# Patient Record
Sex: Female | Born: 1940
Health system: Southern US, Community
[De-identification: ages and names within clinical notes are randomized; demographics above are authoritative.]

## PROBLEM LIST (undated history)

## (undated) DIAGNOSIS — C4492 Squamous cell carcinoma of skin, unspecified: Secondary | ICD-10-CM

## (undated) DIAGNOSIS — K589 Irritable bowel syndrome without diarrhea: Secondary | ICD-10-CM

## (undated) DIAGNOSIS — N814 Uterovaginal prolapse, unspecified: Secondary | ICD-10-CM

## (undated) DIAGNOSIS — G629 Polyneuropathy, unspecified: Secondary | ICD-10-CM

## (undated) DIAGNOSIS — I4891 Unspecified atrial fibrillation: Secondary | ICD-10-CM

## (undated) DIAGNOSIS — T7840XA Allergy, unspecified, initial encounter: Secondary | ICD-10-CM

## (undated) DIAGNOSIS — H269 Unspecified cataract: Secondary | ICD-10-CM

## (undated) DIAGNOSIS — M1990 Unspecified osteoarthritis, unspecified site: Secondary | ICD-10-CM

## (undated) DIAGNOSIS — D229 Melanocytic nevi, unspecified: Secondary | ICD-10-CM

## (undated) DIAGNOSIS — E785 Hyperlipidemia, unspecified: Secondary | ICD-10-CM

## (undated) DIAGNOSIS — IMO0001 Reserved for inherently not codable concepts without codable children: Secondary | ICD-10-CM

## (undated) DIAGNOSIS — R896 Abnormal cytological findings in specimens from other organs, systems and tissues: Secondary | ICD-10-CM

## (undated) DIAGNOSIS — N289 Disorder of kidney and ureter, unspecified: Secondary | ICD-10-CM

## (undated) DIAGNOSIS — C801 Malignant (primary) neoplasm, unspecified: Secondary | ICD-10-CM

## (undated) DIAGNOSIS — K219 Gastro-esophageal reflux disease without esophagitis: Secondary | ICD-10-CM

## (undated) DIAGNOSIS — K449 Diaphragmatic hernia without obstruction or gangrene: Secondary | ICD-10-CM

## (undated) DIAGNOSIS — K5790 Diverticulosis of intestine, part unspecified, without perforation or abscess without bleeding: Secondary | ICD-10-CM

## (undated) DIAGNOSIS — I10 Essential (primary) hypertension: Secondary | ICD-10-CM

## (undated) DIAGNOSIS — K52832 Lymphocytic colitis: Secondary | ICD-10-CM

## (undated) HISTORY — DX: Essential (primary) hypertension: I10

## (undated) HISTORY — DX: Uterovaginal prolapse, unspecified: N81.4

## (undated) HISTORY — DX: Allergy, unspecified, initial encounter: T78.40XA

## (undated) HISTORY — PX: BREAST BIOPSY: SHX20

## (undated) HISTORY — DX: Malignant (primary) neoplasm, unspecified: C80.1

## (undated) HISTORY — DX: Hyperlipidemia, unspecified: E78.5

## (undated) HISTORY — DX: Reserved for inherently not codable concepts without codable children: IMO0001

## (undated) HISTORY — DX: Unspecified cataract: H26.9

## (undated) HISTORY — DX: Unspecified osteoarthritis, unspecified site: M19.90

## (undated) HISTORY — DX: Lymphocytic colitis: K52.832

## (undated) HISTORY — DX: Unspecified atrial fibrillation: I48.91

## (undated) HISTORY — DX: Disorder of kidney and ureter, unspecified: N28.9

## (undated) HISTORY — DX: Gastro-esophageal reflux disease without esophagitis: K21.9

## (undated) HISTORY — DX: Diverticulosis of intestine, part unspecified, without perforation or abscess without bleeding: K57.90

## (undated) HISTORY — DX: Diaphragmatic hernia without obstruction or gangrene: K44.9

## (undated) HISTORY — DX: Polyneuropathy, unspecified: G62.9

## (undated) HISTORY — DX: Irritable bowel syndrome, unspecified: K58.9

## (undated) HISTORY — DX: Abnormal cytological findings in specimens from other organs, systems and tissues: R89.6

## (undated) HISTORY — PX: CATARACT EXTRACTION W/ INTRAOCULAR LENS IMPLANT: SHX1309

---
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
## 1998-08-23 ENCOUNTER — Other Ambulatory Visit: Admission: RE | Admit: 1998-08-23 | Discharge: 1998-08-23 | Payer: Self-pay | Admitting: Gynecology

## 2000-05-30 ENCOUNTER — Encounter: Payer: Self-pay | Admitting: Gynecology

## 2000-05-30 ENCOUNTER — Encounter: Admission: RE | Admit: 2000-05-30 | Discharge: 2000-05-30 | Payer: Self-pay | Admitting: Gynecology

## 2002-02-10 ENCOUNTER — Encounter: Payer: Self-pay | Admitting: Gynecology

## 2002-02-10 ENCOUNTER — Encounter: Admission: RE | Admit: 2002-02-10 | Discharge: 2002-02-10 | Payer: Self-pay | Admitting: Gynecology

## 2002-02-24 DIAGNOSIS — C4492 Squamous cell carcinoma of skin, unspecified: Secondary | ICD-10-CM

## 2002-02-24 HISTORY — DX: Squamous cell carcinoma of skin, unspecified: C44.92

## 2002-03-17 ENCOUNTER — Other Ambulatory Visit: Admission: RE | Admit: 2002-03-17 | Discharge: 2002-03-17 | Payer: Self-pay | Admitting: Gynecology

## 2002-08-12 ENCOUNTER — Encounter: Payer: Self-pay | Admitting: Gynecology

## 2002-08-12 ENCOUNTER — Encounter: Admission: RE | Admit: 2002-08-12 | Discharge: 2002-08-12 | Payer: Self-pay | Admitting: Gynecology

## 2003-04-01 ENCOUNTER — Other Ambulatory Visit: Admission: RE | Admit: 2003-04-01 | Discharge: 2003-04-01 | Payer: Self-pay | Admitting: Gynecology

## 2003-04-01 ENCOUNTER — Encounter: Payer: Self-pay | Admitting: Gynecology

## 2003-04-01 ENCOUNTER — Encounter: Admission: RE | Admit: 2003-04-01 | Discharge: 2003-04-01 | Payer: Self-pay | Admitting: Gynecology

## 2003-12-03 ENCOUNTER — Ambulatory Visit (HOSPITAL_COMMUNITY): Admission: RE | Admit: 2003-12-03 | Discharge: 2003-12-03 | Payer: Self-pay | Admitting: Family Medicine

## 2004-04-07 ENCOUNTER — Other Ambulatory Visit: Admission: RE | Admit: 2004-04-07 | Discharge: 2004-04-07 | Payer: Self-pay | Admitting: Gynecology

## 2004-04-07 ENCOUNTER — Encounter: Admission: RE | Admit: 2004-04-07 | Discharge: 2004-04-07 | Payer: Self-pay | Admitting: Gynecology

## 2004-05-09 ENCOUNTER — Encounter: Admission: RE | Admit: 2004-05-09 | Discharge: 2004-05-09 | Payer: Self-pay

## 2004-07-20 ENCOUNTER — Other Ambulatory Visit: Admission: RE | Admit: 2004-07-20 | Discharge: 2004-07-20 | Payer: Self-pay | Admitting: Gynecology

## 2005-03-23 ENCOUNTER — Other Ambulatory Visit: Admission: RE | Admit: 2005-03-23 | Discharge: 2005-03-23 | Payer: Self-pay | Admitting: Gynecology

## 2005-05-24 ENCOUNTER — Ambulatory Visit (HOSPITAL_COMMUNITY): Admission: RE | Admit: 2005-05-24 | Discharge: 2005-05-24 | Payer: Self-pay | Admitting: Gynecology

## 2005-08-28 DIAGNOSIS — K52832 Lymphocytic colitis: Secondary | ICD-10-CM

## 2005-08-28 HISTORY — DX: Lymphocytic colitis: K52.832

## 2005-09-20 ENCOUNTER — Other Ambulatory Visit: Admission: RE | Admit: 2005-09-20 | Discharge: 2005-09-20 | Payer: Self-pay | Admitting: Gynecology

## 2005-12-08 ENCOUNTER — Ambulatory Visit: Payer: Self-pay | Admitting: Gastroenterology

## 2005-12-29 ENCOUNTER — Ambulatory Visit: Payer: Self-pay | Admitting: Gastroenterology

## 2006-01-01 ENCOUNTER — Ambulatory Visit: Payer: Self-pay | Admitting: Gastroenterology

## 2006-02-07 ENCOUNTER — Ambulatory Visit: Payer: Self-pay | Admitting: Gastroenterology

## 2006-06-26 ENCOUNTER — Other Ambulatory Visit: Admission: RE | Admit: 2006-06-26 | Discharge: 2006-06-26 | Payer: Self-pay | Admitting: Gynecology

## 2006-06-26 ENCOUNTER — Ambulatory Visit (HOSPITAL_COMMUNITY): Admission: RE | Admit: 2006-06-26 | Discharge: 2006-06-26 | Payer: Self-pay | Admitting: Gynecology

## 2006-12-03 ENCOUNTER — Other Ambulatory Visit: Admission: RE | Admit: 2006-12-03 | Discharge: 2006-12-03 | Payer: Self-pay | Admitting: Gynecology

## 2007-05-02 DIAGNOSIS — D229 Melanocytic nevi, unspecified: Secondary | ICD-10-CM

## 2007-05-02 HISTORY — DX: Melanocytic nevi, unspecified: D22.9

## 2007-08-14 ENCOUNTER — Ambulatory Visit (HOSPITAL_COMMUNITY): Admission: RE | Admit: 2007-08-14 | Discharge: 2007-08-14 | Payer: Self-pay | Admitting: Gynecology

## 2008-12-28 ENCOUNTER — Telehealth: Payer: Self-pay | Admitting: Gastroenterology

## 2009-01-06 ENCOUNTER — Ambulatory Visit (HOSPITAL_COMMUNITY): Admission: RE | Admit: 2009-01-06 | Discharge: 2009-01-06 | Payer: Self-pay | Admitting: Gynecology

## 2010-02-21 ENCOUNTER — Ambulatory Visit (HOSPITAL_COMMUNITY): Admission: RE | Admit: 2010-02-21 | Discharge: 2010-02-21 | Payer: Self-pay | Admitting: Family Medicine

## 2010-09-29 NOTE — Progress Notes (Signed)
 Summary: REcall Colon  Phone Note From Other Clinic   Call For:   Caller: [facility] [phone] Call For: DR Quantasia Stegner Summary of Call: When is Recall Colon due? Chart requested from central files. Initial call taken by: Dyane Lerner [facility],  Dec 28, 2008 9:10 AM  Follow-up for Phone Call        left message for frances to call back  Follow-up by: Leisha Kowalk CMA,  Dec 28, 2008 11:54 AM  Additional Follow-up for Phone Call Additional follow up Details #1::        Dr. Jakie reviewed the chart and would like the patient to have an office visit to decide if she needs colon Additional Follow-up by: Chick Cardinal CMA,  Dec 28, 2008 1:25 PM    Additional Follow-up for Phone Call Additional follow up Details #2::    advised Vana that I will call the patient and make an appt. I called the patient and asked for her to make an appt, and she said she would call back to make one.  Follow-up by: Leisha Kowalk CMA,  Dec 28, 2008 3:59 PM

## 2011-04-21 ENCOUNTER — Other Ambulatory Visit: Payer: Self-pay | Admitting: Gynecology

## 2011-05-29 ENCOUNTER — Encounter: Payer: Self-pay | Admitting: Gastroenterology

## 2011-06-27 ENCOUNTER — Encounter: Payer: Self-pay | Admitting: Gastroenterology

## 2011-06-27 ENCOUNTER — Ambulatory Visit (INDEPENDENT_AMBULATORY_CARE_PROVIDER_SITE_OTHER): Payer: MEDICARE | Admitting: Gastroenterology

## 2011-06-27 DIAGNOSIS — K5900 Constipation, unspecified: Secondary | ICD-10-CM

## 2011-06-27 DIAGNOSIS — R1310 Dysphagia, unspecified: Secondary | ICD-10-CM

## 2011-06-27 NOTE — Progress Notes (Signed)
History of Present Illness:  This is a 70 year old Caucasian female who has chronic gas and bloating and progressive constipation without melena or hematochezia. She complains of increasing acid reflux with intermittent solid food dysphagia. Last endoscopy and colonoscopy 2007. Actually that time she had refractory diarrhea and a diagnosis of lymphocytic colitis. She denies hepatobiliary or systemic complaints. She takes Zantac 150 mg a day for gas and bloating. For constipation she uses stool softeners, Metamucil, and when necessary milk of magnesia. He has vague abdominal discomfort associated with gas and bloating. I have reviewed this patient's present history, medical and surgical past history, allergies and medications.     ROS: The remainder of the 10 point ROS is negative     Physical Exam: General well developed well nourished patient in no acute distress, appearing their stated age Eyes PERRLA, no icterus, fundoscopic exam per opthamologist Skin no lesions noted Neck supple, no adenopathy, no thyroid enlargement, no tenderness Chest clear to percussion and auscultation Heart no significant murmurs, gallops or rubs noted Abdomen no hepatosplenomegaly masses or tenderness, BS normal.  Extremities no acute joint lesions, edema, phlebitis or evidence of cellulitis. Neurologic patient oriented x 3, cranial nerves intact, no focal neurologic deficits noted. Psychological mental status normal and normal affect.  Assessment and plan: Constipation predominant IBS, and chronic GERD with probable peptic stricture the esophagus. She is due for followup colonoscopy exam which has been scheduled at her convenience. I reviewed her constipation regime with this patient and encourage liberal by mouth fluids. Because of her dysphagia we will proceed with endoscopy and possible dilation. I have changed her to Nexium 40 mg a day. I have no labs on record to review. Encounter Diagnoses  Name Primary?    . Constipation   . Dysphagia

## 2011-06-27 NOTE — Patient Instructions (Addendum)
Your Colonoscopy/Endoscopy is scheduled on 07/10/2011 at 11am We are giving you Nexium samples today in place of Zantac  Colonoscopy A colonoscopy is an exam to evaluate your entire colon. In this exam, your colon is cleansed. A long fiberoptic tube is inserted through your rectum and into your colon. The fiberoptic scope (endoscope) is a long bundle of enclosed and very flexible fibers. These fibers transmit light to the area examined and send images from that area to your caregiver. Discomfort is usually minimal. You may be given a drug to help you sleep (sedative) during or prior to the procedure. This exam helps to detect lumps (tumors), polyps, inflammation, and areas of bleeding. Your caregiver may also take a small piece of tissue (biopsy) that will be examined under a microscope. LET YOUR CAREGIVER KNOW ABOUT:   Allergies to food or medicine.   Medicines taken, including vitamins, herbs, eyedrops, over-the-counter medicines, and creams.   Use of steroids (by mouth or creams).   Previous problems with anesthetics or numbing medicines.   History of bleeding problems or blood clots.   Previous surgery.   Other health problems, including diabetes and kidney problems.   Possibility of pregnancy, if this applies.  BEFORE THE PROCEDURE   A clear liquid diet may be required for 2 days before the exam.   Ask your caregiver about changing or stopping your regular medications.   Liquid injections (enemas) or laxatives may be required.   A large amount of electrolyte solution may be given to you to drink over a short period of time. This solution is used to clean out your colon.   You should be present 60 minutes prior to your procedure or as directed by your caregiver.  AFTER THE PROCEDURE   If you received a sedative or pain relieving medication, you will need to arrange for someone to drive you home.   Occasionally, there is a little blood passed with the first bowel movement. Do  not be concerned.  FINDING OUT THE RESULTS OF YOUR TEST Not all test results are available during your visit. If your test results are not back during the visit, make an appointment with your caregiver to find out the results. Do not assume everything is normal if you have not heard from your caregiver or the medical facility. It is important for you to follow up on all of your test results. HOME CARE INSTRUCTIONS   It is not unusual to pass moderate amounts of gas and experience mild abdominal cramping following the procedure. This is due to air being used to inflate your colon during the exam. Walking or a warm pack on your belly (abdomen) may help.   You may resume all normal meals and activities after sedatives and medicines have worn off.   Only take over-the-counter or prescription medicines for pain, discomfort, or fever as directed by your caregiver. Do not use aspirin or blood thinners if a biopsy was taken. Consult your caregiver for medicine usage if biopsies were taken.  SEEK IMMEDIATE MEDICAL CARE IF:   You have a fever.   You pass large blood clots or fill a toilet with blood following the procedure. This may also occur 10 to 14 days following the procedure. This is more likely if a biopsy was taken.   You develop abdominal pain that keeps getting worse and cannot be relieved with medicine.  Document Released: 08/11/2000 Document Revised: 04/26/2011 Document Reviewed: 03/26/2008 [facility] Patient Information 2012 [place], [place].

## 2011-07-03 ENCOUNTER — Other Ambulatory Visit: Payer: Self-pay | Admitting: Gastroenterology

## 2011-07-03 MED ORDER — PEG-KCL-NACL-NASULF-NA ASC-C 100 G PO SOLR: 1.0000 | Freq: Once | ORAL | Status: DC

## 2011-07-03 NOTE — Telephone Encounter (Signed)
Pt reports no one called in her prep. She reports she has all the instructions; sent script to [facility] in [place].

## 2011-07-10 ENCOUNTER — Ambulatory Visit (AMBULATORY_SURGERY_CENTER): Payer: Medicare Other | Admitting: Gastroenterology

## 2011-07-10 ENCOUNTER — Encounter: Payer: Self-pay | Admitting: Gastroenterology

## 2011-07-10 DIAGNOSIS — K5730 Diverticulosis of large intestine without perforation or abscess without bleeding: Secondary | ICD-10-CM | POA: Insufficient documentation

## 2011-07-10 DIAGNOSIS — Z1211 Encounter for screening for malignant neoplasm of colon: Secondary | ICD-10-CM | POA: Insufficient documentation

## 2011-07-10 DIAGNOSIS — K219 Gastro-esophageal reflux disease without esophagitis: Secondary | ICD-10-CM | POA: Insufficient documentation

## 2011-07-10 DIAGNOSIS — K222 Esophageal obstruction: Secondary | ICD-10-CM

## 2011-07-10 DIAGNOSIS — K5900 Constipation, unspecified: Secondary | ICD-10-CM | POA: Insufficient documentation

## 2011-07-10 DIAGNOSIS — R1310 Dysphagia, unspecified: Secondary | ICD-10-CM | POA: Insufficient documentation

## 2011-07-10 MED ORDER — SODIUM CHLORIDE 0.9 % IV SOLN: 500.0000 mL | INTRAVENOUS | Status: DC

## 2011-07-10 NOTE — Patient Instructions (Signed)
Please follow discharge instructions given today. See handouts. Try to follow high fiber diet with liberal fluid diet. Should use a fiber supplement as directed (metamucil or benefiber). Resume current medications. Call us with any questions or concerns. We will call you tomorrow to check on you.

## 2011-07-11 ENCOUNTER — Telehealth: Payer: Self-pay | Admitting: *Deleted

## 2011-07-11 NOTE — Telephone Encounter (Signed)

## 2012-01-02 ENCOUNTER — Other Ambulatory Visit (HOSPITAL_COMMUNITY): Payer: Self-pay | Admitting: Gynecology

## 2012-01-02 DIAGNOSIS — Z1231 Encounter for screening mammogram for malignant neoplasm of breast: Secondary | ICD-10-CM

## 2012-04-03 ENCOUNTER — Other Ambulatory Visit (HOSPITAL_COMMUNITY): Payer: Self-pay | Admitting: Gynecology

## 2012-04-03 DIAGNOSIS — Z1231 Encounter for screening mammogram for malignant neoplasm of breast: Secondary | ICD-10-CM

## 2012-04-23 ENCOUNTER — Other Ambulatory Visit: Payer: Self-pay | Admitting: Gynecology

## 2012-04-23 ENCOUNTER — Ambulatory Visit (HOSPITAL_COMMUNITY)
Admission: RE | Admit: 2012-04-23 | Discharge: 2012-04-23 | Disposition: A | Discharge status: Home / Self Care | Payer: Medicare Other | Source: Ambulatory Visit | Attending: Gynecology | Admitting: Gynecology

## 2012-04-23 DIAGNOSIS — Z1231 Encounter for screening mammogram for malignant neoplasm of breast: Secondary | ICD-10-CM | POA: Insufficient documentation

## 2012-10-28 ENCOUNTER — Telehealth: Payer: Self-pay | Admitting: Gastroenterology

## 2012-10-28 NOTE — Telephone Encounter (Signed)
Pt reports diarrhea x 3 weeks, she kept thinking it would go away, but it hasn't. Normally, she has to take something to have a BM. She states she had the same problem in 2007 and she states she took Entocort.  Last ECL 07/10/11 showing GERD and COLON moderate diverticulosis and melanosis. Pt denies being on an AB, reports no blood in her stool or fever, but she does report cramping and urgency. A week ago, Sunday, she states the watery stool was "black looking". Pt given an appt with Mike Gip, PA for 10/30/12 d/t the weather forecast per pt.

## 2012-10-30 ENCOUNTER — Encounter: Payer: Self-pay | Admitting: Physician Assistant

## 2012-10-30 ENCOUNTER — Other Ambulatory Visit: Payer: Medicare Other

## 2012-10-30 ENCOUNTER — Ambulatory Visit (INDEPENDENT_AMBULATORY_CARE_PROVIDER_SITE_OTHER): Payer: Medicare Other | Admitting: Physician Assistant

## 2012-10-30 VITALS — BP 100/64 | HR 88 | Ht 65.0 in | Wt 156.4 lb

## 2012-10-30 DIAGNOSIS — K625 Hemorrhage of anus and rectum: Secondary | ICD-10-CM

## 2012-10-30 DIAGNOSIS — R197 Diarrhea, unspecified: Secondary | ICD-10-CM

## 2012-10-30 DIAGNOSIS — K52832 Lymphocytic colitis: Secondary | ICD-10-CM

## 2012-10-30 DIAGNOSIS — K52839 Microscopic colitis, unspecified: Secondary | ICD-10-CM | POA: Insufficient documentation

## 2012-10-30 DIAGNOSIS — K5289 Other specified noninfective gastroenteritis and colitis: Secondary | ICD-10-CM

## 2012-10-30 DIAGNOSIS — E785 Hyperlipidemia, unspecified: Secondary | ICD-10-CM | POA: Insufficient documentation

## 2012-10-30 MED ORDER — BUDESONIDE 3 MG PO CP24: ORAL_CAPSULE | ORAL | Status: DC

## 2012-10-30 MED ORDER — PREDNISONE 20 MG PO TABS: ORAL_TABLET | ORAL | Status: DC

## 2012-10-30 NOTE — Progress Notes (Signed)
Subjective:    Patient ID: Kimberly Suarez, female    DOB: August 06, 1941, 72 y.o.   MRN: [id number]  HPI Kimberly Suarez is a very nice 72 year old white female known to Dr. Jarold Motto who has been followed for GERD, constipation predominant IBS and diverticulosis. She also has history of lymphocytic colitis which was documented and treated in 2007 . She responded well to Entocort at that time She comes to the office now complaining of onset of diarrhea about 3 weeks ago. She says her symptoms have not been progressive but are persistent. She's having one to 2  Watery, malodorous, greenish stools per day. She has had urgency and mild cramping just before bowel movements. Her appetite has been fair but she has lost about 8-10 pounds. She has had some vague nausea but no vomiting and no fevers. Her energy level has definitely been decreased. She's not been not any new medications, supplements or  vitamins and has not had any antibiotics in many many months. No other family members have been ill. Last colonoscopy was done in November of 2012 and showed moderate diverticulosis, melanosis coli, no polyps.    Review of Systems  Constitutional: Positive for appetite change, fatigue and unexpected weight change.  HENT: Negative.   Eyes: Negative.   Respiratory: Negative.   Cardiovascular: Negative.   Gastrointestinal: Positive for abdominal pain and diarrhea.  Endocrine: Negative.   Genitourinary: Negative.   Musculoskeletal: Negative.   Skin: Negative.   Allergic/Immunologic: Negative.   Neurological: Negative.   Hematological: Negative.   Psychiatric/Behavioral: Negative.    Outpatient Prescriptions Prior to Visit  Medication Sig Dispense Refill  . aspirin 81 MG tablet Take 81 mg by mouth daily.        Marland Kitchen atorvastatin (LIPITOR) 40 MG tablet Take 40 mg by mouth daily.        . Calcium Citrate (CITRACAL PO) Take 1 tablet by mouth daily.        . Cholecalciferol (VITAMIN D3) 1000 UNITS CAPS Take 1 capsule by  mouth daily.        Tery Sanfilippo Calcium (STOOL SOFTENER PO) Take by mouth as needed.        . enalapril (VASOTEC) 10 MG tablet Take 10 mg by mouth 2 (two) times daily.        Marland Kitchen esomeprazole (NEXIUM) 40 MG capsule Take 40 mg by mouth daily before breakfast.        . Glucosamine-Chondroitin (GLUCOSAMINE CHONDR COMPLEX PO) Take 1 tablet by mouth daily.        . hydrochlorothiazide (HYDRODIURIL) 25 MG tablet Take 25 mg by mouth daily.        . Omega-3 Fatty Acids (FISH OIL) 1000 MG CAPS Take 1 capsule by mouth daily.        . ranitidine (ZANTAC) 150 MG capsule Take 150 mg by mouth as needed.         No facility-administered medications prior to visit.   No Known Allergies Patient Active Problem List  Diagnosis  . Unspecified constipation  . Dysphagia, unspecified  . Diverticulosis of colon (without mention of hemorrhage)  . Special screening for malignant neoplasms, colon  . GERD with stricture  . Lymphocytic colitis  . Other and unspecified hyperlipidemia   History  Substance Use Topics  . Smoking status: Never Smoker   . Smokeless tobacco: Never Used  . Alcohol Use: No   family history includes Diabetes in her brother and Heart disease in her mother and unspecified family member.  There  is no history of Colon cancer, and Kidney disease, and Liver disease, .     Objective:   Physical Exam  well-developed older white female in no acute distress, pleasant blood pressure 100/64 pulse 88 height 5 foot 5 weight 156. HEENT; nontraumatic normocephalic EOMI PERRLA sclera anicteric,Neck; Supple no JVD, Cardiovascular, regular rate and rhythm with S1-S2 no murmur or gallop, Pulmonary; clear bilaterally, Abdomen; soft mildly tender bilaterally in the lower quadrants is no guarding or rebound no palpable mass or hepatosplenomegaly bowel sounds are active, Rectal; exam not done, Extremities; no clubbing, cyanosis, or edema skin warm and dry, Psych; mood and affect normal and appropriate.         Assessment & Plan:  #61  72 year old female with history of constipation predominant IBS and remote history of lymphocytic colitis diagnosed in 2007. Patient now presents with 3 week history of diarrhea and mild lower Donald cramp as well as associated weight loss and fatigue. Will rule out infectious colitis versus exacerbation of lymphocytic colitis. #2 GERD #3 diverticulosis  Plan; check stool for C. difficile by PCR, culture and O&P Start Prednisone 20 mg daily qam x 2 weeks then decrease to 10 mg daily- attempted to start Entocort but would cost pt 350.00 per month..... Offered an anti-spasmodic but she declines at this time Plan office followup with Dr. Jarold Motto or myself in 2 weeks

## 2012-10-30 NOTE — Patient Instructions (Addendum)
Please go to the basement level to the lab for stool studies. We will call you with the results. Take Prednisone 20 mg, Take 1 tab every morning for 14 days. Then take 1/2 tab daily until your visit with Dr. Jarold Motto.  We sent the prescription to [facility].   See Dr. Jarold Motto on 11-19-2012 at 1:45 PM.

## 2012-10-31 LAB — CLOSTRIDIUM DIFFICILE BY PCR: Toxigenic C. Difficile by PCR: NOT DETECTED

## 2012-10-31 LAB — OVA AND PARASITE SCREEN

## 2012-11-03 LAB — STOOL CULTURE

## 2012-11-14 ENCOUNTER — Telehealth: Payer: Self-pay | Admitting: Gastroenterology

## 2012-11-14 NOTE — Telephone Encounter (Signed)
Spoke with Mike Gip, PA because pt states she lives over an hour away and can't afford to come in if she doesn't have to. Amy instructed pt to continue with 10 mg Prednisone for a total of 2 weeks/14 days and then decrease to 5 mg for 14 days and then stop. If her s&s return at any time, she is to call in and she will have to be seen. lmom for pt to call back.

## 2012-11-14 NOTE — Telephone Encounter (Signed)
Pt states she felt better after the 3rd day of Prednisone. Does she have to come in?

## 2012-11-15 MED ORDER — PREDNISONE 10 MG PO TABS: ORAL_TABLET | ORAL | Status: DC

## 2012-11-15 NOTE — Telephone Encounter (Signed)
Informed pt of tapering dose, will order more prednisone, and she is to call if any s&s reoccur. Cancelled appt; pt stated understanding.

## 2012-11-18 ENCOUNTER — Other Ambulatory Visit: Payer: Self-pay | Admitting: *Deleted

## 2012-11-18 NOTE — Progress Notes (Signed)
Please ask pt to stay on 20 mg x 2 weeks total, then decrease to 15 mg daily x one week then 10 mg daily x one week then may stop if no sxs-if her sxs start coming back she needs to call and come back in to discuss a maintanence regimen   ----- Message -----   From: Linna Hoff, RN   Sent: 11/14/2012 10:56 AM   To: Sammuel Cooper, PA-C      Amy, you saw this lady on 10/30/12 and put her prednisone and wrote to f/u with Jarold Motto. Pt states she was better after 3 days and doesn't want to f/u, but I told her she would have to taper off the prednisone. Will you tell me how or should I make her come in? Thanks.

## 2012-11-19 ENCOUNTER — Ambulatory Visit: Payer: Medicare Other | Admitting: Gastroenterology

## 2012-12-02 ENCOUNTER — Other Ambulatory Visit (INDEPENDENT_AMBULATORY_CARE_PROVIDER_SITE_OTHER): Payer: Medicare Other

## 2012-12-02 DIAGNOSIS — R5383 Other fatigue: Secondary | ICD-10-CM

## 2012-12-02 DIAGNOSIS — R5381 Other malaise: Secondary | ICD-10-CM

## 2012-12-02 DIAGNOSIS — E785 Hyperlipidemia, unspecified: Secondary | ICD-10-CM

## 2012-12-02 DIAGNOSIS — I10 Essential (primary) hypertension: Secondary | ICD-10-CM

## 2012-12-02 DIAGNOSIS — R7989 Other specified abnormal findings of blood chemistry: Secondary | ICD-10-CM

## 2012-12-02 DIAGNOSIS — E559 Vitamin D deficiency, unspecified: Secondary | ICD-10-CM

## 2012-12-02 LAB — POCT CBC
Granulocyte percent: 51.3 % (ref 37–80)
HCT, POC: 39.6 % (ref 37.7–47.9)
Hemoglobin: 13.3 g/dL (ref 12.2–16.2)
Lymph, poc: 2.9 (ref 0.6–3.4)
MCH, POC: 30.3 pg (ref 27–31.2)
MCHC: 33.6 g/dL (ref 31.8–35.4)
MCV: 90.3 fL (ref 80–97)
MPV: 7.3 fL (ref 0–99.8)
POC Granulocyte: 3.6 (ref 2–6.9)
POC LYMPH PERCENT: 41 % (ref 10–50)
Platelet Count, POC: 177 10*3/uL (ref 142–424)
RBC: 4.4 M/uL (ref 4.04–5.48)
RDW, POC: 13.9 %
WBC: 7 10*3/uL (ref 4.6–10.2)

## 2012-12-02 LAB — HEPATIC FUNCTION PANEL
ALT: 21 U/L (ref 0–35)
AST: 28 U/L (ref 0–37)
Albumin: 3.8 g/dL (ref 3.5–5.2)
Alkaline Phosphatase: 31 U/L — ABNORMAL LOW (ref 39–117)
Bilirubin, Direct: 0.1 mg/dL (ref 0.0–0.3)
Indirect Bilirubin: 0.3 mg/dL (ref 0.0–0.9)
Total Bilirubin: 0.4 mg/dL (ref 0.3–1.2)
Total Protein: 6.2 g/dL (ref 6.0–8.3)

## 2012-12-02 LAB — BASIC METABOLIC PANEL WITH GFR
BUN: 19 mg/dL (ref 6–23)
CO2: 32 meq/L (ref 19–32)
Calcium: 9.2 mg/dL (ref 8.4–10.5)
Chloride: 101 meq/L (ref 96–112)
Creat: 1.28 mg/dL — ABNORMAL HIGH (ref 0.50–1.10)
Glucose, Bld: 99 mg/dL (ref 70–99)
Potassium: 3.7 meq/L (ref 3.5–5.3)
Sodium: 139 meq/L (ref 135–145)

## 2012-12-03 LAB — NMR LIPOPROFILE WITH LIPIDS
Cholesterol, Total: 138 mg/dL
HDL Particle Number: 33.3 umol/L
HDL Size: 9 nm — ABNORMAL LOW
HDL-C: 50 mg/dL
LDL (calc): 68 mg/dL
LDL Particle Number: 868 nmol/L
LDL Size: 20.7 nm
LP-IR Score: 47 — ABNORMAL HIGH
Large HDL-P: 6.8 umol/L
Large VLDL-P: 2.3 nmol/L
Small LDL Particle Number: 328 nmol/L
Triglycerides: 102 mg/dL
VLDL Size: 49.2 nm — ABNORMAL HIGH

## 2012-12-03 LAB — VITAMIN D 25 HYDROXY (VIT D DEFICIENCY, FRACTURES): Vit D, 25-Hydroxy: 51 ng/mL (ref 30–89)

## 2012-12-04 NOTE — Progress Notes (Signed)
Patients came in for labs only

## 2012-12-09 ENCOUNTER — Ambulatory Visit: Payer: Self-pay | Admitting: Family Medicine

## 2013-01-01 ENCOUNTER — Encounter: Payer: Self-pay | Admitting: Family Medicine

## 2013-01-01 ENCOUNTER — Ambulatory Visit (INDEPENDENT_AMBULATORY_CARE_PROVIDER_SITE_OTHER): Payer: Medicare Other | Admitting: Family Medicine

## 2013-01-01 VITALS — BP 100/61 | HR 67 | Temp 97.2°F | Ht 63.5 in | Wt 156.8 lb

## 2013-01-01 DIAGNOSIS — K52832 Lymphocytic colitis: Secondary | ICD-10-CM

## 2013-01-01 DIAGNOSIS — E785 Hyperlipidemia, unspecified: Secondary | ICD-10-CM

## 2013-01-01 DIAGNOSIS — K5289 Other specified noninfective gastroenteritis and colitis: Secondary | ICD-10-CM

## 2013-01-01 DIAGNOSIS — K5900 Constipation, unspecified: Secondary | ICD-10-CM

## 2013-01-01 NOTE — Progress Notes (Signed)
  Subjective:    Patient ID: Kimberly Suarez, female    DOB: 07-Oct-1940, 72 y.o.   MRN: [id number]  HPI This patient presents for recheck of multiple medical problems. No one accompanies the patient today.  Patient Active Problem List   Diagnosis Date Noted  . Lymphocytic colitis 10/30/2012  . Other and unspecified hyperlipidemia 10/30/2012  . Unspecified constipation 07/10/2011  . Dysphagia, unspecified 07/10/2011  . Diverticulosis of colon (without mention of hemorrhage) 07/10/2011  . Special screening for malignant neoplasms, colon 07/10/2011  . GERD with stricture 07/10/2011    In addition, See Ros  The allergies, current medications, past medical history, surgical history, family and social history are reviewed.  Immunizations reviewed.  Health maintenance reviewed.  The following items are outstanding: None.      Review of Systems  Constitutional: Negative.   HENT: Positive for postnasal drip.   Eyes: Positive for itching (due to dryness).  Respiratory: Negative.   Cardiovascular: Negative.   Gastrointestinal: Positive for abdominal distention (bloating at night).  Genitourinary: Negative.   Musculoskeletal: Positive for back pain (LBP occasional) and arthralgias (knees).  Allergic/Immunologic: Positive for environmental allergies (seasonal).  Psychiatric/Behavioral: Positive for sleep disturbance (2-3 x week).   Recent labs were reviewed with patient.    Objective:   Physical Exam BP 100/61  Pulse 67  Temp(Src) 97.2 F (36.2 C) (Oral)  Ht 5' 3.5" (1.613 m)  Wt 156 lb 12.8 oz (71.124 kg)  BMI 27.34 kg/m2  The patient appeared well nourished and normally developed, alert and oriented to time and place. Speech, behavior and judgement appear normal. Vital signs as documented.  Head exam is unremarkable. No scleral icterus or pallor noted. Nasal congestion bilaterally left greater than right. Mouth normal. Throat normal.  Neck is without jugular venous  distension, thyromegally, or carotid bruits. Carotid upstrokes are brisk bilaterally. No cervical adenopathy. Lungs are clear anteriorly and posteriorly to auscultation. Normal respiratory effort. Cardiac exam reveals regular rate and rhythm at 60 per minute .First and second heart sounds normal.  No murmurs, rubs or gallops.  Abdominal exam reveals normal bowl sounds, no masses, no organomegaly and no aortic enlargement. No inguinal adenopathy. Slight epigastric tenderness. Extremities are nonedematous and both femoral and pedal pulses are normal. Skin without pallor or jaundice.  Warm and dry, without rash. Neurologic exam reveals normal deep tendon reflexes and normal sensation.          Assessment & Plan:  hyperlipidemia  Lymphocytic colitis  Unspecified constipation  Patient Instructions  Try Nasacort AQ over-the-counter 1-2 sprays each nostril one time daily Continue current meds as directed Always drink plenty of fluids Protect airways and possible from environmental allergen Dr. Scheryl Darter is the gynecologist that comes and sees patient's next door, his number is [phone]

## 2013-01-01 NOTE — Patient Instructions (Addendum)
Try Nasacort AQ over-the-counter 1-2 sprays each nostril one time daily Continue current meds as directed Always drink plenty of fluids Protect airways and possible from environmental allergen Dr. Scheryl Darter is the gynecologist that comes and sees patient's next door, his number is [phone]

## 2013-06-12 ENCOUNTER — Ambulatory Visit: Payer: Medicare Other | Admitting: Gynecology

## 2013-06-18 ENCOUNTER — Ambulatory Visit: Payer: Medicare Other | Admitting: Gynecology

## 2013-06-26 ENCOUNTER — Encounter: Payer: Self-pay | Admitting: Family Medicine

## 2013-06-26 ENCOUNTER — Ambulatory Visit (INDEPENDENT_AMBULATORY_CARE_PROVIDER_SITE_OTHER): Payer: Medicare Other

## 2013-06-26 ENCOUNTER — Encounter (INDEPENDENT_AMBULATORY_CARE_PROVIDER_SITE_OTHER): Payer: Self-pay

## 2013-06-26 ENCOUNTER — Ambulatory Visit (INDEPENDENT_AMBULATORY_CARE_PROVIDER_SITE_OTHER): Payer: Medicare Other | Admitting: Family Medicine

## 2013-06-26 VITALS — BP 114/67 | HR 69 | Temp 97.9°F | Ht 63.5 in | Wt 156.0 lb

## 2013-06-26 DIAGNOSIS — K219 Gastro-esophageal reflux disease without esophagitis: Secondary | ICD-10-CM

## 2013-06-26 DIAGNOSIS — Z780 Asymptomatic menopausal state: Secondary | ICD-10-CM

## 2013-06-26 DIAGNOSIS — E785 Hyperlipidemia, unspecified: Secondary | ICD-10-CM

## 2013-06-26 DIAGNOSIS — I10 Essential (primary) hypertension: Secondary | ICD-10-CM | POA: Insufficient documentation

## 2013-06-26 MED ORDER — HYDROCHLOROTHIAZIDE 25 MG PO TABS: 25.0000 mg | ORAL_TABLET | Freq: Every day | ORAL | Status: DC

## 2013-06-26 MED ORDER — ENALAPRIL MALEATE 10 MG PO TABS: 10.0000 mg | ORAL_TABLET | Freq: Two times a day (BID) | ORAL | Status: DC

## 2013-06-26 NOTE — Progress Notes (Signed)
Subjective:    Patient ID: Kimberly Suarez, female    DOB: March 14, 1941, 72 y.o.   MRN: [id number]  HPI Pt here for follow up and management of chronic medical problems. Patient complains today of burning in her feet especially at night time. She also complains of some cramping in the right leg. She is also having diarrhea and has seen the gastroenterologist about this in the past and she is currently taking a course of prednisone. The diagnosis for her diarrhea was lymphocytic colitis. She is also behind on getting her mammogram and pelvic exam. She will get a chest x-ray today and she gets her FOBT done with her GYN exam.     Patient Active Problem List   Diagnosis Date Noted  . Lymphocytic colitis 10/30/2012  . Other and unspecified hyperlipidemia 10/30/2012  . Unspecified constipation 07/10/2011  . Dysphagia, unspecified 07/10/2011  . Diverticulosis of colon (without mention of hemorrhage) 07/10/2011  . Special screening for malignant neoplasms, colon 07/10/2011  . GERD with stricture 07/10/2011   Outpatient Encounter Prescriptions as of 06/26/2013  Medication Sig Dispense Refill  . aspirin 81 MG tablet Take 81 mg by mouth daily.        Marland Kitchen atorvastatin (LIPITOR) 40 MG tablet Take 40 mg by mouth daily.        . Calcium Citrate (CITRACAL PO) Take 1 tablet by mouth daily.        . Cholecalciferol (VITAMIN D3) 1000 UNITS CAPS Take 1 capsule by mouth daily.        Tery Sanfilippo Calcium (STOOL SOFTENER PO) Take by mouth as needed.        . enalapril (VASOTEC) 10 MG tablet Take 10 mg by mouth 2 (two) times daily.        Marland Kitchen esomeprazole (NEXIUM) 40 MG capsule Take 40 mg by mouth daily before breakfast.        . Glucosamine-Chondroitin (GLUCOSAMINE CHONDR COMPLEX PO) Take 1 tablet by mouth 2 (two) times daily.       . hydrochlorothiazide (HYDRODIURIL) 25 MG tablet Take 25 mg by mouth daily.        . Omega-3 Fatty Acids (FISH OIL) 1000 MG CAPS Take 1 capsule by mouth 2 (two) times daily.       .  ranitidine (ZANTAC) 150 MG capsule Take 150 mg by mouth as needed.         No facility-administered encounter medications on file as of 06/26/2013.    Review of Systems  Constitutional: Negative.   HENT: Negative.   Eyes: Negative.   Respiratory: Negative.   Cardiovascular: Negative.   Gastrointestinal: Positive for diarrhea.  Endocrine: Negative.   Genitourinary: Negative.   Musculoskeletal: Negative.   Skin: Negative.   Allergic/Immunologic: Negative.   Neurological: Negative.   Hematological: Negative.   Psychiatric/Behavioral: Negative.        Objective:   Physical Exam  Nursing note and vitals reviewed. Constitutional: She is oriented to person, place, and time. She appears well-developed and well-nourished.  HENT:  Head: Normocephalic and atraumatic.  Right Ear: External ear normal.  Left Ear: External ear normal.  Nose: Nose normal.  Mouth/Throat: Oropharynx is clear and moist.  There is definitely some nasal congestion bilaterally  Eyes: Conjunctivae and EOM are normal. Pupils are equal, round, and reactive to light. Right eye exhibits no discharge. Left eye exhibits no discharge. No scleral icterus.  Neck: Normal range of motion. Neck supple. No JVD present. No thyromegaly present.  No bruits in the  neck  Cardiovascular: Normal rate, regular rhythm, normal heart sounds and intact distal pulses.  Exam reveals no gallop and no friction rub.   No murmur heard. At 84 per minute  Pulmonary/Chest: Effort normal and breath sounds normal. She has no wheezes. She has no rales. She exhibits no tenderness.  Abdominal: Soft. Bowel sounds are normal. She exhibits no mass. There is no tenderness. There is no rebound and no guarding.  Musculoskeletal: Normal range of motion. She exhibits no edema and no tenderness.  Leg raising is good bilaterally  Lymphadenopathy:    She has no cervical adenopathy.  Neurological: She is alert and oriented to person, place, and time. She has  normal reflexes. She displays normal reflexes. No cranial nerve deficit.  Skin: Skin is warm and dry. No rash noted.  Psychiatric: She has a normal mood and affect. Her behavior is normal. Judgment and thought content normal.   BP 114/67  Pulse 69  Temp(Src) 97.9 F (36.6 C) (Oral)  Ht 5' 3.5" (1.613 m)  Wt 156 lb (70.761 kg)  BMI 27.2 kg/m2        Assessment & Plan:   1. HTN (hypertension)   2. GERD with stricture   3. Other and unspecified hyperlipidemia   4. Postmenopausal    Orders Placed This Encounter  Procedures  . DG Bone Density    Wants asap, last was in 8/14    Standing Status: Future     Number of Occurrences:      Standing Expiration Date: 08/26/2014    Order Specific Question:  Reason for Exam (SYMPTOM  OR DIAGNOSIS REQUIRED)    Answer:  postmenapausal    Order Specific Question:  Preferred imaging location?    Answer:  Internal  . DG Chest 2 View    Standing Status: Future     Number of Occurrences:      Standing Expiration Date: 08/26/2014    Order Specific Question:  Reason for Exam (SYMPTOM  OR DIAGNOSIS REQUIRED)    Answer:  htn    Order Specific Question:  Preferred imaging location?    Answer:  Internal  . Hepatic function panel    Standing Status: Future     Number of Occurrences:      Standing Expiration Date: 06/26/2014  . BMP8+EGFR    Standing Status: Future     Number of Occurrences:      Standing Expiration Date: 06/26/2014  . Vit D  25 hydroxy (rtn osteoporosis monitoring)    Standing Status: Future     Number of Occurrences:      Standing Expiration Date: 06/26/2014  . NMR, lipoprofile    Standing Status: Future     Number of Occurrences:      Standing Expiration Date: 06/26/2014  . POCT CBC    Standing Status: Future     Number of Occurrences:      Standing Expiration Date: 07/27/2013   Meds ordered this encounter  Medications  . hydrochlorothiazide (HYDRODIURIL) 25 MG tablet    Sig: Take 1 tablet (25 mg total) by mouth  daily.    Dispense:  90 tablet    Refill:  1  . enalapril (VASOTEC) 10 MG tablet    Sig: Take 1 tablet (10 mg total) by mouth 2 (two) times daily.    Dispense:  180 tablet    Refill:  1   Patient Instructions  Continue to take medications as directed Do not forget to get your Pap smear and your  mammogram Return to clinic for your lab work Return to clinic for your flu shot and Prevnar shot You will return to clinic also for a DEXA scan. Always be careful and do not put yourself at risk for falling Drink plenty of fluid We will call you with the results of the chest x-ray and lab work once these results are available.    Nyra Capes MD

## 2013-06-26 NOTE — Patient Instructions (Signed)
Continue to take medications as directed Do not forget to get your Pap smear and your mammogram Return to clinic for your lab work Return to clinic for your flu shot and Prevnar shot You will return to clinic also for a DEXA scan. Always be careful and do not put yourself at risk for falling Drink plenty of fluid We will call you with the results of the chest x-ray and lab work once these results are available.

## 2013-06-27 ENCOUNTER — Other Ambulatory Visit: Payer: Self-pay | Admitting: Family Medicine

## 2013-06-27 DIAGNOSIS — Z1231 Encounter for screening mammogram for malignant neoplasm of breast: Secondary | ICD-10-CM

## 2013-06-30 ENCOUNTER — Ambulatory Visit: Payer: Medicare Other | Admitting: Gynecology

## 2013-07-02 ENCOUNTER — Ambulatory Visit (INDEPENDENT_AMBULATORY_CARE_PROVIDER_SITE_OTHER): Payer: Medicare Other | Admitting: Pharmacist

## 2013-07-02 ENCOUNTER — Ambulatory Visit (INDEPENDENT_AMBULATORY_CARE_PROVIDER_SITE_OTHER): Payer: Medicare Other

## 2013-07-02 ENCOUNTER — Encounter: Payer: Self-pay | Admitting: *Deleted

## 2013-07-02 ENCOUNTER — Encounter: Payer: Self-pay | Admitting: Pharmacist

## 2013-07-02 VITALS — Ht 64.0 in | Wt 148.0 lb

## 2013-07-02 DIAGNOSIS — K219 Gastro-esophageal reflux disease without esophagitis: Secondary | ICD-10-CM

## 2013-07-02 DIAGNOSIS — E785 Hyperlipidemia, unspecified: Secondary | ICD-10-CM

## 2013-07-02 DIAGNOSIS — Z23 Encounter for immunization: Secondary | ICD-10-CM

## 2013-07-02 DIAGNOSIS — Z13820 Encounter for screening for osteoporosis: Secondary | ICD-10-CM

## 2013-07-02 DIAGNOSIS — Z780 Asymptomatic menopausal state: Secondary | ICD-10-CM

## 2013-07-02 LAB — POCT CBC
Granulocyte percent: 72 % (ref 37–80)
HCT, POC: 44.1 % (ref 37.7–47.9)
Hemoglobin: 14.9 g/dL (ref 12.2–16.2)
Lymph, poc: 2.3 (ref 0.6–3.4)
MCH, POC: 30.6 pg (ref 27–31.2)
MCHC: 33.8 g/dL (ref 31.8–35.4)
MCV: 90.6 fL (ref 80–97)
MPV: 7.5 fL (ref 0–99.8)
POC Granulocyte: 6.5 (ref 2–6.9)
POC LYMPH PERCENT: 26.1 % (ref 10–50)
Platelet Count, POC: 222 10*3/uL (ref 142–424)
RBC: 4.9 M/uL (ref 4.04–5.48)
RDW, POC: 13.6 %
WBC: 9 10*3/uL (ref 4.6–10.2)

## 2013-07-02 NOTE — Patient Instructions (Signed)

## 2013-07-02 NOTE — Progress Notes (Signed)
Patient ID: Kimberly Suarez, female   DOB: 21-Feb-1941, 72 y.o.   MRN: [id number]   Osteoporosis Clinic Current Height: Height: 5\' 4"  (162.6 cm)      Max Lifetime Height:  5\' 4"  Current Weight: Weight: 148 lb (67.132 kg)       Ethnicity:Caucasian  BP:       HR:         HPI: Does pt already have a diagnosis of:  Osteopenia?  No Osteoporosis?  No  Back Pain?  No       Kyphosis?  No Prior fracture?  No Med(s) for Osteoporosis/Osteopenia:  none Med(s) previously tried for Osteoporosis/Osteopenia:  none                                                             PMH: Age at menopause:  72 yo Hysterectomy?  No Oophorectomy?  No HRT? Yes - Former.  Type/duration: 2 years Steroid Use?  No Thyroid med?  No History of cancer?  No History of digestive disorders (ie Crohn's)?  Yes - GERD Current or previous eating disorders?  No Last Vitamin D Result:  51 (12/02/2012) Last SCr Result:  1.28 (12/02/2012)   FH/SH: Family history of osteoporosis?  No Parent with history of hip fracture?  No Family history of breast cancer?  No Exercise?  Yes   Smoking?  No Alcohol?  No    Calcium Assessment Calcium Intake  # of servings/day  Calcium mg  Milk (8 oz) 1  x  300  = 300mg   Yogurt (4 oz) 1 x  200 = 200mg   Cheese (1 oz) 0 x  200 = 0  Other Calcium sources   250mg   Ca supplement 0 = 0   Estimated calcium intake per day 750mg     DEXA Results Date of Test T-Score for AP Spine L1-L4 T-Score for Total Left Hip T-Score for Total Right Hip  07/02/2013 1.0 0.8 1.0  04/12/2011 1.0 0.8 0.8  01/27/2009 0.7 0.7 0.8  06/11/2006 -0.1 0.7 0.5   Assessment: Normal BMD  Recommendations: 1.  Discussed results of DEXA and fracture risk 2.  continue calcium 1200mg  daily through supplementation or diet.  3.  continue weight bearing exercise - 30 minutes at least 4 days  per week.   4.  Counseled and educated about fall risk and prevention. 5.  Patient received influenza and pneumococcal vaccine  today. Recheck DEXA:  2 years  Time spent counseling patient:  10 minutes  Henrene Pastor, PharmD, CPP

## 2013-07-04 LAB — VITAMIN D 25 HYDROXY (VIT D DEFICIENCY, FRACTURES): Vit D, 25-Hydroxy: 50.8 ng/mL (ref 30.0–100.0)

## 2013-07-04 LAB — BMP8+EGFR
BUN/Creatinine Ratio: 15 (ref 11–26)
BUN: 23 mg/dL (ref 8–27)
CO2: 29 mmol/L (ref 18–29)
Calcium: 11.5 mg/dL — ABNORMAL HIGH (ref 8.6–10.2)
Chloride: 95 mmol/L — ABNORMAL LOW (ref 97–108)
Creatinine, Ser: 1.53 mg/dL — ABNORMAL HIGH (ref 0.57–1.00)
GFR calc Af Amer: 39 mL/min/{1.73_m2} — ABNORMAL LOW
GFR calc non Af Amer: 34 mL/min/{1.73_m2} — ABNORMAL LOW
Glucose: 104 mg/dL — ABNORMAL HIGH (ref 65–99)
Potassium: 4.6 mmol/L (ref 3.5–5.2)
Sodium: 142 mmol/L (ref 134–144)

## 2013-07-04 LAB — NMR, LIPOPROFILE
Cholesterol: 176 mg/dL
HDL Cholesterol by NMR: 54 mg/dL
HDL Particle Number: 35.8 umol/L
LDL Particle Number: 1892 nmol/L — ABNORMAL HIGH
LDL Size: 20.5 nm — ABNORMAL LOW
LDLC SERPL CALC-MCNC: 89 mg/dL
LP-IR Score: 47 — ABNORMAL HIGH
Small LDL Particle Number: 1080 nmol/L — ABNORMAL HIGH
Triglycerides by NMR: 167 mg/dL — ABNORMAL HIGH

## 2013-07-04 LAB — SPECIMEN STATUS REPORT

## 2013-07-04 LAB — HEPATIC FUNCTION PANEL
ALT: 21 [IU]/L (ref 0–32)
AST: 24 [IU]/L (ref 0–40)
Albumin: 4.4 g/dL (ref 3.5–4.8)
Alkaline Phosphatase: 49 [IU]/L (ref 39–117)
Bilirubin, Direct: 0.12 mg/dL (ref 0.00–0.40)
Total Bilirubin: 0.5 mg/dL (ref 0.0–1.2)
Total Protein: 7.1 g/dL (ref 6.0–8.5)

## 2013-07-10 ENCOUNTER — Other Ambulatory Visit (INDEPENDENT_AMBULATORY_CARE_PROVIDER_SITE_OTHER): Payer: Medicare Other

## 2013-07-10 DIAGNOSIS — R7989 Other specified abnormal findings of blood chemistry: Secondary | ICD-10-CM

## 2013-07-10 NOTE — Addendum Note (Signed)
Addended by: Prescott Gum on: 07/10/2013 10:25 AM   Modules accepted: Orders

## 2013-07-10 NOTE — Progress Notes (Signed)
Pt came in for labs only 

## 2013-07-11 LAB — BMP8+EGFR
BUN/Creatinine Ratio: 10 — ABNORMAL LOW (ref 11–26)
BUN: 14 mg/dL (ref 8–27)
CO2: 29 mmol/L (ref 18–29)
Calcium: 9.3 mg/dL (ref 8.6–10.2)
Chloride: 96 mmol/L — ABNORMAL LOW (ref 97–108)
Creatinine, Ser: 1.35 mg/dL — ABNORMAL HIGH (ref 0.57–1.00)
GFR calc Af Amer: 45 mL/min/{1.73_m2} — ABNORMAL LOW
GFR calc non Af Amer: 39 mL/min/{1.73_m2} — ABNORMAL LOW
Glucose: 86 mg/dL (ref 65–99)
Potassium: 3.8 mmol/L (ref 3.5–5.2)
Sodium: 141 mmol/L (ref 134–144)

## 2013-07-11 LAB — FECAL OCCULT BLOOD, IMMUNOCHEMICAL: Fecal Occult Bld: NEGATIVE

## 2013-07-15 ENCOUNTER — Encounter: Payer: Self-pay | Admitting: *Deleted

## 2013-07-15 ENCOUNTER — Encounter: Payer: Self-pay | Admitting: Gynecology

## 2013-07-15 ENCOUNTER — Ambulatory Visit (INDEPENDENT_AMBULATORY_CARE_PROVIDER_SITE_OTHER): Payer: Medicare Other | Admitting: Gynecology

## 2013-07-15 VITALS — BP 118/78 | Ht 63.0 in | Wt 159.0 lb

## 2013-07-15 DIAGNOSIS — N816 Rectocele: Secondary | ICD-10-CM

## 2013-07-15 DIAGNOSIS — N814 Uterovaginal prolapse, unspecified: Secondary | ICD-10-CM

## 2013-07-15 DIAGNOSIS — N952 Postmenopausal atrophic vaginitis: Secondary | ICD-10-CM

## 2013-07-15 DIAGNOSIS — N8111 Cystocele, midline: Secondary | ICD-10-CM

## 2013-07-15 NOTE — Progress Notes (Signed)
This is well below one year if Kimberly Suarez 20-Feb-1941 [id number]        72 y.o.  [id number] new patient for followup exam.  Former patient of Dr. Leota Sauers with several issues noted below.  Past medical history,surgical history, problem list, medications, allergies, family history and social history were all reviewed and documented in the EPIC chart.  ROS:  Performed and pertinent positives and negatives are included in the history, assessment and plan .  Exam: [name] assistant Filed Vitals:   07/15/13 1149  BP: 118/78  Height: 5\' 3"  (1.6 m)  Weight: 159 lb (72.122 kg)   General appearance  Normal Skin grossly normal Head/Neck normal with no cervical or supraclavicular adenopathy thyroid normal Lungs  clear Cardiac RR, without RMG Abdominal  soft, nontender, without masses, organomegaly or hernia Breasts  examined lying and sitting without masses, retractions, discharge or axillary adenopathy. Pelvic  Ext/BUS/vagina  atrophic changes with cervix at the introital opening. Second-degree cystocele. Mild rectocele.  Cervix  normal with atrophic changes  Uterus  anteverted axial, normal size, shape and contour, midline and mobile nontender   Adnexa  Without masses or tenderness    Anus and perineum  normal   Rectovaginal  normal sphincter tone without palpated masses or tenderness. Confirms mild rectocele   Assessment/Plan:  72 y.o. [id number] new patient for followup exam.   1. Pelvic relaxation with uterine prolapse/cystocele/mild rectocele. Uses ring pessary with good success of the past several years. Is able to remove it herself. No evidence of erosions or irritation on exam. Patient will continue to use her pessary. 2. Postmenopausal. No significant hot flushes night sweats. No vaginal bleeding. Patient does report any vaginal bleeding. 3. Atrophic genital changes. Uses Estrace cream once to twice weekly. Has good results with this and wants to continue. Issues of absorption with  possible systemic effects such as thrombosis breast cancer endometrial stimulation reviewed. Patient accepts and has a refill already but will call when she needs more. Again patient knows to call for any vaginal bleeding. 4. Mammography due now and I reminded her to schedule it and she agrees to do so. SBE monthly reviewed. 5. Pap smear 2013. No Pap smear done today. History of ASCUS 2011. Repeat showed ASCUS with negative high-risk HPV 2011. Pap smear 2012, 2013 normal. Plan repeat Pap smear next year a 2 year interval. 6. Colonoscopy 2012. Repeat at their recommended interval. 7. DEXA 2014 normal with previous DEXA 2012, 2010, 2007 all normal. Recommended repeat at 5 year interval. Increase calcium vitamin D reviewed. 8. Health maintenance. No blood work done as this is all done through her primary physician's office. Followup one year, sooner as needed.   Note: This document was prepared with digital dictation and possible smart phrase technology. Any transcriptional errors that result from this process are unintentional.   Dara Lords MD, 12:17 PM 07/15/2013

## 2013-07-15 NOTE — Progress Notes (Signed)
Quick Note:  Copy of labs sent to patient ______ 

## 2013-07-15 NOTE — Patient Instructions (Signed)
Follow up in one year for annual exam 

## 2013-07-16 LAB — URINALYSIS W MICROSCOPIC + REFLEX CULTURE
Bilirubin Urine: NEGATIVE
Casts: NONE SEEN
Crystals: NONE SEEN
Glucose, UA: NEGATIVE mg/dL
Hgb urine dipstick: NEGATIVE
Ketones, ur: NEGATIVE mg/dL
Nitrite: NEGATIVE
Protein, ur: NEGATIVE mg/dL
Specific Gravity, Urine: 1.008 (ref 1.005–1.030)
Urobilinogen, UA: 0.2 mg/dL (ref 0.0–1.0)
pH: 5.5 (ref 5.0–8.0)

## 2013-07-17 LAB — URINE CULTURE
Colony Count: NO GROWTH
Organism ID, Bacteria: NO GROWTH

## 2013-07-22 ENCOUNTER — Other Ambulatory Visit: Payer: Self-pay

## 2013-07-22 MED ORDER — HYDROCHLOROTHIAZIDE 25 MG PO TABS: 25.0000 mg | ORAL_TABLET | Freq: Every day | ORAL | Status: DC

## 2013-07-22 MED ORDER — ENALAPRIL MALEATE 10 MG PO TABS: 10.0000 mg | ORAL_TABLET | Freq: Two times a day (BID) | ORAL | Status: DC

## 2013-07-22 NOTE — Telephone Encounter (Signed)
Pt aware to pick up

## 2013-07-22 NOTE — Telephone Encounter (Signed)
Last seen 06/26/13  DWM  If approved print for mail order and route to nurse

## 2013-07-22 NOTE — Telephone Encounter (Signed)
Both of these prescriptions are okay 

## 2013-08-15 ENCOUNTER — Ambulatory Visit (HOSPITAL_COMMUNITY)
Admission: RE | Admit: 2013-08-15 | Discharge: 2013-08-15 | Disposition: A | Discharge status: Home / Self Care | Payer: Medicare Other | Source: Ambulatory Visit | Attending: Family Medicine | Admitting: Family Medicine

## 2013-08-15 DIAGNOSIS — Z1231 Encounter for screening mammogram for malignant neoplasm of breast: Secondary | ICD-10-CM | POA: Insufficient documentation

## 2013-09-01 ENCOUNTER — Other Ambulatory Visit: Payer: Self-pay | Admitting: Family Medicine

## 2013-09-01 DIAGNOSIS — R928 Other abnormal and inconclusive findings on diagnostic imaging of breast: Secondary | ICD-10-CM

## 2013-11-11 ENCOUNTER — Other Ambulatory Visit (INDEPENDENT_AMBULATORY_CARE_PROVIDER_SITE_OTHER): Payer: Medicare HMO

## 2013-11-11 DIAGNOSIS — E559 Vitamin D deficiency, unspecified: Secondary | ICD-10-CM

## 2013-11-11 DIAGNOSIS — I10 Essential (primary) hypertension: Secondary | ICD-10-CM

## 2013-11-11 DIAGNOSIS — E785 Hyperlipidemia, unspecified: Secondary | ICD-10-CM

## 2013-11-11 LAB — POCT CBC
Granulocyte percent: 38.1 % (ref 37–80)
HCT, POC: 39.7 % (ref 37.7–47.9)
Hemoglobin: 12.3 g/dL (ref 12.2–16.2)
Lymph, poc: 2 (ref 0.6–3.4)
MCH, POC: 28.2 pg (ref 27–31.2)
MCHC: 31 g/dL — AB (ref 31.8–35.4)
MCV: 91 fL (ref 80–97)
MPV: 8.1 fL (ref 0–99.8)
POC Granulocyte: 2.8 (ref 2–6.9)
POC LYMPH PERCENT: 38.1 % (ref 10–50)
Platelet Count, POC: 194 10*3/uL (ref 142–424)
RBC: 4.4 M/uL (ref 4.04–5.48)
RDW, POC: 13.4 %
WBC: 5.2 10*3/uL (ref 4.6–10.2)

## 2013-11-11 NOTE — Progress Notes (Signed)
PT CAME IN FOR LABS ONLY 

## 2013-11-13 LAB — NMR, LIPOPROFILE
Cholesterol: 141 mg/dL
HDL Cholesterol by NMR: 42 mg/dL
HDL Particle Number: 25.3 umol/L — ABNORMAL LOW
LDL Particle Number: 1006 nmol/L — ABNORMAL HIGH
LDL Size: 19.9 nm — ABNORMAL LOW
LDLC SERPL CALC-MCNC: 62 mg/dL
LP-IR Score: 49 — ABNORMAL HIGH
Small LDL Particle Number: 732 nmol/L — ABNORMAL HIGH
Triglycerides by NMR: 185 mg/dL — ABNORMAL HIGH

## 2013-11-13 LAB — VITAMIN D 25 HYDROXY (VIT D DEFICIENCY, FRACTURES): Vit D, 25-Hydroxy: 40.3 ng/mL (ref 30.0–100.0)

## 2013-11-13 LAB — HEPATIC FUNCTION PANEL
ALT: 10 [IU]/L (ref 0–32)
AST: 13 [IU]/L (ref 0–40)
Albumin: 3.8 g/dL (ref 3.5–4.8)
Alkaline Phosphatase: 45 [IU]/L (ref 39–117)
Bilirubin, Direct: 0.09 mg/dL (ref 0.00–0.40)
Total Bilirubin: 0.2 mg/dL (ref 0.0–1.2)
Total Protein: 6.5 g/dL (ref 6.0–8.5)

## 2013-11-13 LAB — BMP8+EGFR
BUN/Creatinine Ratio: 16 (ref 11–26)
BUN: 20 mg/dL (ref 8–27)
CO2: 27 mmol/L (ref 18–29)
Calcium: 9.5 mg/dL (ref 8.7–10.3)
Chloride: 101 mmol/L (ref 97–108)
Creatinine, Ser: 1.24 mg/dL — ABNORMAL HIGH (ref 0.57–1.00)
GFR calc Af Amer: 50 mL/min/{1.73_m2} — ABNORMAL LOW
GFR calc non Af Amer: 43 mL/min/{1.73_m2} — ABNORMAL LOW
Glucose: 122 mg/dL — ABNORMAL HIGH (ref 65–99)
Potassium: 3.5 mmol/L (ref 3.5–5.2)
Sodium: 142 mmol/L (ref 134–144)

## 2013-11-18 ENCOUNTER — Ambulatory Visit (INDEPENDENT_AMBULATORY_CARE_PROVIDER_SITE_OTHER): Payer: Medicare HMO | Admitting: Family Medicine

## 2013-11-18 ENCOUNTER — Encounter: Payer: Self-pay | Admitting: Family Medicine

## 2013-11-18 VITALS — BP 114/70 | HR 63 | Temp 97.5°F | Ht 63.0 in | Wt 156.0 lb

## 2013-11-18 DIAGNOSIS — IMO0002 Reserved for concepts with insufficient information to code with codable children: Secondary | ICD-10-CM

## 2013-11-18 DIAGNOSIS — M1712 Unilateral primary osteoarthritis, left knee: Secondary | ICD-10-CM | POA: Insufficient documentation

## 2013-11-18 DIAGNOSIS — E785 Hyperlipidemia, unspecified: Secondary | ICD-10-CM

## 2013-11-18 DIAGNOSIS — N289 Disorder of kidney and ureter, unspecified: Secondary | ICD-10-CM | POA: Insufficient documentation

## 2013-11-18 DIAGNOSIS — I10 Essential (primary) hypertension: Secondary | ICD-10-CM

## 2013-11-18 DIAGNOSIS — Z139 Encounter for screening, unspecified: Secondary | ICD-10-CM

## 2013-11-18 DIAGNOSIS — M1710 Unilateral primary osteoarthritis, unspecified knee: Secondary | ICD-10-CM

## 2013-11-18 DIAGNOSIS — K219 Gastro-esophageal reflux disease without esophagitis: Secondary | ICD-10-CM

## 2013-11-18 DIAGNOSIS — M25562 Pain in left knee: Secondary | ICD-10-CM

## 2013-11-18 DIAGNOSIS — M25569 Pain in unspecified knee: Secondary | ICD-10-CM

## 2013-11-18 DIAGNOSIS — K222 Esophageal obstruction: Principal | ICD-10-CM

## 2013-11-18 MED ORDER — OMEPRAZOLE 40 MG PO CPDR: 40.0000 mg | DELAYED_RELEASE_CAPSULE | Freq: Every day | ORAL | Status: DC

## 2013-11-18 MED ORDER — ATORVASTATIN CALCIUM 80 MG PO TABS: 80.0000 mg | ORAL_TABLET | Freq: Every day | ORAL | Status: DC

## 2013-11-18 MED ORDER — HYDROCHLOROTHIAZIDE 25 MG PO TABS: 25.0000 mg | ORAL_TABLET | Freq: Every day | ORAL | Status: DC

## 2013-11-18 MED ORDER — ENALAPRIL MALEATE 10 MG PO TABS: 10.0000 mg | ORAL_TABLET | Freq: Two times a day (BID) | ORAL | Status: DC

## 2013-11-18 NOTE — Patient Instructions (Addendum)
Medicare Annual Wellness Visit  [facility] and the medical providers at [facility] strive to bring you the best medical care.  In doing so we not only want to address your current medical conditions and concerns but also to detect new conditions early and prevent illness, disease and health-related problems.    Medicare offers a yearly Wellness Visit which allows our clinical staff to assess your need for preventative services including immunizations, lifestyle education, counseling to decrease risk of preventable diseases and screening for fall risk and other medical concerns.    This visit is provided free of charge (no copay) for all Medicare recipients. The clinical pharmacists at [facility] have begun to conduct these Wellness Visits which will also include a thorough review of all your medications.    As you primary medical provider recommend that you make an appointment for your Annual Wellness Visit if you have not done so already this year.  You may set up this appointment before you leave today or you may call back ([phone]) and schedule an appointment.  Please make sure when you call that you mention that you are scheduling your Annual Wellness Visit with the clinical pharmacist so that the appointment may be made for the proper length of time.     Continue current medications. Continue good therapeutic lifestyle changes which include good diet and exercise. Fall precautions discussed with patient. If an FOBT was given today- please return it to our front desk. If you are over 40 years old - you may need Prevnar 72 or the adult Pneumonia vaccine.  Continue to be extremely careful going up and down steps and with getting up from a sitting position and starting to walk. We will arrange for you a visit to see the orthopedist regarding your continued left knee pain Please keep your appointment with the ophthalmologist  once  this is arranged.

## 2013-11-18 NOTE — Progress Notes (Signed)
Subjective:    Patient ID: Kimberly Suarez, female    DOB: 02-20-1941, 73 y.o.   MRN: [id number]  HPI Pt here for follow up and management of chronic medical problems. The patient complains mostly of left  knee pain. She is getting her eye exam done next week. She does need to get a cardiogram.         Patient Active Problem List   Diagnosis Date Noted  . Hypertension 06/26/2013  . HTN (hypertension) 06/26/2013  . Lymphocytic colitis 10/30/2012  . Hyperlipidemia 10/30/2012  . Unspecified constipation 07/10/2011  . Dysphagia, unspecified 07/10/2011  . Diverticulosis of colon (without mention of hemorrhage) 07/10/2011  . Special screening for malignant neoplasms, colon 07/10/2011  . GERD with stricture 07/10/2011   Outpatient Encounter Prescriptions as of 11/18/2013  Medication Sig  . aspirin 81 MG tablet Take 81 mg by mouth daily.    Marland Kitchen atorvastatin (LIPITOR) 80 MG tablet Take 80 mg by mouth daily.  . Calcium Citrate (CITRACAL PO) Take 1 tablet by mouth daily.    . enalapril (VASOTEC) 10 MG tablet Take 1 tablet (10 mg total) by mouth 2 (two) times daily.  Marland Kitchen estradiol (ESTRACE) 0.1 MG/GM vaginal cream Place 1 Applicatorful vaginally at bedtime.  . Glucosamine-Chondroitin (GLUCOSAMINE CHONDR COMPLEX PO) Take 1 tablet by mouth 2 (two) times daily.   . hydrochlorothiazide (HYDRODIURIL) 25 MG tablet Take 1 tablet (25 mg total) by mouth daily.  . Omega-3 Fatty Acids (FISH OIL) 1000 MG CAPS Take 1 capsule by mouth 2 (two) times daily.   Marland Kitchen omeprazole (PRILOSEC) 40 MG capsule Take 40 mg by mouth daily.  . [DISCONTINUED] atorvastatin (LIPITOR) 40 MG tablet Take 40 mg by mouth daily.    . [DISCONTINUED] ranitidine (ZANTAC) 150 MG capsule Take 150 mg by mouth as needed.      Review of Systems  Constitutional: Negative.   HENT: Negative.   Eyes: Negative.   Respiratory: Negative.   Cardiovascular: Negative.   Gastrointestinal: Negative.   Endocrine: Negative.   Genitourinary:  Negative.   Musculoskeletal: Positive for arthralgias (left knee pain).  Skin: Negative.   Allergic/Immunologic: Negative.   Neurological: Negative.   Hematological: Negative.   Psychiatric/Behavioral: Negative.        Objective:   Physical Exam  Nursing note and vitals reviewed. Constitutional: She is oriented to person, place, and time. She appears well-developed and well-nourished. No distress.  HENT:  Head: Normocephalic and atraumatic.  Left Ear: External ear normal.  Nose: Nose normal.  Mouth/Throat: Oropharynx is clear and moist.  Right ear cerumen  Eyes: Conjunctivae and EOM are normal. Pupils are equal, round, and reactive to light. Right eye exhibits no discharge. Left eye exhibits no discharge. No scleral icterus.  Neck: Normal range of motion. Neck supple. No thyromegaly present.  No carotid bruits  Cardiovascular: Normal rate, regular rhythm, normal heart sounds and intact distal pulses.  Exam reveals no gallop and no friction rub.   No murmur heard. At 72 per minute  Pulmonary/Chest: Effort normal and breath sounds normal. No respiratory distress. She has no wheezes. She has no rales. She exhibits no tenderness.  Abdominal: Soft. Bowel sounds are normal. She exhibits no mass. There is no tenderness. There is no rebound and no guarding.  Musculoskeletal: Normal range of motion. She exhibits tenderness. She exhibits no edema.  Tender bilateral joint lines of the left knee and discomfort with flexion and extension  Lymphadenopathy:    She has no cervical adenopathy.  Neurological: She is alert and oriented to person, place, and time. She has normal reflexes. No cranial nerve deficit.  Skin: Skin is warm and dry. No rash noted.  Psychiatric: She has a normal mood and affect. Her behavior is normal. Judgment and thought content normal.   BP 114/70  Pulse 63  Temp(Src) 97.5 F (36.4 C) (Oral)  Ht 5\' 3"  (1.6 m)  Wt 156 lb (70.761 kg)  BMI 27.64 kg/m2  EKG: -No  change from previous EKG and within normal       Assessment & Plan:  1. GERD with stricture  2. HTN (hypertension) - EKG 12-Lead  3. Hyperlipidemia - EKG 12-Lead  4. Screening - Ambulatory referral to Ophthalmology - EKG 12-Lead  5. Left knee pain -Refer to Dr. Alvan Dame  6. Osteoarthritis of left knee -Refer to Dr. Alvan Dame  7. Renal insufficiency -Avoid anti-inflammatory medication  Meds ordered this encounter  Medications  . DISCONTD: atorvastatin (LIPITOR) 80 MG tablet    Sig: Take 80 mg by mouth daily.  Marland Kitchen DISCONTD: omeprazole (PRILOSEC) 40 MG capsule    Sig: Take 40 mg by mouth daily.  Marland Kitchen omeprazole (PRILOSEC) 40 MG capsule    Sig: Take 1 capsule (40 mg total) by mouth daily.    Dispense:  90 capsule    Refill:  3  . hydrochlorothiazide (HYDRODIURIL) 25 MG tablet    Sig: Take 1 tablet (25 mg total) by mouth daily.    Dispense:  90 tablet    Refill:  0  . enalapril (VASOTEC) 10 MG tablet    Sig: Take 1 tablet (10 mg total) by mouth 2 (two) times daily.    Dispense:  180 tablet    Refill:  3  . DISCONTD: atorvastatin (LIPITOR) 80 MG tablet    Sig: Take 1 tablet (80 mg total) by mouth daily.    Dispense:  90 tablet    Refill:  3  . atorvastatin (LIPITOR) 80 MG tablet    Sig: Take 1 tablet (80 mg total) by mouth daily.    Dispense:  90 tablet    Refill:  3   Patient Instructions                       Medicare Annual Wellness Visit  [facility] and the medical providers at [facility] strive to bring you the best medical care.  In doing so we not only want to address your current medical conditions and concerns but also to detect new conditions early and prevent illness, disease and health-related problems.    Medicare offers a yearly Wellness Visit which allows our clinical staff to assess your need for preventative services including immunizations, lifestyle education, counseling to decrease risk of preventable diseases and screening for fall  risk and other medical concerns.    This visit is provided free of charge (no copay) for all Medicare recipients. The clinical pharmacists at [facility] have begun to conduct these Wellness Visits which will also include a thorough review of all your medications.    As you primary medical provider recommend that you make an appointment for your Annual Wellness Visit if you have not done so already this year.  You may set up this appointment before you leave today or you may call back ([phone]) and schedule an appointment.  Please make sure when you call that you mention that you are scheduling your Annual Wellness Visit with the clinical pharmacist so  that the appointment may be made for the proper length of time.     Continue current medications. Continue good therapeutic lifestyle changes which include good diet and exercise. Fall precautions discussed with patient. If an FOBT was given today- please return it to our front desk. If you are over 50 years old - you may need Prevnar 76 or the adult Pneumonia vaccine.  Continue to be extremely careful going up and down steps and with getting up from a sitting position and starting to walk. We will arrange for you a visit to see the orthopedist regarding your continued left knee pain Please keep your appointment with the ophthalmologist once  this is arranged.   Arrie Senate MD

## 2013-12-22 ENCOUNTER — Ambulatory Visit: Payer: Medicare PPO | Attending: Orthopedic Surgery | Admitting: Physical Therapy

## 2013-12-22 DIAGNOSIS — M545 Low back pain, unspecified: Secondary | ICD-10-CM | POA: Diagnosis not present

## 2013-12-22 DIAGNOSIS — R293 Abnormal posture: Secondary | ICD-10-CM | POA: Diagnosis not present

## 2013-12-22 DIAGNOSIS — IMO0001 Reserved for inherently not codable concepts without codable children: Secondary | ICD-10-CM | POA: Diagnosis present

## 2013-12-22 DIAGNOSIS — M5137 Other intervertebral disc degeneration, lumbosacral region: Secondary | ICD-10-CM | POA: Insufficient documentation

## 2013-12-22 DIAGNOSIS — I10 Essential (primary) hypertension: Secondary | ICD-10-CM | POA: Diagnosis not present

## 2013-12-22 DIAGNOSIS — M51379 Other intervertebral disc degeneration, lumbosacral region without mention of lumbar back pain or lower extremity pain: Secondary | ICD-10-CM | POA: Insufficient documentation

## 2013-12-24 ENCOUNTER — Ambulatory Visit: Payer: Medicare PPO | Admitting: Physical Therapy

## 2013-12-24 DIAGNOSIS — IMO0001 Reserved for inherently not codable concepts without codable children: Secondary | ICD-10-CM | POA: Diagnosis not present

## 2014-01-02 ENCOUNTER — Ambulatory Visit: Payer: Medicare PPO | Attending: Orthopedic Surgery | Admitting: *Deleted

## 2014-01-02 DIAGNOSIS — IMO0001 Reserved for inherently not codable concepts without codable children: Secondary | ICD-10-CM | POA: Diagnosis present

## 2014-01-02 DIAGNOSIS — M51379 Other intervertebral disc degeneration, lumbosacral region without mention of lumbar back pain or lower extremity pain: Secondary | ICD-10-CM | POA: Insufficient documentation

## 2014-01-02 DIAGNOSIS — R293 Abnormal posture: Secondary | ICD-10-CM | POA: Diagnosis not present

## 2014-01-02 DIAGNOSIS — M545 Low back pain, unspecified: Secondary | ICD-10-CM | POA: Diagnosis not present

## 2014-01-02 DIAGNOSIS — M5137 Other intervertebral disc degeneration, lumbosacral region: Secondary | ICD-10-CM | POA: Insufficient documentation

## 2014-01-02 DIAGNOSIS — I10 Essential (primary) hypertension: Secondary | ICD-10-CM | POA: Diagnosis not present

## 2014-01-09 ENCOUNTER — Ambulatory Visit: Payer: Medicare PPO | Admitting: Physical Therapy

## 2014-01-14 ENCOUNTER — Ambulatory Visit: Payer: Medicare PPO | Admitting: Physical Therapy

## 2014-01-14 DIAGNOSIS — IMO0001 Reserved for inherently not codable concepts without codable children: Secondary | ICD-10-CM | POA: Diagnosis not present

## 2014-01-21 ENCOUNTER — Ambulatory Visit: Payer: Medicare PPO | Admitting: Physical Therapy

## 2014-01-21 DIAGNOSIS — IMO0001 Reserved for inherently not codable concepts without codable children: Secondary | ICD-10-CM | POA: Diagnosis not present

## 2014-01-22 ENCOUNTER — Other Ambulatory Visit: Payer: Self-pay | Admitting: Family Medicine

## 2014-01-28 ENCOUNTER — Encounter: Payer: Medicare Other | Admitting: Physical Therapy

## 2014-04-15 ENCOUNTER — Encounter: Payer: Self-pay | Admitting: Gastroenterology

## 2014-04-20 ENCOUNTER — Other Ambulatory Visit: Payer: Self-pay | Admitting: Family Medicine

## 2014-05-18 ENCOUNTER — Other Ambulatory Visit (INDEPENDENT_AMBULATORY_CARE_PROVIDER_SITE_OTHER): Payer: Medicare HMO

## 2014-05-18 DIAGNOSIS — E785 Hyperlipidemia, unspecified: Secondary | ICD-10-CM

## 2014-05-18 DIAGNOSIS — I10 Essential (primary) hypertension: Secondary | ICD-10-CM

## 2014-05-18 DIAGNOSIS — R7989 Other specified abnormal findings of blood chemistry: Secondary | ICD-10-CM

## 2014-05-18 DIAGNOSIS — E559 Vitamin D deficiency, unspecified: Secondary | ICD-10-CM

## 2014-05-18 DIAGNOSIS — R7309 Other abnormal glucose: Secondary | ICD-10-CM

## 2014-05-18 LAB — POCT CBC
Granulocyte percent: 55.8 % (ref 37–80)
HCT, POC: 40 % (ref 37.7–47.9)
Hemoglobin: 12.9 g/dL (ref 12.2–16.2)
Lymph, poc: 1.8 (ref 0.6–3.4)
MCH, POC: 29 pg (ref 27–31.2)
MCHC: 32.2 g/dL (ref 31.8–35.4)
MCV: 90.2 fL (ref 80–97)
MPV: 7.5 fL (ref 0–99.8)
POC Granulocyte: 2.8 (ref 2–6.9)
POC LYMPH PERCENT: 36 % (ref 10–50)
Platelet Count, POC: 213 10*3/uL (ref 142–424)
RBC: 4.4 M/uL (ref 4.04–5.48)
RDW, POC: 12.9 %
WBC: 5 10*3/uL (ref 4.6–10.2)

## 2014-05-18 NOTE — Progress Notes (Signed)
Lab only 

## 2014-05-19 ENCOUNTER — Telehealth: Payer: Self-pay | Admitting: Family Medicine

## 2014-05-19 LAB — NMR, LIPOPROFILE
Cholesterol: 151 mg/dL (ref 100–199)
HDL Cholesterol by NMR: 42 mg/dL
HDL Particle Number: 29.1 umol/L — ABNORMAL LOW
LDL Particle Number: 929 nmol/L
LDL Size: 19.9 nm
LDLC SERPL CALC-MCNC: 69 mg/dL (ref 0–99)
LP-IR Score: 57 — ABNORMAL HIGH
Small LDL Particle Number: 709 nmol/L — ABNORMAL HIGH
Triglycerides by NMR: 198 mg/dL — ABNORMAL HIGH (ref 0–149)

## 2014-05-19 LAB — VITAMIN D 25 HYDROXY (VIT D DEFICIENCY, FRACTURES): Vit D, 25-Hydroxy: 45.9 ng/mL (ref 30.0–100.0)

## 2014-05-19 LAB — BMP8+EGFR
BUN/Creatinine Ratio: 18 (ref 11–26)
BUN: 23 mg/dL (ref 8–27)
CO2: 28 mmol/L (ref 18–29)
Calcium: 9.6 mg/dL (ref 8.7–10.3)
Chloride: 98 mmol/L (ref 97–108)
Creatinine, Ser: 1.29 mg/dL — ABNORMAL HIGH (ref 0.57–1.00)
GFR calc Af Amer: 47 mL/min/{1.73_m2} — ABNORMAL LOW
GFR calc non Af Amer: 41 mL/min/{1.73_m2} — ABNORMAL LOW
Glucose: 124 mg/dL — ABNORMAL HIGH (ref 65–99)
Potassium: 4 mmol/L (ref 3.5–5.2)
Sodium: 141 mmol/L (ref 134–144)

## 2014-05-19 LAB — HEPATIC FUNCTION PANEL
ALT: 18 [IU]/L (ref 0–32)
AST: 20 [IU]/L (ref 0–40)
Albumin: 4.1 g/dL (ref 3.5–4.8)
Alkaline Phosphatase: 49 [IU]/L (ref 39–117)
Bilirubin, Direct: 0.09 mg/dL (ref 0.00–0.40)
Total Bilirubin: 0.3 mg/dL (ref 0.0–1.2)
Total Protein: 6.7 g/dL (ref 6.0–8.5)

## 2014-05-19 NOTE — Telephone Encounter (Signed)
Pt called

## 2014-05-20 LAB — POCT GLYCOSYLATED HEMOGLOBIN (HGB A1C): Hemoglobin A1C: 6.2

## 2014-05-20 NOTE — Addendum Note (Signed)
Addended by: Pollyann Kennedy F on: 05/20/2014 10:55 AM   Modules accepted: Orders

## 2014-05-26 ENCOUNTER — Encounter: Payer: Self-pay | Admitting: Family Medicine

## 2014-05-26 ENCOUNTER — Ambulatory Visit (INDEPENDENT_AMBULATORY_CARE_PROVIDER_SITE_OTHER): Payer: Medicare HMO | Admitting: Family Medicine

## 2014-05-26 VITALS — BP 115/64 | HR 68 | Temp 97.2°F | Ht 63.0 in | Wt 157.0 lb

## 2014-05-26 DIAGNOSIS — K219 Gastro-esophageal reflux disease without esophagitis: Secondary | ICD-10-CM

## 2014-05-26 DIAGNOSIS — N289 Disorder of kidney and ureter, unspecified: Secondary | ICD-10-CM

## 2014-05-26 DIAGNOSIS — L819 Disorder of pigmentation, unspecified: Secondary | ICD-10-CM

## 2014-05-26 DIAGNOSIS — I10 Essential (primary) hypertension: Secondary | ICD-10-CM

## 2014-05-26 DIAGNOSIS — K222 Esophageal obstruction: Secondary | ICD-10-CM

## 2014-05-26 DIAGNOSIS — E785 Hyperlipidemia, unspecified: Secondary | ICD-10-CM

## 2014-05-26 DIAGNOSIS — Z23 Encounter for immunization: Secondary | ICD-10-CM

## 2014-05-26 NOTE — Progress Notes (Signed)
Subjective:    Patient ID: Kimberly Suarez, female    DOB: 04-Oct-1940, 73 y.o.   MRN: [id number]  HPI Pt here for follow up and management of chronic medical problems. Recent lab work will be reviewed with the patient is significance that her hemoglobin A1c was 6.2%. She does complain of a dry mouth and a skin lesion on the tip of her nose.        Patient Active Problem List   Diagnosis Date Noted  . Renal insufficiency 11/18/2013  . Osteoarthritis of left knee 11/18/2013  . HTN (hypertension) 06/26/2013  . Lymphocytic colitis 10/30/2012  . Hyperlipidemia 10/30/2012  . Unspecified constipation 07/10/2011  . Dysphagia, unspecified 07/10/2011  . Diverticulosis of colon (without mention of hemorrhage) 07/10/2011  . Special screening for malignant neoplasms, colon 07/10/2011  . GERD with stricture 07/10/2011   Outpatient Encounter Prescriptions as of 05/26/2014  Medication Sig  . aspirin 81 MG tablet Take 81 mg by mouth daily.    Marland Kitchen atorvastatin (LIPITOR) 80 MG tablet Take 1 tablet (80 mg total) by mouth daily.  . Calcium Citrate (CITRACAL PO) Take 1 tablet by mouth daily.    . enalapril (VASOTEC) 10 MG tablet Take 1 tablet (10 mg total) by mouth 2 (two) times daily.  Marland Kitchen estradiol (ESTRACE) 0.1 MG/GM vaginal cream Place 1 Applicatorful vaginally at bedtime.  . Glucosamine-Chondroitin (GLUCOSAMINE CHONDR COMPLEX PO) Take 1 tablet by mouth 2 (two) times daily.   . hydrochlorothiazide (HYDRODIURIL) 25 MG tablet TAKE 1 TABLET EVERY DAY  . Omega-3 Fatty Acids (FISH OIL) 1000 MG CAPS Take 1 capsule by mouth 2 (two) times daily.   Marland Kitchen omeprazole (PRILOSEC) 40 MG capsule Take 1 capsule (40 mg total) by mouth daily.    Review of Systems  Constitutional: Negative.   HENT: Negative.        Dry mouth  Eyes: Negative.   Respiratory: Negative.   Cardiovascular: Negative.   Gastrointestinal: Negative.   Endocrine: Negative.   Genitourinary: Negative.   Musculoskeletal: Negative.   Skin:  Negative.        Skin lesion on tip of nose  Allergic/Immunologic: Negative.   Neurological: Negative.   Hematological: Negative.   Psychiatric/Behavioral: Negative.        Objective:   Physical Exam  Nursing note and vitals reviewed. Constitutional: She is oriented to person, place, and time. She appears well-developed and well-nourished. No distress.  HENT:  Head: Normocephalic and atraumatic.  Right Ear: External ear normal.  Left Ear: External ear normal.  Mouth/Throat: Oropharynx is clear and moist.  There is nasal congestion bilaterally The patient has a lesion on the tip of her nose which is atypical  Eyes: Conjunctivae and EOM are normal. Pupils are equal, round, and reactive to light. Right eye exhibits no discharge. Left eye exhibits no discharge. No scleral icterus.  Neck: Normal range of motion. Neck supple. No JVD present. No thyromegaly present.  There are no carotid bruits  Cardiovascular: Normal rate, regular rhythm, normal heart sounds and intact distal pulses.  Exam reveals no gallop and no friction rub.   No murmur heard. The heart is regular at 72 per minute  Pulmonary/Chest: Effort normal and breath sounds normal. No respiratory distress. She has no wheezes. She has no rales. She exhibits no tenderness.  The lungs are clear anteriorly and posteriorly  Abdominal: Soft. Bowel sounds are normal. She exhibits no mass. There is tenderness. There is no rebound and no guarding.  There is slight  epigastric tenderness and left and right upper quadrant tenderness to palpation without masses.  Musculoskeletal: Normal range of motion. She exhibits no edema and no tenderness.  Lymphadenopathy:    She has no cervical adenopathy.  Neurological: She is alert and oriented to person, place, and time. She has normal reflexes. No cranial nerve deficit.  Skin: Skin is warm and dry.   There is a flat lesion on the tip of the nose with surrounding slight erythema.  Psychiatric: She  has a normal mood and affect. Her behavior is normal. Judgment and thought content normal.   BP 115/64  Pulse 68  Temp(Src) 97.2 F (36.2 C) (Oral)  Ht 5\' 3"  (1.6 m)  Wt 157 lb (71.215 kg)  BMI 27.82 kg/m2        Assessment & Plan:  1. Need for prophylactic vaccination and inoculation against influenza  2. Essential hypertension  3. Hyperlipidemia  4. GERD with stricture  5. Renal insufficiency  6. Atypical pigmented skin lesion   Patient Instructions                       Medicare Annual Wellness Visit  [facility] and the medical providers at [facility] strive to bring you the best medical care.  In doing so we not only want to address your current medical conditions and concerns but also to detect new conditions early and prevent illness, disease and health-related problems.    Medicare offers a yearly Wellness Visit which allows our clinical staff to assess your need for preventative services including immunizations, lifestyle education, counseling to decrease risk of preventable diseases and screening for fall risk and other medical concerns.    This visit is provided free of charge (no copay) for all Medicare recipients. The clinical pharmacists at [facility] have begun to conduct these Wellness Visits which will also include a thorough review of all your medications.    As you primary medical provider recommend that you make an appointment for your Annual Wellness Visit if you have not done so already this year.  You may set up this appointment before you leave today or you may call back ([phone]) and schedule an appointment.  Please make sure when you call that you mention that you are scheduling your Annual Wellness Visit with the clinical pharmacist so that the appointment may be made for the proper length of time.     Continue current medications. Continue good therapeutic lifestyle changes which include good diet  and exercise. Fall precautions discussed with patient. If an FOBT was given today- please return it to our front desk. If you are over 43 years old - you may need Prevnar 48 or the adult Pneumonia vaccine.  Flu Shots will be available at our office starting mid- September. Please call and schedule a FLU CLINIC APPOINTMENT.   Keep appointment with clinical pharmacy for diet education and help with blood sugar We will arrange an appointment for you with dermatology Do not forget to get your pelvic exam Start using Nasacort or Flonase one spray each nostril at bedtime Drink plenty of fluids   Arrie Senate MD

## 2014-05-26 NOTE — Addendum Note (Signed)
Addended by: Zannie Cove on: 05/26/2014 10:52 AM   Modules accepted: Orders

## 2014-05-26 NOTE — Patient Instructions (Addendum)
Medicare Annual Wellness Visit  [facility] and the medical providers at [facility] strive to bring you the best medical care.  In doing so we not only want to address your current medical conditions and concerns but also to detect new conditions early and prevent illness, disease and health-related problems.    Medicare offers a yearly Wellness Visit which allows our clinical staff to assess your need for preventative services including immunizations, lifestyle education, counseling to decrease risk of preventable diseases and screening for fall risk and other medical concerns.    This visit is provided free of charge (no copay) for all Medicare recipients. The clinical pharmacists at [facility] have begun to conduct these Wellness Visits which will also include a thorough review of all your medications.    As you primary medical provider recommend that you make an appointment for your Annual Wellness Visit if you have not done so already this year.  You may set up this appointment before you leave today or you may call back ([phone]) and schedule an appointment.  Please make sure when you call that you mention that you are scheduling your Annual Wellness Visit with the clinical pharmacist so that the appointment may be made for the proper length of time.     Continue current medications. Continue good therapeutic lifestyle changes which include good diet and exercise. Fall precautions discussed with patient. If an FOBT was given today- please return it to our front desk. If you are over 62 years old - you may need Prevnar 79 or the adult Pneumonia vaccine.  Flu Shots will be available at our office starting mid- September. Please call and schedule a FLU CLINIC APPOINTMENT.   Keep appointment with clinical pharmacy for diet education and help with blood sugar We will arrange an appointment for you with dermatology Do not  forget to get your pelvic exam Start using Nasacort or Flonase one spray each nostril at bedtime Drink plenty of fluids

## 2014-06-01 ENCOUNTER — Ambulatory Visit (INDEPENDENT_AMBULATORY_CARE_PROVIDER_SITE_OTHER): Payer: Medicare HMO | Admitting: Pharmacist

## 2014-06-01 ENCOUNTER — Encounter: Payer: Self-pay | Admitting: Pharmacist

## 2014-06-01 VITALS — BP 120/70 | HR 77 | Ht 63.0 in | Wt 155.0 lb

## 2014-06-01 DIAGNOSIS — Z0000 Encounter for general adult medical examination without abnormal findings: Secondary | ICD-10-CM

## 2014-06-01 DIAGNOSIS — R7303 Prediabetes: Secondary | ICD-10-CM | POA: Insufficient documentation

## 2014-06-01 NOTE — Progress Notes (Signed)
Subjective:    Kimberly Suarez is a 73 y.o. female who presents for Medicare Initial Wellness Visit.  Kimberly Suarez also has a recent A1c which indicated prediabetes.  Her last A1c was 6.2% Preventive Screening-Counseling & Management  Tobacco History  Smoking status  . Never Smoker   Smokeless tobacco  . Never Used     Current Problems (verified) Patient Active Problem List   Diagnosis Date Noted  . Renal insufficiency 11/18/2013  . Osteoarthritis of left knee 11/18/2013  . HTN (hypertension) 06/26/2013  . Lymphocytic colitis 10/30/2012  . Hyperlipidemia 10/30/2012  . Unspecified constipation 07/10/2011  . Dysphagia, unspecified 07/10/2011  . Diverticulosis of colon (without mention of hemorrhage) 07/10/2011  . Special screening for malignant neoplasms, colon 07/10/2011  . GERD with stricture 07/10/2011    Medications Prior to Visit Current Outpatient Prescriptions on File Prior to Visit  Medication Sig Dispense Refill  . aspirin 81 MG tablet Take 81 mg by mouth daily.        . enalapril (VASOTEC) 10 MG tablet Take 1 tablet (10 mg total) by mouth 2 (two) times daily.  180 tablet  3  . estradiol (ESTRACE) 0.1 MG/GM vaginal cream Place 1 Applicatorful vaginally at bedtime.      . Glucosamine-Chondroitin (GLUCOSAMINE CHONDR COMPLEX PO) Take 1 tablet by mouth 2 (two) times daily.       . hydrochlorothiazide (HYDRODIURIL) 25 MG tablet TAKE 1 TABLET EVERY DAY  90 tablet  1  . Omega-3 Fatty Acids (FISH OIL) 1000 MG CAPS Take 1 capsule by mouth 2 (two) times daily.        No current facility-administered medications on file prior to visit.    Current Medications (verified) Current Outpatient Prescriptions  Medication Sig Dispense Refill  . aspirin 81 MG tablet Take 81 mg by mouth daily.        Marland Kitchen atorvastatin (LIPITOR) 80 MG tablet Take 40 mg by mouth daily.      . Calcium Carbonate-Simethicone (TUMS PLUS PO) Take 1 tablet by mouth as needed.      . enalapril (VASOTEC) 10 MG  tablet Take 1 tablet (10 mg total) by mouth 2 (two) times daily.  180 tablet  3  . estradiol (ESTRACE) 0.1 MG/GM vaginal cream Place 1 Applicatorful vaginally at bedtime.      . Glucosamine-Chondroitin (GLUCOSAMINE CHONDR COMPLEX PO) Take 1 tablet by mouth 2 (two) times daily.       . hydrochlorothiazide (HYDRODIURIL) 25 MG tablet TAKE 1 TABLET EVERY DAY  90 tablet  1  . Multiple Vitamins-Minerals (HAIR/SKIN/NAILS) TABS Take 1 tablet by mouth daily.      . Omega-3 Fatty Acids (FISH OIL) 1000 MG CAPS Take 1 capsule by mouth 2 (two) times daily.       . Probiotic Product (PROBIOTIC DAILY PO) Take by mouth. Takes INSYNC probiotics 1 capsules daily       No current facility-administered medications for this visit.     Allergies (verified) Review of patient's allergies indicates no known allergies.   PAST HISTORY  Family History Family History  Problem Relation Age of Onset  . Colon cancer Neg Hx   . Kidney disease Neg Hx   . Liver disease Neg Hx   . Heart disease Mother   . Diabetes Brother     BKA  . COPD Father   . Asthma Father   . Alzheimer's disease Sister   . Alzheimer's disease Brother   . Multiple sclerosis Sister   .  Heart disease Sister   . Diabetes Brother   . Early death Brother     died in war    Social History History  Substance Use Topics  . Smoking status: Never Smoker   . Smokeless tobacco: Never Used  . Alcohol Use: No     Are there smokers in your home (other than you)? No  Risk Factors Current exercise habits: Gym/ health club routine includes light weights and treadmill.  Dietary issues discussed: limiting CHO related to pre diabetes and elevated triglycerides   Cardiac risk factors: advanced age (older than 62 for men, 17 for women), dyslipidemia and family history of premature cardiovascular disease.  Depression Screen (Note: if answer to either of the following is "Yes", a more complete depression screening is indicated)   Over the past 2  weeks, have you felt down, depressed or hopeless? No  Over the past 2 weeks, have you felt little interest or pleasure in doing things? No  Have you lost interest or pleasure in daily life? No  Do you often feel hopeless? No  Do you cry easily over simple problems? No  Activities of Daily Living In your present state of health, do you have any difficulty performing the following activities?:  Driving? No Managing money?  No Feeding yourself? No Getting from bed to chair? No  Climbing a flight of stairs? No Preparing food and eating?: No Bathing or showering? No Getting dressed: No Getting to the toilet? No Using the toilet:No Moving around from place to place: No In the past year have you fallen or had a near fall?:No   Are you sexually active?  Yes  Do you have more than one partner?  No  Hearing Difficulties: No Do you often ask people to speak up or repeat themselves? No Do you experience ringing or noises in your ears? No Do you have difficulty understanding soft or whispered voices? No   Do you feel that you have a problem with memory? No  Do you often misplace items? No  Do you feel safe at home?  Yes  Cognitive Testing  Alert? Yes  Normal Appearance?Yes  Oriented to person? Yes    Place? Yes   Time? Yes  Recall of three objects?  Yes  Can perform simple calculations? Yes  Displays appropriate judgment?Yes  Can read the correct time from a watch face?Yes   Advanced Directives have been discussed with the patient? Yes  List the Names of Other Physician/Practitioners you currently use: 1.  Opthalmologist - Dr Gershon Crane 2.  GYN - Dr Phineas Real 3.  GI - Kathi Der any recent Medical Services you may have received from other than [facility] providers in the past year (date may be approximate).  Immunization History  Administered Date(s) Administered  . Influenza,inj,Quad PF,36+ Mos 07/02/2013, 05/26/2014  . Pneumococcal Conjugate-13 07/02/2013    Screening  Tests Health Maintenance  Topic Date Due  . Colon Cancer Screening Annual Fobt  06/28/2014  . Influenza Vaccine  03/29/2015  . Mammogram  09/30/2015  . Tetanus/tdap  05/28/2021  . Colonoscopy  07/09/2021  . Pneumococcal Polysaccharide Vaccine Age 94 And Over  Completed  . Zostavax  Completed    All answers were reviewed with the patient and necessary referrals were made:  Cherre Robins, [facility]   06/01/2014   History reviewed: allergies, current medications, past family history, past medical history, past social history, past surgical history and problem list   Objective:   Body mass index is  27.46 kg/(m^2). BP 120/70  Pulse 77  Ht 5\' 3"  (1.6 m)  Wt 155 lb (70.308 kg)  BMI 27.46 kg/m2   Assessment:     Initial Medicare Wellness Visit Prediabetes     Plan:     During the course of the visit the patient was educated and counseled about appropriate screening and preventive services including:    Pneumococcal vaccine - UTD  Influenza vaccine - UTD  Td vaccine - UTD  Screening mammography - UTD  Screening Pap smear and pelvic exam - scheduled for later this month  Bone densitometry screening - UTD  Colorectal cancer screening - UTD  Diabetes screening - UTD  Glaucoma screening - UTD Nutrition counseling - reviewed food that affect BG the most.  Discussed the recommended serving sizes.   Increase non-starchy vegetables - carrots, green bean, squash, zucchini, tomatoes, onions, peppers, spinach and other green leafy vegetables, cabbage, lettuce, cucumbers, asparagus, okra (not fried), eggplant limit sugar and processed foods (cakes, cookies, ice cream, crackers and chips) Increase fresh fruit but limit serving sizes 1/2 cup or about the size of tennis or baseball limit red meat to no more than 1-2 times per week (serving size about the size of your palm)  Choose whole grains / lean proteins - whole wheat bread, quinoa, whole grain rice (1/2 cup), fish, chicken,  [place]  Advanced directives: copy of [facility]ctions Packet given   Patient Instructions (the written plan) was given to the patient.  Medicare Attestation I have personally reviewed: The patient's medical and social history Their use of alcohol, tobacco or illicit drugs Their current medications and supplements The patient's functional ability including ADLs,fall risks, home safety risks, cognitive, and hearing and visual impairment Diet and physical activities Evidence for depression or mood disorders  The patient's weight, height, BMI, and HR/BP have been recorded in the chart.  I have made referrals, counseling, and provided education to the patient based on review of the above and I have provided the patient with a written personalized care plan for preventive services.     Cherre Robins, [facility]   06/01/2014

## 2014-06-01 NOTE — Patient Instructions (Signed)
Health Maintenance  Topic Date Due  . Colon Cancer Screening Annual - Fecal Occult Test 06/28/2014  . Influenza Vaccine  03/29/2015  . Mammogram  09/30/2015  . Tetanus/tdap  05/28/2021   Bone Density / DEXA 07/03/2015   Pap Smeal Due now - already has appoitment  . Colonoscopy  07/09/2021  . Pneumococcal Polysaccharide Vaccine Age 73 And Over  Completed  . Zostavax  Completed     Prediabetes Many people have heard about type 2 diabetes, but its common precursor, prediabetes, doesn't get as much attention. Prediabetes is estimated by CDC to affect 86 million Americans (this includes 51% of people 65 years and older), and an estimated 90% of people with prediabetes don't even know it. According to the CDC, 15-30% of these individuals will develop type 2 diabetes within five years. In other words, as many as 26 million people that currently have prediabetes could develop type 2 diabetes by 2020, effectively doubling the number of people with type 2 diabetes in the [place].  What is prediabetes? Prediabetes is a condition where blood sugar levels are higher than normal, but not high enough to be diagnosed as type 2 diabetes. This occurs when the body has problems in processing glucose properly, and sugar starts to build up in the bloodstream instead of fueling cells in muscles and tissues. Insulin is the hormone that tells cells to take up glucose, and in prediabetes, people typically initially develop insulin resistance (where the body's cells can't respond to insulin as well), and over time (if no actions are taken to reverse the situation) the ability to produce sufficient insulin is reduced. People with prediabetes also commonly have high blood pressure as well as abnormal blood lipids (e.g. cholesterol). These often occur prior to the rise of blood glucose levels.  What are the symptoms of prediabetes? People typically do not have symptoms of prediabetes, which is partially why up to 90% of people  don't know they have it. The ADA reports that some people with prediabetes may develop symptoms of type 2 diabetes, though even many people diagnosed with type 2 diabetes show little or no symptoms initially at diagnosis.  How is prediabetes diagnosed? According to the American Diabetes Association, prediabetes can be diagnosed through one of the following tests: 1. A glycated hemoglobin test, also known as HbA1c or simply A1c, gives an idea of the body's average blood sugar levels from the past two or three months. It is usually done with a small drop of blood from a fingerstick or as part of having blood taken in a doctor's office, hospital, or laboratory. A1c Level Diagnosis  Less than 5.7% Normal  5.7% to 6.4% Prediabetes  6.5% and higher Diabetes  2. A fasting plasma glucose (FPG) test measures a person's blood glucose level after fasting (not eating) for eight hours - this is typically done in the morning. If a test shows positive for prediabetes, a second test should be taken on a different day to confirm the diagnosis. FPG Level Diagnosis  Less than 100 mg/dl Normal  100 mg/dl to 125 mg/dl Prediabetes  126 mg/dl and higher Diabetes   Who is at risk of developing prediabetes? A well-known paper published in the Lancet in 2010 recommends screening for type 2 diabetes (which would also screen for prediabetes) every 3-5 years in all adults over the age of 6, regardless of other risk factors. Overweight and obese adults (a BMI >25 kg/m2) are also at significantly greater risk for developing prediabetes, as  well as people with a family history of type 2 diabetes. According to the CDC, several other factors can have moderate influences on prediabetes risk in addition to age, weight, and family history: People with an [place] American, Hispanic/Latino, American [place] American, or [place] racial or ethnic background. The 2015 ADA Standards of Medical Care recommendations suggest  Asian Americans with a BMI of 23 or above be screened for type 2 diabetes.  Women with a history of diabetes during pregnancy ("gestational diabetes") or have given birth to a baby weighing nine pounds or more. People who are physically active fewer than three times a week. The CDC offers a fast, online screening test for evaluating the risk for prediabetes. The ADA also offers a screening test to assess type 2 diabetes risk. Of course, these tests do not themselves confirm a prediabetes diagnosis, but just if someone may be at higher risk of developing it.  Why do people develop prediabetes? Prediabetes develops through a combination of factors that are still being investigated. For sure, lifestyle factors (food, exercise, stress, sleep) play a role, but family history and genetics certainly do as well. It is easy to assume that prediabetes is the result of being overweight, but the relationship is not that simple. While obesity is one underlying cause of insulin resistance, many overweight individuals may never develop prediabetes or type 2 diabetes, and a minority of people with prediabetes have never been overweight. To make matters worse, it can be increasingly difficult to make healthy choices in today's toxic food environment that steers all of [place] to make the wrong food choices, and there are many factors that can contribute to weight gain in addition to diet.  Is a prediabetes diagnosis serious? There has been significant debate around the term 'prediabetes,' and whether it should be considered cause for alarm. On the one hand, it serves as a risk factor for type 2 diabetes and a host of other complications, including heart disease, and ultimately prediabetes implies that a degree of metabolic problems have started to occur in the body. On the other hand, it places a diagnosis on many people who may never develop type 2 diabetes. Again, according to the CDC, 15-30% of those with prediabetes will  develop type 2 diabetes within five years. However, a 2012 Lancet article cites 5-10% of those with prediabetes each year will also revert back to healthy blood sugars. What's critical is not necessarily the cutoff itself, but where someone falls within the ranges listed above. The level of risk of developing type 2 diabetes is closely related to A1c or FPG at diagnosis. Those in the higher ranges (A1c closer to 6.4%, FPG closer to 125 mg/dl) are much more likely to progress to type 2 diabetes, whereas those at lower ranges (A1c closer to 5.7%, FPG closer to 100 mg/dl) are relatively more likely to revert back to normal glucose levels or stay within the prediabetes range. Age of diagnosis and the level of insulin production still occurring at diagnosis also impact the chances of reverting to normoglycemia (normal blood sugar levels).  What can people with prediabetes do to avoid the progression from prediabetes to type 2 diabetes? The most important action people diagnosed with prediabetes can take is to focus on living a healthy lifestyle. This includes making healthy food choices, controlling portions, and increasing physical activity. Regarding weight control, research shows losing 5-7% (often about 10-20 lbs.) from your initial body weight and keeping off as much of that weight  over time as possible is critical to lowering the risk of type 2 diabetes. This task is of course easier said than done, but sustained weight loss over time can be key to improving health and delaying or preventing the onset of type 2 diabetes. Several prediabetes interventions exist based on evidence from the landmark Diabetes Prevention Program (DPP) study. The DPP study reported that moderate weight loss (5-7% of body weight, or ~10-15 lbs. for someone weighing 200 lbs.), counseling, and education on healthy eating and behavior reduced the risk of developing type 2 diabetes by 58%. Data presented at the [place] 2014 conference showed  that after 15 years of follow-up of the DPP study groups, the results were still encouraging: 27% of those in the original lifestyle group had a significant reduction in type 2 diabetes progression compared to the control group. If you or someone you know has been told they have prediabetes, here are a few helpful resources: In-person diabetes prevention programs: The CDC offers a one year long lifestyle change program through its National Diabetes Prevention Program (NDPP) at various locations throughout the [place] to help participants adopt healthy habits and prevent or delay progression to type 2 diabetes. This program is a major undertaking by the CDC to translate the findings from the DPP study into a real world setting, a significant effort indeed! Online diabetes prevention programs: The CDC has now given pending recognition status to three digital prevention programs: [place], and [facility]. These offer the same one year long educational curriculum as the DPP study, but in an online format. Some insurance companies and employers cover these programs, and you can find more information at the links above. These digital versions are excellent options for those who live far away from NDPP locations or who prefer the anonymity and convenience of doing the program online. Metformin: The DPP study found that metformin, the safest first-line therapy for type 2 diabetes, may help delay the onset of type 2 diabetes in people with prediabetes. Participants who took the low-cost generic drug had a 31% reduced risk of developing type 2 diabetes compared to the control group (those not on metformin or intensive lifestyle intervention). Again, 15-year follow up data showed that 17% of those on metformin continued to have a significant reduction in type 2 progression. At this time, metformin (or any other medication, for that matter) is not currently FDA approved for prediabetes, and it is sometimes  prescribed "off-label" by a healthcare provider. Your healthcare provider can give you more information and determine whether metformin is a good option for you.  Can prediabetes be "cured"? In the early stages of prediabetes (and type 2 diabetes), diligent attention to food choices and activity, and most importantly weight loss, can improve blood sugar numbers, effectively "reversing" the disease and reducing the odds of developing type 2 diabetes. However, some people may have underlying factors (such as family history and genetics) that put them at a greater risk of type 2 diabetes, meaning they will always require careful attention to blood sugar levels and lifestyle choices. Returning to old habits will likely put someone back on the road to prediabetes, and eventually, type 2 diabetes     Preventive Care for Adults A healthy lifestyle and preventive care can promote health and wellness. Preventive health guidelines for women include the following key practices.  A routine yearly physical is a good way to check with your health care provider about your health and preventive screening. It is  a chance to share any concerns and updates on your health and to receive a thorough exam.  Visit your dentist for a routine exam and preventive care every 6 months. Brush your teeth twice a day and floss once a day. Good oral hygiene prevents tooth decay and gum disease.  The frequency of eye exams is based on your age, health, family medical history, use of contact lenses, and other factors. Follow your health care provider's recommendations for frequency of eye exams.  Eat a healthy diet. Foods like vegetables, fruits, whole grains, low-fat dairy products, and lean protein foods contain the nutrients you need without too many calories. Decrease your intake of foods high in solid fats, added sugars, and salt. Eat the right amount of calories for you.Get information about a proper diet from your health care  provider, if necessary.  Regular physical exercise is one of the most important things you can do for your health. Most adults should get at least 150 minutes of moderate-intensity exercise (any activity that increases your heart rate and causes you to sweat) each week. In addition, most adults need muscle-strengthening exercises on 2 or more days a week.  Maintain a healthy weight. The body mass index (BMI) is a screening tool to identify possible weight problems. It provides an estimate of body fat based on height and weight. Your health care provider can find your BMI and can help you achieve or maintain a healthy weight.For adults 20 years and older:  A BMI below 18.5 is considered underweight.  A BMI of 18.5 to 24.9 is normal.  A BMI of 25 to 29.9 is considered overweight.  A BMI of 30 and above is considered obese.  Maintain normal blood lipids and cholesterol levels by exercising and minimizing your intake of saturated fat. Eat a balanced diet with plenty of fruit and vegetables. Blood tests for lipids and cholesterol should begin at age 14 and be repeated every 5 years. If your lipid or cholesterol levels are high, you are over 50, or you are at high risk for heart disease, you may need your cholesterol levels checked more frequently.Ongoing high lipid and cholesterol levels should be treated with medicines if diet and exercise are not working.  If you smoke, find out from your health care provider how to quit. If you do not use tobacco, do not start.  Lung cancer screening is recommended for adults aged 12-80 years who are at high risk for developing lung cancer because of a history of smoking. A yearly low-dose CT scan of the lungs is recommended for people who have at least a 30-pack-year history of smoking and are a current smoker or have quit within the past 15 years. A pack year of smoking is smoking an average of 1 pack of cigarettes a day for 1 year (for example: 1 pack a day for  30 years or 2 packs a day for 15 years). Yearly screening should continue until the smoker has stopped smoking for at least 15 years. Yearly screening should be stopped for people who develop a health problem that would prevent them from having lung cancer treatment.  If you are pregnant, do not drink alcohol. If you are breastfeeding, be very cautious about drinking alcohol. If you are not pregnant and choose to drink alcohol, do not have more than 1 drink per day. One drink is considered to be 12 ounces (355 mL) of beer, 5 ounces (148 mL) of wine, or 1.5 ounces (44 mL)  of liquor.  Avoid use of street drugs. Do not share needles with anyone. Ask for help if you need support or instructions about stopping the use of drugs.  High blood pressure causes heart disease and increases the risk of stroke. Your blood pressure should be checked at least every 1 to 2 years. Ongoing high blood pressure should be treated with medicines if weight loss and exercise do not work.  If you are 76-12 years old, ask your health care provider if you should take aspirin to prevent strokes.  Diabetes screening involves taking a blood sample to check your fasting blood sugar level. This should be done once every 3 years, after age 17, if you are within normal weight and without risk factors for diabetes. Testing should be considered at a younger age or be carried out more frequently if you are overweight and have at least 1 risk factor for diabetes.  Breast cancer screening is essential preventive care for women. You should practice "breast self-awareness." This means understanding the normal appearance and feel of your breasts and may include breast self-examination. Any changes detected, no matter how small, should be reported to a health care provider. Women in their 34s and 30s should have a clinical breast exam (CBE) by a health care provider as part of a regular health exam every 1 to 3 years. After age 2, women should  have a CBE every year. Starting at age 62, women should consider having a mammogram (breast X-ray test) every year. Women who have a family history of breast cancer should talk to their health care provider about genetic screening. Women at a high risk of breast cancer should talk to their health care providers about having an MRI and a mammogram every year.  Breast cancer gene (BRCA)-related cancer risk assessment is recommended for women who have family members with BRCA-related cancers. BRCA-related cancers include breast, ovarian, tubal, and peritoneal cancers. Having family members with these cancers may be associated with an increased risk for harmful changes (mutations) in the breast cancer genes BRCA1 and BRCA2. Results of the assessment will determine the need for genetic counseling and BRCA1 and BRCA2 testing.  Routine pelvic exams to screen for cancer are no longer recommended for nonpregnant women who are considered low risk for cancer of the pelvic organs (ovaries, uterus, and vagina) and who do not have symptoms. Ask your health care provider if a screening pelvic exam is right for you.  If you have had past treatment for cervical cancer or a condition that could lead to cancer, you need Pap tests and screening for cancer for at least 20 years after your treatment. If Pap tests have been discontinued, your risk factors (such as having a new sexual partner) need to be reassessed to determine if screening should be resumed. Some women have medical problems that increase the chance of getting cervical cancer. In these cases, your health care provider may recommend more frequent screening and Pap tests.  The HPV test is an additional test that may be used for cervical cancer screening. The HPV test looks for the virus that can cause the cell changes on the cervix. The cells collected during the Pap test can be tested for HPV. The HPV test could be used to screen women aged 76 years and older, and  should be used in women of any age who have unclear Pap test results. After the age of 79, women should have HPV testing at the same frequency as a  Pap test.  Colorectal cancer can be detected and often prevented. Most routine colorectal cancer screening begins at the age of 69 years and continues through age 65 years. However, your health care provider may recommend screening at an earlier age if you have risk factors for colon cancer. On a yearly basis, your health care provider may provide home test kits to check for hidden blood in the stool. Use of a small camera at the end of a tube, to directly examine the colon (sigmoidoscopy or colonoscopy), can detect the earliest forms of colorectal cancer. Talk to your health care provider about this at age 23, when routine screening begins. Direct exam of the colon should be repeated every 5-10 years through age 35 years, unless early forms of pre-cancerous polyps or small growths are found.  People who are at an increased risk for hepatitis B should be screened for this virus. You are considered at high risk for hepatitis B if:  You were born in a country where hepatitis B occurs often. Talk with your health care provider about which countries are considered high risk.  Your parents were born in a high-risk country and you have not received a shot to protect against hepatitis B (hepatitis B vaccine).  You have HIV or AIDS.  You use needles to inject street drugs.  You live with, or have sex with, someone who has hepatitis B.  You get hemodialysis treatment.  You take certain medicines for conditions like cancer, organ transplantation, and autoimmune conditions.  Hepatitis C blood testing is recommended for all people born from [birth year] through 1965 and any individual with known risks for hepatitis C.  Practice safe sex. Use condoms and avoid high-risk sexual practices to reduce the spread of sexually transmitted infections (STIs). STIs include  gonorrhea, chlamydia, syphilis, trichomonas, herpes, HPV, and human immunodeficiency virus (HIV). Herpes, HIV, and HPV are viral illnesses that have no cure. They can result in disability, cancer, and death.  You should be screened for sexually transmitted illnesses (STIs) including gonorrhea and chlamydia if:  You are sexually active and are younger than 24 years.  You are older than 24 years and your health care provider tells you that you are at risk for this type of infection.  Your sexual activity has changed since you were last screened and you are at an increased risk for chlamydia or gonorrhea. Ask your health care provider if you are at risk.  If you are at risk of being infected with HIV, it is recommended that you take a prescription medicine daily to prevent HIV infection. This is called preexposure prophylaxis (PrEP). You are considered at risk if:  You are a heterosexual woman, are sexually active, and are at increased risk for HIV infection.  You take drugs by injection.  You are sexually active with a partner who has HIV.  Talk with your health care provider about whether you are at high risk of being infected with HIV. If you choose to begin PrEP, you should first be tested for HIV. You should then be tested every 3 months for as long as you are taking PrEP.  Osteoporosis is a disease in which the bones lose minerals and strength with aging. This can result in serious bone fractures or breaks. The risk of osteoporosis can be identified using a bone density scan. Women ages 8 years and over and women at risk for fractures or osteoporosis should discuss screening with their health care providers. Ask your health care  provider whether you should take a calcium supplement or vitamin D to reduce the rate of osteoporosis.  Menopause can be associated with physical symptoms and risks. Hormone replacement therapy is available to decrease symptoms and risks. You should talk to your  health care provider about whether hormone replacement therapy is right for you.  Use sunscreen. Apply sunscreen liberally and repeatedly throughout the day. You should seek shade when your shadow is shorter than you. Protect yourself by wearing long sleeves, pants, a wide-brimmed hat, and sunglasses year round, whenever you are outdoors.  Once a month, do a whole body skin exam, using a mirror to look at the skin on your back. Tell your health care provider of new moles, moles that have irregular borders, moles that are larger than a pencil eraser, or moles that have changed in shape or color.  Stay current with required vaccines (immunizations).  Influenza vaccine. All adults should be immunized every year.  Tetanus, diphtheria, and acellular pertussis (Td, Tdap) vaccine. Pregnant women should receive 1 dose of Tdap vaccine during each pregnancy. The dose should be obtained regardless of the length of time since the last dose. Immunization is preferred during the 27th-36th week of gestation. An adult who has not previously received Tdap or who does not know her vaccine status should receive 1 dose of Tdap. This initial dose should be followed by tetanus and diphtheria toxoids (Td) booster doses every 10 years. Adults with an unknown or incomplete history of completing a 3-dose immunization series with Td-containing vaccines should begin or complete a primary immunization series including a Tdap dose. Adults should receive a Td booster every 10 years.  Varicella vaccine. An adult without evidence of immunity to varicella should receive 2 doses or a second dose if she has previously received 1 dose. Pregnant females who do not have evidence of immunity should receive the first dose after pregnancy. This first dose should be obtained before leaving the health care facility. The second dose should be obtained 4-8 weeks after the first dose.  Human papillomavirus (HPV) vaccine. Females aged 13-26 years  who have not received the vaccine previously should obtain the 3-dose series. The vaccine is not recommended for use in pregnant females. However, pregnancy testing is not needed before receiving a dose. If a female is found to be pregnant after receiving a dose, no treatment is needed. In that case, the remaining doses should be delayed until after the pregnancy. Immunization is recommended for any person with an immunocompromised condition through the age of 2 years if she did not get any or all doses earlier. During the 3-dose series, the second dose should be obtained 4-8 weeks after the first dose. The third dose should be obtained 24 weeks after the first dose and 16 weeks after the second dose.  Zoster vaccine. One dose is recommended for adults aged 32 years or older unless certain conditions are present.  Measles, mumps, and rubella (MMR) vaccine. Adults born before [birth year] generally are considered immune to measles and mumps. Adults born in [birth year] or later should have 1 or more doses of MMR vaccine unless there is a contraindication to the vaccine or there is laboratory evidence of immunity to each of the three diseases. A routine second dose of MMR vaccine should be obtained at least 28 days after the first dose for students attending postsecondary schools, health care workers, or international travelers. People who received inactivated measles vaccine or an unknown type of measles vaccine during [phone]  should receive 2 doses of MMR vaccine. People who received inactivated mumps vaccine or an unknown type of mumps vaccine before 1979 and are at high risk for mumps infection should consider immunization with 2 doses of MMR vaccine. For females of childbearing age, rubella immunity should be determined. If there is no evidence of immunity, females who are not pregnant should be vaccinated. If there is no evidence of immunity, females who are pregnant should delay immunization until after pregnancy.  Unvaccinated health care workers born before [birth year] who lack laboratory evidence of measles, mumps, or rubella immunity or laboratory confirmation of disease should consider measles and mumps immunization with 2 doses of MMR vaccine or rubella immunization with 1 dose of MMR vaccine.  Pneumococcal 13-valent conjugate (PCV13) vaccine. When indicated, a person who is uncertain of her immunization history and has no record of immunization should receive the PCV13 vaccine. An adult aged 55 years or older who has certain medical conditions and has not been previously immunized should receive 1 dose of PCV13 vaccine. This PCV13 should be followed with a dose of pneumococcal polysaccharide (PPSV23) vaccine. The PPSV23 vaccine dose should be obtained at least 8 weeks after the dose of PCV13 vaccine. An adult aged 87 years or older who has certain medical conditions and previously received 1 or more doses of PPSV23 vaccine should receive 1 dose of PCV13. The PCV13 vaccine dose should be obtained 1 or more years after the last PPSV23 vaccine dose.  Pneumococcal polysaccharide (PPSV23) vaccine. When PCV13 is also indicated, PCV13 should be obtained first. All adults aged 45 years and older should be immunized. An adult younger than age 12 years who has certain medical conditions should be immunized. Any person who resides in a nursing home or long-term care facility should be immunized. An adult smoker should be immunized. People with an immunocompromised condition and certain other conditions should receive both PCV13 and PPSV23 vaccines. People with human immunodeficiency virus (HIV) infection should be immunized as soon as possible after diagnosis. Immunization during chemotherapy or radiation therapy should be avoided. Routine use of PPSV23 vaccine is not recommended for American Indians, [place] Natives, or people younger than 65 years unless there are medical conditions that require PPSV23 vaccine. When indicated,  people who have unknown immunization and have no record of immunization should receive PPSV23 vaccine. One-time revaccination 5 years after the first dose of PPSV23 is recommended for people aged 19-64 years who have chronic kidney failure, nephrotic syndrome, asplenia, or immunocompromised conditions. People who received 1-2 doses of PPSV23 before age 13 years should receive another dose of PPSV23 vaccine at age 27 years or later if at least 5 years have passed since the previous dose. Doses of PPSV23 are not needed for people immunized with PPSV23 at or after age 64 years.  Meningococcal vaccine. Adults with asplenia or persistent complement component deficiencies should receive 2 doses of quadrivalent meningococcal conjugate (MenACWY-D) vaccine. The doses should be obtained at least 2 months apart. Microbiologists working with certain meningococcal bacteria, [employer] recruits, people at risk during an outbreak, and people who travel to or live in countries with a high rate of meningitis should be immunized. A first-year college student up through age 75 years who is living in a residence hall should receive a dose if she did not receive a dose on or after her 16th birthday. Adults who have certain high-risk conditions should receive one or more doses of vaccine.  Hepatitis A vaccine. Adults who wish to be  protected from this disease, have certain high-risk conditions, work with hepatitis A-infected animals, work in hepatitis A research labs, or travel to or work in countries with a high rate of hepatitis A should be immunized. Adults who were previously unvaccinated and who anticipate close contact with an international adoptee during the first 60 days after arrival in the [place] from a country with a high rate of hepatitis A should be immunized.  Hepatitis B vaccine. Adults who wish to be protected from this disease, have certain high-risk conditions, may be exposed to blood or other infectious body  fluids, are household contacts or sex partners of hepatitis B positive people, are clients or workers in certain care facilities, or travel to or work in countries with a high rate of hepatitis B should be immunized.  Preventive Services / Frequency Ages 66 years and over  Blood pressure check.** / Every 1 to 2 years.  Lipid and cholesterol check.** / Every 5 years beginning at age 76 years.  Lung cancer screening. / Every year if you are aged 70-80 years and have a 30-pack-year history of smoking and currently smoke or have quit within the past 15 years. Yearly screening is stopped once you have quit smoking for at least 15 years or develop a health problem that would prevent you from having lung cancer treatment.  Clinical breast exam.** / Every year after age 10 years.  BRCA-related cancer risk assessment.** / For women who have family members with a BRCA-related cancer (breast, ovarian, tubal, or peritoneal cancers).  Mammogram.** / Every year beginning at age 55 years and continuing for as long as you are in good health. Consult with your health care provider.  Pap test.** / Every 3 years starting at age 46 years through age 60 or 47 years with 3 consecutive normal Pap tests. Testing can be stopped between 65 and 70 years with 3 consecutive normal Pap tests and no abnormal Pap or HPV tests in the past 10 years.  HPV screening.** / Every 3 years from ages 75 years through ages 85 or 69 years with a history of 3 consecutive normal Pap tests. Testing can be stopped between 65 and 70 years with 3 consecutive normal Pap tests and no abnormal Pap or HPV tests in the past 10 years.  Fecal occult blood test (FOBT) of stool. / Every year beginning at age 28 years and continuing until age 11 years. You may not need to do this test if you get a colonoscopy every 10 years.  Flexible sigmoidoscopy or colonoscopy.** / Every 5 years for a flexible sigmoidoscopy or every 10 years for a colonoscopy  beginning at age 24 years and continuing until age 2 years.  Hepatitis C blood test.** / For all people born from [birth year] through 1965 and any individual with known risks for hepatitis C.  Osteoporosis screening.** / A one-time screening for women ages 35 years and over and women at risk for fractures or osteoporosis.  Skin self-exam. / Monthly.  Influenza vaccine. / Every year.  Tetanus, diphtheria, and acellular pertussis (Tdap/Td) vaccine.** / 1 dose of Td every 10 years.  Varicella vaccine.** / Consult your health care provider.  Zoster vaccine.** / 1 dose for adults aged 25 years or older.  Pneumococcal 13-valent conjugate (PCV13) vaccine.** / Consult your health care provider.  Pneumococcal polysaccharide (PPSV23) vaccine.** / 1 dose for all adults aged 29 years and older.  Meningococcal vaccine.** / Consult your health care provider.  Hepatitis A vaccine.** /  Consult your health care provider.  Hepatitis B vaccine.** / Consult your health care provider.  Haemophilus influenzae type b (Hib) vaccine.** / Consult your health care provider. ** Family history and personal history of risk and conditions may change your health care provider's recommendations. Document Released: 10/10/2001 Document Revised: 12/29/2013 Document Reviewed: 01/09/2011 [facility] Patient Information 2015 [place], [place]. This information is not intended to replace advice given to you by your health care provider. Make sure you discuss any questions you have with your health care provider.

## 2014-06-29 ENCOUNTER — Encounter: Payer: Self-pay | Admitting: Pharmacist

## 2014-08-24 ENCOUNTER — Other Ambulatory Visit: Payer: Self-pay

## 2014-09-07 ENCOUNTER — Telehealth: Payer: Self-pay | Admitting: Family Medicine

## 2014-09-08 ENCOUNTER — Encounter: Payer: Medicare HMO | Admitting: Gynecology

## 2014-09-16 ENCOUNTER — Other Ambulatory Visit (HOSPITAL_COMMUNITY)
Admission: RE | Admit: 2014-09-16 | Discharge: 2014-09-16 | Disposition: A | Discharge status: Home / Self Care | Payer: Commercial Managed Care - HMO | Source: Ambulatory Visit | Attending: Gynecology | Admitting: Gynecology

## 2014-09-16 ENCOUNTER — Ambulatory Visit (INDEPENDENT_AMBULATORY_CARE_PROVIDER_SITE_OTHER): Payer: Commercial Managed Care - HMO | Admitting: Gynecology

## 2014-09-16 ENCOUNTER — Encounter: Payer: Self-pay | Admitting: Gynecology

## 2014-09-16 VITALS — BP 120/76 | Ht 63.0 in | Wt 152.0 lb

## 2014-09-16 DIAGNOSIS — N8111 Cystocele, midline: Secondary | ICD-10-CM

## 2014-09-16 DIAGNOSIS — N952 Postmenopausal atrophic vaginitis: Secondary | ICD-10-CM | POA: Diagnosis not present

## 2014-09-16 DIAGNOSIS — N816 Rectocele: Secondary | ICD-10-CM | POA: Diagnosis not present

## 2014-09-16 DIAGNOSIS — N814 Uterovaginal prolapse, unspecified: Secondary | ICD-10-CM | POA: Diagnosis not present

## 2014-09-16 DIAGNOSIS — Z01419 Encounter for gynecological examination (general) (routine) without abnormal findings: Secondary | ICD-10-CM | POA: Diagnosis not present

## 2014-09-16 DIAGNOSIS — Z124 Encounter for screening for malignant neoplasm of cervix: Secondary | ICD-10-CM

## 2014-09-16 NOTE — Addendum Note (Signed)
Addended by: Nelva Nay on: 09/16/2014 11:12 AM   Modules accepted: Orders, SmartSet

## 2014-09-16 NOTE — Progress Notes (Signed)
Kimberly Suarez Aug 20, 1941 [id number]        74 y.o.  G3P3003 for breast and pelvic exam. Several issues noted below  Past medical history,surgical history, problem list, medications, allergies, family history and social history were all reviewed and documented as reviewed in the EPIC chart.  ROS:  Performed with pertinent positives and negatives included in the history, assessment and plan.   Additional significant findings :  none   Exam: Kimberly Suarez Vitals:   09/16/14 1037  BP: 120/76  Height: 5\' 3"  (1.6 m)  Weight: 152 lb (68.947 kg)   General appearance:  Normal affect, orientation and appearance. Skin: Grossly normal with numerous seborrheic keratoses over chest and back HEENT: Without gross lesions.  No cervical or supraclavicular adenopathy. Thyroid normal.  Lungs:  Clear without wheezing, rales or rhonchi Cardiac: RR, without RMG Abdominal:  Soft, nontender, without masses, guarding, rebound, organomegaly or hernia Breasts:  Examined lying and sitting without masses, retractions, discharge or axillary adenopathy. Pelvic:  Ext/BUS/vagina with generalized atrophic changes. First to second degree uterine prolapse. First to second degree cystocele. First degree rectocele  Cervix atrophic. Pap smear done  Uterus axial to anteverted, normal size, shape and contour, midline and mobile nontender   Adnexa  Without masses or tenderness    Anus and perineum  Normal   Rectovaginal  Normal sphincter tone without palpated masses or tenderness.    Assessment/Plan:  74 y.o. G8P3003 female for breast and pelvic exam.   1. Postmenopausal/atrophic genital changes. Patient doing well without significant hot flushes, night sweats or any vaginal bleeding. Using Estrace vaginal cream up to twice weekly for vaginal dryness and is doing well with this. Has a supply at home and will call when she needs more.  She does admit to using it less often does well with this. We have discussed the  risks of absorption with systemic effects include stroke heart attack DVT endometrial stimulation and breast cancer.  Patient knows to report any vaginal bleeding. 2. Uterine prolapse/cystocele/rectocele. Patients using ring pessary with good response. She is able to remove it herself and uses it intermittently pending on how active she is. Exam is stable without evidence of irritation or erosion. We'll continue as is and follow up if any issues develop. 3. Pap smear 2013.  Pap smear done today.  History of ASCUS 2011 with repeat showing ASCUS negative high risk HPV 2011. Pap smears 2012, 2013 normal. 4. DEXA 2014 normal. Plan repeat at 5 year interval. Increase calcium vitamin D reviewed. 5. Colonoscopy 2012. Repeat at their recommended interval. 6. Mammography 09/2013. Patient reminded to schedule next month. SBE monthly reviewed. 7. Health maintenance. No routine blood work done as this is done at her primary physician's office. Follow up in one year, sooner as needed.     [name], 11:04 AM 09/16/2014

## 2014-09-16 NOTE — Patient Instructions (Signed)
You may obtain a copy of any labs that were done today by logging onto MyChart as outlined in the instructions provided with your AVS (after visit summary). The office will not call with normal lab results but certainly if there are any significant abnormalities then we will contact you.   Health Maintenance, Female A healthy lifestyle and preventative care can promote health and wellness.  Maintain regular health, dental, and eye exams.  Eat a healthy diet. Foods like vegetables, fruits, whole grains, low-fat dairy products, and lean protein foods contain the nutrients you need without too many calories. Decrease your intake of foods high in solid fats, added sugars, and salt. Get information about a proper diet from your caregiver, if necessary.  Regular physical exercise is one of the most important things you can do for your health. Most adults should get at least 150 minutes of moderate-intensity exercise (any activity that increases your heart rate and causes you to sweat) each week. In addition, most adults need muscle-strengthening exercises on 2 or more days a week.   Maintain a healthy weight. The body mass index (BMI) is a screening tool to identify possible weight problems. It provides an estimate of body fat based on height and weight. Your caregiver can help determine your BMI, and can help you achieve or maintain a healthy weight. For adults 20 years and older:  A BMI below 18.5 is considered underweight.  A BMI of 18.5 to 24.9 is normal.  A BMI of 25 to 29.9 is considered overweight.  A BMI of 30 and above is considered obese.  Maintain normal blood lipids and cholesterol by exercising and minimizing your intake of saturated fat. Eat a balanced diet with plenty of fruits and vegetables. Blood tests for lipids and cholesterol should begin at age 61 and be repeated every 5 years. If your lipid or cholesterol levels are high, you are over 50, or you are a high risk for heart  disease, you may need your cholesterol levels checked more frequently.Ongoing high lipid and cholesterol levels should be treated with medicines if diet and exercise are not effective.  If you smoke, find out from your caregiver how to quit. If you do not use tobacco, do not start.  Lung cancer screening is recommended for adults aged 33 80 years who are at high risk for developing lung cancer because of a history of smoking. Yearly low-dose computed tomography (CT) is recommended for people who have at least a 30-pack-year history of smoking and are a current smoker or have quit within the past 15 years. A pack year of smoking is smoking an average of 1 pack of cigarettes a day for 1 year (for example: 1 pack a day for 30 years or 2 packs a day for 15 years). Yearly screening should continue until the smoker has stopped smoking for at least 15 years. Yearly screening should also be stopped for people who develop a health problem that would prevent them from having lung cancer treatment.  If you are pregnant, do not drink alcohol. If you are breastfeeding, be very cautious about drinking alcohol. If you are not pregnant and choose to drink alcohol, do not exceed 1 drink per day. One drink is considered to be 12 ounces (355 mL) of beer, 5 ounces (148 mL) of wine, or 1.5 ounces (44 mL) of liquor.  Avoid use of street drugs. Do not share needles with anyone. Ask for help if you need support or instructions about stopping  the use of drugs.  High blood pressure causes heart disease and increases the risk of stroke. Blood pressure should be checked at least every 1 to 2 years. Ongoing high blood pressure should be treated with medicines, if weight loss and exercise are not effective.  If you are 59 to 74 years old, ask your caregiver if you should take aspirin to prevent strokes.  Diabetes screening involves taking a blood sample to check your fasting blood sugar level. This should be done once every 3  years, after age 91, if you are within normal weight and without risk factors for diabetes. Testing should be considered at a younger age or be carried out more frequently if you are overweight and have at least 1 risk factor for diabetes.  Breast cancer screening is essential preventative care for women. You should practice "breast self-awareness." This means understanding the normal appearance and feel of your breasts and may include breast self-examination. Any changes detected, no matter how small, should be reported to a caregiver. Women in their 66s and 30s should have a clinical breast exam (CBE) by a caregiver as part of a regular health exam every 1 to 3 years. After age 101, women should have a CBE every year. Starting at age 100, women should consider having a mammogram (breast X-ray) every year. Women who have a family history of breast cancer should talk to their caregiver about genetic screening. Women at a high risk of breast cancer should talk to their caregiver about having an MRI and a mammogram every year.  Breast cancer gene (BRCA)-related cancer risk assessment is recommended for women who have family members with BRCA-related cancers. BRCA-related cancers include breast, ovarian, tubal, and peritoneal cancers. Having family members with these cancers may be associated with an increased risk for harmful changes (mutations) in the breast cancer genes BRCA1 and BRCA2. Results of the assessment will determine the need for genetic counseling and BRCA1 and BRCA2 testing.  The Pap test is a screening test for cervical cancer. Women should have a Pap test starting at age 57. Between ages 25 and 35, Pap tests should be repeated every 2 years. Beginning at age 37, you should have a Pap test every 3 years as long as the past 3 Pap tests have been normal. If you had a hysterectomy for a problem that was not cancer or a condition that could lead to cancer, then you no longer need Pap tests. If you are  between ages 50 and 76, and you have had normal Pap tests going back 10 years, you no longer need Pap tests. If you have had past treatment for cervical cancer or a condition that could lead to cancer, you need Pap tests and screening for cancer for at least 20 years after your treatment. If Pap tests have been discontinued, risk factors (such as a new sexual partner) need to be reassessed to determine if screening should be resumed. Some women have medical problems that increase the chance of getting cervical cancer. In these cases, your caregiver may recommend more frequent screening and Pap tests.  The human papillomavirus (HPV) test is an additional test that may be used for cervical cancer screening. The HPV test looks for the virus that can cause the cell changes on the cervix. The cells collected during the Pap test can be tested for HPV. The HPV test could be used to screen women aged 44 years and older, and should be used in women of any age  who have unclear Pap test results. After the age of 55, women should have HPV testing at the same frequency as a Pap test.  Colorectal cancer can be detected and often prevented. Most routine colorectal cancer screening begins at the age of 44 and continues through age 20. However, your caregiver may recommend screening at an earlier age if you have risk factors for colon cancer. On a yearly basis, your caregiver may provide home test kits to check for hidden blood in the stool. Use of a small camera at the end of a tube, to directly examine the colon (sigmoidoscopy or colonoscopy), can detect the earliest forms of colorectal cancer. Talk to your caregiver about this at age 86, when routine screening begins. Direct examination of the colon should be repeated every 5 to 10 years through age 13, unless early forms of pre-cancerous polyps or small growths are found.  Hepatitis C blood testing is recommended for all people born from [birth year] through 1965 and any  individual with known risks for hepatitis C.  Practice safe sex. Use condoms and avoid high-risk sexual practices to reduce the spread of sexually transmitted infections (STIs). Sexually active women aged 36 and younger should be checked for Chlamydia, which is a common sexually transmitted infection. Older women with new or multiple partners should also be tested for Chlamydia. Testing for other STIs is recommended if you are sexually active and at increased risk.  Osteoporosis is a disease in which the bones lose minerals and strength with aging. This can result in serious bone fractures. The risk of osteoporosis can be identified using a bone density scan. Women ages 20 and over and women at risk for fractures or osteoporosis should discuss screening with their caregivers. Ask your caregiver whether you should be taking a calcium supplement or vitamin D to reduce the rate of osteoporosis.  Menopause can be associated with physical symptoms and risks. Hormone replacement therapy is available to decrease symptoms and risks. You should talk to your caregiver about whether hormone replacement therapy is right for you.  Use sunscreen. Apply sunscreen liberally and repeatedly throughout the day. You should seek shade when your shadow is shorter than you. Protect yourself by wearing long sleeves, pants, a wide-brimmed hat, and sunglasses year round, whenever you are outdoors.  Notify your caregiver of new moles or changes in moles, especially if there is a change in shape or color. Also notify your caregiver if a mole is larger than the size of a pencil eraser.  Stay current with your immunizations. Document Released: 02/27/2011 Document Revised: 12/09/2012 Document Reviewed: 02/27/2011 [facility] Patient Information 2014 [place].

## 2014-09-17 LAB — URINALYSIS W MICROSCOPIC + REFLEX CULTURE
Bilirubin Urine: NEGATIVE
Casts: NONE SEEN
Crystals: NONE SEEN
Glucose, UA: NEGATIVE mg/dL
Hgb urine dipstick: NEGATIVE
Ketones, ur: NEGATIVE mg/dL
Nitrite: NEGATIVE
Protein, ur: NEGATIVE mg/dL
Specific Gravity, Urine: 1.013 (ref 1.005–1.030)
Urobilinogen, UA: 0.2 mg/dL (ref 0.0–1.0)
pH: 6 (ref 5.0–8.0)

## 2014-09-17 LAB — CYTOLOGY - PAP

## 2014-09-18 LAB — URINE CULTURE

## 2014-10-20 ENCOUNTER — Telehealth: Payer: Self-pay | Admitting: Family Medicine

## 2014-10-20 NOTE — Telephone Encounter (Signed)
Patient will need to be seen to evaluate which x-ray is needed. appt scheduled for 10/22/14. Patient aware.

## 2014-10-22 ENCOUNTER — Ambulatory Visit: Payer: Commercial Managed Care - HMO | Admitting: Family Medicine

## 2014-10-23 ENCOUNTER — Encounter: Payer: Self-pay | Admitting: Family Medicine

## 2014-10-23 ENCOUNTER — Ambulatory Visit (INDEPENDENT_AMBULATORY_CARE_PROVIDER_SITE_OTHER): Payer: Commercial Managed Care - HMO | Admitting: Family Medicine

## 2014-10-23 ENCOUNTER — Ambulatory Visit (INDEPENDENT_AMBULATORY_CARE_PROVIDER_SITE_OTHER): Payer: Commercial Managed Care - HMO

## 2014-10-23 VITALS — BP 111/69 | HR 84 | Temp 97.5°F | Ht 63.0 in | Wt 156.0 lb

## 2014-10-23 DIAGNOSIS — Q762 Congenital spondylolisthesis: Secondary | ICD-10-CM

## 2014-10-23 DIAGNOSIS — M5136 Other intervertebral disc degeneration, lumbar region: Secondary | ICD-10-CM

## 2014-10-23 DIAGNOSIS — G629 Polyneuropathy, unspecified: Secondary | ICD-10-CM

## 2014-10-23 DIAGNOSIS — M4310 Spondylolisthesis, site unspecified: Secondary | ICD-10-CM

## 2014-10-23 DIAGNOSIS — R29898 Other symptoms and signs involving the musculoskeletal system: Secondary | ICD-10-CM

## 2014-10-23 DIAGNOSIS — M545 Low back pain: Secondary | ICD-10-CM

## 2014-10-23 DIAGNOSIS — M4120 Other idiopathic scoliosis, site unspecified: Secondary | ICD-10-CM

## 2014-10-23 DIAGNOSIS — M51369 Other intervertebral disc degeneration, lumbar region without mention of lumbar back pain or lower extremity pain: Secondary | ICD-10-CM

## 2014-10-23 NOTE — Progress Notes (Signed)
Subjective:    Patient ID: Kimberly Suarez, female    DOB: [birth year], 74 y.o.   MRN: [id number]  HPI Patient here today for low back pain and numbness of right thigh. The patient has had worsening back pain and numbness of the right leg for the past 6 months. It is especially painful when she arises and gets up and sits down and positions her body in different positions.       Patient Active Problem List   Diagnosis Date Noted  . Pre-diabetes 06/01/2014  . Renal insufficiency 11/18/2013  . Osteoarthritis of left knee 11/18/2013  . HTN (hypertension) 06/26/2013  . Lymphocytic colitis 10/30/2012  . Hyperlipidemia 10/30/2012  . Unspecified constipation 07/10/2011  . Dysphagia, unspecified 07/10/2011  . Diverticulosis of colon (without mention of hemorrhage) 07/10/2011  . Special screening for malignant neoplasms, colon 07/10/2011  . GERD with stricture 07/10/2011   Outpatient Encounter Prescriptions as of 10/23/2014  Medication Sig  . aspirin 81 MG tablet Take 81 mg by mouth daily.    Marland Kitchen atorvastatin (LIPITOR) 80 MG tablet Take 40 mg by mouth daily.  . Calcium Carbonate-Simethicone (TUMS PLUS PO) Take 1 tablet by mouth as needed.  . enalapril (VASOTEC) 10 MG tablet Take 1 tablet (10 mg total) by mouth 2 (two) times daily.  Marland Kitchen estradiol (ESTRACE) 0.1 MG/GM vaginal cream Place 1 Applicatorful vaginally at bedtime.  . Glucosamine-Chondroitin (GLUCOSAMINE CHONDR COMPLEX PO) Take 1 tablet by mouth 2 (two) times daily.   . hydrochlorothiazide (HYDRODIURIL) 25 MG tablet TAKE 1 TABLET EVERY DAY  . Multiple Vitamins-Minerals (HAIR/SKIN/NAILS) TABS Take 1 tablet by mouth daily.  . Omega-3 Fatty Acids (FISH OIL) 1000 MG CAPS Take 1 capsule by mouth 2 (two) times daily.   . Probiotic Product (PROBIOTIC DAILY PO) Take by mouth. Takes INSYNC probiotics 1 capsules daily    Review of Systems  Constitutional: Negative.   HENT: Negative.   Eyes: Negative.   Respiratory: Negative.     Cardiovascular: Negative.   Gastrointestinal: Negative.   Endocrine: Negative.   Genitourinary: Negative.   Musculoskeletal: Positive for back pain.  Skin: Negative.   Allergic/Immunologic: Negative.   Neurological: Positive for numbness (of outer right thigh).  Hematological: Negative.   Psychiatric/Behavioral: Negative.        Objective:     BP 111/69 mmHg  Pulse 84  Temp(Src) 97.5 F (36.4 C) (Oral)  Ht 5\' 3"  (1.6 m)  Wt 156 lb (70.761 kg)  BMI 27.64 kg/m2    Physical Exam  Constitutional: She is oriented to person, place, and time. She appears well-developed and well-nourished. No distress.  HENT:  Head: Normocephalic.  Eyes: Conjunctivae and EOM are normal. Pupils are equal, round, and reactive to light. Left eye exhibits no discharge. No scleral icterus.  Neck: Normal range of motion. Neck supple.  Musculoskeletal: Normal range of motion. She exhibits no edema or tenderness.  The patient has good flexion and extension of the legs. Leg raising is good bilaterally. There is minimal pain although some pain. In her back and thigh. She is tender in the lumbar and lumbosacral area.  Neurological: She is alert and oriented to person, place, and time.  Skin: Skin is warm and dry. No rash noted.  Psychiatric: She has a normal mood and affect. Her behavior is normal. Judgment and thought content normal.  Nursing note and vitals reviewed.  [facility] reading (PRIMARY) by  Dr.Alysa Duca-LS spine--degenerative disc disease anterolisthesis and scoliosis  Assessment & Plan:  1. Low back pain, unspecified back pain laterality, with sciatica presence unspecified - DG Lumbar Spine 2-3 Views; Future  2. Idiopathic scoliosis  3. Degenerative disc disease, lumbar -She can take Tylenol if needed for pain  4. Anterolisthesis -Needs MRI  5. Peripheral neuropathy -Needs MRI  6. Weakness of right lower extremity -Needs MRI  Patient  Instructions  You must be careful and do not put yourself at risk for falling Take Tylenol if needed for pain Do not do any climbing activities We will arrange to get the MRI of your back and we'll most likely refer you to a neurosurgeon for further evaluation because of the symptoms you're having   Arrie Senate MD

## 2014-10-23 NOTE — Patient Instructions (Signed)
You must be careful and do not put yourself at risk for falling Take Tylenol if needed for pain Do not do any climbing activities We will arrange to get the MRI of your back and we'll most likely refer you to a neurosurgeon for further evaluation because of the symptoms you're having

## 2014-10-26 ENCOUNTER — Other Ambulatory Visit: Payer: Self-pay | Admitting: *Deleted

## 2014-10-26 DIAGNOSIS — M545 Low back pain: Secondary | ICD-10-CM

## 2014-10-28 ENCOUNTER — Telehealth: Payer: Self-pay | Admitting: Family Medicine

## 2014-11-16 ENCOUNTER — Other Ambulatory Visit (INDEPENDENT_AMBULATORY_CARE_PROVIDER_SITE_OTHER): Payer: Commercial Managed Care - HMO

## 2014-11-16 DIAGNOSIS — M4310 Spondylolisthesis, site unspecified: Secondary | ICD-10-CM

## 2014-11-16 DIAGNOSIS — Q762 Congenital spondylolisthesis: Secondary | ICD-10-CM | POA: Diagnosis not present

## 2014-11-16 DIAGNOSIS — R7303 Prediabetes: Secondary | ICD-10-CM

## 2014-11-16 DIAGNOSIS — K219 Gastro-esophageal reflux disease without esophagitis: Secondary | ICD-10-CM

## 2014-11-16 DIAGNOSIS — N289 Disorder of kidney and ureter, unspecified: Secondary | ICD-10-CM

## 2014-11-16 DIAGNOSIS — I10 Essential (primary) hypertension: Secondary | ICD-10-CM

## 2014-11-16 DIAGNOSIS — E559 Vitamin D deficiency, unspecified: Secondary | ICD-10-CM

## 2014-11-16 DIAGNOSIS — G629 Polyneuropathy, unspecified: Secondary | ICD-10-CM

## 2014-11-16 DIAGNOSIS — K222 Esophageal obstruction: Secondary | ICD-10-CM

## 2014-11-17 LAB — CBC WITH DIFFERENTIAL
Basophils Absolute: 0 10*3/uL (ref 0.0–0.2)
Basos: 1 %
Eos: 6 %
Eosinophils Absolute: 0.3 10*3/uL (ref 0.0–0.4)
HCT: 40.9 % (ref 34.0–46.6)
Hemoglobin: 13.5 g/dL (ref 11.1–15.9)
Immature Grans (Abs): 0 10*3/uL (ref 0.0–0.1)
Immature Granulocytes: 0 %
Lymphocytes Absolute: 2.1 10*3/uL (ref 0.7–3.1)
Lymphs: 38 %
MCH: 29.2 pg (ref 26.6–33.0)
MCHC: 33 g/dL (ref 31.5–35.7)
MCV: 89 fL (ref 79–97)
Monocytes Absolute: 0.4 10*3/uL (ref 0.1–0.9)
Monocytes: 8 %
Neutrophils Absolute: 2.6 10*3/uL (ref 1.4–7.0)
Neutrophils Relative %: 47 %
RBC: 4.62 x10E6/uL (ref 3.77–5.28)
RDW: 14 % (ref 12.3–15.4)
WBC: 5.5 10*3/uL (ref 3.4–10.8)

## 2014-11-17 LAB — HEPATIC FUNCTION PANEL
ALT: 15 [IU]/L (ref 0–32)
AST: 18 [IU]/L (ref 0–40)
Albumin: 4 g/dL (ref 3.5–4.8)
Alkaline Phosphatase: 40 [IU]/L (ref 39–117)
Bilirubin Total: 0.2 mg/dL (ref 0.0–1.2)
Bilirubin, Direct: 0.08 mg/dL (ref 0.00–0.40)
Total Protein: 6.8 g/dL (ref 6.0–8.5)

## 2014-11-17 LAB — NMR, LIPOPROFILE
Cholesterol: 175 mg/dL (ref 100–199)
HDL Cholesterol by NMR: 46 mg/dL
HDL Particle Number: 30.1 umol/L — ABNORMAL LOW
LDL Particle Number: 1249 nmol/L — ABNORMAL HIGH
LDL Size: 19.8 nm
LDL-C: 85 mg/dL (ref 0–99)
LP-IR Score: 55 — ABNORMAL HIGH
Small LDL Particle Number: 913 nmol/L — ABNORMAL HIGH
Triglycerides by NMR: 218 mg/dL — ABNORMAL HIGH (ref 0–149)

## 2014-11-17 LAB — BMP8+EGFR
BUN/Creatinine Ratio: 21 (ref 11–26)
BUN: 25 mg/dL (ref 8–27)
CO2: 25 mmol/L (ref 18–29)
Calcium: 9.5 mg/dL (ref 8.7–10.3)
Chloride: 99 mmol/L (ref 97–108)
Creatinine, Ser: 1.21 mg/dL — ABNORMAL HIGH (ref 0.57–1.00)
GFR calc Af Amer: 51 mL/min/{1.73_m2} — ABNORMAL LOW
GFR calc non Af Amer: 44 mL/min/{1.73_m2} — ABNORMAL LOW
Glucose: 112 mg/dL — ABNORMAL HIGH (ref 65–99)
Potassium: 4 mmol/L (ref 3.5–5.2)
Sodium: 139 mmol/L (ref 134–144)

## 2014-11-17 LAB — VITAMIN D 25 HYDROXY (VIT D DEFICIENCY, FRACTURES): Vit D, 25-Hydroxy: 38.7 ng/mL (ref 30.0–100.0)

## 2014-11-17 NOTE — Progress Notes (Signed)
Patient aware of results.

## 2014-11-23 ENCOUNTER — Encounter: Payer: Self-pay | Admitting: Family Medicine

## 2014-11-23 ENCOUNTER — Ambulatory Visit (INDEPENDENT_AMBULATORY_CARE_PROVIDER_SITE_OTHER): Payer: Commercial Managed Care - HMO | Admitting: Family Medicine

## 2014-11-23 VITALS — BP 112/69 | HR 67 | Temp 97.5°F | Ht 63.0 in | Wt 156.0 lb

## 2014-11-23 DIAGNOSIS — R7309 Other abnormal glucose: Secondary | ICD-10-CM | POA: Diagnosis not present

## 2014-11-23 DIAGNOSIS — R7303 Prediabetes: Secondary | ICD-10-CM

## 2014-11-23 DIAGNOSIS — I10 Essential (primary) hypertension: Secondary | ICD-10-CM

## 2014-11-23 DIAGNOSIS — N289 Disorder of kidney and ureter, unspecified: Secondary | ICD-10-CM | POA: Diagnosis not present

## 2014-11-23 DIAGNOSIS — H6123 Impacted cerumen, bilateral: Secondary | ICD-10-CM

## 2014-11-23 DIAGNOSIS — M4716 Other spondylosis with myelopathy, lumbar region: Secondary | ICD-10-CM

## 2014-11-23 DIAGNOSIS — K222 Esophageal obstruction: Secondary | ICD-10-CM | POA: Diagnosis not present

## 2014-11-23 DIAGNOSIS — S22070D Wedge compression fracture of T9-T10 vertebra, subsequent encounter for fracture with routine healing: Secondary | ICD-10-CM

## 2014-11-23 DIAGNOSIS — K219 Gastro-esophageal reflux disease without esophagitis: Secondary | ICD-10-CM

## 2014-11-23 MED ORDER — HYDROCHLOROTHIAZIDE 25 MG PO TABS: 25.0000 mg | ORAL_TABLET | Freq: Every day | ORAL | Status: DC

## 2014-11-23 NOTE — Progress Notes (Signed)
Subjective:    Patient ID: Kimberly Suarez, female    DOB: [birth year], 74 y.o.   MRN: [id number]  HPI Pt here for follow up and management of chronic medical problems which includes hypertension and GERD. She is taking medications regularly. It is of significance that the patient had a pelvic exam in January. She had x-rays of her back here in the office and it showed a T10 compression fracture. She subsequently has seen the orthopedic surgeon and has not had an MRI done up to this point. They are arranging for her to see the spine and specialists at the orthopedic surgeon's office for possible injections. Presently she is doing better with her back pain and will make an appointment with him if she continues to have problems. She is trying to exercise regularly at the [facility]. She will be given an FOBT to return today and she will have her lab work reviewed with her during the visit today. On review of her lab work, the liver function tests are normal. The CBC is good with a normal hemoglobin at 13.5. The blood sugar slightly elevated at 112 and the creatinine remains elevated at 1.21. All liver function tests are normal. Cholesterol numbers with advanced lipid testing have a LDL particle number that was now 1240 and in the past it was 929. The triglycerides are also elevated at 218. The patient indicates she has not been taking her cholesterol medicine as regularly as she should she just wanted to see if her numbers would stay down. The vitamin D is good and she was instructed to continue this.         Patient Active Problem List   Diagnosis Date Noted  . Pre-diabetes 06/01/2014  . Renal insufficiency 11/18/2013  . Osteoarthritis of left knee 11/18/2013  . HTN (hypertension) 06/26/2013  . Lymphocytic colitis 10/30/2012  . Hyperlipidemia 10/30/2012  . Unspecified constipation 07/10/2011  . Dysphagia, unspecified 07/10/2011  . Diverticulosis of colon (without mention of hemorrhage) 07/10/2011    . Special screening for malignant neoplasms, colon 07/10/2011  . GERD with stricture 07/10/2011   Outpatient Encounter Prescriptions as of 11/23/2014  Medication Sig  . aspirin 81 MG tablet Take 81 mg by mouth daily.    Marland Kitchen atorvastatin (LIPITOR) 80 MG tablet Take 40 mg by mouth daily.  . Calcium Carbonate-Simethicone (TUMS PLUS PO) Take 1 tablet by mouth as needed.  . enalapril (VASOTEC) 10 MG tablet Take 1 tablet (10 mg total) by mouth 2 (two) times daily.  Marland Kitchen estradiol (ESTRACE) 0.1 MG/GM vaginal cream Place 1 Applicatorful vaginally at bedtime.  . Glucosamine-Chondroitin (GLUCOSAMINE CHONDR COMPLEX PO) Take 1 tablet by mouth 2 (two) times daily.   . hydrochlorothiazide (HYDRODIURIL) 25 MG tablet Take 1 tablet (25 mg total) by mouth daily.  . Multiple Vitamins-Minerals (HAIR/SKIN/NAILS) TABS Take 1 tablet by mouth daily.  . Omega-3 Fatty Acids (FISH OIL) 1000 MG CAPS Take 1 capsule by mouth 2 (two) times daily.   . Probiotic Product (PROBIOTIC DAILY PO) Take by mouth. Takes INSYNC probiotics 1 capsules daily  . [DISCONTINUED] hydrochlorothiazide (HYDRODIURIL) 25 MG tablet TAKE 1 TABLET EVERY DAY    Review of Systems  Constitutional: Negative.   HENT: Positive for ear pain (left ear stopped up).   Eyes: Negative.   Respiratory: Negative.   Cardiovascular: Negative.   Gastrointestinal: Negative.   Endocrine: Negative.   Genitourinary: Negative.   Musculoskeletal: Positive for arthralgias (bilateral knee pain - right is worse - sees ortho) and  neck pain.  Skin: Negative.   Allergic/Immunologic: Negative.   Neurological: Negative.   Hematological: Negative.   Psychiatric/Behavioral: Negative.        Objective:   Physical Exam  Constitutional: She is oriented to person, place, and time. She appears well-developed and well-nourished. No distress.  The patient is pleasant and alert.  HENT:  Head: Normocephalic and atraumatic.  Right Ear: External ear normal.  Nose: Nose normal.   Mouth/Throat: Oropharynx is clear and moist.  The patient has impacted cerumen in the left ear canal  Eyes: Conjunctivae and EOM are normal. Pupils are equal, round, and reactive to light. Right eye exhibits no discharge. Left eye exhibits no discharge. No scleral icterus.  Neck: Normal range of motion. Neck supple. No thyromegaly present.  No carotid bruits. No thyromegaly or anterior cervical adenopathy  Cardiovascular: Normal rate, regular rhythm, normal heart sounds and intact distal pulses.  Exam reveals no gallop and no friction rub.   No murmur heard. At 84/m  Pulmonary/Chest: Effort normal and breath sounds normal. No respiratory distress. She has no wheezes. She has no rales. She exhibits no tenderness.  Abdominal: Soft. Bowel sounds are normal. She exhibits no mass. There is tenderness. There is no rebound and no guarding.  The patient has slight epigastric tenderness and this is no different from the past. There were no masses or abdominal bruits.  Musculoskeletal: Normal range of motion. She exhibits no edema or tenderness.  There were arthritic changes in both knees. Is no tenderness of the thoracic or lumbar spine.  Lymphadenopathy:    She has no cervical adenopathy.  Neurological: She is alert and oriented to person, place, and time. She has normal reflexes. No cranial nerve deficit.  DTRs were slightly decreased in both lower extremities  Skin: Skin is warm and dry. No rash noted.  Psychiatric: She has a normal mood and affect. Her behavior is normal. Judgment and thought content normal.  Nursing note and vitals reviewed.  BP 112/69 mmHg  Pulse 67  Temp(Src) 97.5 F (36.4 C) (Oral)  Ht 5\' 3"  (1.6 m)  Wt 156 lb (70.761 kg)  BMI 27.64 kg/m2        Assessment & Plan:  1. Essential hypertension -The blood pressure is good today and the patient should continue with her current medication  2. Pre-diabetes -The patient should continue with her aggressive therapeutic  lifestyle changes and watching her diet as closely as possible  3. Renal insufficiency -The patient's renal insufficiency was stable and the creatinine was slightly improved from the previous check.  4. GERD with stricture -The patient still has epigastric tenderness and takes over-the-counter medication when she has more problems with this.  5. Impacted cerumen of both ears -She has ear wax impaction in the left ear canal and will use Debrox, and return to the office for ear irrigation  6. Traumatic compression fracture of T10 thoracic vertebra, with routine healing, subsequent encounter -This has stabilized and the patient is having minimal problems with this at the present time  7. Lumbar spondylosis with myelopathy -This is also stabilized and the patient can call and make an appointment with orthopedic surgeon if problems flare or she continues to have more pain.  Meds ordered this encounter  Medications  . hydrochlorothiazide (HYDRODIURIL) 25 MG tablet    Sig: Take 1 tablet (25 mg total) by mouth daily.    Dispense:  90 tablet    Refill:  3   Patient Instructions  Medicare Annual Wellness Visit  [facility] and the medical providers at [facility] strive to bring you the best medical care.  In doing so we not only want to address your current medical conditions and concerns but also to detect new conditions early and prevent illness, disease and health-related problems.    Medicare offers a yearly Wellness Visit which allows our clinical staff to assess your need for preventative services including immunizations, lifestyle education, counseling to decrease risk of preventable diseases and screening for fall risk and other medical concerns.    This visit is provided free of charge (no copay) for all Medicare recipients. The clinical pharmacists at [facility] have begun to conduct these Wellness Visits which will  also include a thorough review of all your medications.    As you primary medical provider recommend that you make an appointment for your Annual Wellness Visit if you have not done so already this year.  You may set up this appointment before you leave today or you may call back [phone]) and schedule an appointment.  Please make sure when you call that you mention that you are scheduling your Annual Wellness Visit with the clinical pharmacist so that the appointment may be made for the proper length of time.     Continue current medications. Continue good therapeutic lifestyle changes which include good diet and exercise. Fall precautions discussed with patient. If an FOBT was given today- please return it to our front desk. If you are over 33 years old - you may need Prevnar 75 or the adult Pneumonia vaccine.  Flu Shots are still available at our office. If you still haven't had one please call to set up a nurse visit to get one.   After your visit with [place] today you will receive a survey in the mail or online from [employer] regarding your care with [place]. Please take a moment to fill this out. Your feedback is very important to [place] as you can help [place] better understand your patient needs as well as improve your experience and satisfaction. WE CARE ABOUT YOU!!!   The patient should continue to exercise regularly and at the same time be careful when she is does not put herself at risk for falling She should restart her cholesterol medicine and take it regularly every day She should use Debrox eardrops for 3 nights in a row wait a week and repeat this to the left ear. She should then come in a few days later and have the nurse irrigate the left ear canal to get the wax out of the ear canal.    Arrie Senate MD

## 2014-11-23 NOTE — Patient Instructions (Addendum)
Medicare Annual Wellness Visit  [facility] and the medical providers at [facility] strive to bring you the best medical care.  In doing so we not only want to address your current medical conditions and concerns but also to detect new conditions early and prevent illness, disease and health-related problems.    Medicare offers a yearly Wellness Visit which allows our clinical staff to assess your need for preventative services including immunizations, lifestyle education, counseling to decrease risk of preventable diseases and screening for fall risk and other medical concerns.    This visit is provided free of charge (no copay) for all Medicare recipients. The clinical pharmacists at [facility] have begun to conduct these Wellness Visits which will also include a thorough review of all your medications.    As you primary medical provider recommend that you make an appointment for your Annual Wellness Visit if you have not done so already this year.  You may set up this appointment before you leave today or you may call back [phone]) and schedule an appointment.  Please make sure when you call that you mention that you are scheduling your Annual Wellness Visit with the clinical pharmacist so that the appointment may be made for the proper length of time.     Continue current medications. Continue good therapeutic lifestyle changes which include good diet and exercise. Fall precautions discussed with patient. If an FOBT was given today- please return it to our front desk. If you are over 57 years old - you may need Prevnar 58 or the adult Pneumonia vaccine.  Flu Shots are still available at our office. If you still haven't had one please call to set up a nurse visit to get one.   After your visit with [place] today you will receive a survey in the mail or online from [employer] regarding your care with [place]. Please take a moment to  fill this out. Your feedback is very important to [place] as you can help [place] better understand your patient needs as well as improve your experience and satisfaction. WE CARE ABOUT YOU!!!   The patient should continue to exercise regularly and at the same time be careful when she is does not put herself at risk for falling She should restart her cholesterol medicine and take it regularly every day She should use Debrox eardrops for 3 nights in a row wait a week and repeat this to the left ear. She should then come in a few days later and have the nurse irrigate the left ear canal to get the wax out of the ear canal.

## 2014-12-17 ENCOUNTER — Other Ambulatory Visit: Payer: Commercial Managed Care - HMO

## 2014-12-17 NOTE — Progress Notes (Signed)
Lab only 

## 2014-12-19 LAB — FECAL OCCULT BLOOD, IMMUNOCHEMICAL: Fecal Occult Bld: NEGATIVE

## 2015-01-26 ENCOUNTER — Encounter: Payer: Self-pay | Admitting: *Deleted

## 2015-01-28 ENCOUNTER — Other Ambulatory Visit: Payer: Self-pay | Admitting: Family Medicine

## 2015-03-25 ENCOUNTER — Telehealth: Payer: Self-pay | Admitting: Family Medicine

## 2015-03-25 NOTE — Telephone Encounter (Signed)
Pt called  Thin diarrhea happening off and on   Aware to try otc immodium and let us know if not improving.

## 2015-03-27 ENCOUNTER — Ambulatory Visit (INDEPENDENT_AMBULATORY_CARE_PROVIDER_SITE_OTHER): Payer: Commercial Managed Care - HMO | Admitting: Family Medicine

## 2015-03-27 VITALS — BP 109/68 | HR 72 | Temp 97.0°F | Ht 63.0 in | Wt 152.2 lb

## 2015-03-27 DIAGNOSIS — K52832 Lymphocytic colitis: Secondary | ICD-10-CM

## 2015-03-27 DIAGNOSIS — K5289 Other specified noninfective gastroenteritis and colitis: Secondary | ICD-10-CM

## 2015-03-27 DIAGNOSIS — R109 Unspecified abdominal pain: Secondary | ICD-10-CM | POA: Diagnosis not present

## 2015-03-27 MED ORDER — DIPHENOXYLATE-ATROPINE 2.5-0.025 MG PO TABS: 2.0000 | ORAL_TABLET | Freq: Four times a day (QID) | ORAL | Status: DC | PRN

## 2015-03-27 MED ORDER — PREDNISONE 20 MG PO TABS: ORAL_TABLET | ORAL | Status: DC

## 2015-03-27 NOTE — Progress Notes (Signed)
Subjective:  Patient ID: Kimberly Suarez, female    DOB: July 18, 1941  Age: 74 y.o. MRN: [id number]  CC: Diarrhea; Chills; and Fever   HPI Kimberly Suarez presents for 5-6 LBM a day for 3 days. Moderate diffuse abd. Discomfort. Food goes straight through so eating liquids and some bland foods only. This has decreased the discomfort and frequency of BM but not the overall progression. It seems to be getting worse. No recent use of antibiotics. No blood in BM. No melena.History of lymphocytic colitis with identical sx. Pt. Sure this is a recurrence and states prednisone has worked well before. This is confirmed independently by chart review.  History Kimberly Suarez has a past medical history of GERD (gastroesophageal reflux disease); Hiatal hernia; Diverticulosis; Hyperlipemia; Lymphocytic colitis (2007); Uterine prolapse; ASCUS (atypical squamous cells of undetermined significance) on Pap smear (2011x2); Hypertension; and Cataract.   She has past surgical history that includes Cataract extraction w/ intraocular lens implant (Bilateral).   Her family history includes Alzheimer's disease in her brother and sister; Asthma in her father; COPD in her father; Diabetes in her brother and brother; Early death in her brother; Heart disease in her mother and sister; Multiple sclerosis in her sister. There is no history of Colon cancer, Kidney disease, or Liver disease.She reports that she has never smoked. She has never used smokeless tobacco. She reports that she drinks alcohol. She reports that she does not use illicit drugs.  Outpatient Prescriptions Prior to Visit  Medication Sig Dispense Refill  . aspirin 81 MG tablet Take 81 mg by mouth daily.      Marland Kitchen atorvastatin (LIPITOR) 80 MG tablet TAKE 1 TABLET EVERY DAY 90 tablet 0  . Calcium Carbonate-Simethicone (TUMS PLUS PO) Take 1 tablet by mouth as needed.    . enalapril (VASOTEC) 10 MG tablet TAKE 1 TABLET TWICE DAILY 180 tablet 0  . estradiol (ESTRACE) 0.1  MG/GM vaginal cream Place 1 Applicatorful vaginally at bedtime.    . Glucosamine-Chondroitin (GLUCOSAMINE CHONDR COMPLEX PO) Take 1 tablet by mouth 2 (two) times daily.     . hydrochlorothiazide (HYDRODIURIL) 25 MG tablet Take 1 tablet (25 mg total) by mouth daily. 90 tablet 3  . Omega-3 Fatty Acids (FISH OIL) 1000 MG CAPS Take 1 capsule by mouth 2 (two) times daily.     . Probiotic Product (PROBIOTIC DAILY PO) Take by mouth. Takes INSYNC probiotics 1 capsules daily    . Multiple Vitamins-Minerals (HAIR/SKIN/NAILS) TABS Take 1 tablet by mouth daily.     No facility-administered medications prior to visit.    ROS Review of Systems  Constitutional: Positive for fever (subjective, low grade) and appetite change. Negative for chills and diaphoresis.  HENT: Negative for congestion, rhinorrhea and sore throat.   Eyes: Negative for visual disturbance.  Respiratory: Negative for cough and shortness of breath.   Cardiovascular: Negative for chest pain and palpitations.  Gastrointestinal: Positive for abdominal pain, diarrhea and abdominal distention. Negative for nausea, vomiting, constipation, blood in stool and rectal pain.  Genitourinary: Negative for dysuria, urgency, frequency, flank pain and decreased urine volume.  Musculoskeletal: Negative for joint swelling and arthralgias.  Skin: Negative for rash.  Neurological: Negative for dizziness and numbness.    Objective:  BP 109/68 mmHg  Pulse 72  Temp(Src) 97 F (36.1 C) (Oral)  Ht 5' 3"  (1.6 m)  Wt 152 lb 3.2 oz (69.037 kg)  BMI 26.97 kg/m2  BP Readings from Last 3 Encounters:  03/27/15 109/68  11/23/14 112/69  10/23/14 111/69    Wt Readings from Last 3 Encounters:  03/27/15 152 lb 3.2 oz (69.037 kg)  11/23/14 156 lb (70.761 kg)  10/23/14 156 lb (70.761 kg)     Physical Exam  Constitutional: She is oriented to person, place, and time. She appears well-developed and well-nourished. No distress.  HENT:  Head: Normocephalic  and atraumatic.  Eyes: Conjunctivae are normal. Pupils are equal, round, and reactive to light.  Neck: Normal range of motion. Neck supple. No thyromegaly present.  Cardiovascular: Normal rate, regular rhythm and normal heart sounds.   No murmur heard. Pulmonary/Chest: Effort normal and breath sounds normal. No respiratory distress. She has no wheezes. She has no rales.  Abdominal: Soft. She exhibits distension. She exhibits no mass. Bowel sounds are increased. There is no hepatosplenomegaly. There is generalized tenderness (diffuse, mild). There is no rebound, no guarding, no tenderness at McBurney's point and negative Murphy's sign.  Musculoskeletal: Normal range of motion.  Lymphadenopathy:    She has no cervical adenopathy.  Neurological: She is alert and oriented to person, place, and time.  Skin: Skin is warm and dry.  Psychiatric: She has a normal mood and affect. Her behavior is normal. Judgment and thought content normal.    Lab Results  Component Value Date   HGBA1C 6.2% 05/20/2014    Lab Results  Component Value Date   WBC 6.5 03/27/2015   HGB 13.5 11/16/2014   HCT 40.8 03/27/2015   GLUCOSE 92 03/27/2015   CHOL 175 11/16/2014   TRIG 218* 11/16/2014   HDL 46 11/16/2014   LDLCALC 69 05/18/2014   ALT 16 03/27/2015   AST 22 03/27/2015   NA 139 03/27/2015   K 3.6 03/27/2015   CL 94* 03/27/2015   CREATININE 1.35* 03/27/2015   BUN 16 03/27/2015   CO2 25 03/27/2015   HGBA1C 6.2% 05/20/2014    No results found.  Assessment & Plan:   Kimberly Suarez was seen today for diarrhea, chills and fever.  Diagnoses and all orders for this visit:  Lymphocytic colitis  Abdominal pain, unspecified abdominal location Orders: -     CBC with Differential/Platelet -     CMP14+EGFR  Other orders -     predniSONE (DELTASONE) 20 MG tablet; 1 twice daily for 2 weeks then one daily for 2 weeks -     diphenoxylate-atropine (LOMOTIL) 2.5-0.025 MG per tablet; Take 2 tablets by mouth 4 (four)  times daily as needed for diarrhea or loose stools. -     CBC with Differential/Platelet   I have discontinued [name]'s HAIR/SKIN/NAILS. I am also having her start on predniSONE and diphenoxylate-atropine. Additionally, I am having her maintain her Fish Oil, Glucosamine-Chondroitin (GLUCOSAMINE CHONDR COMPLEX PO), aspirin, estradiol, Calcium Carbonate-Simethicone (TUMS PLUS PO), Probiotic Product (PROBIOTIC DAILY PO), hydrochlorothiazide, enalapril, and atorvastatin.  Meds ordered this encounter  Medications  . predniSONE (DELTASONE) 20 MG tablet    Sig: 1 twice daily for 2 weeks then one daily for 2 weeks    Dispense:  42 tablet    Refill:  0  . diphenoxylate-atropine (LOMOTIL) 2.5-0.025 MG per tablet    Sig: Take 2 tablets by mouth 4 (four) times daily as needed for diarrhea or loose stools.    Dispense:  30 tablet    Refill:  1     Follow-up: Return if symptoms worsen or fail to improve.  Claretta Fraise, M.D.

## 2015-03-28 LAB — CMP14+EGFR
ALT: 16 [IU]/L (ref 0–32)
AST: 22 [IU]/L (ref 0–40)
Albumin/Globulin Ratio: 1.5 (ref 1.1–2.5)
Albumin: 4.3 g/dL (ref 3.5–4.8)
Alkaline Phosphatase: 48 [IU]/L (ref 39–117)
BUN/Creatinine Ratio: 12 (ref 11–26)
BUN: 16 mg/dL (ref 8–27)
Bilirubin Total: 0.5 mg/dL (ref 0.0–1.2)
CO2: 25 mmol/L (ref 18–29)
Calcium: 9.6 mg/dL (ref 8.7–10.3)
Chloride: 94 mmol/L — ABNORMAL LOW (ref 97–108)
Creatinine, Ser: 1.35 mg/dL — ABNORMAL HIGH (ref 0.57–1.00)
GFR calc Af Amer: 45 mL/min/{1.73_m2} — ABNORMAL LOW
GFR calc non Af Amer: 39 mL/min/{1.73_m2} — ABNORMAL LOW
Globulin, Total: 2.9 g/dL (ref 1.5–4.5)
Glucose: 92 mg/dL (ref 65–99)
Potassium: 3.6 mmol/L (ref 3.5–5.2)
Sodium: 139 mmol/L (ref 134–144)
Total Protein: 7.2 g/dL (ref 6.0–8.5)

## 2015-03-28 LAB — CBC WITH DIFFERENTIAL/PLATELET
Basophils Absolute: 0 10*3/uL (ref 0.0–0.2)
Basos: 1 %
EOS (ABSOLUTE): 0.2 10*3/uL (ref 0.0–0.4)
Eos: 3 %
Hematocrit: 40.8 % (ref 34.0–46.6)
Hemoglobin: 13.5 g/dL (ref 11.1–15.9)
Immature Grans (Abs): 0 10*3/uL (ref 0.0–0.1)
Immature Granulocytes: 0 %
Lymphocytes Absolute: 2.3 10*3/uL (ref 0.7–3.1)
Lymphs: 35 %
MCH: 30.3 pg (ref 26.6–33.0)
MCHC: 33.1 g/dL (ref 31.5–35.7)
MCV: 92 fL (ref 79–97)
Monocytes Absolute: 0.9 10*3/uL (ref 0.1–0.9)
Monocytes: 14 %
Neutrophils Absolute: 3.1 10*3/uL (ref 1.4–7.0)
Neutrophils: 47 %
Platelets: 222 10*3/uL (ref 150–379)
RBC: 4.45 x10E6/uL (ref 3.77–5.28)
RDW: 13.7 % (ref 12.3–15.4)
WBC: 6.5 10*3/uL (ref 3.4–10.8)

## 2015-03-29 NOTE — Progress Notes (Signed)
Patient aware.

## 2015-03-29 NOTE — Progress Notes (Signed)
Done 09/2013; Yearly screening mammogram suggested

## 2015-04-29 ENCOUNTER — Telehealth: Payer: Self-pay | Admitting: Family Medicine

## 2015-05-17 ENCOUNTER — Other Ambulatory Visit (INDEPENDENT_AMBULATORY_CARE_PROVIDER_SITE_OTHER): Payer: Commercial Managed Care - HMO

## 2015-05-17 DIAGNOSIS — I10 Essential (primary) hypertension: Secondary | ICD-10-CM | POA: Diagnosis not present

## 2015-05-17 DIAGNOSIS — N289 Disorder of kidney and ureter, unspecified: Secondary | ICD-10-CM

## 2015-05-17 DIAGNOSIS — R7309 Other abnormal glucose: Secondary | ICD-10-CM | POA: Diagnosis not present

## 2015-05-17 DIAGNOSIS — R7303 Prediabetes: Secondary | ICD-10-CM

## 2015-05-17 DIAGNOSIS — E559 Vitamin D deficiency, unspecified: Secondary | ICD-10-CM

## 2015-05-17 NOTE — Addendum Note (Signed)
Addended by: Zannie Cove on: 05/17/2015 05:35 PM   Modules accepted: Orders

## 2015-05-19 ENCOUNTER — Ambulatory Visit: Payer: Commercial Managed Care - HMO | Admitting: Family Medicine

## 2015-05-24 ENCOUNTER — Ambulatory Visit (INDEPENDENT_AMBULATORY_CARE_PROVIDER_SITE_OTHER): Payer: Commercial Managed Care - HMO | Admitting: Family Medicine

## 2015-05-24 ENCOUNTER — Encounter: Payer: Self-pay | Admitting: Family Medicine

## 2015-05-24 VITALS — BP 119/74 | HR 68 | Temp 98.1°F | Ht 63.0 in | Wt 151.0 lb

## 2015-05-24 DIAGNOSIS — H6123 Impacted cerumen, bilateral: Secondary | ICD-10-CM

## 2015-05-24 DIAGNOSIS — K222 Esophageal obstruction: Secondary | ICD-10-CM | POA: Diagnosis not present

## 2015-05-24 DIAGNOSIS — K219 Gastro-esophageal reflux disease without esophagitis: Secondary | ICD-10-CM | POA: Diagnosis not present

## 2015-05-24 DIAGNOSIS — N289 Disorder of kidney and ureter, unspecified: Secondary | ICD-10-CM

## 2015-05-24 DIAGNOSIS — R109 Unspecified abdominal pain: Secondary | ICD-10-CM

## 2015-05-24 DIAGNOSIS — R7303 Prediabetes: Secondary | ICD-10-CM

## 2015-05-24 DIAGNOSIS — I10 Essential (primary) hypertension: Secondary | ICD-10-CM

## 2015-05-24 DIAGNOSIS — R7309 Other abnormal glucose: Secondary | ICD-10-CM | POA: Diagnosis not present

## 2015-05-24 DIAGNOSIS — K5289 Other specified noninfective gastroenteritis and colitis: Secondary | ICD-10-CM | POA: Diagnosis not present

## 2015-05-24 DIAGNOSIS — K52832 Lymphocytic colitis: Secondary | ICD-10-CM

## 2015-05-24 MED ORDER — ENALAPRIL MALEATE 10 MG PO TABS: 10.0000 mg | ORAL_TABLET | Freq: Two times a day (BID) | ORAL | Status: DC

## 2015-05-24 MED ORDER — ATORVASTATIN CALCIUM 80 MG PO TABS: 80.0000 mg | ORAL_TABLET | Freq: Every day | ORAL | Status: DC

## 2015-05-24 MED ORDER — DIPHENOXYLATE-ATROPINE 2.5-0.025 MG PO TABS: 2.0000 | ORAL_TABLET | Freq: Four times a day (QID) | ORAL | Status: DC | PRN

## 2015-05-24 NOTE — Progress Notes (Signed)
Subjective:    Patient ID: Kimberly Suarez, female    DOB: 01/01/1941, 74 y.o.   MRN: [id number]  HPI Pt here for follow up and management of chronic medical problems which includes hypertension and hyperlipidemia. She is taking medications regularly. She does complain of loose bowel movements today. She is due to get lab work today. She has a history of colitis and was recently treated with Lomotil and prednisone and has seen the gastroenterologist in the past. She is having only a couple of loose bowel movements today and is requesting a refill only on the Lomotil. She has a history of having had a colonoscopy in 2012. She prefers not to go back to see the gastroenterologist at this time. Otherwise she denies chest pain or shortness of breath. She does have occasional indigestion and attributes this to her hiatal hernia. She has not seen any blood in the stool nor has she had any black tarry bowel movements. She is passing her water without problems. She has an eye exam planned for the future. She does have some ears cerumen and in the past did not come back to get her ears washed out and would like to have that done today.      Patient Active Problem List   Diagnosis Date Noted  . Pre-diabetes 06/01/2014  . Renal insufficiency 11/18/2013  . Osteoarthritis of left knee 11/18/2013  . HTN (hypertension) 06/26/2013  . Lymphocytic colitis 10/30/2012  . Hyperlipidemia 10/30/2012  . Unspecified constipation 07/10/2011  . Dysphagia, unspecified(787.20) 07/10/2011  . Diverticulosis of colon (without mention of hemorrhage) 07/10/2011  . Special screening for malignant neoplasms, colon 07/10/2011  . GERD with stricture 07/10/2011   Outpatient Encounter Prescriptions as of 05/24/2015  Medication Sig  . aspirin 81 MG tablet Take 81 mg by mouth daily.    Marland Kitchen atorvastatin (LIPITOR) 80 MG tablet TAKE 1 TABLET EVERY DAY  . Calcium Carbonate-Simethicone (TUMS PLUS PO) Take 1 tablet by mouth as needed.    . diphenoxylate-atropine (LOMOTIL) 2.5-0.025 MG per tablet Take 2 tablets by mouth 4 (four) times daily as needed for diarrhea or loose stools.  . enalapril (VASOTEC) 10 MG tablet TAKE 1 TABLET TWICE DAILY  . estradiol (ESTRACE) 0.1 MG/GM vaginal cream Place 1 Applicatorful vaginally at bedtime.  . Glucosamine-Chondroitin (GLUCOSAMINE CHONDR COMPLEX PO) Take 1 tablet by mouth 2 (two) times daily.   . hydrochlorothiazide (HYDRODIURIL) 25 MG tablet Take 1 tablet (25 mg total) by mouth daily.  . Omega-3 Fatty Acids (FISH OIL) 1000 MG CAPS Take 1 capsule by mouth 2 (two) times daily.   . Probiotic Product (PROBIOTIC DAILY PO) Take by mouth. Takes INSYNC probiotics 1 capsules daily  . [DISCONTINUED] predniSONE (DELTASONE) 20 MG tablet 1 twice daily for 2 weeks then one daily for 2 weeks   No facility-administered encounter medications on file as of 05/24/2015.     Review of Systems  Constitutional: Negative.   HENT: Negative.   Eyes: Negative.   Respiratory: Negative.   Cardiovascular: Negative.   Gastrointestinal: Positive for diarrhea.  Endocrine: Negative.   Genitourinary: Negative.   Musculoskeletal: Negative.   Skin: Negative.   Allergic/Immunologic: Negative.   Neurological: Negative.   Hematological: Negative.   Psychiatric/Behavioral: Negative.        Objective:   Physical Exam  Constitutional: She is oriented to person, place, and time. She appears well-developed and well-nourished. No distress.  HENT:  Head: Normocephalic and atraumatic.  Mouth/Throat: Oropharynx is clear and moist.  Nasal  congestion bilaterally Bilateral ears cerumen  Eyes: Conjunctivae and EOM are normal. Pupils are equal, round, and reactive to light. Right eye exhibits no discharge. Left eye exhibits no discharge. No scleral icterus.  Neck: Normal range of motion. Neck supple. No thyromegaly present.  Cardiovascular: Normal rate, regular rhythm, normal heart sounds and intact distal pulses.   No  murmur heard. At 72/m  Pulmonary/Chest: Effort normal and breath sounds normal. No respiratory distress. She has no wheezes. She has no rales. She exhibits no tenderness.  Clear anteriorly and posteriorly  Abdominal: Soft. Bowel sounds are normal. She exhibits no mass. There is tenderness. There is no rebound and no guarding.  There is epigastric tenderness without organ enlargement or masses. There is no inguinal adenopathy.  Musculoskeletal: Normal range of motion. She exhibits no edema or tenderness.  Lymphadenopathy:    She has no cervical adenopathy.  Neurological: She is alert and oriented to person, place, and time. She has normal reflexes. No cranial nerve deficit.  Skin: Skin is warm and dry. No rash noted.  Psychiatric: She has a normal mood and affect. Her behavior is normal. Judgment and thought content normal.  Nursing note and vitals reviewed.  BP 119/74 mmHg  Pulse 68  Temp(Src) 98.1 F (36.7 C) (Oral)  Ht 5\' 3"  (1.6 m)  Wt 151 lb (68.493 kg)  BMI 26.76 kg/m2        Assessment & Plan:  1. Essential hypertension -The blood pressures under good control today and the patient will continue with current treatment  2. GERD with stricture -She will continue with watching her diet with caffeine intake.  3. Pre-diabetes -Continue to watch blood sugars closely and exercise regularly and follow.  4. Renal insufficiency -We will continue to monitor this pending results of lab work  5. Abdominal pain, unspecified abdominal location -The patient will continue to take Lomotil for her lymphocytic colitis and may need another round of prednisone if this does not resolve the loose bowel movements. We may also need to discuss this further with her gastroenterologist. - diphenoxylate-atropine (LOMOTIL) 2.5-0.025 MG per tablet; Take 2 tablets by mouth 4 (four) times daily as needed for diarrhea or loose stools.  Dispense: 30 tablet; Refill: 1  6. Excessive cerumen in ear canal,  bilateral -Ear irrigation today  7. Lymphocytic colitis -Take Lomotil, drink plenty of fluids and avoid caffeine and milk cheese ice cream and dairy products  Meds ordered this encounter  Medications  . atorvastatin (LIPITOR) 80 MG tablet    Sig: Take 1 tablet (80 mg total) by mouth daily.    Dispense:  90 tablet    Refill:  3  . enalapril (VASOTEC) 10 MG tablet    Sig: Take 1 tablet (10 mg total) by mouth 2 (two) times daily.    Dispense:  180 tablet    Refill:  3  . diphenoxylate-atropine (LOMOTIL) 2.5-0.025 MG per tablet    Sig: Take 2 tablets by mouth 4 (four) times daily as needed for diarrhea or loose stools.    Dispense:  30 tablet    Refill:  1   Patient Instructions                       Medicare Annual Wellness Visit  [facility] and the medical providers at [facility] strive to bring you the best medical care.  In doing so we not only want to address your current medical conditions and concerns but also  to detect new conditions early and prevent illness, disease and health-related problems.    Medicare offers a yearly Wellness Visit which allows our clinical staff to assess your need for preventative services including immunizations, lifestyle education, counseling to decrease risk of preventable diseases and screening for fall risk and other medical concerns.    This visit is provided free of charge (no copay) for all Medicare recipients. The clinical pharmacists at [facility] have begun to conduct these Wellness Visits which will also include a thorough review of all your medications.    As you primary medical provider recommend that you make an appointment for your Annual Wellness Visit if you have not done so already this year.  You may set up this appointment before you leave today or you may call back ([phone]) and schedule an appointment.  Please make sure when you call that you mention that you are scheduling your  Annual Wellness Visit with the clinical pharmacist so that the appointment may be made for the proper length of time.     Continue current medications. Continue good therapeutic lifestyle changes which include good diet and exercise. Fall precautions discussed with patient. If an FOBT was given today- please return it to our front desk. If you are over 17 years old - you may need Prevnar 10 or the adult Pneumonia vaccine.  **Flu shots will be available soon--- please call and schedule a FLU-CLINIC appointment**  After your visit with [place] today you will receive a survey in the mail or online from [employer] regarding your care with [place]. Please take a moment to fill this out. Your feedback is very important to [place] as you can help [place] better understand your patient needs as well as improve your experience and satisfaction. WE CARE ABOUT YOU!!!   **Please join [place] SEPT.22, 2016 from 5:00 to 7:00pm for our OPEN HOUSE! Come out and meet our NEW providers**  If problems with loose bowel movements continue the patient should call us back and we will discuss this with the gastroenterologist if necessary She should avoid milk cheese ice cream and dairy products and also should avoid caffeine intake She should continue with current treatment.   Arrie Senate MD

## 2015-05-24 NOTE — Addendum Note (Signed)
Addended by: Wyline Mood on: 05/24/2015 09:43 AM   Modules accepted: Orders

## 2015-05-24 NOTE — Addendum Note (Signed)
Addended by: Wyline Mood on: 05/24/2015 09:42 AM   Modules accepted: Orders

## 2015-05-24 NOTE — Progress Notes (Signed)
Lab only 

## 2015-05-24 NOTE — Progress Notes (Signed)
Lab only 

## 2015-05-24 NOTE — Patient Instructions (Addendum)
Medicare Annual Wellness Visit  [facility] and the medical providers at [facility] strive to bring you the best medical care.  In doing so we not only want to address your current medical conditions and concerns but also to detect new conditions early and prevent illness, disease and health-related problems.    Medicare offers a yearly Wellness Visit which allows our clinical staff to assess your need for preventative services including immunizations, lifestyle education, counseling to decrease risk of preventable diseases and screening for fall risk and other medical concerns.    This visit is provided free of charge (no copay) for all Medicare recipients. The clinical pharmacists at [facility] have begun to conduct these Wellness Visits which will also include a thorough review of all your medications.    As you primary medical provider recommend that you make an appointment for your Annual Wellness Visit if you have not done so already this year.  You may set up this appointment before you leave today or you may call back ([phone]) and schedule an appointment.  Please make sure when you call that you mention that you are scheduling your Annual Wellness Visit with the clinical pharmacist so that the appointment may be made for the proper length of time.     Continue current medications. Continue good therapeutic lifestyle changes which include good diet and exercise. Fall precautions discussed with patient. If an FOBT was given today- please return it to our front desk. If you are over 105 years old - you may need Prevnar 41 or the adult Pneumonia vaccine.  **Flu shots will be available soon--- please call and schedule a FLU-CLINIC appointment**  After your visit with [place] today you will receive a survey in the mail or online from [employer] regarding your care with [place]. Please take a moment to fill this out. Your feedback is  very important to [place] as you can help [place] better understand your patient needs as well as improve your experience and satisfaction. WE CARE ABOUT YOU!!!   **Please join [place] SEPT.22, 2016 from 5:00 to 7:00pm for our OPEN HOUSE! Come out and meet our NEW providers**  If problems with loose bowel movements continue the patient should call us back and we will discuss this with the gastroenterologist if necessary She should avoid milk cheese ice cream and dairy products and also should avoid caffeine intake She should continue with current treatment.

## 2015-05-25 LAB — BMP8+EGFR
BUN/Creatinine Ratio: 11 (ref 11–26)
BUN: 14 mg/dL (ref 8–27)
CO2: 25 mmol/L (ref 18–29)
Calcium: 9.5 mg/dL (ref 8.7–10.3)
Chloride: 102 mmol/L (ref 97–108)
Creatinine, Ser: 1.27 mg/dL — ABNORMAL HIGH (ref 0.57–1.00)
GFR calc Af Amer: 48 mL/min/{1.73_m2} — ABNORMAL LOW
GFR calc non Af Amer: 42 mL/min/{1.73_m2} — ABNORMAL LOW
Glucose: 120 mg/dL — ABNORMAL HIGH (ref 65–99)
Potassium: 4 mmol/L (ref 3.5–5.2)
Sodium: 143 mmol/L (ref 134–144)

## 2015-05-25 LAB — VITAMIN D 25 HYDROXY (VIT D DEFICIENCY, FRACTURES): Vit D, 25-Hydroxy: 39.2 ng/mL (ref 30.0–100.0)

## 2015-05-25 LAB — HEPATIC FUNCTION PANEL
ALT: 21 [IU]/L (ref 0–32)
AST: 26 [IU]/L (ref 0–40)
Albumin: 4 g/dL (ref 3.5–4.8)
Alkaline Phosphatase: 43 [IU]/L (ref 39–117)
Bilirubin Total: 0.3 mg/dL (ref 0.0–1.2)
Bilirubin, Direct: 0.08 mg/dL (ref 0.00–0.40)
Total Protein: 6.2 g/dL (ref 6.0–8.5)

## 2015-05-25 LAB — LIPID PANEL
Chol/HDL Ratio: 4 ratio (ref 0.0–4.4)
Cholesterol, Total: 158 mg/dL (ref 100–199)
HDL: 40 mg/dL
LDL Calculated: 83 mg/dL (ref 0–99)
Triglycerides: 176 mg/dL — ABNORMAL HIGH (ref 0–149)
VLDL Cholesterol Cal: 35 mg/dL (ref 5–40)

## 2015-05-25 LAB — POCT GLYCOSYLATED HEMOGLOBIN (HGB A1C): Hemoglobin A1C: 6.4

## 2015-05-25 NOTE — Addendum Note (Signed)
Addended by: Wyline Mood on: 05/25/2015 08:26 AM   Modules accepted: Orders

## 2015-05-26 ENCOUNTER — Ambulatory Visit: Payer: Commercial Managed Care - HMO | Admitting: Family Medicine

## 2015-05-26 ENCOUNTER — Telehealth: Payer: Self-pay | Admitting: *Deleted

## 2015-05-26 NOTE — Telephone Encounter (Signed)
Please advise if script for Metformin 500 mg should be for once daily or twice daily?

## 2015-05-27 NOTE — Telephone Encounter (Signed)
1 daily.

## 2015-05-28 ENCOUNTER — Other Ambulatory Visit: Payer: Self-pay | Admitting: *Deleted

## 2015-05-28 MED ORDER — METFORMIN HCL 500 MG PO TABS: 500.0000 mg | ORAL_TABLET | Freq: Every day | ORAL | Status: DC

## 2015-05-28 NOTE — Telephone Encounter (Signed)
Aware to begin metformn and keep appointment with pharm D .

## 2015-05-31 ENCOUNTER — Telehealth: Payer: Self-pay | Admitting: Family Medicine

## 2015-05-31 NOTE — Telephone Encounter (Signed)
If this is causing nausea and vomiting, she should discontinue it and wait a few days until the nausea and vomiting is resolved and try one half pills once daily with food. If the half pill causes nausea and vomiting she should discontinue that and not take any more.

## 2015-05-31 NOTE — Telephone Encounter (Signed)
Please advise 

## 2015-06-01 NOTE — Telephone Encounter (Signed)
Patient verbalized understanding.  She also stated that she would like to cancel her appt for tomorrow 10/5 with Dr. Laurance Flatten,  She is feeling some better.  Advised patient if she started to feel bad again to give [place] a call

## 2015-06-02 ENCOUNTER — Encounter: Payer: Commercial Managed Care - HMO | Admitting: Family Medicine

## 2015-06-03 LAB — HM DIABETES EYE EXAM

## 2015-06-04 ENCOUNTER — Ambulatory Visit (INDEPENDENT_AMBULATORY_CARE_PROVIDER_SITE_OTHER): Payer: Commercial Managed Care - HMO | Admitting: Family Medicine

## 2015-06-04 ENCOUNTER — Encounter: Payer: Self-pay | Admitting: Family Medicine

## 2015-06-04 ENCOUNTER — Telehealth: Payer: Self-pay | Admitting: Family Medicine

## 2015-06-04 ENCOUNTER — Telehealth: Payer: Self-pay | Admitting: Gastroenterology

## 2015-06-04 VITALS — BP 124/75 | HR 76 | Temp 98.0°F | Ht 63.0 in | Wt 152.4 lb

## 2015-06-04 DIAGNOSIS — K52832 Lymphocytic colitis: Secondary | ICD-10-CM

## 2015-06-04 DIAGNOSIS — R197 Diarrhea, unspecified: Secondary | ICD-10-CM

## 2015-06-04 MED ORDER — PREDNISONE 20 MG PO TABS: ORAL_TABLET | ORAL | Status: DC

## 2015-06-04 NOTE — Telephone Encounter (Signed)
Continuing to have diarrhea. Hx of colitis. Referral to Dr Hilarie Fredrickson with [facility] placed. She was a prior patient of Dr Sharlett Iles.

## 2015-06-04 NOTE — Telephone Encounter (Signed)
Left a message for patient to call back. 

## 2015-06-04 NOTE — Telephone Encounter (Signed)
Spoke with patient and offered next OV available with APP. She states she will see her PCP and call back if she needs OV here.

## 2015-06-04 NOTE — Patient Instructions (Signed)
Great to meet you!  If you develop worsening abdominal pain or fevers please seek medical attention right away.

## 2015-06-04 NOTE — Progress Notes (Addendum)
   HPI  Patient presents today for diarrhea  Patient explains that she has a history of lymphocytic colitis. She explains several months history of intermittent diarrhea that's worse than her baseline diarrhea. She had some improvement previously with prednisone.  She describes 2-3 loose stools daily over the last 3 weeks, over the last week or so her appetite has decreased and she's also developed off-and-on chills. She denies any abdominal pain, hematochezia, or melanoma. She states that the diarrhea is foul smelling.  She contacted the GI doctors today and was offered an appointment for 11 days from now.  PMH: Smoking status noted ROS: Per HPI  Objective: BP 124/75 mmHg  Pulse 76  Temp(Src) 98 F (36.7 C) (Oral)  Ht 5\' 3"  (1.6 m)  Wt 152 lb 6.4 oz (69.128 kg)  BMI 27.00 kg/m2 Gen: NAD, alert, cooperative with exam HEENT: NCAT CV: RRR, good S1/S2, no murmur Resp: CTABL, no wheezes, non-labored Abd: Positive bowel sounds, soft, slight tenderness to palpation throughout, no guarding Ext: No edema, warm Neuro: Alert and oriented, No gross deficits  Assessment and plan:  # Chronic diarrhea, lymphocytic colitis Treatment prednisone course There are no clear alternatives to treatment at this time, she states that loperamide has not helped her much. Would consider viberzi, however I believe cost would be an issue Abd exam reassuring, unlikely diverticulitis CBC, r/o C diff, no recent antibiotics   Orders Placed This Encounter  Procedures  . Clostridium difficile EIA  . Clostridium Difficile by PCR    Order Specific Question:  Is your patient experiencing loose or watery stools (3 or more in 24 hours)?    Answer:  Yes    Order Specific Question:  Has the patient received laxatives in the last 24 hours?    Answer:  No    Order Specific Question:  Has a negative Cdiff test resulted in the last 7 days?    Answer:  No  . CBC with Differential    Meds ordered this  encounter  Medications  . predniSONE (DELTASONE) 20 MG tablet    Sig: Take 2 tabs daily for 4 days, then take 1 tab daily for 4 days, then take one half tab daily 4 days then stop.    Dispense:  14 tablet    Refill:  0    Laroy Apple, MD [facility] 06/04/2015, 6:47 PM

## 2015-06-06 LAB — CBC WITH DIFFERENTIAL/PLATELET
Basophils Absolute: 0 10*3/uL (ref 0.0–0.2)
Basos: 1 %
EOS (ABSOLUTE): 0.2 10*3/uL (ref 0.0–0.4)
Eos: 3 %
Hematocrit: 37.7 % (ref 34.0–46.6)
Hemoglobin: 12.7 g/dL (ref 11.1–15.9)
Immature Grans (Abs): 0 10*3/uL (ref 0.0–0.1)
Immature Granulocytes: 0 %
Lymphocytes Absolute: 2.8 10*3/uL (ref 0.7–3.1)
Lymphs: 38 %
MCH: 29.9 pg (ref 26.6–33.0)
MCHC: 33.7 g/dL (ref 31.5–35.7)
MCV: 89 fL (ref 79–97)
Monocytes Absolute: 0.8 10*3/uL (ref 0.1–0.9)
Monocytes: 11 %
Neutrophils Absolute: 3.6 10*3/uL (ref 1.4–7.0)
Neutrophils: 47 %
Platelets: 259 10*3/uL (ref 150–379)
RBC: 4.25 x10E6/uL (ref 3.77–5.28)
RDW: 14 % (ref 12.3–15.4)
WBC: 7.5 10*3/uL (ref 3.4–10.8)

## 2015-06-07 ENCOUNTER — Other Ambulatory Visit: Payer: Commercial Managed Care - HMO

## 2015-06-07 NOTE — Progress Notes (Signed)
Lab only 

## 2015-06-08 LAB — CLOSTRIDIUM DIFFICILE BY PCR: Toxigenic C. Difficile by PCR: NEGATIVE

## 2015-06-09 ENCOUNTER — Ambulatory Visit (INDEPENDENT_AMBULATORY_CARE_PROVIDER_SITE_OTHER): Payer: Commercial Managed Care - HMO | Admitting: Pharmacist

## 2015-06-09 ENCOUNTER — Encounter: Payer: Self-pay | Admitting: *Deleted

## 2015-06-09 ENCOUNTER — Encounter: Payer: Self-pay | Admitting: Pharmacist

## 2015-06-09 VITALS — BP 120/72 | HR 70 | Ht 63.0 in | Wt 152.0 lb

## 2015-06-09 DIAGNOSIS — Z0000 Encounter for general adult medical examination without abnormal findings: Secondary | ICD-10-CM | POA: Diagnosis not present

## 2015-06-09 DIAGNOSIS — Z780 Asymptomatic menopausal state: Secondary | ICD-10-CM

## 2015-06-09 DIAGNOSIS — R7303 Prediabetes: Secondary | ICD-10-CM

## 2015-06-09 NOTE — Progress Notes (Signed)
Subjective:   Kimberly Suarez is a 74 y.o. married, white  female who presents for a subsequent Medicare Annual Wellness Visit and to discuss pre diabetes and medication.  Kimberly Suarez was diagnosed pre diabetes about 1 year ago.  At that time A1c was 6.2%.  Most recent A1c was 6.4% (05/25/2015).  She has taken 2 rounds of prednisone recently to treat lymphocytic colitis.  She was taking metformin 500mg  once daily but is currently holding due to diarrhea (which could be side effect of metformin or colitis)   Current Medications (verified) Outpatient Encounter Prescriptions as of 06/09/2015  Medication Sig  . aspirin 81 MG tablet Take 81 mg by mouth daily.    Marland Kitchen atorvastatin (LIPITOR) 80 MG tablet Take 1 tablet (80 mg total) by mouth daily.  . Calcium Carbonate-Simethicone (TUMS PLUS PO) Take 1 tablet by mouth as needed.  . diphenoxylate-atropine (LOMOTIL) 2.5-0.025 MG per tablet Take 2 tablets by mouth 4 (four) times daily as needed for diarrhea or loose stools.  . enalapril (VASOTEC) 10 MG tablet Take 1 tablet (10 mg total) by mouth 2 (two) times daily.  Marland Kitchen estradiol (ESTRACE) 0.1 MG/GM vaginal cream Place 1 Applicatorful vaginally at bedtime.  . Glucosamine-Chondroitin (GLUCOSAMINE CHONDR COMPLEX PO) Take 1 tablet by mouth 2 (two) times daily.   . hydrochlorothiazide (HYDRODIURIL) 25 MG tablet Take 1 tablet (25 mg total) by mouth daily.  . Omega-3 Fatty Acids (FISH OIL) 1000 MG CAPS Take 1 capsule by mouth 2 (two) times daily.   . predniSONE (DELTASONE) 20 MG tablet Take 2 tabs daily for 4 days, then take 1 tab daily for 4 days, then take one half tab daily 4 days then stop.  . Probiotic Product (PROBIOTIC DAILY PO) Take by mouth. Takes INSYNC probiotics 1 capsules daily  . metFORMIN (GLUCOPHAGE) 500 MG tablet Take 1 tablet (500 mg total) by mouth daily with breakfast. (Patient not taking: Reported on 06/09/2015)   No facility-administered encounter medications on file as of 06/09/2015.     Allergies (verified) Review of patient's allergies indicates no known allergies.   History: Past Medical History  Diagnosis Date  . GERD (gastroesophageal reflux disease)   . Hiatal hernia   . Diverticulosis   . Hyperlipemia   . Lymphocytic colitis 2007  . Uterine prolapse     Pessary  . ASCUS (atypical squamous cells of undetermined significance) on Pap smear 2011x2    Negative high-risk HPV, normal Pap smears 2012/2013  . Hypertension   . Cataract    Past Surgical History  Procedure Laterality Date  . Cataract extraction w/ intraocular lens implant Bilateral    Family History  Problem Relation Age of Onset  . Colon cancer Neg Hx   . Kidney disease Neg Hx   . Liver disease Neg Hx   . Heart disease Mother   . Diabetes Brother     BKA  . COPD Father   . Asthma Father   . Alzheimer's disease Sister   . Alzheimer's disease Brother   . Multiple sclerosis Sister   . Heart disease Sister   . Diabetes Brother   . Early death Brother     died in war   Social History   Occupational History  . Retired     Social History Main Topics  . Smoking status: Never Smoker   . Smokeless tobacco: Never Used  . Alcohol Use: 0.0 oz/week    0 Standard drinks or equivalent per week  Comment: Rare  . Drug Use: No  . Sexual Activity: Yes    Birth Control/ Protection: Post-menopausal     Comment: 1st intercourse 89 yo--1 partner    Do you feel safe at home?  Yes  Dietary issues and exercise activities: patient exercises 3 times per week for about 90 minutes - goes to gym.    Current Dietary habits:  She is trying to limit CHO intake but admits to recently not following CHO counting diet as closely  Objective:    Today's Vitals   06/09/15 1150  BP: 120/72  Pulse: 70  Height: 5\' 3"  (1.6 m)  Weight: 152 lb (68.947 kg)  PainSc: 0-No pain   Body mass index is 26.93 kg/(m^2).  Activities of Daily Living In your present state of health, do you have any difficulty  performing the following activities: 06/09/2015  Hearing? N  Vision? N  Difficulty concentrating or making decisions? N  Walking or climbing stairs? N  Dressing or bathing? N  Doing errands, shopping? N    Are there smokers in your home (other than you)? No   Depression Screen PHQ 2/9 Scores 06/09/2015 05/24/2015 11/23/2014 10/23/2014  PHQ - 2 Score 0 0 0 0    Fall Risk Fall Risk  06/09/2015 05/24/2015 11/23/2014 10/23/2014 06/01/2014  Falls in the past year? No No No No No    Cognitive Function: MMSE - Mini Mental State Exam 06/09/2015  Orientation to time 5  Orientation to Place 5  Registration 3  Attention/ Calculation 5  Recall 2  Language- name 2 objects 2  Language- repeat 1  Language- follow 3 step command 3  Language- read & follow direction 1  Write a sentence 1  Copy design 1  Total score 29    Immunizations and Health Maintenance Immunization History  Administered Date(s) Administered  . Influenza,inj,Quad PF,36+ Mos 07/02/2013, 05/26/2014  . Pneumococcal Conjugate-13 07/02/2013   There are no preventive care reminders to display for this patient.  Patient Care Team: Chipper Herb, MD as PCP - General (Family Medicine) Selinda Orion, MD (Obstetrics and Gynecology) Zenovia Jarred (since Dr Sharlett Iles retired), MD (Gastroenterology)  Indicate any recent Medical Services you may have received from other than [facility] providers in the past year (date may be approximate).  Dr Gershon Crane - opthalmologist    Assessment:    Annual Wellness Visit  Pre diabetes Medication managment   Screening Tests Health Maintenance  Topic Date Due  . INFLUENZA VACCINE  06/14/2015 (Originally 03/29/2015)  . PNA vac Low Risk Adult (2 of 2 - PPSV23) 11/21/2015 (Originally 07/02/2014)  . MAMMOGRAM  09/30/2015  . COLON CANCER SCREENING ANNUAL FOBT  12/19/2015  . DEXA SCAN  07/02/2016  . TETANUS/TDAP  05/28/2021  . COLONOSCOPY  07/09/2021  . ZOSTAVAX  Completed      Plan:    During the course of the visit Kimberly Suarez was educated and counseled about the following appropriate screening and preventive services:   Vaccines to include Pneumoccal, Influenza, Hepatitis B, Td, Zostavax - patient to get influenza vaccine in 2 weeks after finished with prednisone.  Other labs UTD  Colorectal cancer screening - colonoscopy and FOBT are both UTD  Cardiovascular disease screening - lipids UTD;  BP is at goal  Pre diabetes - reviewed CHO counting diet  Discussed retrying metformin after GI symptoms resolve - can try 1/2 tablet daily  Bone Denisty / Osteoporosis Screening - ordered today to recheck in November 2016  Mammogram - appt  made with [facility] today in office  PAP - UTD  Glaucoma screening /  Eye Exam -up to date.   Advanced Directives - UTD   Patient Instructions (the written plan) were given to the patient.   Cherre Robins, [place], CPP, CDE  06/09/2015

## 2015-06-09 NOTE — Patient Instructions (Addendum)
Kimberly Suarez , Thank you for taking time to come for your Medicare Wellness Visit. I appreciate your ongoing commitment to your health goals. Please review the following plan we discussed and let me know if I can assist you in the future.   These are the goals we discussed:  Increase non-starchy vegetables - carrots, green bean, squash, zucchini, tomatoes, onions, peppers, spinach and other green leafy vegetables, cabbage, lettuce, cucumbers, asparagus, okra (not fried), eggplant limit sugar and processed foods (cakes, cookies, ice cream, crackers and chips) Increase fresh fruit but limit serving sizes 1/2 cup or about the size of tennis or baseball limit red meat to no more than 1-2 times per week (serving size about the size of your palm) Choose whole grains / lean proteins - whole wheat bread, quinoa, whole grain rice (1/2 cup), fish, chicken, [place]  Retry metformin at 1/2 tablet daily with largest meal of day once other gastrointestinal symptoms improve.  Continue to exercise regularly!  - Great job!   This is a list of the screening recommended for you and due dates:  Health Maintenance  Topic Date Due  . Flu Shot  Will get after finished with prednisone (around 06/28/15)  . Pneumonia vaccines (2 of 2 - PPSV23) Completed  . Mammogram  09/30/2015  . Stool Blood Test  12/19/2015  . Tetanus Vaccine  05/28/2021  . Colon Cancer Screening  07/09/2021  . DEXA scan (bone density measurement)  Sent order to have this checked in November 2016  . Shingles Vaccine  Completed  *Topic was postponed. The date shown is not the original due date.    Prediabetes Many people have heard about type 2 diabetes, but its common precursor, prediabetes, doesn't get as much attention. Prediabetes is estimated by CDC to affect 86 million Americans (this includes 51% of people 65 years and older), and an estimated 90% of people with prediabetes don't even know it. According to the CDC, 15-30% of these  individuals will develop type 2 diabetes within five years. In other words, as many as 26 million people that currently have prediabetes could develop type 2 diabetes by 2020, effectively doubling the number of people with type 2 diabetes in the [place].  What is prediabetes? Prediabetes is a condition where blood sugar levels are higher than normal, but not high enough to be diagnosed as type 2 diabetes. This occurs when the body has problems in processing glucose properly, and sugar starts to build up in the bloodstream instead of fueling cells in muscles and tissues. Insulin is the hormone that tells cells to take up glucose, and in prediabetes, people typically initially develop insulin resistance (where the body's cells can't respond to insulin as well), and over time (if no actions are taken to reverse the situation) the ability to produce sufficient insulin is reduced. People with prediabetes also commonly have high blood pressure as well as abnormal blood lipids (e.g. cholesterol). These often occur prior to the rise of blood glucose levels.  What are the symptoms of prediabetes? People typically do not have symptoms of prediabetes, which is partially why up to 90% of people don't know they have it. The ADA reports that some people with prediabetes may develop symptoms of type 2 diabetes, though even many people diagnosed with type 2 diabetes show little or no symptoms initially at diagnosis.  How is prediabetes diagnosed? According to the American Diabetes Association, prediabetes can be diagnosed through one of the following tests: 1. A glycated hemoglobin test,  also known as HbA1c or simply A1c, gives an idea of the body's average blood sugar levels from the past two or three months. It is usually done with a small drop of blood from a fingerstick or as part of having blood taken in a doctor's office, hospital, or laboratory. A1c Level Diagnosis  Less than 5.7% Normal  5.7% to 6.4% Prediabetes  6.5%  and higher Diabetes   Your last A1c was 6.4% (04/2015) Previous A1c was 6.2% (05/2014)  2.  A fasting plasma glucose (FPG) test measures a person's blood glucose level after fasting (not eating) for eight hours - this is typically done in the morning. If a test shows positive for prediabetes, a second test should be taken on a different day to confirm the diagnosis. FPG Level Diagnosis  Less than 100 mg/dl Normal  100 mg/dl to 125 mg/dl Prediabetes  126 mg/dl and higher Diabetes   Who is at risk of developing prediabetes? A well-known paper published in the Lancet in 2010 recommends screening for type 2 diabetes (which would also screen for prediabetes) every 3-5 years in all adults over the age of 13, regardless of other risk factors. Overweight and obese adults (a BMI >25 kg/m2) are also at significantly greater risk for developing prediabetes, as well as people with a family history of type 2 diabetes. According to the CDC, several other factors can have moderate influences on prediabetes risk in addition to age, weight, and family history: People with an [place] American, Hispanic/Latino, American [place] American, or [place] racial or ethnic background. The 2015 ADA Standards of Medical Care recommendations suggest Asian Americans with a BMI of 23 or above be screened for type 2 diabetes.  Women with a history of diabetes during pregnancy ("gestational diabetes") or have given birth to a baby weighing nine pounds or more. People who are physically active fewer than three times a week. The CDC offers a fast, online screening test for evaluating the risk for prediabetes. The ADA also offers a screening test to assess type 2 diabetes risk. Of course, these tests do not themselves confirm a prediabetes diagnosis, but just if someone may be at higher risk of developing it.  Why do people develop prediabetes? Prediabetes develops through a combination of factors that are still being  investigated. For sure, lifestyle factors (food, exercise, stress, sleep) play a role, but family history and genetics certainly do as well. It is easy to assume that prediabetes is the result of being overweight, but the relationship is not that simple. While obesity is one underlying cause of insulin resistance, many overweight individuals may never develop prediabetes or type 2 diabetes, and a minority of people with prediabetes have never been overweight. To make matters worse, it can be increasingly difficult to make healthy choices in today's toxic food environment that steers all of [place] to make the wrong food choices, and there are many factors that can contribute to weight gain in addition to diet.  Is a prediabetes diagnosis serious? There has been significant debate around the term 'prediabetes,' and whether it should be considered cause for alarm. On the one hand, it serves as a risk factor for type 2 diabetes and a host of other complications, including heart disease, and ultimately prediabetes implies that a degree of metabolic problems have started to occur in the body. On the other hand, it places a diagnosis on many people who may never develop type 2 diabetes. Again, according to the CDC, 15-30%  of those with prediabetes will develop type 2 diabetes within five years. However, a 2012 Lancet article cites 5-10% of those with prediabetes each year will also revert back to healthy blood sugars. What's critical is not necessarily the cutoff itself, but where someone falls within the ranges listed above. The level of risk of developing type 2 diabetes is closely related to A1c or FPG at diagnosis. Those in the higher ranges (A1c closer to 6.4%, FPG closer to 125 mg/dl) are much more likely to progress to type 2 diabetes, whereas those at lower ranges (A1c closer to 5.7%, FPG closer to 100 mg/dl) are relatively more likely to revert back to normal glucose levels or stay within the prediabetes range. Age  of diagnosis and the level of insulin production still occurring at diagnosis also impact the chances of reverting to normoglycemia (normal blood sugar levels).  What can people with prediabetes do to avoid the progression from prediabetes to type 2 diabetes? The most important action people diagnosed with prediabetes can take is to focus on living a healthy lifestyle. This includes making healthy food choices, controlling portions, and increasing physical activity. Regarding weight control, research shows losing 5-7% (often about 10-20 lbs.) from your initial body weight and keeping off as much of that weight over time as possible is critical to lowering the risk of type 2 diabetes. This task is of course easier said than done, but sustained weight loss over time can be key to improving health and delaying or preventing the onset of type 2 diabetes. Several prediabetes interventions exist based on evidence from the landmark Diabetes Prevention Program (DPP) study. The DPP study reported that moderate weight loss (5-7% of body weight, or ~10-15 lbs. for someone weighing 200 lbs.), counseling, and education on healthy eating and behavior reduced the risk of developing type 2 diabetes by 58%. Data presented at the [place] 2014 conference showed that after 15 years of follow-up of the DPP study groups, the results were still encouraging: 27% of those in the original lifestyle group had a significant reduction in type 2 diabetes progression compared to the control group. If you or someone you know has been told they have prediabetes, here are a few helpful resources: In-person diabetes prevention programs: The CDC offers a one year long lifestyle change program through its National Diabetes Prevention Program (NDPP) at various locations throughout the [place] to help participants adopt healthy habits and prevent or delay progression to type 2 diabetes. This program is a major undertaking by the CDC to translate the findings  from the DPP study into a real world setting, a significant effort indeed! Online diabetes prevention programs: The CDC has now given pending recognition status to three digital prevention programs: [place], and [facility]. These offer the same one year long educational curriculum as the DPP study, but in an online format. Some insurance companies and employers cover these programs, and you can find more information at the links above. These digital versions are excellent options for those who live far away from NDPP locations or who prefer the anonymity and convenience of doing the program online. Metformin: The DPP study found that metformin, the safest first-line therapy for type 2 diabetes, may help delay the onset of type 2 diabetes in people with prediabetes. Participants who took the low-cost generic drug had a 31% reduced risk of developing type 2 diabetes compared to the control group (those not on metformin or intensive lifestyle intervention). Again, 15-year follow up  data showed that 17% of those on metformin continued to have a significant reduction in type 2 progression. At this time, metformin (or any other medication, for that matter) is not currently FDA approved for prediabetes, and it is sometimes prescribed "off-label" by a healthcare provider. Your healthcare provider can give you more information and determine whether metformin is a good option for you.  Can prediabetes be "cured"? In the early stages of prediabetes (and type 2 diabetes), diligent attention to food choices and activity, and most importantly weight loss, can improve blood sugar numbers, effectively "reversing" the disease and reducing the odds of developing type 2 diabetes. However, some people may have underlying factors (such as family history and genetics) that put them at a greater risk of type 2 diabetes, meaning they will always require careful attention to blood sugar levels and lifestyle choices.  Returning to old habits will likely put someone back on the road to prediabetes, and eventually, type 2 diabetes

## 2015-06-14 ENCOUNTER — Telehealth: Payer: Self-pay | Admitting: Family Medicine

## 2015-06-14 NOTE — Telephone Encounter (Signed)
Patient aware that results were negative.

## 2015-06-28 ENCOUNTER — Ambulatory Visit (INDEPENDENT_AMBULATORY_CARE_PROVIDER_SITE_OTHER): Payer: Commercial Managed Care - HMO

## 2015-06-28 ENCOUNTER — Encounter: Payer: Self-pay | Admitting: Family Medicine

## 2015-06-28 DIAGNOSIS — Z23 Encounter for immunization: Secondary | ICD-10-CM

## 2015-07-14 ENCOUNTER — Encounter: Payer: Self-pay | Admitting: *Deleted

## 2015-08-06 ENCOUNTER — Encounter: Payer: Self-pay | Admitting: Internal Medicine

## 2015-08-06 ENCOUNTER — Ambulatory Visit (INDEPENDENT_AMBULATORY_CARE_PROVIDER_SITE_OTHER): Payer: Commercial Managed Care - HMO | Admitting: Internal Medicine

## 2015-08-06 VITALS — BP 130/78 | HR 84 | Ht 65.0 in | Wt 149.0 lb

## 2015-08-06 DIAGNOSIS — K52838 Other microscopic colitis: Secondary | ICD-10-CM | POA: Diagnosis not present

## 2015-08-06 NOTE — Progress Notes (Signed)
Subjective:    Patient ID: Kimberly Suarez, female    DOB: 09/15/1940, 74 y.o.   MRN: [id number]  HPI Kimberly Suarez is a 74 year old female previously followed by Dr. Sharlett Iles with a past medical history of microscopic colitis, prior to this diagnosis constipation predominant IBS, diverticulosis and GERD. She is here for follow-up and is alone today. She reports that she is doing well though since July she has had issues with return of her diarrhea. This was treated with 2 courses of prednisone taper both over 12 days. She also has a prescription for Lomotil. Fortunately she reports diarrhea has completely resolved over the last 10-12 days. When diarrhea was present she was having 4-6 loose urgent bowel movements. She was having issues with fecal incontinence and accidents. This made it difficult for her to go out in public.   Now she reports good appetite. She denies issues with heartburn today. No trouble swallowing. No nausea or vomiting. No blood in her stool or melena.  Colonoscopic diagnosis of microscopic colitis dates back to 2007. Features of both lymphocytic and collagenous colitis were found at that time. Last colonoscopy however was November 2012 which showed moderate diverticulosis melanosis coli and no polyps.  Review of Systems  as per history of present illness, otherwise negative  Current Medications, Allergies, Past Medical History, Past Surgical History, Family History and Social History were reviewed in [employer] record.     Objective:   Physical Exam BP 130/78 mmHg  Pulse 84  Ht 5\' 5"  (1.651 m)  Wt 149 lb (67.586 kg)  BMI 24.79 kg/m2 Constitutional: Well-developed and well-nourished. No distress. HEENT: Normocephalic and atraumatic. Oropharynx is clear and moist. No oropharyngeal exudate. Conjunctivae are normal.  No scleral icterus. Neck: Neck supple. Trachea midline. Cardiovascular: Normal rate, regular rhythm and intact distal  pulses Pulmonary/chest: Effort normal and breath sounds normal. No wheezing, rales or rhonchi. Abdominal: Soft, nontender, nondistended. Bowel sounds active throughout.  Extremities: no clubbing, cyanosis, or edema Lymphadenopathy: No cervical adenopathy noted. Neurological: Alert and oriented to person place and time. Skin: Skin is warm and dry.  Psychiatric: Normal mood and affect. Behavior is normal.  CBC    Component Value Date/Time   WBC 7.5 06/04/2015 1849   WBC 5.0 05/18/2014 0835   RBC 4.25 06/04/2015 1849   RBC 4.4 05/18/2014 0835   HGB 13.5 11/16/2014 1721   HGB 12.9 05/18/2014 0835   HCT 37.7 06/04/2015 1849   HCT 40.9 11/16/2014 1721   HCT 40.0 05/18/2014 0835   MCV 89 11/16/2014 1721   MCV 90.2 05/18/2014 0835   MCH 29.9 06/04/2015 1849   MCH 29.0 05/18/2014 0835   MCHC 33.7 06/04/2015 1849   MCHC 32.2 05/18/2014 0835   RDW 14.0 06/04/2015 1849   LYMPHSABS 2.8 06/04/2015 1849   EOSABS 0.3 11/16/2014 1721   BASOSABS 0.0 06/04/2015 1849        Assessment & Plan:   74 year old female previously followed by Dr. Sharlett Iles with a past medical history of microscopic colitis, prior to this diagnosis constipation predominant IBS, diverticulosis and GERD.   1. Microscopic colitis -- currently in remission and not requiring steroids. That said she has been treated with 2 steroid tapers over the last 6 months. We discussed the natural history of microscopic colitis and how it does seem to wax and wane. If symptoms return I would recommend consideration of treatment with budesonide 9 mg daily and then tapered as tolerated. Hopefully in the event of  relapse, this medication will be more affordable given the availability of a generic. I asked that she call me if she has return of diarrhea symptoms. She voices understanding  2. CRC screening -- last colonoscopy 2012, repeat would be recommended in 2022. At that time she will be 73 years old and may not need additional colorectal  cancer screening, but will defer this to her overall health at that time

## 2015-08-06 NOTE — Patient Instructions (Signed)
Please follow up with Dr Hilarie Fredrickson in 1 year.  Call our office should your colitis symptoms return. We may try you on budesonide.

## 2015-08-25 ENCOUNTER — Ambulatory Visit: Payer: Self-pay | Admitting: Pharmacist

## 2015-08-26 ENCOUNTER — Encounter: Payer: Self-pay | Admitting: Pharmacist

## 2015-08-26 ENCOUNTER — Ambulatory Visit (INDEPENDENT_AMBULATORY_CARE_PROVIDER_SITE_OTHER): Payer: Commercial Managed Care - HMO | Admitting: Pharmacist

## 2015-08-26 VITALS — BP 132/77 | HR 80 | Ht 66.0 in | Wt 155.0 lb

## 2015-08-26 DIAGNOSIS — R7303 Prediabetes: Secondary | ICD-10-CM | POA: Diagnosis not present

## 2015-08-26 DIAGNOSIS — R739 Hyperglycemia, unspecified: Secondary | ICD-10-CM

## 2015-08-26 DIAGNOSIS — R7309 Other abnormal glucose: Secondary | ICD-10-CM

## 2015-08-26 LAB — POCT GLYCOSYLATED HEMOGLOBIN (HGB A1C): Hemoglobin A1C: 5.7

## 2015-08-26 LAB — GLUCOSE, POCT (MANUAL RESULT ENTRY): POC Glucose: 99 mg/dL (ref 70–99)

## 2015-08-26 NOTE — Patient Instructions (Signed)
Prediabetes Many people have heard about type 2 diabetes, but its common precursor, prediabetes, doesn't get as much attention. Prediabetes is estimated by CDC to affect 86 million Americans (this includes 51% of people 65 years and older), and an estimated 90% of people with prediabetes don't even know it. According to the CDC, 15-30% of these individuals will develop type 2 diabetes within five years. In other words, as many as 26 million people that currently have prediabetes could develop type 2 diabetes by 2020, effectively doubling the number of people with type 2 diabetes in the [place].  What is prediabetes? Prediabetes is a condition where blood sugar levels are higher than normal, but not high enough to be diagnosed as type 2 diabetes. This occurs when the body has problems in processing glucose properly, and sugar starts to build up in the bloodstream instead of fueling cells in muscles and tissues. Insulin is the hormone that tells cells to take up glucose, and in prediabetes, people typically initially develop insulin resistance (where the body's cells can't respond to insulin as well), and over time (if no actions are taken to reverse the situation) the ability to produce sufficient insulin is reduced. People with prediabetes also commonly have high blood pressure as well as abnormal blood lipids (e.g. cholesterol). These often occur prior to the rise of blood glucose levels.  What are the symptoms of prediabetes? People typically do not have symptoms of prediabetes, which is partially why up to 90% of people don't know they have it. The ADA reports that some people with prediabetes may develop symptoms of type 2 diabetes, though even many people diagnosed with type 2 diabetes show little or no symptoms initially at diagnosis.  How is prediabetes diagnosed? According to the American Diabetes Association, prediabetes can be diagnosed through one of the following tests: 1. A glycated hemoglobin  test, also known as HbA1c or simply A1c, gives an idea of the body's average blood sugar levels from the past two or three months. It is usually done with a small drop of blood from a fingerstick or as part of having blood taken in a doctor's office, hospital, or laboratory.  Your A1c in September was 6.4% but today when we checked it has decreased to 5.7%   A1c Level Diagnosis  Less than 5.7% Normal  5.7% to 6.4% Prediabetes  6.5% and higher Diabetes  2. A fasting plasma glucose (FPG) test measures a person's blood glucose level after fasting (not eating) for eight hours - this is typically done in the morning. If a test shows positive for prediabetes, a second test should be taken on a different day to confirm the diagnosis. FPG Level Diagnosis  Less than 100 mg/dl Normal  100 mg/dl to 125 mg/dl Prediabetes  126 mg/dl and higher Diabetes   Who is at risk of developing prediabetes? A well-known paper published in the Lancet in 2010 recommends screening for type 2 diabetes (which would also screen for prediabetes) every 3-5 years in all adults over the age of 28, regardless of other risk factors. Overweight and obese adults (a BMI >25 kg/m2) are also at significantly greater risk for developing prediabetes, as well as people with a family history of type 2 diabetes. According to the CDC, several other factors can have moderate influences on prediabetes risk in addition to age, weight, and family history: People with an [place] American, Hispanic/Latino, American [place] American, or [place] racial or ethnic background. The 2015 ADA Standards of Medical  Care recommendations suggest Asian Americans with a BMI of 23 or above be screened for type 2 diabetes.  Women with a history of diabetes during pregnancy ("gestational diabetes") or have given birth to a baby weighing nine pounds or more. People who are physically active fewer than three times a week. The CDC offers a fast, online  screening test for evaluating the risk for prediabetes. The ADA also offers a screening test to assess type 2 diabetes risk. Of course, these tests do not themselves confirm a prediabetes diagnosis, but just if someone may be at higher risk of developing it.  Why do people develop prediabetes? Prediabetes develops through a combination of factors that are still being investigated. For sure, lifestyle factors (food, exercise, stress, sleep) play a role, but family history and genetics certainly do as well. It is easy to assume that prediabetes is the result of being overweight, but the relationship is not that simple. While obesity is one underlying cause of insulin resistance, many overweight individuals may never develop prediabetes or type 2 diabetes, and a minority of people with prediabetes have never been overweight. To make matters worse, it can be increasingly difficult to make healthy choices in today's toxic food environment that steers all of [place] to make the wrong food choices, and there are many factors that can contribute to weight gain in addition to diet.  Is a prediabetes diagnosis serious? There has been significant debate around the term 'prediabetes,' and whether it should be considered cause for alarm. On the one hand, it serves as a risk factor for type 2 diabetes and a host of other complications, including heart disease, and ultimately prediabetes implies that a degree of metabolic problems have started to occur in the body. On the other hand, it places a diagnosis on many people who may never develop type 2 diabetes. Again, according to the CDC, 15-30% of those with prediabetes will develop type 2 diabetes within five years. However, a 2012 Lancet article cites 5-10% of those with prediabetes each year will also revert back to healthy blood sugars. What's critical is not necessarily the cutoff itself, but where someone falls within the ranges listed above. The level of risk of developing  type 2 diabetes is closely related to A1c or FPG at diagnosis. Those in the higher ranges (A1c closer to 6.4%, FPG closer to 125 mg/dl) are much more likely to progress to type 2 diabetes, whereas those at lower ranges (A1c closer to 5.7%, FPG closer to 100 mg/dl) are relatively more likely to revert back to normal glucose levels or stay within the prediabetes range. Age of diagnosis and the level of insulin production still occurring at diagnosis also impact the chances of reverting to normoglycemia (normal blood sugar levels).  What can people with prediabetes do to avoid the progression from prediabetes to type 2 diabetes? The most important action people diagnosed with prediabetes can take is to focus on living a healthy lifestyle. This includes making healthy food choices, controlling portions, and increasing physical activity. Regarding weight control, research shows losing 5-7% (often about 10-20 lbs.) from your initial body weight and keeping off as much of that weight over time as possible is critical to lowering the risk of type 2 diabetes. This task is of course easier said than done, but sustained weight loss over time can be key to improving health and delaying or preventing the onset of type 2 diabetes. Several prediabetes interventions exist based on evidence from the landmark Diabetes  Prevention Program (DPP) study. The DPP study reported that moderate weight loss (5-7% of body weight, or ~10-15 lbs. for someone weighing 200 lbs.), counseling, and education on healthy eating and behavior reduced the risk of developing type 2 diabetes by 58%. Data presented at the [place] 2014 conference showed that after 15 years of follow-up of the DPP study groups, the results were still encouraging: 27% of those in the original lifestyle group had a significant reduction in type 2 diabetes progression compared to the control group. If you or someone you know has been told they have prediabetes, here are a few  helpful resources: In-person diabetes prevention programs: The CDC offers a one year long lifestyle change program through its National Diabetes Prevention Program (NDPP) at various locations throughout the [place] to help participants adopt healthy habits and prevent or delay progression to type 2 diabetes. This program is a major undertaking by the CDC to translate the findings from the DPP study into a real world setting, a significant effort indeed! Online diabetes prevention programs: The CDC has now given pending recognition status to three digital prevention programs: [place], and [facility]. These offer the same one year long educational curriculum as the DPP study, but in an online format. Some insurance companies and employers cover these programs, and you can find more information at the links above. These digital versions are excellent options for those who live far away from NDPP locations or who prefer the anonymity and convenience of doing the program online. Metformin: The DPP study found that metformin, the safest first-line therapy for type 2 diabetes, may help delay the onset of type 2 diabetes in people with prediabetes. Participants who took the low-cost generic drug had a 31% reduced risk of developing type 2 diabetes compared to the control group (those not on metformin or intensive lifestyle intervention). Again, 15-year follow up data showed that 17% of those on metformin continued to have a significant reduction in type 2 progression. At this time, metformin (or any other medication, for that matter) is not currently FDA approved for prediabetes, and it is sometimes prescribed "off-label" by a healthcare provider. Your healthcare provider can give you more information and determine whether metformin is a good option for you.  Can prediabetes be "cured"? In the early stages of prediabetes (and type 2 diabetes), diligent attention to food choices and activity, and most  importantly weight loss, can improve blood sugar numbers, effectively "reversing" the disease and reducing the odds of developing type 2 diabetes. However, some people may have underlying factors (such as family history and genetics) that put them at a greater risk of type 2 diabetes, meaning they will always require careful attention to blood sugar levels and lifestyle choices. Returning to old habits will likely put someone back on the road to prediabetes, and eventually, type 2 diabetes     Hyperglycemia High blood sugar (hyperglycemia) means that the level of sugar in your blood is higher than it should be. Signs of high blood sugar include:  Feeling thirsty.  Frequent peeing (urinating).  Feeling tired or sleepy.  Dry mouth.  Vision changes.  Feeling weak.  Feeling hungry but losing weight.  Numbness and tingling in your hands or feet.  Headache. When you ignore these signs, your blood sugar may keep going up. These problems may get worse, and other problems may begin. HOME CARE  Check your blood sugars as told by your doctor. Write down the numbers with the date  and time.  Take the right amount of insulin or diabetes pills at the right time. Write down the dose with date and time.  Refill your insulin or diabetes pills before running out.  Watch what you eat. Follow your meal plan.  Drink liquids without sugar, such as water. Check with your doctor if you have kidney or heart disease.  Follow your doctor's orders for exercise. Exercise at the same time of day.  Keep your doctor's appointments. GET HELP RIGHT AWAY IF:   You have trouble thinking or are confused.  You have fast breathing with fruity smelling breath.  You pass out (faint).  You have 2 to 3 days of high blood sugars and you do not know why.  You have chest pain.  You are feeling sick to your stomach (nauseous) or throwing up (vomiting).  You have sudden vision changes. MAKE SURE YOU:    Understand these instructions.  Will watch your condition.  Will get help right away if you are not doing well or get worse.   This information is not intended to replace advice given to you by your health care provider. Make sure you discuss any questions you have with your health care provider.   Document Released: 06/11/2009 Document Revised: 09/04/2014 Document Reviewed: 04/20/2015 Elsevier Interactive Patient Education [employer].

## 2015-08-26 NOTE — Progress Notes (Signed)
  Subjective:    Kimberly Suarez is a 74 y.o. female who presents for follow-up of pre-diabetes.  She had been taking prednisone when last A1c was checked - 05/25/2015.  A1c was 6.4%.  She was prescribed metformin by Dr Laurance Flatten but she did not start because she was having diarrhea at the time.  She has since see GI - Dr. Hilarie Fredrickson who noted that she had a past medical history of microscopic colitis,  constipation predominant IBS, diverticulosis and GERD.  Diarrhea and fecal incontinence have both resolved  Cardiovascular risk factors: advanced age (older than 33 for men, 20 for women), dyslipidemia and hypertension Eye exam current (within one year): yes Weight trend: fluctuating a bit Prior visit with CDE - yes, visit with me last 06/09/2015 Current diet: in general, a "healthy" diet  , well balanced, and tries to limit serving sizes of high CHO foods. Current exercise: none over the last 2 weeks because she has been caring for her 9yo granddaughter who had surgery to remove brain tumor in November.  She is planning to restart exercise at beginning of 2017.  Current monitoring regimen: none  Is She on ACE inhibitor or angiotensin II receptor blocker?  Yes  enalapril (Vasotec)    The following portions of the patient's history were reviewed and updated as appropriate: allergies, current medications and problem list.  Review of Systems Constitutional: positive for none, negative for anorexia, fatigue, fevers and weight loss Gastrointestinal: positive for reflux symptoms, negative for abdominal pain, change in bowel habits, diarrhea, melena, nausea and vomiting Endocrine: positive for none, negative for diabetic symptoms including blurry vision, increased fatigue, polydipsia, polyphagia, polyuria, poor wound healing, skin dryness and weight loss and temperature intolerance    Objective:    BP 132/77 mmHg  Pulse 80  Ht 5\' 6"  (1.676 m)  Wt 155 lb (70.308 kg)  BMI 25.03 kg/m2   FBG was 99 in  office today A1c = 5.7% today (down from 6.4% in September 2016)  Lab Review GLUCOSE (mg/dL)  Date Value  05/24/2015 120*  03/27/2015 92  11/16/2014 112*   GLUCOSE, BLD (mg/dL)  Date Value  12/02/2012 99   CO2 (mmol/L)  Date Value  05/24/2015 25  03/27/2015 25  11/16/2014 25   BUN (mg/dL)  Date Value  05/24/2015 14  03/27/2015 16  11/16/2014 25  12/02/2012 19   CREAT (mg/dL)  Date Value  12/02/2012 1.28*   CREATININE, SER (mg/dL)  Date Value  05/24/2015 1.27*  03/27/2015 1.35*  11/16/2014 1.21*    Assessment:    Pre Diabetes with adherence to diet - decrease in A1c  Plan:    1.  Remain off metformin 2.  Patient reminded of s/s of elevated BG.  She is to call if she experiences any of noted symptoms. 3.  Reviewed prediabetes and risk of developing type 2 DM 4.  Reviewed CHO limiting diet - patient to continue 5.  Patient has plans to restart regular exercise MWF at community center in January 2017.   6.  Patient given appt for DEXA recheck (missed appt in November due to granddaughter's surgery and she is not able to stay longer today to have done. ) RTC 10/3015 to follow up with PCP [facility] - 04/2016  Cherre Robins, PharmD, CPP, CDE

## 2015-09-08 ENCOUNTER — Other Ambulatory Visit (INDEPENDENT_AMBULATORY_CARE_PROVIDER_SITE_OTHER): Payer: Medicare Other

## 2015-09-08 DIAGNOSIS — Z780 Asymptomatic menopausal state: Secondary | ICD-10-CM

## 2015-09-14 ENCOUNTER — Ambulatory Visit (INDEPENDENT_AMBULATORY_CARE_PROVIDER_SITE_OTHER): Payer: Medicare Other | Admitting: Family Medicine

## 2015-09-14 ENCOUNTER — Encounter: Payer: Self-pay | Admitting: Family Medicine

## 2015-09-14 VITALS — BP 116/79 | HR 81 | Temp 97.5°F | Ht 66.0 in | Wt 148.0 lb

## 2015-09-14 DIAGNOSIS — N390 Urinary tract infection, site not specified: Secondary | ICD-10-CM | POA: Diagnosis not present

## 2015-09-14 DIAGNOSIS — R110 Nausea: Secondary | ICD-10-CM | POA: Diagnosis not present

## 2015-09-14 DIAGNOSIS — R42 Dizziness and giddiness: Secondary | ICD-10-CM

## 2015-09-14 DIAGNOSIS — N289 Disorder of kidney and ureter, unspecified: Secondary | ICD-10-CM | POA: Diagnosis not present

## 2015-09-14 DIAGNOSIS — A084 Viral intestinal infection, unspecified: Secondary | ICD-10-CM

## 2015-09-14 LAB — POCT URINALYSIS DIPSTICK
Bilirubin, UA: NEGATIVE
Glucose, UA: NEGATIVE
Ketones, UA: NEGATIVE
Nitrite, UA: NEGATIVE
Protein, UA: NEGATIVE
Spec Grav, UA: <=1.005
Urobilinogen, UA: NEGATIVE
pH, UA: 5

## 2015-09-14 LAB — POCT UA - MICROSCOPIC ONLY
Casts, Ur, LPF, POC: NEGATIVE
Crystals, Ur, HPF, POC: NEGATIVE
Mucus, UA: NEGATIVE
Yeast, UA: NEGATIVE

## 2015-09-14 MED ORDER — SULFAMETHOXAZOLE-TRIMETHOPRIM 800-160 MG PO TABS: 1.0000 | ORAL_TABLET | Freq: Two times a day (BID) | ORAL | Status: DC

## 2015-09-14 NOTE — Addendum Note (Signed)
Addended by: Chipper Herb on: 09/14/2015 05:30 PM   Modules accepted: Orders

## 2015-09-14 NOTE — Addendum Note (Signed)
Addended by: Earlene Plater on: 09/14/2015 04:27 PM   Modules accepted: Orders

## 2015-09-14 NOTE — Patient Instructions (Signed)
Clear liquids 24 hours and then full liquids the next 24 hours and bland diet the third 24 hours Avoid milk and dairy products and caffeine Increase diet gradually and get rest Hold the fluid pill tomorrow and then resume the next day with a half a pill and then the third day back to a whole pill Call back sooner if condition worsens  Get plenty of rest

## 2015-09-14 NOTE — Progress Notes (Addendum)
Subjective:    Patient ID: Kimberly Suarez, female    DOB: Feb 22, 1941, 75 y.o.   MRN: [id number]  HPI Patient here today for an episode that started about 5 am this morning and lasted about 5 mins. She had dizziness, nausea and diarrhea. She is taking medications regularly and she is accompanied today by her daughter. The patient also takes a diuretic. She does not check her blood pressures regularly. No one else at home has been sick. Patient denies chest pain shortness of breath or trouble passing her water.     Patient Active Problem List   Diagnosis Date Noted  . Pre-diabetes 06/01/2014  . Renal insufficiency 11/18/2013  . Osteoarthritis of left knee 11/18/2013  . HTN (hypertension) 06/26/2013  . Lymphocytic colitis 10/30/2012  . Hyperlipidemia 10/30/2012  . Unspecified constipation 07/10/2011  . Dysphagia, unspecified(787.20) 07/10/2011  . Diverticulosis of colon (without mention of hemorrhage) 07/10/2011  . Special screening for malignant neoplasms, colon 07/10/2011  . GERD with stricture 07/10/2011   Outpatient Encounter Prescriptions as of 09/14/2015  Medication Sig  . aspirin 81 MG tablet Take 81 mg by mouth daily.    Marland Kitchen atorvastatin (LIPITOR) 80 MG tablet Take 1 tablet (80 mg total) by mouth daily.  . Calcium Carbonate-Simethicone (TUMS PLUS PO) Take 1 tablet by mouth as needed.  . enalapril (VASOTEC) 10 MG tablet Take 1 tablet (10 mg total) by mouth 2 (two) times daily. (Patient taking differently: Take 10 mg by mouth daily. )  . estradiol (ESTRACE) 0.1 MG/GM vaginal cream Place 1 Applicatorful vaginally at bedtime.  . Glucosamine-Chondroitin (GLUCOSAMINE CHONDR COMPLEX PO) Take 1 tablet by mouth 2 (two) times daily.   . hydrochlorothiazide (HYDRODIURIL) 25 MG tablet Take 1 tablet (25 mg total) by mouth daily.  . Omega-3 Fatty Acids (FISH OIL) 1000 MG CAPS Take 1 capsule by mouth daily.    No facility-administered encounter medications on file as of 09/14/2015.       Review of Systems  HENT: Negative.   Eyes: Negative.   Respiratory: Negative.   Cardiovascular: Negative.   Gastrointestinal: Negative.   Endocrine: Negative.   Genitourinary: Negative.   Musculoskeletal: Negative.   Skin: Negative.   Allergic/Immunologic: Negative.   Neurological: Positive for weakness.  Hematological: Negative.   Psychiatric/Behavioral: Negative.        Objective:   Physical Exam  Constitutional: She is oriented to person, place, and time. She appears well-developed and well-nourished. No distress.  HENT:  Head: Normocephalic and atraumatic.  Right Ear: External ear normal.  Left Ear: External ear normal.  Nose: Nose normal.  Mouth/Throat: Oropharynx is clear and moist.  Eyes: Conjunctivae and EOM are normal. Pupils are equal, round, and reactive to light. Right eye exhibits no discharge. Left eye exhibits no discharge. No scleral icterus.  Neck: Normal range of motion. Neck supple. No thyromegaly present.  Cardiovascular: Normal rate, regular rhythm and normal heart sounds.   No murmur heard. Pulmonary/Chest: Effort normal and breath sounds normal. No respiratory distress. She has no wheezes. She has no rales. She exhibits no tenderness.  Abdominal: Soft. Bowel sounds are normal. She exhibits no mass. There is tenderness. There is no rebound and no guarding.  Slight suprapubic and left lower quadrant tenderness  Musculoskeletal: Normal range of motion. She exhibits no edema.  Lymphadenopathy:    She has no cervical adenopathy.  Neurological: She is alert and oriented to person, place, and time.  Skin: Skin is warm and dry. No rash noted.  Psychiatric: She has a normal mood and affect. Her behavior is normal. Judgment and thought content normal.  Nursing note and vitals reviewed.   BP 116/79 mmHg  Pulse 81  Temp(Src) 97.5 F (36.4 C) (Oral)  Ht 5' 6"  (1.676 m)  Wt 148 lb (67.132 kg)  BMI 23.90 kg/m2  Results for orders placed or performed  in visit on 09/14/15  POCT urinalysis dipstick  Result Value Ref Range   Color, UA gold    Clarity, UA clear    Glucose, UA negative    Bilirubin, UA negative    Ketones, UA negative    Spec Grav, UA <=1.005    Blood, UA trace    pH, UA 5.0    Protein, UA negative    Urobilinogen, UA negative    Nitrite, UA negative    Leukocytes, UA large (3+) (A) Negative  POCT UA - Microscopic Only  Result Value Ref Range   WBC, Ur, HPF, POC 80-100    RBC, urine, microscopic 1-3    Bacteria, U Microscopic few    Mucus, UA negative    Epithelial cells, urine per micros moderate    Crystals, Ur, HPF, POC negative    Casts, Ur, LPF, POC negative    Yeast, UA negative    The patient was called after she left the office and this prescription was called in for her to [facility] and she was told to take this with some food and not on an empty stomach until the culture and sensitivity was returned     Assessment & Plan:  1. Dizziness -Rest fluids - CBC with Differential/Platelet - BMP8+EGFR - Hepatic function panel  2. Nausea without vomiting -Gradually increase diet as directed and avoid milk cheese caffeine and dairy products - CBC with Differential/Platelet - BMP8+EGFR - Hepatic function panel  3. Viral gastroenteritis -Clear liquids for 24 hours then full liquids the second 24 hours and bland diet the third 24 hours  Patient Instructions  Clear liquids 24 hours and then full liquids the next 24 hours and bland diet the third 24 hours Avoid milk and dairy products and caffeine Increase diet gradually and get rest Hold the fluid pill tomorrow and then resume the next day with a half a pill and then the third day back to a whole pill Call back sooner if condition worsens  Get plenty of rest   Arrie Senate MD

## 2015-09-15 LAB — HEPATIC FUNCTION PANEL
ALT: 15 [IU]/L (ref 0–32)
AST: 22 [IU]/L (ref 0–40)
Albumin: 3.9 g/dL (ref 3.5–4.8)
Alkaline Phosphatase: 47 [IU]/L (ref 39–117)
Bilirubin Total: 0.4 mg/dL (ref 0.0–1.2)
Bilirubin, Direct: 0.13 mg/dL (ref 0.00–0.40)
Total Protein: 6.9 g/dL (ref 6.0–8.5)

## 2015-09-15 LAB — BMP8+EGFR
BUN/Creatinine Ratio: 17 (ref 11–26)
BUN: 21 mg/dL (ref 8–27)
CO2: 27 mmol/L (ref 18–29)
Calcium: 10 mg/dL (ref 8.7–10.3)
Chloride: 96 mmol/L (ref 96–106)
Creatinine, Ser: 1.23 mg/dL — ABNORMAL HIGH (ref 0.57–1.00)
GFR calc Af Amer: 50 mL/min/{1.73_m2} — ABNORMAL LOW
GFR calc non Af Amer: 43 mL/min/{1.73_m2} — ABNORMAL LOW
Glucose: 98 mg/dL (ref 65–99)
Potassium: 3.8 mmol/L (ref 3.5–5.2)
Sodium: 139 mmol/L (ref 134–144)

## 2015-09-15 LAB — CBC WITH DIFFERENTIAL/PLATELET
Basophils Absolute: 0 10*3/uL (ref 0.0–0.2)
Basos: 0 %
EOS (ABSOLUTE): 0.3 10*3/uL (ref 0.0–0.4)
Eos: 3 %
Hematocrit: 37.8 % (ref 34.0–46.6)
Hemoglobin: 13 g/dL (ref 11.1–15.9)
Immature Grans (Abs): 0 10*3/uL (ref 0.0–0.1)
Immature Granulocytes: 0 %
Lymphocytes Absolute: 2.6 10*3/uL (ref 0.7–3.1)
Lymphs: 30 %
MCH: 30 pg (ref 26.6–33.0)
MCHC: 34.4 g/dL (ref 31.5–35.7)
MCV: 87 fL (ref 79–97)
Monocytes Absolute: 0.6 10*3/uL (ref 0.1–0.9)
Monocytes: 7 %
Neutrophils Absolute: 5.2 10*3/uL (ref 1.4–7.0)
Neutrophils: 60 %
Platelets: 229 10*3/uL (ref 150–379)
RBC: 4.33 x10E6/uL (ref 3.77–5.28)
RDW: 13.4 % (ref 12.3–15.4)
WBC: 8.7 10*3/uL (ref 3.4–10.8)

## 2015-09-16 ENCOUNTER — Telehealth: Payer: Self-pay | Admitting: *Deleted

## 2015-09-16 LAB — URINE CULTURE

## 2015-09-16 NOTE — Telephone Encounter (Signed)
-----   Message from Chipper Herb, MD sent at 09/16/2015  7:21 AM EST ----- The urine culture had minimal growth of bacteria and even though she has started the antibiotic she should take it for one more day and discontinue it------------- that we'll give her 3 days of treatment.

## 2015-09-16 NOTE — Telephone Encounter (Signed)
Pt notified of results Verbalizes understanding 

## 2015-11-11 ENCOUNTER — Ambulatory Visit (INDEPENDENT_AMBULATORY_CARE_PROVIDER_SITE_OTHER): Payer: Medicare Other | Admitting: Gynecology

## 2015-11-11 ENCOUNTER — Encounter: Payer: Self-pay | Admitting: Gynecology

## 2015-11-11 VITALS — BP 120/76 | Ht 64.0 in | Wt 151.0 lb

## 2015-11-11 DIAGNOSIS — N898 Other specified noninflammatory disorders of vagina: Secondary | ICD-10-CM

## 2015-11-11 DIAGNOSIS — N8189 Other female genital prolapse: Secondary | ICD-10-CM | POA: Diagnosis not present

## 2015-11-11 DIAGNOSIS — R8290 Unspecified abnormal findings in urine: Secondary | ICD-10-CM | POA: Diagnosis not present

## 2015-11-11 DIAGNOSIS — Z01419 Encounter for gynecological examination (general) (routine) without abnormal findings: Secondary | ICD-10-CM

## 2015-11-11 DIAGNOSIS — N952 Postmenopausal atrophic vaginitis: Secondary | ICD-10-CM

## 2015-11-11 LAB — WET PREP FOR TRICH, YEAST, CLUE
Clue Cells Wet Prep HPF POC: NONE SEEN
Trich, Wet Prep: NONE SEEN
Yeast Wet Prep HPF POC: NONE SEEN

## 2015-11-11 MED ORDER — FLUCONAZOLE 150 MG PO TABS: 150.0000 mg | ORAL_TABLET | Freq: Once | ORAL | Status: DC

## 2015-11-11 NOTE — Patient Instructions (Addendum)
Take the 1 yeast pill that I prescribed at your pharmacy. Do not take your cholesterol medicine the same day you do this.    Menopause is a normal process in which your reproductive ability comes to an end. This process happens gradually over a span of months to years, usually between the ages of 53 and 42. Menopause is complete when you have missed 12 consecutive menstrual periods. It is important to talk with your health care provider about some of the most common conditions that affect postmenopausal women, such as heart disease, cancer, and bone loss (osteoporosis). Adopting a healthy lifestyle and getting preventive care can help to promote your health and wellness. Those actions can also lower your chances of developing some of these common conditions. WHAT SHOULD I KNOW ABOUT MENOPAUSE? During menopause, you may experience a number of symptoms, such as:  Moderate-to-severe hot flashes.  Night sweats.  Decrease in sex drive.  Mood swings.  Headaches.  Tiredness.  Irritability.  Memory problems.  Insomnia. Choosing to treat or not to treat menopausal changes is an individual decision that you make with your health care provider. WHAT SHOULD I KNOW ABOUT HORMONE REPLACEMENT THERAPY AND SUPPLEMENTS? Hormone therapy products are effective for treating symptoms that are associated with menopause, such as hot flashes and night sweats. Hormone replacement carries certain risks, especially as you become older. If you are thinking about using estrogen or estrogen with progestin treatments, discuss the benefits and risks with your health care provider. WHAT SHOULD I KNOW ABOUT HEART DISEASE AND STROKE? Heart disease, heart attack, and stroke become more likely as you age. This may be due, in part, to the hormonal changes that your body experiences during menopause. These can affect how your body processes dietary fats, triglycerides, and cholesterol. Heart attack and stroke are both medical  emergencies. There are many things that you can do to help prevent heart disease and stroke:  Have your blood pressure checked at least every 1-2 years. High blood pressure causes heart disease and increases the risk of stroke.  If you are 3-30 years old, ask your health care provider if you should take aspirin to prevent a heart attack or a stroke.  Do not use any tobacco products, including cigarettes, chewing tobacco, or electronic cigarettes. If you need help quitting, ask your health care provider.  It is important to eat a healthy diet and maintain a healthy weight.  Be sure to include plenty of vegetables, fruits, low-fat dairy products, and lean protein.  Avoid eating foods that are high in solid fats, added sugars, or salt (sodium).  Get regular exercise. This is one of the most important things that you can do for your health.  Try to exercise for at least 150 minutes each week. The type of exercise that you do should increase your heart rate and make you sweat. This is known as moderate-intensity exercise.  Try to do strengthening exercises at least twice each week. Do these in addition to the moderate-intensity exercise.  Know your numbers.Ask your health care provider to check your cholesterol and your blood glucose. Continue to have your blood tested as directed by your health care provider. WHAT SHOULD I KNOW ABOUT CANCER SCREENING? There are several types of cancer. Take the following steps to reduce your risk and to catch any cancer development as early as possible. Breast Cancer  Practice breast self-awareness.  This means understanding how your breasts normally appear and feel.  It also means doing regular breast  self-exams. Let your health care provider know about any changes, no matter how small.  If you are 90 or older, have a clinician do a breast exam (clinical breast exam or CBE) every year. Depending on your age, family history, and medical history, it may  be recommended that you also have a yearly breast X-ray (mammogram).  If you have a family history of breast cancer, talk with your health care provider about genetic screening.  If you are at high risk for breast cancer, talk with your health care provider about having an MRI and a mammogram every year.  Breast cancer (BRCA) gene test is recommended for women who have family members with BRCA-related cancers. Results of the assessment will determine the need for genetic counseling and BRCA1 and for BRCA2 testing. BRCA-related cancers include these types:  Breast. This occurs in males or females.  Ovarian.  Tubal. This may also be called fallopian tube cancer.  Cancer of the abdominal or pelvic lining (peritoneal cancer).  Prostate.  Pancreatic. Cervical, Uterine, and Ovarian Cancer Your health care provider may recommend that you be screened regularly for cancer of the pelvic organs. These include your ovaries, uterus, and vagina. This screening involves a pelvic exam, which includes checking for microscopic changes to the surface of your cervix (Pap test).  For women ages 21-65, health care providers may recommend a pelvic exam and a Pap test every three years. For women ages 44-65, they may recommend the Pap test and pelvic exam, combined with testing for human papilloma virus (HPV), every five years. Some types of HPV increase your risk of cervical cancer. Testing for HPV may also be done on women of any age who have unclear Pap test results.  Other health care providers may not recommend any screening for nonpregnant women who are considered low risk for pelvic cancer and have no symptoms. Ask your health care provider if a screening pelvic exam is right for you.  If you have had past treatment for cervical cancer or a condition that could lead to cancer, you need Pap tests and screening for cancer for at least 20 years after your treatment. If Pap tests have been discontinued for you,  your risk factors (such as having a new sexual partner) need to be reassessed to determine if you should start having screenings again. Some women have medical problems that increase the chance of getting cervical cancer. In these cases, your health care provider may recommend that you have screening and Pap tests more often.  If you have a family history of uterine cancer or ovarian cancer, talk with your health care provider about genetic screening.  If you have vaginal bleeding after reaching menopause, tell your health care provider.  There are currently no reliable tests available to screen for ovarian cancer. Lung Cancer Lung cancer screening is recommended for adults 88-58 years old who are at high risk for lung cancer because of a history of smoking. A yearly low-dose CT scan of the lungs is recommended if you:  Currently smoke.  Have a history of at least 30 pack-years of smoking and you currently smoke or have quit within the past 15 years. A pack-year is smoking an average of one pack of cigarettes per day for one year. Yearly screening should:  Continue until it has been 15 years since you quit.  Stop if you develop a health problem that would prevent you from having lung cancer treatment. Colorectal Cancer  This type of cancer can  be detected and can often be prevented.  Routine colorectal cancer screening usually begins at age 43 and continues through age 95.  If you have risk factors for colon cancer, your health care provider may recommend that you be screened at an earlier age.  If you have a family history of colorectal cancer, talk with your health care provider about genetic screening.  Your health care provider may also recommend using home test kits to check for hidden blood in your stool.  A small camera at the end of a tube can be used to examine your colon directly (sigmoidoscopy or colonoscopy). This is done to check for the earliest forms of colorectal  cancer.  Direct examination of the colon should be repeated every 5-10 years until age 35. However, if early forms of precancerous polyps or small growths are found or if you have a family history or genetic risk for colorectal cancer, you may need to be screened more often. Skin Cancer  Check your skin from head to toe regularly.  Monitor any moles. Be sure to tell your health care provider:  About any new moles or changes in moles, especially if there is a change in a mole's shape or color.  If you have a mole that is larger than the size of a pencil eraser.  If any of your family members has a history of skin cancer, especially at a young age, talk with your health care provider about genetic screening.  Always use sunscreen. Apply sunscreen liberally and repeatedly throughout the day.  Whenever you are outside, protect yourself by wearing long sleeves, pants, a wide-brimmed hat, and sunglasses. WHAT SHOULD I KNOW ABOUT OSTEOPOROSIS? Osteoporosis is a condition in which bone destruction happens more quickly than new bone creation. After menopause, you may be at an increased risk for osteoporosis. To help prevent osteoporosis or the bone fractures that can happen because of osteoporosis, the following is recommended:  If you are 23-42 years old, get at least 1,000 mg of calcium and at least 600 mg of vitamin D per day.  If you are older than age 25 but younger than age 59, get at least 1,200 mg of calcium and at least 600 mg of vitamin D per day.  If you are older than age 46, get at least 1,200 mg of calcium and at least 800 mg of vitamin D per day. Smoking and excessive alcohol intake increase the risk of osteoporosis. Eat foods that are rich in calcium and vitamin D, and do weight-bearing exercises several times each week as directed by your health care provider. WHAT SHOULD I KNOW ABOUT HOW MENOPAUSE AFFECTS [place]? Depression may occur at any age, but it is more common as  you become older. Common symptoms of depression include:  Low or sad mood.  Changes in sleep patterns.  Changes in appetite or eating patterns.  Feeling an overall lack of motivation or enjoyment of activities that you previously enjoyed.  Frequent crying spells. Talk with your health care provider if you think that you are experiencing depression. WHAT SHOULD I KNOW ABOUT IMMUNIZATIONS? It is important that you get and maintain your immunizations. These include:  Tetanus, diphtheria, and pertussis (Tdap) booster vaccine.  Influenza every year before the flu season begins.  Pneumonia vaccine.  Shingles vaccine. Your health care provider may also recommend other immunizations.   This information is not intended to replace advice given to you by your health care provider. Make sure you discuss  any questions you have with your health care provider.   Document Released: 10/06/2005 Document Revised: 09/04/2014 Document Reviewed: 04/16/2014 Elsevier Interactive Patient Education [employer].

## 2015-11-11 NOTE — Progress Notes (Signed)
    Kimberly Suarez Jun 08, 1941 [id number]        74 y.o.  G3P3003  for breast and pelvic exam. Several issues noted below.  Past medical history,surgical history, problem list, medications, allergies, family history and social history were all reviewed and documented as reviewed in the EPIC chart.  ROS:  Performed with pertinent positives and negatives included in the history, assessment and plan.   Additional significant findings :  none   Exam: [name] assistant Filed Vitals:   11/11/15 1104  BP: 120/76  Height: 5\' 4"  (1.626 m)  Weight: 151 lb (68.493 kg)   General appearance:  Normal affect, orientation and appearance. Skin: Grossly normal HEENT: Without gross lesions.  No cervical or supraclavicular adenopathy. Thyroid normal.  Lungs:  Clear without wheezing, rales or rhonchi Cardiac: RR, without RMG Abdominal:  Soft, nontender, without masses, guarding, rebound, organomegaly or hernia Breasts:  Examined lying and sitting without masses, retractions, discharge or axillary adenopathy. Pelvic:  Ext/BUS/vagina with atrophic changes.  First to second-degree uterine prolapse, first to second-degree cystocele, first-degree rectocele.  Slight white discharge.  Cervix with atrophic changes  Uterus axial to anteverted, normal size, shape and contour, midline and mobile nontender   Adnexa without masses or tenderness    Anus and perineum normal   Rectovaginal normal sphincter tone without palpated masses or tenderness.    Assessment/Plan:  75 y.o. G59P3003 female for breast and pelvic exam.   1. Pelvic relaxation. With cystocele/rectocele/uterine prolapse. Uses ring pessary intermittently that she can put in and take out herself whenever she is up and feels more pressure. Is doing well with this. No evidence of irritation or erosion.  Follow up if any issues. 2. Postmenopausal/atrophic genital changes. No significant hot flushes, night sweats, vaginal dryness or bleeding. Uses  Estrace vaginal cream occasionally for dryness but overall is not using on a regular basis. Has supply and will call if she needs more. Risks of absorption with systemic effects to include thrombosis endometrial stimulation and breast cancer all reviewed. 3. Intermittent slight vaginal discharge. Patient notes particularly when she wears her pessary.  Exam shows a slight white discharge. Wet prep is negative. Going to cover her regardless with Diflucan 150 mg 1 dose. She is off her cholesterol medicine now and have asked her just to hold until she takes her Diflucan and then to restart her cholesterol medicine as she chooses in conjunction with her primary physician. 4. DEXA 08/2015. Is normal but notes significant degenerative changes in the spine. She'll follow up with her primary physician reference to this ordered this study. 5. Colonoscopy 2012. Repeat at their recommended interval. 6. Mammography 05/2015. Continue with annual mammography when due. SBE monthly reviewed. 7. Health maintenance. No routine lab work done as patient reports this done at her primary physician's office. Follow up 1 year, sooner as needed.   [name], 11:25 AM 11/11/2015

## 2015-11-12 LAB — URINALYSIS W MICROSCOPIC + REFLEX CULTURE
Bilirubin Urine: NEGATIVE
Casts: NONE SEEN [LPF]
Crystals: NONE SEEN [HPF]
Glucose, UA: NEGATIVE
Hgb urine dipstick: NEGATIVE
Ketones, ur: NEGATIVE
Nitrite: NEGATIVE
Protein, ur: NEGATIVE
RBC / HPF: NONE SEEN RBC/HPF
Specific Gravity, Urine: 1.015 (ref 1.001–1.035)
Yeast: NONE SEEN [HPF]
pH: 6 (ref 5.0–8.0)

## 2015-11-13 LAB — URINE CULTURE: Colony Count: >=100000

## 2015-11-16 ENCOUNTER — Telehealth: Payer: Self-pay | Admitting: Family Medicine

## 2015-11-16 ENCOUNTER — Other Ambulatory Visit: Payer: Self-pay | Admitting: Gynecology

## 2015-11-16 MED ORDER — AMPICILLIN 500 MG PO CAPS: 500.0000 mg | ORAL_CAPSULE | Freq: Four times a day (QID) | ORAL | Status: DC

## 2015-11-16 NOTE — Telephone Encounter (Signed)
Patient aware that she is still ok to be seen even if she is finishing ampicillin for group b strep in urine.

## 2015-11-16 NOTE — Progress Notes (Signed)
Patient informed. Rx sent 

## 2015-11-22 ENCOUNTER — Encounter: Payer: Self-pay | Admitting: Family Medicine

## 2015-11-22 ENCOUNTER — Ambulatory Visit (INDEPENDENT_AMBULATORY_CARE_PROVIDER_SITE_OTHER): Payer: Medicare Other

## 2015-11-22 ENCOUNTER — Ambulatory Visit (INDEPENDENT_AMBULATORY_CARE_PROVIDER_SITE_OTHER): Payer: Medicare Other | Admitting: Family Medicine

## 2015-11-22 VITALS — BP 106/64 | HR 66 | Temp 97.8°F | Ht 64.0 in | Wt 149.0 lb

## 2015-11-22 DIAGNOSIS — E559 Vitamin D deficiency, unspecified: Secondary | ICD-10-CM | POA: Diagnosis not present

## 2015-11-22 DIAGNOSIS — Z87440 Personal history of urinary (tract) infections: Secondary | ICD-10-CM | POA: Diagnosis not present

## 2015-11-22 DIAGNOSIS — I10 Essential (primary) hypertension: Secondary | ICD-10-CM

## 2015-11-22 DIAGNOSIS — Z1211 Encounter for screening for malignant neoplasm of colon: Secondary | ICD-10-CM

## 2015-11-22 DIAGNOSIS — K219 Gastro-esophageal reflux disease without esophagitis: Secondary | ICD-10-CM

## 2015-11-22 DIAGNOSIS — N289 Disorder of kidney and ureter, unspecified: Secondary | ICD-10-CM | POA: Diagnosis not present

## 2015-11-22 DIAGNOSIS — E785 Hyperlipidemia, unspecified: Secondary | ICD-10-CM | POA: Diagnosis not present

## 2015-11-22 DIAGNOSIS — M25562 Pain in left knee: Secondary | ICD-10-CM | POA: Diagnosis not present

## 2015-11-22 DIAGNOSIS — M25561 Pain in right knee: Secondary | ICD-10-CM

## 2015-11-22 DIAGNOSIS — K222 Esophageal obstruction: Secondary | ICD-10-CM | POA: Diagnosis not present

## 2015-11-22 DIAGNOSIS — R7303 Prediabetes: Secondary | ICD-10-CM

## 2015-11-22 LAB — URINALYSIS, COMPLETE
Bilirubin, UA: NEGATIVE
Glucose, UA: NEGATIVE
Ketones, UA: NEGATIVE
Nitrite, UA: NEGATIVE
Protein, UA: NEGATIVE
Specific Gravity, UA: 1.015 (ref 1.005–1.030)
Urobilinogen, Ur: 0.2 mg/dL (ref 0.2–1.0)
pH, UA: 5.5 (ref 5.0–7.5)

## 2015-11-22 LAB — MICROSCOPIC EXAMINATION
Epithelial Cells (non renal): >10 /HPF — AB
WBC, UA: >30 /HPF — AB

## 2015-11-22 MED ORDER — HYDROCHLOROTHIAZIDE 25 MG PO TABS: 25.0000 mg | ORAL_TABLET | Freq: Every day | ORAL | Status: DC

## 2015-11-22 MED ORDER — OSELTAMIVIR PHOSPHATE 75 MG PO CAPS: 75.0000 mg | ORAL_CAPSULE | Freq: Two times a day (BID) | ORAL | Status: DC

## 2015-11-22 NOTE — Addendum Note (Signed)
Addended by: Zannie Cove on: 11/22/2015 04:54 PM   Modules accepted: Orders

## 2015-11-22 NOTE — Progress Notes (Signed)
Subjective:    Patient ID: Kimberly Suarez, female    DOB: 01-23-1941, 75 y.o.   MRN: [id number]  HPI Pt here for follow up and management of chronic medical problems which includes hypertension and hyperlipidemia. She is taking medications regularly.The patient is doing well overall. She does complain of bilateral knee pain. I informed her about orthopedic surgeons that come here once a month and she may arrange to see them. [facility] She does have some problems with swallowing and heartburn and she has a history of GERD with stricture. She says this is no worse than usual. She denies chest pain or shortness of breath. She has not seen any blood in the stool or had any black tarry bowel movements. She has seen the gynecologist recently who checked her breast and did a pelvic exam and she said everything was fine with this but they did find a urinary tract infection and treated her with ampicillin and gave her medication for yeast. She will would like to have her urine rechecked again today since she has completed the medicine. She seems to be doing better with this.     Patient Active Problem List   Diagnosis Date Noted  . Pre-diabetes 06/01/2014  . Renal insufficiency 11/18/2013  . Osteoarthritis of left knee 11/18/2013  . HTN (hypertension) 06/26/2013  . Lymphocytic colitis 10/30/2012  . Hyperlipidemia 10/30/2012  . Unspecified constipation 07/10/2011  . Dysphagia, unspecified(787.20) 07/10/2011  . Diverticulosis of colon (without mention of hemorrhage) 07/10/2011  . Special screening for malignant neoplasms, colon 07/10/2011  . GERD with stricture 07/10/2011   Outpatient Encounter Prescriptions as of 11/22/2015  Medication Sig  . ampicillin (PRINCIPEN) 500 MG capsule Take 1 capsule (500 mg total) by mouth 4 (four) times daily.  Marland Kitchen aspirin 81 MG tablet Take 81 mg by mouth daily.    Marland Kitchen atorvastatin (LIPITOR) 80 MG tablet Take 1 tablet (80 mg total) by mouth daily.   . Calcium Carbonate-Simethicone (TUMS PLUS PO) Take 1 tablet by mouth as needed.  . enalapril (VASOTEC) 10 MG tablet Take 1 tablet (10 mg total) by mouth 2 (two) times daily. (Patient taking differently: Take 10 mg by mouth daily. )  . estradiol (ESTRACE) 0.1 MG/GM vaginal cream Place 1 Applicatorful vaginally at bedtime.  . fluconazole (DIFLUCAN) 150 MG tablet Take 1 tablet (150 mg total) by mouth once.  . Glucosamine-Chondroitin (GLUCOSAMINE CHONDR COMPLEX PO) Take 1 tablet by mouth 2 (two) times daily.   . hydrochlorothiazide (HYDRODIURIL) 25 MG tablet Take 1 tablet (25 mg total) by mouth daily.  . Omega-3 Fatty Acids (FISH OIL) 1000 MG CAPS Take 1 capsule by mouth daily.    No facility-administered encounter medications on file as of 11/22/2015.      Review of Systems  Constitutional: Negative.   HENT: Negative.   Eyes: Negative.   Respiratory: Negative.   Cardiovascular: Negative.   Gastrointestinal: Negative.   Endocrine: Negative.   Genitourinary: Negative.        Recent uti  Musculoskeletal: Negative.   Skin: Negative.   Allergic/Immunologic: Negative.   Neurological: Negative.   Hematological: Negative.   Psychiatric/Behavioral: Negative.        Objective:   Physical Exam  Constitutional: She is oriented to person, place, and time. She appears well-developed and well-nourished. No distress.  HENT:  Head: Normocephalic and atraumatic.  Right Ear: External ear normal.  Left Ear: External ear normal.  Mouth/Throat: Oropharynx is clear and moist. No oropharyngeal  exudate.  Nasal congestion and turbinate swelling bilaterally, throat was clear.  Eyes: Conjunctivae and EOM are normal. Pupils are equal, round, and reactive to light. Right eye exhibits no discharge. Left eye exhibits no discharge. No scleral icterus.  Neck: Normal range of motion. Neck supple. No thyromegaly present.  Cardiovascular: Normal rate, regular rhythm, normal heart sounds and intact distal pulses.    No murmur heard. The heart was regular at 72/m  Pulmonary/Chest: Effort normal and breath sounds normal. No respiratory distress. She has no wheezes. She has no rales. She exhibits no tenderness.  clear anteriorly and posteriorly  Abdominal: Soft. Bowel sounds are normal. She exhibits no mass. There is no tenderness. There is no rebound and no guarding.  No abdominal tenderness masses or bruits  Musculoskeletal: Normal range of motion. She exhibits tenderness. She exhibits no edema.  Some stiffness in both knees with mobility  Lymphadenopathy:    She has no cervical adenopathy.  Neurological: She is alert and oriented to person, place, and time. She has normal reflexes. No cranial nerve deficit.  Skin: Skin is warm and dry. No rash noted.  Psychiatric: She has a normal mood and affect. Her behavior is normal. Judgment and thought content normal.  Nursing note and vitals reviewed.  BP 106/64 mmHg  Pulse 66  Temp(Src) 97.8 F (36.6 C) (Oral)  Ht 5' 4"  (1.626 m)  Wt 149 lb (67.586 kg)  BMI 25.56 kg/m2  [facility] reading (PRIMARY) by  Dr.Moore-chest x-ray--results pending                                        Assessment & Plan:  1. Pre-diabetes -Continue with aggressive therapeutic lifestyle changes[facility] and [facility] - BMP8+EGFR - CBC with Differential/Platelet  2. Essential hypertension -Blood pressures at home have been good and the blood pressure here today was good and she will continue with current treatment - BMP8+EGFR - Hepatic function panel - CBC with Differential/Platelet - DG Chest 2 View; Future  3. Hyperlipidemia -Continue with aggressive therapeutic lifestyle changes and atorvastatin - NMR, lipoprofile - CBC with Differential/Platelet - DG Chest 2 View; Future  4. Special screening for malignant neoplasms, colon -Return the FOBT - Fecal occult blood, imunochemical; Future - CBC with Differential/Platelet  5. Vitamin D  deficiency -According to the notes she is not taking any vitamin D specifically and we will address this when the lab work is returned. - VITAMIN D 25 Hydroxy (Vit-D Deficiency, Fractures) - CBC with Differential/Platelet  6. Recent urinary tract infection -Continue to drink plenty of fluids - Urinalysis, Complete  7. Renal insufficiency -Continue to avoid NSAIDs  8. GERD with stricture -Use ranitidine 150 twice daily as needed for breakfast and supper  Meds ordered this encounter  Medications  . hydrochlorothiazide (HYDRODIURIL) 25 MG tablet    Sig: Take 1 tablet (25 mg total) by mouth daily.    Dispense:  90 tablet    Refill:  3  . oseltamivir (TAMIFLU) 75 MG capsule    Sig: Take 1 capsule (75 mg total) by mouth 2 (two) times daily.    Dispense:  10 capsule    Refill:  0   Patient Instructions                       Medicare Annual Wellness Visit  [employer] and the medical providers  at [facility] strive to bring you the best medical care.  In doing so we not only want to address your current medical conditions and concerns but also to detect new conditions early and prevent illness, disease and health-related problems.    Medicare offers a yearly Wellness Visit which allows our clinical staff to assess your need for preventative services including immunizations, lifestyle education, counseling to decrease risk of preventable diseases and screening for fall risk and other medical concerns.    This visit is provided free of charge (no copay) for all Medicare recipients. The clinical pharmacists at [facility] have begun to conduct these Wellness Visits which will also include a thorough review of all your medications.    As you primary medical provider recommend that you make an appointment for your Annual Wellness Visit if you have not done so already this year.  You may set up this appointment before you leave today or you may call  back ([phone]) and schedule an appointment.  Please make sure when you call that you mention that you are scheduling your Annual Wellness Visit with the clinical pharmacist so that the appointment may be made for the proper length of time.     Continue current medications. Continue good therapeutic lifestyle changes which include good diet and exercise. Fall precautions discussed with patient. If an FOBT was given today- please return it to our front desk. If you are over 26 years old - you may need Prevnar 83 or the adult Pneumonia vaccine.  **Flu shots are available--- please call and schedule a FLU-CLINIC appointment**  After your visit with [place] today you will receive a survey in the mail or online from [employer] regarding your care with [place]. Please take a moment to fill this out. Your feedback is very important to [place] as you can help [place] better understand your patient needs as well as improve your experience and satisfaction. WE CARE ABOUT YOU!!!   Consider trying Zantac or ranitidine, the equate brand 150 mg twice daily for heartburn and indigestion Continue to watch diet closely and stay away from fried greasy foods highly spiced foods and caffeine as much as possible Drink plenty of water and stay well hydrated Consider making an appointment with the orthopedic surgeon when they come to visit our office next month about your knees Return the FOBT We will call with results of the lab work chest x-ray and urinalysis when these results become available   Arrie Senate MD

## 2015-11-22 NOTE — Patient Instructions (Addendum)
Medicare Annual Wellness Visit  [facility] and the medical providers at [facility] strive to bring you the best medical care.  In doing so we not only want to address your current medical conditions and concerns but also to detect new conditions early and prevent illness, disease and health-related problems.    Medicare offers a yearly Wellness Visit which allows our clinical staff to assess your need for preventative services including immunizations, lifestyle education, counseling to decrease risk of preventable diseases and screening for fall risk and other medical concerns.    This visit is provided free of charge (no copay) for all Medicare recipients. The clinical pharmacists at [facility] have begun to conduct these Wellness Visits which will also include a thorough review of all your medications.    As you primary medical provider recommend that you make an appointment for your Annual Wellness Visit if you have not done so already this year.  You may set up this appointment before you leave today or you may call back [phone] and schedule an appointment.  Please make sure when you call that you mention that you are scheduling your Annual Wellness Visit with the clinical pharmacist so that the appointment may be made for the proper length of time.     Continue current medications. Continue good therapeutic lifestyle changes which include good diet and exercise. Fall precautions discussed with patient. If an FOBT was given today- please return it to our front desk. If you are over 63 years old - you may need Prevnar 80 or the adult Pneumonia vaccine.  **Flu shots are available--- please call and schedule a FLU-CLINIC appointment**  After your visit with [place] today you will receive a survey in the mail or online from [employer] regarding your care with [place]. Please take a moment to fill this out. Your feedback is very  important to [place] as you can help [place] better understand your patient needs as well as improve your experience and satisfaction. WE CARE ABOUT YOU!!!   Consider trying Zantac or ranitidine, the equate brand 150 mg twice daily for heartburn and indigestion Continue to watch diet closely and stay away from fried greasy foods highly spiced foods and caffeine as much as possible Drink plenty of water and stay well hydrated Consider making an appointment with the orthopedic surgeon when they come to visit our office next month about your knees Return the FOBT We will call with results of the lab work chest x-ray and urinalysis when these results become available

## 2015-11-22 NOTE — Addendum Note (Signed)
Addended by: Zannie Cove on: 11/22/2015 05:02 PM   Modules accepted: Orders

## 2015-11-23 LAB — NMR, LIPOPROFILE
Cholesterol: 238 mg/dL — ABNORMAL HIGH (ref 100–199)
HDL Cholesterol by NMR: 49 mg/dL
HDL Particle Number: 24.2 umol/L — ABNORMAL LOW
LDL Particle Number: 2245 nmol/L — ABNORMAL HIGH
LDL Size: 20.1 nm
LDL-C: 154 mg/dL — ABNORMAL HIGH (ref 0–99)
LP-IR Score: 42
Small LDL Particle Number: 1430 nmol/L — ABNORMAL HIGH
Triglycerides by NMR: 176 mg/dL — ABNORMAL HIGH (ref 0–149)

## 2015-11-23 LAB — BMP8+EGFR
BUN/Creatinine Ratio: 15 (ref 11–26)
BUN: 20 mg/dL (ref 8–27)
CO2: 26 mmol/L (ref 18–29)
Calcium: 9.7 mg/dL (ref 8.7–10.3)
Chloride: 98 mmol/L (ref 96–106)
Creatinine, Ser: 1.3 mg/dL — ABNORMAL HIGH (ref 0.57–1.00)
GFR calc Af Amer: 47 mL/min/{1.73_m2} — ABNORMAL LOW
GFR calc non Af Amer: 41 mL/min/{1.73_m2} — ABNORMAL LOW
Glucose: 97 mg/dL (ref 65–99)
Potassium: 4 mmol/L (ref 3.5–5.2)
Sodium: 143 mmol/L (ref 134–144)

## 2015-11-23 LAB — CBC WITH DIFFERENTIAL/PLATELET
Basophils Absolute: 0 10*3/uL (ref 0.0–0.2)
Basos: 1 %
EOS (ABSOLUTE): 0.3 10*3/uL (ref 0.0–0.4)
Eos: 5 %
Hematocrit: 39.4 % (ref 34.0–46.6)
Hemoglobin: 13 g/dL (ref 11.1–15.9)
Immature Grans (Abs): 0 10*3/uL (ref 0.0–0.1)
Immature Granulocytes: 0 %
Lymphocytes Absolute: 1.9 10*3/uL (ref 0.7–3.1)
Lymphs: 40 %
MCH: 29.7 pg (ref 26.6–33.0)
MCHC: 33 g/dL (ref 31.5–35.7)
MCV: 90 fL (ref 79–97)
Monocytes Absolute: 0.5 10*3/uL (ref 0.1–0.9)
Monocytes: 10 %
Neutrophils Absolute: 2.1 10*3/uL (ref 1.4–7.0)
Neutrophils: 44 %
Platelets: 199 10*3/uL (ref 150–379)
RBC: 4.37 x10E6/uL (ref 3.77–5.28)
RDW: 14.4 % (ref 12.3–15.4)
WBC: 4.8 10*3/uL (ref 3.4–10.8)

## 2015-11-23 LAB — HEPATIC FUNCTION PANEL
ALT: 16 [IU]/L (ref 0–32)
AST: 23 [IU]/L (ref 0–40)
Albumin: 4.2 g/dL (ref 3.5–4.8)
Alkaline Phosphatase: 39 [IU]/L (ref 39–117)
Bilirubin Total: 0.3 mg/dL (ref 0.0–1.2)
Bilirubin, Direct: 0.08 mg/dL (ref 0.00–0.40)
Total Protein: 6.9 g/dL (ref 6.0–8.5)

## 2015-11-23 LAB — URINE CULTURE: Organism ID, Bacteria: NO GROWTH

## 2015-11-23 LAB — VITAMIN D 25 HYDROXY (VIT D DEFICIENCY, FRACTURES): Vit D, 25-Hydroxy: 40.7 ng/mL (ref 30.0–100.0)

## 2015-11-24 ENCOUNTER — Telehealth: Payer: Self-pay | Admitting: Family Medicine

## 2015-11-24 NOTE — Telephone Encounter (Signed)
Pt aware.

## 2015-11-25 ENCOUNTER — Other Ambulatory Visit: Payer: Self-pay | Admitting: Nurse Practitioner

## 2015-11-25 ENCOUNTER — Encounter: Payer: Self-pay | Admitting: *Deleted

## 2015-11-25 DIAGNOSIS — Z1231 Encounter for screening mammogram for malignant neoplasm of breast: Secondary | ICD-10-CM

## 2015-12-16 ENCOUNTER — Ambulatory Visit (INDEPENDENT_AMBULATORY_CARE_PROVIDER_SITE_OTHER): Payer: Medicare Other

## 2015-12-16 ENCOUNTER — Other Ambulatory Visit: Payer: Medicare Other

## 2015-12-16 ENCOUNTER — Other Ambulatory Visit: Payer: Self-pay | Admitting: Orthopedic Surgery

## 2015-12-16 DIAGNOSIS — Z1211 Encounter for screening for malignant neoplasm of colon: Secondary | ICD-10-CM

## 2015-12-16 DIAGNOSIS — M25561 Pain in right knee: Secondary | ICD-10-CM | POA: Diagnosis not present

## 2015-12-16 DIAGNOSIS — R52 Pain, unspecified: Secondary | ICD-10-CM

## 2015-12-16 DIAGNOSIS — M25562 Pain in left knee: Secondary | ICD-10-CM

## 2015-12-16 DIAGNOSIS — M170 Bilateral primary osteoarthritis of knee: Secondary | ICD-10-CM | POA: Diagnosis not present

## 2015-12-17 LAB — FECAL OCCULT BLOOD, IMMUNOCHEMICAL: Fecal Occult Bld: NEGATIVE

## 2015-12-20 ENCOUNTER — Encounter: Payer: Self-pay | Admitting: Physical Therapy

## 2015-12-20 ENCOUNTER — Ambulatory Visit: Payer: Medicare Other | Attending: Orthopedic Surgery | Admitting: Physical Therapy

## 2015-12-20 DIAGNOSIS — M6281 Muscle weakness (generalized): Secondary | ICD-10-CM | POA: Diagnosis present

## 2015-12-20 NOTE — Therapy (Addendum)
[facility] [place], [place], [address] Phone: [phone]   Fax:  [phone]  Physical Therapy Evaluation  Patient Details  Name: Kimberly Suarez MRN: [id number] Date of Birth: 28-Nov-1940 Referring Provider: Wyvonna Plum MD  Encounter Date: 12/20/2015      PT End of Session - 12/20/15 0906    Visit Number 1   Number of Visits 1   PT Start Time 0906   PT Stop Time 0941   PT Time Calculation (min) 35 min   Activity Tolerance Patient tolerated treatment well   Behavior During Therapy [facility] for tasks assessed/performed      Past Medical History  Diagnosis Date  . GERD (gastroesophageal reflux disease)   . Hiatal hernia   . Diverticulosis   . Hyperlipemia   . Lymphocytic colitis 2007  . Uterine prolapse     Pessary  . ASCUS (atypical squamous cells of undetermined significance) on Pap smear 2011x2    Negative high-risk HPV, normal Pap smears 2012/2013  . Hypertension   . Cataract   . IBS (irritable bowel syndrome)     Past Surgical History  Procedure Laterality Date  . Cataract extraction w/ intraocular lens implant Bilateral     There were no vitals filed for this visit.       Subjective Assessment - 12/20/15 0906    Subjective Patient c/o soreness and stiffness in bil knees mainly with sit to stand.   Pertinent History R knee scope 2005, multiple shots   Patient Stated Goals decrease soreness and stiffness   Currently in Pain? No/denies            [facility] PT Assessment - 12/20/15 0001    Assessment   Medical Diagnosis Bil OA of knees   Referring Provider Wyvonna Plum MD   Onset Date/Surgical Date 08/28/01   Next MD Visit no f/u   Precautions   Precautions None   Balance Screen   Has the patient fallen in the past 6 months No   Has the patient had a decrease in activity level because of a fear of falling?  No   Is the patient reluctant to leave their home because of a fear of falling?  No    Home Environment   Living Environment Private residence   Living Arrangements Spouse/significant other   Type of [place] Multi-level;Able to live on main level with bedroom/bathroom  has 2-3 steps into den; no railing   Prior Function   Level of Independence Independent   Observation/Other Assessments   Focus on Therapeutic Outcomes (FOTO)  Clinical judgement   ROM / Strength   AROM / PROM / Strength Strength   Strength   Overall Strength Comments B knee flex 4/5, ext 5/5; ankle DF 5/5, hip flex 4/5, ABD/ADD 5/5 in sitting, ext 4+/5   Standardized Balance Assessment   Standardized Balance Assessment Five Times Sit to Stand   Five times sit to stand comments  22 sec  no hands                   [facility] Adult PT Treatment/Exercise - 12/20/15 0001    Exercises   Exercises Knee/Hip   Knee/Hip Exercises: Standing   Lateral Step Up Both;10 reps;Hand Hold: 2;Step Height: 6"   Forward Step Up Both;10 reps;Hand Hold: 2;Step Height: 6"   Knee/Hip Exercises: Supine   Bridges Strengthening;Both;10 reps  PT Education - 12/20/15 0950    Education provided Yes   Education Details HEP   Person(s) Educated Patient   Methods Explanation;Demonstration;Handout   Comprehension Verbalized understanding;Returned demonstration          PT Short Term Goals - 12/20/15 0951    PT SHORT TERM GOAL #1   Title I with HEP   Time 1   Period Days   Status Achieved                  Plan - 12/20/15 0954    Clinical Impression Statement Patient presents with c/o bil knee stiffness and weakness. She works out at [employer] and is generally very strong for her age. She has some functional weakness with stairs and sit to stand transfers and will benefit from some added functional exercises.   Rehab Potential Excellent   PT Frequency One time visit   PT Treatment/Interventions Therapeutic exercise   PT Next Visit Plan see d/c summary   PT Home  Exercise Plan bridging, sit to stand, forward and lateral step ups   Consulted and Agree with Plan of Care Patient      Patient will benefit from skilled therapeutic intervention in order to improve the following deficits and impairments:  Decreased strength  Visit Diagnosis: Muscle weakness (generalized) - Plan: PT plan of care cert/re-cert, CANCELED: PT plan of care cert/re-cert      G-Codes - 81/01/75 0952    Functional Assessment Tool Used Clinical Judgement   Functional Limitation Mobility: Walking and moving around   Mobility: Walking and Moving Around Current Status [phone]) At least 1 percent but less than 20 percent impaired, limited or restricted   Mobility: Walking and Moving Around Goal Status [phone]) At least 1 percent but less than 20 percent impaired, limited or restricted   Mobility: Walking and Moving Around Discharge Status [phone]) At least 1 percent but less than 20 percent impaired, limited or restricted       Problem List Patient Active Problem List   Diagnosis Date Noted  . Pre-diabetes 06/01/2014  . Renal insufficiency 11/18/2013  . Osteoarthritis of left knee 11/18/2013  . HTN (hypertension) 06/26/2013  . Lymphocytic colitis 10/30/2012  . Hyperlipidemia 10/30/2012  . Unspecified constipation 07/10/2011  . Dysphagia, unspecified(787.20) 07/10/2011  . Diverticulosis of colon (without mention of hemorrhage) 07/10/2011  . Special screening for malignant neoplasms, colon 07/10/2011  . GERD with stricture 07/10/2011    Madelyn Flavors PT  12/20/2015, 9:59 AM  [facility] [place], [place], [address] Phone: [phone]   Fax:  [phone]  Name: Kimberly Suarez MRN: [id number] Date of Birth: 23-Feb-1941  PHYSICAL THERAPY DISCHARGE SUMMARY  Visits from Start of Care: 1.  Current functional level related to goals / functional outcomes: See above.   Remaining deficits: Eval only.   Education /  Equipment:  Plan: Patient agrees to discharge.  Patient goals were met. Patient is being discharged due to                                                     ?????         Mali Applegate MPT

## 2016-02-04 ENCOUNTER — Other Ambulatory Visit: Payer: Self-pay | Admitting: Physician Assistant

## 2016-02-04 DIAGNOSIS — L570 Actinic keratosis: Secondary | ICD-10-CM | POA: Diagnosis not present

## 2016-02-04 DIAGNOSIS — L821 Other seborrheic keratosis: Secondary | ICD-10-CM | POA: Diagnosis not present

## 2016-02-04 DIAGNOSIS — D485 Neoplasm of uncertain behavior of skin: Secondary | ICD-10-CM | POA: Diagnosis not present

## 2016-02-04 DIAGNOSIS — L820 Inflamed seborrheic keratosis: Secondary | ICD-10-CM | POA: Diagnosis not present

## 2016-03-09 DIAGNOSIS — L570 Actinic keratosis: Secondary | ICD-10-CM | POA: Diagnosis not present

## 2016-03-20 ENCOUNTER — Ambulatory Visit (INDEPENDENT_AMBULATORY_CARE_PROVIDER_SITE_OTHER): Payer: Medicare Other | Admitting: Family Medicine

## 2016-03-20 ENCOUNTER — Encounter: Payer: Self-pay | Admitting: Family Medicine

## 2016-03-20 VITALS — BP 120/77 | HR 81 | Temp 98.3°F | Ht 64.0 in | Wt 149.0 lb

## 2016-03-20 DIAGNOSIS — K047 Periapical abscess without sinus: Secondary | ICD-10-CM

## 2016-03-20 MED ORDER — AMOXICILLIN-POT CLAVULANATE 875-125 MG PO TABS: 1.0000 | ORAL_TABLET | Freq: Two times a day (BID) | ORAL | 0 refills | Status: DC

## 2016-03-20 NOTE — Progress Notes (Signed)
BP 120/77 (BP Location: Right Arm, Patient Position: Sitting, Cuff Size: Normal)   Pulse 81   Temp 98.3 F (36.8 C) (Oral)   Ht 5\' 4"  (1.626 m)   Wt 149 lb (67.6 kg)   BMI 25.58 kg/m    Subjective:    Patient ID: Kimberly Suarez, female    DOB: 06/16/1941, 75 y.o.   MRN: [id number]  HPI: Kimberly Suarez is a 75 y.o. female presenting on 03/20/2016 for Jaw Pain (pain and swelling in right side of face)   HPI Jaw pain and swelling Patient is coming in with right-sided jaw pain and swelling. This is been going on for about 5 days. 3 days ago she had a temperature of 100.49F but none since. She has been using Orajel and Tylenol to control the pain and the swelling has gone down some but is still present. She does admit that she was diagnosed with an early abscess and the possibility of needing a dental extraction versus root canal at that time. She has never had any swelling like this before. She denies any cough and congestion or runny nose. She denies any sore throat or chills or shortness of breath or wheezing.  Relevant past medical, surgical, family and social history reviewed and updated as indicated. Interim medical history since our last visit reviewed. Allergies and medications reviewed and updated.  Review of Systems  Constitutional: Negative for chills and fever.  HENT: Negative for congestion, ear discharge and ear pain.   Eyes: Negative for redness and visual disturbance.  Respiratory: Negative for chest tightness and shortness of breath.   Cardiovascular: Negative for chest pain and leg swelling.  Genitourinary: Negative for difficulty urinating and dysuria.  Musculoskeletal: Negative for back pain and gait problem.  Skin: Negative for color change and rash.       Jaw pain and swelling  Neurological: Negative for light-headedness and headaches.  Psychiatric/Behavioral: Negative for agitation and behavioral problems.  All other systems reviewed and are  negative.   Per HPI unless specifically indicated above     Medication List       Accurate as of 03/20/16 11:12 AM. Always use your most recent med list.          amoxicillin-clavulanate 875-125 MG tablet Commonly known as:  AUGMENTIN Take 1 tablet by mouth 2 (two) times daily.   aspirin 81 MG tablet Take 81 mg by mouth daily.   atorvastatin 80 MG tablet Commonly known as:  LIPITOR Take 1 tablet (80 mg total) by mouth daily.   enalapril 10 MG tablet Commonly known as:  VASOTEC Take 1 tablet (10 mg total) by mouth 2 (two) times daily.   estradiol 0.1 MG/GM vaginal cream Commonly known as:  ESTRACE Place 1 Applicatorful vaginally at bedtime.   Fish Oil 1000 MG Caps Take 1 capsule by mouth daily.   GLUCOSAMINE CHONDR COMPLEX PO Take 1 tablet by mouth 2 (two) times daily.   hydrochlorothiazide 25 MG tablet Commonly known as:  HYDRODIURIL Take 1 tablet (25 mg total) by mouth daily.   TUMS PLUS PO Take 1 tablet by mouth as needed.          Objective:    BP 120/77 (BP Location: Right Arm, Patient Position: Sitting, Cuff Size: Normal)   Pulse 81   Temp 98.3 F (36.8 C) (Oral)   Ht 5\' 4"  (1.626 m)   Wt 149 lb (67.6 kg)   BMI 25.58 kg/m   Wt Readings from Last  3 Encounters:  03/20/16 149 lb (67.6 kg)  11/22/15 149 lb (67.6 kg)  11/11/15 151 lb (68.5 kg)    Physical Exam  Constitutional: She is oriented to person, place, and time. She appears well-developed and well-nourished. No distress.  HENT:  Mouth/Throat: Oral lesions (Swelling of the right side of her jaw and tenderness over her posterior tooth. Concern for dental abscess) present. Abnormal dentition.  Eyes: Conjunctivae and EOM are normal. Pupils are equal, round, and reactive to light.  Cardiovascular: Normal rate, regular rhythm, normal heart sounds and intact distal pulses.   No murmur heard. Pulmonary/Chest: Effort normal and breath sounds normal. No respiratory distress. She has no wheezes.   Musculoskeletal: Normal range of motion. She exhibits no edema or tenderness.  Neurological: She is alert and oriented to person, place, and time. Coordination normal.  Skin: Skin is warm and dry. No rash noted. She is not diaphoretic. No erythema.  Psychiatric: She has a normal mood and affect. Her behavior is normal.  Nursing note and vitals reviewed.   Results for orders placed or performed in visit on 12/16/15  Fecal occult blood, imunochemical  Result Value Ref Range   Fecal Occult Bld Negative Negative      Assessment & Plan:   Problem List Items Addressed This Visit    None    Visit Diagnoses    Dental abscess    -  Primary   Concern for dental abscess, recommended for her to go see her dentist, sent Augmentin   Relevant Medications   amoxicillin-clavulanate (AUGMENTIN) 875-125 MG tablet       Follow up plan: Return if symptoms worsen or fail to improve.  Counseling provided for all of the vaccine components No orders of the defined types were placed in this encounter.   Caryl Pina, MD [facility] 03/20/2016, 11:12 AM

## 2016-04-06 ENCOUNTER — Ambulatory Visit (INDEPENDENT_AMBULATORY_CARE_PROVIDER_SITE_OTHER): Payer: Medicare Other | Admitting: Family Medicine

## 2016-04-06 ENCOUNTER — Encounter: Payer: Self-pay | Admitting: Family Medicine

## 2016-04-06 VITALS — BP 110/67 | HR 68 | Temp 97.2°F | Ht 64.0 in | Wt 150.0 lb

## 2016-04-06 DIAGNOSIS — I10 Essential (primary) hypertension: Secondary | ICD-10-CM | POA: Diagnosis not present

## 2016-04-06 DIAGNOSIS — E785 Hyperlipidemia, unspecified: Secondary | ICD-10-CM | POA: Diagnosis not present

## 2016-04-06 DIAGNOSIS — K219 Gastro-esophageal reflux disease without esophagitis: Secondary | ICD-10-CM | POA: Diagnosis not present

## 2016-04-06 DIAGNOSIS — R7303 Prediabetes: Secondary | ICD-10-CM | POA: Diagnosis not present

## 2016-04-06 DIAGNOSIS — K222 Esophageal obstruction: Secondary | ICD-10-CM | POA: Diagnosis not present

## 2016-04-06 DIAGNOSIS — E559 Vitamin D deficiency, unspecified: Secondary | ICD-10-CM | POA: Diagnosis not present

## 2016-04-06 LAB — LIPID PANEL
HDL: 44 mg/dL (ref 35–70)
LDL Cholesterol: 77 mg/dL

## 2016-04-06 MED ORDER — ENALAPRIL MALEATE 10 MG PO TABS: 10.0000 mg | ORAL_TABLET | Freq: Two times a day (BID) | ORAL | 3 refills | Status: DC

## 2016-04-06 MED ORDER — ATORVASTATIN CALCIUM 80 MG PO TABS: 80.0000 mg | ORAL_TABLET | Freq: Every day | ORAL | 3 refills | Status: DC

## 2016-04-06 NOTE — Progress Notes (Signed)
Subjective:    Patient ID: Kimberly Suarez, female    DOB: [birth year], 75 y.o.   MRN: [id number]  HPI Pt here for follow up and management of chronic medical problems which includes hyperlipidemia. She is taking medications regularly.The patient is doing well overall. She only has one complaint today and she is concerned about a white spot in her throat. She will get lab work today and is requesting refills on some of her medications. Patient is pleasant and doing well. She denies any chest pain pressure palpitations or tightness. She has no shortness of breath. She has no trouble with swallowing and does have occasional heartburn because of her hiatal hernia. She denies any nausea vomiting diarrhea or blood in the stool. She has not had any black tarry bowel movements. She is passing her water without problems. She is up-to-date on her eye exams.     Patient Active Problem List   Diagnosis Date Noted  . Pre-diabetes 06/01/2014  . Renal insufficiency 11/18/2013  . Osteoarthritis of left knee 11/18/2013  . HTN (hypertension) 06/26/2013  . Lymphocytic colitis 10/30/2012  . Hyperlipidemia 10/30/2012  . Unspecified constipation 07/10/2011  . Dysphagia, unspecified(787.20) 07/10/2011  . Diverticulosis of colon (without mention of hemorrhage) 07/10/2011  . Special screening for malignant neoplasms, colon 07/10/2011  . GERD with stricture 07/10/2011   Outpatient Encounter Prescriptions as of 04/06/2016  Medication Sig  . aspirin 81 MG tablet Take 81 mg by mouth daily.    Marland Kitchen atorvastatin (LIPITOR) 80 MG tablet Take 1 tablet (80 mg total) by mouth daily.  . Calcium Carbonate-Simethicone (TUMS PLUS PO) Take 1 tablet by mouth as needed.  . enalapril (VASOTEC) 10 MG tablet Take 1 tablet (10 mg total) by mouth 2 (two) times daily. (Patient taking differently: Take 10 mg by mouth daily. )  . estradiol (ESTRACE) 0.1 MG/GM vaginal cream Place 1 Applicatorful vaginally at bedtime.  .  Glucosamine-Chondroitin (GLUCOSAMINE CHONDR COMPLEX PO) Take 1 tablet by mouth 2 (two) times daily.   . hydrochlorothiazide (HYDRODIURIL) 25 MG tablet Take 1 tablet (25 mg total) by mouth daily.  . Omega-3 Fatty Acids (FISH OIL) 1000 MG CAPS Take 1 capsule by mouth daily.   . [DISCONTINUED] amoxicillin-clavulanate (AUGMENTIN) 875-125 MG tablet Take 1 tablet by mouth 2 (two) times daily.   No facility-administered encounter medications on file as of 04/06/2016.       Review of Systems  Constitutional: Negative.   HENT: Negative.        White spot in throat  Eyes: Negative.   Respiratory: Negative.   Cardiovascular: Negative.   Gastrointestinal: Negative.   Endocrine: Negative.   Genitourinary: Negative.   Musculoskeletal: Negative.   Skin: Negative.   Allergic/Immunologic: Negative.   Neurological: Negative.   Hematological: Negative.   Psychiatric/Behavioral: Negative.        Objective:   Physical Exam  Constitutional: She is oriented to person, place, and time. She appears well-developed and well-nourished.  HENT:  Head: Normocephalic and atraumatic.  Right Ear: External ear normal.  Left Ear: External ear normal.  Nose: Nose normal.  Mouth/Throat: Oropharynx is clear and moist.  No abnormalities observed in the mouth after viewing where the patient said the abnormality was located.  Eyes: Conjunctivae and EOM are normal. Pupils are equal, round, and reactive to light. Right eye exhibits no discharge. Left eye exhibits no discharge. No scleral icterus.  Neck: Normal range of motion. Neck supple. No thyromegaly present.  No bruits thyromegaly or anterior cervical  adenopathy  Cardiovascular: Normal rate, regular rhythm, normal heart sounds and intact distal pulses.   No murmur heard. The heart has a regular rate and rhythm at 60/m  Pulmonary/Chest: Effort normal and breath sounds normal. No respiratory distress. She has no wheezes. She has no rales.  Clear anteriorly and  posteriorly  Abdominal: Soft. Bowel sounds are normal. She exhibits no mass. There is no tenderness. There is no rebound and no guarding.  Slight periUmbilical tenderness but the patient does have a lot of gas. Otherwise no masses or organ enlargement or inguinal adenopathy  Musculoskeletal: Normal range of motion. She exhibits no edema.  Lymphadenopathy:    She has no cervical adenopathy.  Neurological: She is alert and oriented to person, place, and time. She has normal reflexes. No cranial nerve deficit.  Skin: Skin is warm and dry. No rash noted.  Psychiatric: She has a normal mood and affect. Her behavior is normal. Judgment and thought content normal.  Nursing note and vitals reviewed.  BP 110/67 (BP Location: Left Arm)   Pulse 68   Temp 97.2 F (36.2 C) (Oral)   Ht 5' 4"  (1.626 m)   Wt 150 lb (68 kg)   BMI 25.75 kg/m         Assessment & Plan:  1. Hyperlipidemia -Continue current treatment and aggressive therapeutic lifestyle changes pending results of lab work - CBC with Differential/Platelet - NMR, lipoprofile  2. Essential hypertension -The blood pressure is good today and she will continue with current treatment - BMP8+EGFR - CBC with Differential/Platelet - Hepatic function panel  3. Vitamin D deficiency -Continue vitamin D replacement pending results of lab work - CBC with Differential/Platelet - VITAMIN D 25 Hydroxy (Vit-D Deficiency, Fractures)  4. GERD with stricture -Watch diet closely and avoid caffeine and highly spiced foods - CBC with Differential/Platelet - Hepatic function panel  5. Prediabetes -Continue to follow diet closely and exercise as much as possible - CBC with Differential/Platelet  Meds ordered this encounter  Medications  . enalapril (VASOTEC) 10 MG tablet    Sig: Take 1 tablet (10 mg total) by mouth 2 (two) times daily.    Dispense:  180 tablet    Refill:  3  . atorvastatin (LIPITOR) 80 MG tablet    Sig: Take 1 tablet (80 mg  total) by mouth daily.    Dispense:  90 tablet    Refill:  3   Patient Instructions                       Medicare Annual Wellness Visit  [facility] and the medical providers at [facility] strive to bring you the best medical care.  In doing so we not only want to address your current medical conditions and concerns but also to detect new conditions early and prevent illness, disease and health-related problems.    Medicare offers a yearly Wellness Visit which allows our clinical staff to assess your need for preventative services including immunizations, lifestyle education, counseling to decrease risk of preventable diseases and screening for fall risk and other medical concerns.    This visit is provided free of charge (no copay) for all Medicare recipients. The clinical pharmacists at [facility] have begun to conduct these Wellness Visits which will also include a thorough review of all your medications.    As you primary medical provider recommend that you make an appointment for your Annual Wellness Visit if you have  not done so already this year.  You may set up this appointment before you leave today or you may call back [phone] and schedule an appointment.  Please make sure when you call that you mention that you are scheduling your Annual Wellness Visit with the clinical pharmacist so that the appointment may be made for the proper length of time.     Continue current medications. Continue good therapeutic lifestyle changes which include good diet and exercise. Fall precautions discussed with patient. If an FOBT was given today- please return it to our front desk. If you are over 65 years old - you may need Prevnar 50 or the adult Pneumonia vaccine.  **Flu shots are available--- please call and schedule a FLU-CLINIC appointment**  After your visit with [place] today you will receive a survey in the mail or online from [employer]  regarding your care with [place]. Please take a moment to fill this out. Your feedback is very important to [place] as you can help [place] better understand your patient needs as well as improve your experience and satisfaction. WE CARE ABOUT YOU!!!   Continue with current medication We will call you with the results of the lab work as soon as it becomes available If you continue to have problems with your mouth please get back in touch with [place]    Arrie Senate MD

## 2016-04-06 NOTE — Patient Instructions (Addendum)
Medicare Annual Wellness Visit  [facility] and the medical providers at [facility] strive to bring you the best medical care.  In doing so we not only want to address your current medical conditions and concerns but also to detect new conditions early and prevent illness, disease and health-related problems.    Medicare offers a yearly Wellness Visit which allows our clinical staff to assess your need for preventative services including immunizations, lifestyle education, counseling to decrease risk of preventable diseases and screening for fall risk and other medical concerns.    This visit is provided free of charge (no copay) for all Medicare recipients. The clinical pharmacists at [facility] have begun to conduct these Wellness Visits which will also include a thorough review of all your medications.    As you primary medical provider recommend that you make an appointment for your Annual Wellness Visit if you have not done so already this year.  You may set up this appointment before you leave today or you may call back [phone] and schedule an appointment.  Please make sure when you call that you mention that you are scheduling your Annual Wellness Visit with the clinical pharmacist so that the appointment may be made for the proper length of time.     Continue current medications. Continue good therapeutic lifestyle changes which include good diet and exercise. Fall precautions discussed with patient. If an FOBT was given today- please return it to our front desk. If you are over 49 years old - you may need Prevnar 75 or the adult Pneumonia vaccine.  **Flu shots are available--- please call and schedule a FLU-CLINIC appointment**  After your visit with [place] today you will receive a survey in the mail or online from [employer] regarding your care with [place]. Please take a moment to fill this out. Your feedback is very  important to [place] as you can help [place] better understand your patient needs as well as improve your experience and satisfaction. WE CARE ABOUT YOU!!!   Continue with current medication We will call you with the results of the lab work as soon as it becomes available If you continue to have problems with your mouth please get back in touch with [place]

## 2016-04-07 ENCOUNTER — Telehealth: Payer: Self-pay | Admitting: *Deleted

## 2016-04-07 LAB — NMR, LIPOPROFILE
Cholesterol: 153 mg/dL (ref 100–199)
HDL Cholesterol by NMR: 44 mg/dL
HDL Particle Number: 28.7 umol/L — ABNORMAL LOW
LDL Particle Number: 1034 nmol/L — ABNORMAL HIGH
LDL Size: 20.1 nm
LDL-C: 77 mg/dL (ref 0–99)
LP-IR Score: 52 — ABNORMAL HIGH
Small LDL Particle Number: 735 nmol/L — ABNORMAL HIGH
Triglycerides by NMR: 161 mg/dL — ABNORMAL HIGH (ref 0–149)

## 2016-04-07 LAB — BMP8+EGFR
BUN/Creatinine Ratio: 16 (ref 12–28)
BUN: 20 mg/dL (ref 8–27)
CO2: 29 mmol/L (ref 18–29)
Calcium: 9.8 mg/dL (ref 8.7–10.3)
Chloride: 99 mmol/L (ref 96–106)
Creatinine, Ser: 1.24 mg/dL — ABNORMAL HIGH (ref 0.57–1.00)
GFR calc Af Amer: 49 mL/min/{1.73_m2} — ABNORMAL LOW
GFR calc non Af Amer: 43 mL/min/{1.73_m2} — ABNORMAL LOW
Glucose: 103 mg/dL — ABNORMAL HIGH (ref 65–99)
Potassium: 4.2 mmol/L (ref 3.5–5.2)
Sodium: 143 mmol/L (ref 134–144)

## 2016-04-07 LAB — VITAMIN D 25 HYDROXY (VIT D DEFICIENCY, FRACTURES): Vit D, 25-Hydroxy: 46.1 ng/mL (ref 30.0–100.0)

## 2016-04-07 LAB — HEPATIC FUNCTION PANEL
ALT: 15 [IU]/L (ref 0–32)
AST: 22 [IU]/L (ref 0–40)
Albumin: 3.9 g/dL (ref 3.5–4.8)
Alkaline Phosphatase: 47 [IU]/L (ref 39–117)
Bilirubin Total: 0.3 mg/dL (ref 0.0–1.2)
Bilirubin, Direct: 0.09 mg/dL (ref 0.00–0.40)
Total Protein: 6.9 g/dL (ref 6.0–8.5)

## 2016-04-07 LAB — CBC WITH DIFFERENTIAL/PLATELET
Basophils Absolute: 0 10*3/uL (ref 0.0–0.2)
Basos: 1 %
EOS (ABSOLUTE): 0.3 10*3/uL (ref 0.0–0.4)
Eos: 6 %
Hematocrit: 38.9 % (ref 34.0–46.6)
Hemoglobin: 13 g/dL (ref 11.1–15.9)
Immature Grans (Abs): 0 10*3/uL (ref 0.0–0.1)
Immature Granulocytes: 0 %
Lymphocytes Absolute: 1.8 10*3/uL (ref 0.7–3.1)
Lymphs: 35 %
MCH: 29.8 pg (ref 26.6–33.0)
MCHC: 33.4 g/dL (ref 31.5–35.7)
MCV: 89 fL (ref 79–97)
Monocytes Absolute: 0.4 10*3/uL (ref 0.1–0.9)
Monocytes: 9 %
Neutrophils Absolute: 2.4 10*3/uL (ref 1.4–7.0)
Neutrophils: 49 %
Platelets: 212 10*3/uL (ref 150–379)
RBC: 4.36 x10E6/uL (ref 3.77–5.28)
RDW: 13.9 % (ref 12.3–15.4)
WBC: 4.9 10*3/uL (ref 3.4–10.8)

## 2016-04-07 NOTE — Telephone Encounter (Signed)
-----   Message from Chipper Herb, MD sent at 04/07/2016  6:56 AM EDT ----- The blood sugar slightly elevated at 103. The creatinine, the most important kidney function test remains slightly elevated but not incise it was 4 months ago. We will continue to monitor this. Continue to avoid all NSAIDs like ibuprofen and Aleve. The electrolytes including potassium are good. The CBC has a normal white blood cell count. The hemoglobin is good at 13.0 and the platelet count is adequate. All liver function tests are within normal limits Cholesterol numbers with advanced lipid testing are much improved from the previous visit and are still only slightly elevated at 1034. The LDL C is good at 77. Triglycerides are slightly elevated at 161 and the good cholesterol or the HDL particle number remains low but improved from previously. Patient should continue with her atorvastatin and with as aggressive therapeutic lifestyle changes as possible which include diet and exercise The vitamin D level is good at 46.1 and she should continue with current treatment

## 2016-05-18 ENCOUNTER — Telehealth: Payer: Self-pay | Admitting: Internal Medicine

## 2016-05-18 MED ORDER — BUDESONIDE 3 MG PO CPEP: 9.0000 mg | ORAL_CAPSULE | Freq: Every day | ORAL | 0 refills | Status: DC

## 2016-05-18 NOTE — Telephone Encounter (Signed)
Spoke with pt and she is aware. Script sent to pharmacy. 

## 2016-05-18 NOTE — Telephone Encounter (Signed)
Pt with history of microscopic colitis. States she has been having diarrhea for about a week. States she has a few diarrhea stools in the am and if she takes an Imodium she is good until the afternoon and then she will take another Imodium. Per last OV note it mentions that if the diarrhea returned may try pt on Budesonide. Please advise.

## 2016-05-18 NOTE — Telephone Encounter (Signed)
[place] for trial of budesonide (Entocort) 9 mg daily x 4 weeks, then 6 mg daily x 4 weeks, then 3 mg daily x 4 weeks Call if not helping or with other questions or concerns OV next available for continuity

## 2016-05-19 ENCOUNTER — Telehealth: Payer: Self-pay | Admitting: Internal Medicine

## 2016-05-19 NOTE — Telephone Encounter (Signed)
Budesonide can be cost prohibitive and here are the options: 1. Could have compounded budesonide from compounding pharmacy in [place] dosed 10 mg 4-6 weeks then 5 mg 4-6 weeks and then off. This will be about $100 per month 2. Could try Imodium 2-3 times daily to control loose stools, which has helped her in the past 3. Could try bismuth subsalicylate three [id number] mg tablets 3 times daily (this will turn stool black)

## 2016-05-19 NOTE — Telephone Encounter (Signed)
I have given patient Dr Kimberly Suarez recommendations and she states that she tried imodium yesterday and did well until this morning. She would like to stick with imodium 2-3 times daily right now and would like to see Dr Hilarie Fredrickson in the office to discuss "some testing on my belly." Patient is scheduled to see Dr Hilarie Fredrickson in follow up (1 year f/u) on 08/10/16. She did not want sooner appointment. I asked that she contact us should she develop any abdominal pain, worsening diarrhea not helped with imodium or any additional symptoms. She verbalizes understanding.

## 2016-06-01 ENCOUNTER — Ambulatory Visit (INDEPENDENT_AMBULATORY_CARE_PROVIDER_SITE_OTHER): Payer: Medicare Other | Admitting: Pharmacist

## 2016-06-01 ENCOUNTER — Encounter: Payer: Self-pay | Admitting: Pharmacist

## 2016-06-01 VITALS — BP 116/74 | HR 72 | Ht 63.25 in | Wt 150.0 lb

## 2016-06-01 DIAGNOSIS — Z23 Encounter for immunization: Secondary | ICD-10-CM | POA: Diagnosis not present

## 2016-06-01 DIAGNOSIS — Z0000 Encounter for general adult medical examination without abnormal findings: Secondary | ICD-10-CM | POA: Diagnosis not present

## 2016-06-01 NOTE — Patient Instructions (Addendum)
  Kimberly Suarez , Thank you for taking time to come for your Medicare Wellness Visit. I appreciate your ongoing commitment to your health goals. Please review the following plan we discussed and let me know if I can assist you in the future.   These are the goals we discussed: Continue to exercise regularly - goal is 150 minutes this week.    Increase non-starchy vegetables - carrots, green bean, squash, zucchini, tomatoes, onions, peppers, spinach and other green leafy vegetables, cabbage, lettuce, cucumbers, asparagus, okra (not fried), eggplant Limit sugar and processed foods (cakes, cookies, ice cream, crackers and chips) Increase fresh fruit but limit serving sizes 1/2 cup or about the size of tennis or baseball Limit red meat to no more than 1-2 times per week (serving size about the size of your palm) Choose whole grains / lean proteins - whole wheat bread, quinoa, whole grain rice (1/2 cup), fish, chicken, [place] Avoid sugar and calorie containing beverages - soda, sweet tea and juice.  Choose water or unsweetened tea instead.   This is a list of the screening recommended for you and due dates:  Health Maintenance  Topic Date Due  . Flu Shot  Done today  . Pneumonia vaccines (2 of 2 - PPSV23) completed  . Stool Blood Test  12/15/2016  . DEXA scan (bone density measurement)  09/07/2018  . Tetanus Vaccine  05/28/2021  . Colon Cancer Screening  07/09/2021  . Shingles Vaccine  Completed  *Topic was postponed. The date shown is not the original due date.

## 2016-06-01 NOTE — Progress Notes (Signed)
Patient ID: Kimberly Suarez, female   DOB: 1940/12/03, 75 y.o.   MRN: [id number]   Subjective:   Kimberly Suarez is a 75 y.o. female who presents for a Subsequent Medicare Annual Wellness Visit.  Kimberly Suarez is married and lives with her husband in [place], [place]. She is retired.  She initially worked for about 12 years in a [employer] organ[employer] but in [place] quit and worked in her [place].  She is very active and continues to exercise at least twice a week   Current Medications (verified) Outpatient Encounter Prescriptions as of 06/01/2016  Medication Sig  . aspirin 81 MG tablet Take 81 mg by mouth daily.    Marland Kitchen atorvastatin (LIPITOR) 80 MG tablet Take 1 tablet (80 mg total) by mouth daily.  . Calcium Carbonate-Simethicone (TUMS PLUS PO) Take 1 tablet by mouth as needed.  . enalapril (VASOTEC) 10 MG tablet Take 1 tablet (10 mg total) by mouth 2 (two) times daily.  Marland Kitchen estradiol (ESTRACE) 0.1 MG/GM vaginal cream Place 1 Applicatorful vaginally at bedtime.  . Glucosamine-Chondroitin (GLUCOSAMINE CHONDR COMPLEX PO) Take 1 tablet by mouth 2 (two) times daily.   . hydrochlorothiazide (HYDRODIURIL) 25 MG tablet Take 1 tablet (25 mg total) by mouth daily.  . Multiple Vitamins-Minerals (MULTIVITAMIN ADULT PO) Take 1 tablet by mouth daily.  . Omega-3 Fatty Acids (FISH OIL) 1000 MG CAPS Take 1 capsule by mouth daily.   . [DISCONTINUED] budesonide (ENTOCORT EC) 3 MG 24 hr capsule Take 3 capsules (9 mg total) by mouth daily. Take 3 capsules by mouth daily for 4 weeks, then take 2 capsules daily for 4 weeks, then take 1 capsule daily for 4 weeks (Patient not taking: Reported on 06/01/2016)   No facility-administered encounter medications on file as of 06/01/2016.     Allergies (verified) Review of patient's allergies indicates no known allergies.   History: Past Medical History:  Diagnosis Date  . ASCUS (atypical squamous cells of undetermined significance) on  Pap smear 2011x2   Negative high-risk HPV, normal Pap smears 2012/2013  . Cataract   . Diverticulosis   . GERD (gastroesophageal reflux disease)   . Hiatal hernia   . Hyperlipemia   . Hypertension   . IBS (irritable bowel syndrome)   . Lymphocytic colitis 2007  . Uterine prolapse    Pessary   Past Surgical History:  Procedure Laterality Date  . CATARACT EXTRACTION W/ INTRAOCULAR LENS IMPLANT Bilateral    Family History  Problem Relation Age of Onset  . Heart disease Mother   . Diabetes Brother     BKA  . COPD Father   . Asthma Father   . Alzheimer's disease Sister   . Alzheimer's disease Brother   . Multiple sclerosis Sister   . Heart disease Sister   . Diabetes Brother   . Early death Brother     died in war  . Colon cancer Neg Hx   . Kidney disease Neg Hx   . Liver disease Neg Hx    Social History   Occupational History  . Retired     Social History Main Topics  . Smoking status: Never Smoker  . Smokeless tobacco: Never Used  . Alcohol use 0.0 oz/week     Comment: Rare  . Drug use: No  . Sexual activity: Yes    Birth control/ protection: Post-menopausal     Comment: 1st intercourse 15 yo--1 partner    Do you feel safe at  home?  Yes Are there smokers in your home (other than you)? No  Dietary issues and exercise activities: Current Exercise Habits: Structured exercise class, Time (Minutes): 60, Frequency (Times/Week): 2, Weekly Exercise (Minutes/Week): 120, Intensity: Moderate  Current Dietary habits:  Limiting sugar containing drinks.  Since our last visit she has stopped drinking regular juice and is only drinking low sugar juice about 4 ounce a day with her medications. She is also limiting high CHO foods.    Objective:    Today's Vitals   06/01/16 1023  BP: 116/74  Pulse: 72  Weight: 150 lb (68 kg)  Height: 5' 3.25" (1.607 m)  PainSc: 0-No pain   Body mass index is 26.36 kg/m.  Activities of Daily Living In your present state of health, do  you have any difficulty performing the following activities: 06/01/2016 06/09/2015  Hearing? N N  Vision? N N  Difficulty concentrating or making decisions? N N  Walking or climbing stairs? N N  Dressing or bathing? N N  Doing errands, shopping? N N  Preparing Food and eating ? N -  Using the Toilet? N -  In the past six months, have you accidently leaked urine? N -  Do you have problems with loss of bowel control? N -  Managing your Medications? N -  Managing your Finances? N -  Housekeeping or managing your Housekeeping? N -  Some recent data might be hidden     Cardiac Risk Factors include: advanced age (>18men, >7 women);dyslipidemia;hypertension  Depression Screen PHQ 2/9 Scores 06/01/2016 04/06/2016 03/20/2016 11/22/2015  PHQ - 2 Score 0 0 1 0     Fall Risk Fall Risk  06/01/2016 04/06/2016 03/20/2016 11/22/2015 09/14/2015  Falls in the past year? No No No No No    Cognitive Function: MMSE - Mini Mental State Exam 06/01/2016 06/09/2015  Orientation to time 5 5  Orientation to Place 5 5  Registration 3 3  Attention/ Calculation 3 5  Recall 2 2  Language- name 2 objects 2 2  Language- repeat 1 1  Language- follow 3 step command 3 3  Language- read & follow direction 1 1  Write a sentence 1 1  Copy design 1 1  Total score 27 29    Immunizations and Health Maintenance Immunization History  Administered Date(s) Administered  . Influenza, High Dose Seasonal PF 06/28/2015  . Influenza,inj,Quad PF,36+ Mos 07/02/2013, 05/26/2014  . Pneumococcal Conjugate-13 07/02/2013   There are no preventive care reminders to display for this patient.  Patient Care Team: Chipper Herb, MD as PCP - General (Family Medicine) Jerene Bears, MD as Consulting Physician (Gastroenterology) Anastasio Auerbach, MD as Consulting Physician (Gynecology)  Indicate any recent Medical Services you may have received from other than [facility] providers in the past year (date may be approximate).      Assessment:    Annual Wellness Visit    Screening Tests Health Maintenance  Topic Date Due  . INFLUENZA VACCINE  06/06/2016 (Originally 03/28/2016)  . PNA vac Low Risk Adult (2 of 2 - PPSV23) 06/23/2016 (Originally 07/02/2014)  . COLON CANCER SCREENING ANNUAL FOBT  12/15/2016  . DEXA SCAN  09/07/2018  . TETANUS/TDAP  05/28/2021  . COLONOSCOPY  07/09/2021  . ZOSTAVAX  Completed        Plan:   During the course of the visit Luvenia was educated and counseled about the following appropriate screening and preventive services:   Vaccines to include Pneumoccal, Influenza, Td, Zostavax - influenza  vaccince given today in office  Colorectal cancer screening - UTD  Cardiovascular disease screening - lipids UTD and taking statin.  BP is at goal.   Diabetes screening - last A1c was in pre diabetic range.  Recommend recheck with next labs.  Bone Denisty / Osteoporosis Screening - UTD and last DEXA showed normal BMD  Mammogram - due at end of month - patient gets [facility] and will call for appt  PAP - UTD  Glaucoma screening /  Eye Exam - UTD  Nutrition counseling - continue to limit sugar and CHO intake.  Recommedned BMI goal of 25 or less  Advanced Directives - none currently, patient has paperwork at home from last year   Physical Activity - continue to exercise at [facility]   Patient Instructions (the written plan) were given to the patient.   Cherre Robins, PharmD   06/01/2016

## 2016-06-05 ENCOUNTER — Encounter: Payer: Self-pay | Admitting: *Deleted

## 2016-06-12 ENCOUNTER — Telehealth: Payer: Self-pay | Admitting: Internal Medicine

## 2016-06-12 NOTE — Telephone Encounter (Signed)
Left message for pt to call back  °

## 2016-06-14 NOTE — Telephone Encounter (Signed)
Unable to reach pt

## 2016-06-19 ENCOUNTER — Encounter: Payer: Self-pay | Admitting: Gastroenterology

## 2016-06-19 ENCOUNTER — Ambulatory Visit (INDEPENDENT_AMBULATORY_CARE_PROVIDER_SITE_OTHER): Payer: Medicare Other | Admitting: Gastroenterology

## 2016-06-19 ENCOUNTER — Other Ambulatory Visit: Payer: Medicare Other

## 2016-06-19 VITALS — BP 122/74 | HR 76 | Ht 63.0 in | Wt 148.0 lb

## 2016-06-19 DIAGNOSIS — R197 Diarrhea, unspecified: Secondary | ICD-10-CM | POA: Diagnosis not present

## 2016-06-19 DIAGNOSIS — K52839 Microscopic colitis, unspecified: Secondary | ICD-10-CM

## 2016-06-19 NOTE — Progress Notes (Addendum)
     06/19/2016 Kimberly Suarez [id number] October 30, 1940   History of Present Illness:  This is a 75 year old female known to Dr. Hilarie Fredrickson. Has microscopic colitis for which she had been previously treated with some prednisone. We have been trying to get away from treating her with that. Like most microscopic colitis, her symptoms tend to wax and wane. She presents to our office today with complaints of recurrent diarrhea.  She says that it started again about 5 weeks ago.  Then she had 1 week where she didn't have the issue anymore, but then it has been present again for the past 2 weeks, but not as severe. Says that she is going about 3 times per day with a lot of cramping and gurgling in her abdomen. Complains of a lot of gas. Not as much issues with incontinence the past 2 weeks, but initially she had some. No blood in her stools. She had called Dr. Hilarie Fredrickson who recommended budesonide, but she says it is very expensive. Then his  other recommendations were either compounded budesonide from [facility] that would cost approx $100, Imodium when necessary, or bismuth.  These were recommended to the patient, but she still wanted an office visit to discuss. She is wondering if we can check her for C. difficile. She was negative for C. difficile last year at this time when she began to have similar symptoms. Has not been an on any antibiotics and does not have any recent known C. difficile exposure.  Colonoscopy 06/2011 without polyps so repeat recommended in 10 years from that time.   Current Medications, Allergies, Past Medical History, Past Surgical History, Family History and Social History were reviewed in [employer] record.   Physical Exam: BP 122/74   Pulse 76   Ht 5\' 3"  (1.6 m)   Wt 148 lb (67.1 kg)   BMI 26.22 kg/m  General: Well developed white female in no acute distress Head: Normocephalic and atraumatic Eyes:  Sclerae anicteric, conjunctiva pink  Ears: Normal  auditory acuity Lungs: Clear throughout to auscultation Heart: Regular rate and rhythm Abdomen: Soft, non-distended.  BS present.  Mild diffuse TTP.   Musculoskeletal: Symmetrical with no gross deformities  Extremities: No edema  Neurological: Alert oriented x 4, grossly non-focal Psychological:  Alert and cooperative. Normal mood and affect  Assessment and Recommendations: -Diarrhea:  Has long-standing history of microscopic colitis:  Waxes and wanes.  Previously used to be treated with prednisone and we have trying to steer her in other directions away from doing that.  Recently having a flare of her symptoms again.  Dr. Hilarie Fredrickson offered her some options on treatment since Entocort was expensive.  We discussed those options again today.  She would like to continue the Imodium prn for now.  Would also like to be checked for Cdiff for reassurance; will order, although I doubt that this is the case.  Continue probiotic as well.  Will keep follow-up appt with Dr. Hilarie Fredrickson in December.  Addendum: Reviewed and agree with management. Jerene Bears, MD

## 2016-06-19 NOTE — Patient Instructions (Addendum)
Your physician has requested that you go to the basement for the following lab work before leaving today:  C Diff  Use Imodium as needed  Try florastor or culturelle probiotic.    Keep appointment with Dr Hilarie Fredrickson on 08/10/16 at 9:45 am

## 2016-07-21 ENCOUNTER — Encounter: Payer: Self-pay | Admitting: Family Medicine

## 2016-07-21 ENCOUNTER — Ambulatory Visit (INDEPENDENT_AMBULATORY_CARE_PROVIDER_SITE_OTHER): Payer: Medicare Other | Admitting: Family Medicine

## 2016-07-21 VITALS — BP 118/78 | HR 83 | Temp 97.7°F | Ht 63.25 in | Wt 146.4 lb

## 2016-07-21 DIAGNOSIS — K2100 Gastro-esophageal reflux disease with esophagitis, without bleeding: Secondary | ICD-10-CM

## 2016-07-21 DIAGNOSIS — A084 Viral intestinal infection, unspecified: Secondary | ICD-10-CM

## 2016-07-21 MED ORDER — RANITIDINE HCL 150 MG PO TABS: 150.0000 mg | ORAL_TABLET | Freq: Two times a day (BID) | ORAL | 1 refills | Status: DC

## 2016-07-21 MED ORDER — ONDANSETRON 4 MG PO TBDP: 4.0000 mg | ORAL_TABLET | Freq: Three times a day (TID) | ORAL | 0 refills | Status: DC | PRN

## 2016-07-21 NOTE — Progress Notes (Signed)
BP 118/78   Pulse 83   Temp 97.7 F (36.5 C) (Oral)   Ht 5' 3.25" (1.607 m)   Wt 146 lb 6.4 oz (66.4 kg)   BMI 25.73 kg/m    Subjective:    Patient ID: Kimberly Suarez, female    DOB: 1941/04/25, 75 y.o.   MRN: [id number]  HPI: Kimberly Suarez is a 75 y.o. female presenting on 07/21/2016 for nausea and diarrhea   HPI Nausea and diarrhea Patient has been having nausea for the past day and diarrhea more chronically and intermittently. She has a referral to gastroenterology for the diarrhea that's chronic and intermittent but the nausea and indigestion and belching and burping all started yesterday. She says she was not able to eat her Thanksgiving dinner because of the nausea and indigestion and belching and burping and acid. She also has some discomfort in the epigastric region of her stomach. She denies any fevers or chills. She has had some sweats intermittently. She denies vomiting. She denies any blood in her stool. Her stool color has been abnormally green.  Relevant past medical, surgical, family and social history reviewed and updated as indicated. Interim medical history since our last visit reviewed. Allergies and medications reviewed and updated.  Review of Systems  Constitutional: Negative for chills and fever.  HENT: Negative for congestion, ear discharge and ear pain.   Eyes: Negative for redness and visual disturbance.  Respiratory: Negative for chest tightness and shortness of breath.   Cardiovascular: Negative for chest pain and leg swelling.  Gastrointestinal: Positive for abdominal pain, diarrhea and nausea. Negative for abdominal distention, anal bleeding, blood in stool, constipation and vomiting.  Genitourinary: Negative for difficulty urinating, dysuria and frequency.  Musculoskeletal: Negative for back pain and gait problem.  Skin: Negative for rash.  Neurological: Negative for light-headedness and headaches.  Psychiatric/Behavioral: Negative for agitation  and behavioral problems.  All other systems reviewed and are negative.   Per HPI unless specifically indicated above      Objective:    BP 118/78   Pulse 83   Temp 97.7 F (36.5 C) (Oral)   Ht 5' 3.25" (1.607 m)   Wt 146 lb 6.4 oz (66.4 kg)   BMI 25.73 kg/m   Wt Readings from Last 3 Encounters:  07/21/16 146 lb 6.4 oz (66.4 kg)  06/19/16 148 lb (67.1 kg)  06/01/16 150 lb (68 kg)    Physical Exam  Constitutional: She is oriented to person, place, and time. She appears well-developed and well-nourished. No distress.  Eyes: Conjunctivae are normal.  Cardiovascular: Normal rate, regular rhythm, normal heart sounds and intact distal pulses.   No murmur heard. Pulmonary/Chest: Effort normal and breath sounds normal. No respiratory distress. She has no wheezes. She has no rales.  Abdominal: Soft. Bowel sounds are normal. She exhibits no distension. There is tenderness (Epigastric discomfort). There is no rebound and no guarding.  Musculoskeletal: Normal range of motion. She exhibits no edema or tenderness.  Neurological: She is alert and oriented to person, place, and time. Coordination normal.  Skin: Skin is warm and dry. No rash noted. She is not diaphoretic.  Psychiatric: She has a normal mood and affect. Her behavior is normal.  Nursing note and vitals reviewed.     Assessment & Plan:   Problem List Items Addressed This Visit    None    Visit Diagnoses    Viral gastroenteritis    -  Primary   Relevant Medications   ranitidine (  ZANTAC) 150 MG tablet   ondansetron (ZOFRAN ODT) 4 MG disintegrating tablet   Gastroesophageal reflux disease with esophagitis       Relevant Medications   ranitidine (ZANTAC) 150 MG tablet   ondansetron (ZOFRAN ODT) 4 MG disintegrating tablet       Follow up plan: Return if symptoms worsen or fail to improve.  Counseling provided for all of the vaccine components No orders of the defined types were placed in this encounter.   Caryl Pina, MD [facility] 07/21/2016, 12:02 PM

## 2016-07-24 ENCOUNTER — Telehealth: Payer: Self-pay | Admitting: Internal Medicine

## 2016-07-24 NOTE — Telephone Encounter (Signed)
Patient with a flare of diarrhea.  She is willing to try budesonide.  Please advise.  Alonza Bogus, PA note states that other options were offered.  Please advise the next step

## 2016-07-24 NOTE — Telephone Encounter (Signed)
Hx of microscopic colitis Ok to try ileal-release budesonide (not Uceris) 9 mg daily x 4 weeks, 6 mg daily x 4 weeks and 3 mg daily x 4 weeks Call if not helping Imodium can still be used per box instruction as needed with the new treatment regimen

## 2016-07-24 NOTE — Telephone Encounter (Signed)
rx sent to [facility] in [place].  Patient provided the information to the pharmacy.  She wants the full 90 day supply at once.

## 2016-08-03 ENCOUNTER — Telehealth: Payer: Self-pay | Admitting: Internal Medicine

## 2016-08-03 NOTE — Telephone Encounter (Signed)
Pt states she is not having any issues now since taking the budesonide. Pt wants to cancel the appt in December. States she will call back to schedule an appt in March to see Dr. Hilarie Fredrickson. Appt in December cancelled.

## 2016-08-10 ENCOUNTER — Ambulatory Visit: Payer: Medicare Other | Admitting: Internal Medicine

## 2016-09-04 ENCOUNTER — Telehealth: Payer: Self-pay | Admitting: Internal Medicine

## 2016-09-04 NOTE — Telephone Encounter (Signed)
Pt states she is now taking 1 pill of budesonide-5mg  daily. States she is having 2-3 loose stools a day. When she started the one pill a day on 08/25/16 she started having more loose stools. States the stool does not smell as bad but she is tired of her stomach rumbling and tired of multiple stools/day. Please advise.

## 2016-09-04 NOTE — Telephone Encounter (Signed)
Would increase back to 6 mg of budesonide daily. I assume one pill is 3 mg daily (rather than 5 mg) Let me know if increasing the dose back to 6mg  daily does not improve symptoms of microscopic colitis

## 2016-09-05 MED ORDER — NONFORMULARY OR COMPOUNDED ITEM: 0 refills | Status: DC

## 2016-09-05 NOTE — Telephone Encounter (Signed)
Pt was taking 10mg  daily for 4 weeks that she started on 07/24/16, on 08/25/16 she decreased to 5mg  daily for 4 weeks. She is supposed to taper down to 2.5mg  daily for 4 weeks.

## 2016-09-05 NOTE — Telephone Encounter (Signed)
Pt states she is taking a compounded form of the budesonide that is 5mg . Do you still want her to increase to 2 pills which would be 10mg  daily? Please advise.

## 2016-09-05 NOTE — Telephone Encounter (Signed)
Would increase back to 10 mg daily for now x 4 weeks, then try 7.5 mg daily x 4 weeks Call with update if not getting better within 1 week JMP

## 2016-09-05 NOTE — Telephone Encounter (Signed)
Okay, so what doses was she on previously?

## 2016-09-05 NOTE — Telephone Encounter (Signed)
New script sent to [facility] for more Budesonide  5mg  capsules for pt to take to complete new instructions. Pt to call back in a week if no improvement.

## 2016-10-05 ENCOUNTER — Ambulatory Visit: Payer: Medicare Other | Admitting: Family Medicine

## 2016-10-10 ENCOUNTER — Encounter: Payer: Self-pay | Admitting: Pediatrics

## 2016-10-10 ENCOUNTER — Other Ambulatory Visit: Payer: Self-pay | Admitting: *Deleted

## 2016-10-10 ENCOUNTER — Ambulatory Visit: Payer: Medicare Other | Admitting: Family Medicine

## 2016-10-10 ENCOUNTER — Ambulatory Visit (INDEPENDENT_AMBULATORY_CARE_PROVIDER_SITE_OTHER): Payer: Medicare Other | Admitting: Pediatrics

## 2016-10-10 VITALS — BP 113/65 | HR 92 | Temp 99.4°F | Resp 22 | Ht 63.25 in | Wt 147.6 lb

## 2016-10-10 DIAGNOSIS — R6889 Other general symptoms and signs: Secondary | ICD-10-CM

## 2016-10-10 DIAGNOSIS — J069 Acute upper respiratory infection, unspecified: Secondary | ICD-10-CM | POA: Diagnosis not present

## 2016-10-10 LAB — VERITOR FLU A/B WAIVED
Influenza A: NEGATIVE
Influenza B: NEGATIVE

## 2016-10-10 NOTE — Progress Notes (Signed)
  Subjective:   Patient ID: Kimberly Suarez, female    DOB: Dec 13, 1940, 76 y.o.   MRN: [id number] CC: Cough; Chest congestion; and Fevers  HPI: Kimberly Suarez is a 76 y.o. female presenting for Cough; Chest congestion; and Fevers  Started getting sick three days ago Felt light headed the first night, sweat Temp to 99.8 at home Was nauseous Taking some OTC meds No sore throat Appetite down Feeling achey all over No one else sick at home that she knows of Feels like she is wheezing some at night Cough bothering her the most, feeling run down  Relevant past medical, surgical, family and social history reviewed. Allergies and medications reviewed and updated. History  Smoking Status  . Never Smoker  Smokeless Tobacco  . Never Used   ROS: Per HPI   Objective:    BP 113/65   Pulse 92   Temp 99.4 F (37.4 C) (Oral)   Resp (!) 22   Ht 5' 3.25" (1.607 m)   Wt 147 lb 9.6 oz (67 kg)   SpO2 96%   BMI 25.94 kg/m   Wt Readings from Last 3 Encounters:  10/10/16 147 lb 9.6 oz (67 kg)  07/21/16 146 lb 6.4 oz (66.4 kg)  06/19/16 148 lb (67.1 kg)    Gen: NAD, alert, cooperative with exam, NCAT, coughing EYES: EOMI, no conjunctival injection, or no icterus ENT:  TMs pearly gray b/l, OP with mild erythema LYMPH: no cervical LAD CV: NRRR, normal S1/S2, no murmur, distal pulses 2+ b/l Resp: CTABL, no wheezes, normal WOB, no crackles Abd: +BS, soft, NTND. no guarding or organomegaly Ext: No edema, warm Neuro: Alert and oriented MSK: normal muscle bulk  Assessment & Plan:  Kimberly Suarez was seen today for cough, chest congestion and fevers.  Diagnoses and all orders for this visit:  Acute URI Flu test negative Discussed symptomatic care, return precautions  Flu-like symptoms -     Veritor Flu A/B Waived   Follow up plan: Return if symptoms worsen or fail to improve. Assunta Found, MD [facility]

## 2016-10-10 NOTE — Patient Instructions (Signed)
Netipot with distilled water 2-3 times a day to clear out sinuses Or Normal saline nasal spray Flonase steroid nasal spray Antihistamine daily such as cetirizine Lots of fluids  

## 2016-10-28 ENCOUNTER — Other Ambulatory Visit: Payer: Self-pay | Admitting: Family Medicine

## 2016-10-30 ENCOUNTER — Other Ambulatory Visit (INDEPENDENT_AMBULATORY_CARE_PROVIDER_SITE_OTHER): Payer: Medicare Other

## 2016-10-30 DIAGNOSIS — I10 Essential (primary) hypertension: Secondary | ICD-10-CM | POA: Diagnosis not present

## 2016-10-30 DIAGNOSIS — E7800 Pure hypercholesterolemia, unspecified: Secondary | ICD-10-CM | POA: Diagnosis not present

## 2016-10-30 DIAGNOSIS — E559 Vitamin D deficiency, unspecified: Secondary | ICD-10-CM

## 2016-10-30 DIAGNOSIS — R7303 Prediabetes: Secondary | ICD-10-CM | POA: Diagnosis not present

## 2016-10-31 LAB — NMR, LIPOPROFILE
Cholesterol: 158 mg/dL (ref 100–199)
HDL Cholesterol by NMR: 43 mg/dL
HDL Particle Number: 27.4 umol/L — ABNORMAL LOW
LDL Particle Number: 1068 nmol/L — ABNORMAL HIGH
LDL Size: 20.5 nm
LDL-C: 87 mg/dL (ref 0–99)
LP-IR Score: 46 — ABNORMAL HIGH
Small LDL Particle Number: 522 nmol/L
Triglycerides by NMR: 139 mg/dL (ref 0–149)

## 2016-10-31 LAB — CBC WITH DIFFERENTIAL/PLATELET
Basophils Absolute: 0.1 10*3/uL (ref 0.0–0.2)
Basos: 1 %
EOS (ABSOLUTE): 0.2 10*3/uL (ref 0.0–0.4)
Eos: 4 %
Hematocrit: 37.6 % (ref 34.0–46.6)
Hemoglobin: 12.7 g/dL (ref 11.1–15.9)
Immature Grans (Abs): 0 10*3/uL (ref 0.0–0.1)
Immature Granulocytes: 0 %
Lymphocytes Absolute: 1.8 10*3/uL (ref 0.7–3.1)
Lymphs: 37 %
MCH: 30.2 pg (ref 26.6–33.0)
MCHC: 33.8 g/dL (ref 31.5–35.7)
MCV: 89 fL (ref 79–97)
Monocytes Absolute: 0.5 10*3/uL (ref 0.1–0.9)
Monocytes: 10 %
Neutrophils Absolute: 2.3 10*3/uL (ref 1.4–7.0)
Neutrophils: 48 %
Platelets: 210 10*3/uL (ref 150–379)
RBC: 4.21 x10E6/uL (ref 3.77–5.28)
RDW: 14.8 % (ref 12.3–15.4)
WBC: 4.9 10*3/uL (ref 3.4–10.8)

## 2016-10-31 LAB — BMP8+EGFR
BUN/Creatinine Ratio: 19 (ref 12–28)
BUN: 21 mg/dL (ref 8–27)
CO2: 30 mmol/L — ABNORMAL HIGH (ref 18–29)
Calcium: 9.5 mg/dL (ref 8.7–10.3)
Chloride: 99 mmol/L (ref 96–106)
Creatinine, Ser: 1.13 mg/dL — ABNORMAL HIGH (ref 0.57–1.00)
GFR calc Af Amer: 55 mL/min/{1.73_m2} — ABNORMAL LOW
GFR calc non Af Amer: 48 mL/min/{1.73_m2} — ABNORMAL LOW
Glucose: 94 mg/dL (ref 65–99)
Potassium: 4.1 mmol/L (ref 3.5–5.2)
Sodium: 142 mmol/L (ref 134–144)

## 2016-10-31 LAB — HEPATIC FUNCTION PANEL
ALT: 27 [IU]/L (ref 0–32)
AST: 25 [IU]/L (ref 0–40)
Albumin: 3.8 g/dL (ref 3.5–4.8)
Alkaline Phosphatase: 53 [IU]/L (ref 39–117)
Bilirubin Total: 0.3 mg/dL (ref 0.0–1.2)
Bilirubin, Direct: 0.09 mg/dL (ref 0.00–0.40)
Total Protein: 6.8 g/dL (ref 6.0–8.5)

## 2016-10-31 LAB — VITAMIN D 25 HYDROXY (VIT D DEFICIENCY, FRACTURES): Vit D, 25-Hydroxy: 53.1 ng/mL (ref 30.0–100.0)

## 2016-11-06 ENCOUNTER — Ambulatory Visit (INDEPENDENT_AMBULATORY_CARE_PROVIDER_SITE_OTHER): Payer: Medicare Other | Admitting: Family Medicine

## 2016-11-06 ENCOUNTER — Encounter: Payer: Self-pay | Admitting: Family Medicine

## 2016-11-06 VITALS — BP 148/72 | HR 69 | Temp 97.0°F | Ht 63.25 in | Wt 152.0 lb

## 2016-11-06 DIAGNOSIS — R7303 Prediabetes: Secondary | ICD-10-CM | POA: Diagnosis not present

## 2016-11-06 DIAGNOSIS — E559 Vitamin D deficiency, unspecified: Secondary | ICD-10-CM

## 2016-11-06 DIAGNOSIS — K2100 Gastro-esophageal reflux disease with esophagitis, without bleeding: Secondary | ICD-10-CM

## 2016-11-06 DIAGNOSIS — I10 Essential (primary) hypertension: Secondary | ICD-10-CM | POA: Diagnosis not present

## 2016-11-06 DIAGNOSIS — E7800 Pure hypercholesterolemia, unspecified: Secondary | ICD-10-CM

## 2016-11-06 NOTE — Patient Instructions (Addendum)
Medicare Annual Wellness Visit  [facility] and the medical providers at [facility] strive to bring you the best medical care.  In doing so we not only want to address your current medical conditions and concerns but also to detect new conditions early and prevent illness, disease and health-related problems.    Medicare offers a yearly Wellness Visit which allows our clinical staff to assess your need for preventative services including immunizations, lifestyle education, counseling to decrease risk of preventable diseases and screening for fall risk and other medical concerns.    This visit is provided free of charge (no copay) for all Medicare recipients. The clinical pharmacists at [facility] have begun to conduct these Wellness Visits which will also include a thorough review of all your medications.    As you primary medical provider recommend that you make an appointment for your Annual Wellness Visit if you have not done so already this year.  You may set up this appointment before you leave today or you may call back ([phone]) and schedule an appointment.  Please make sure when you call that you mention that you are scheduling your Annual Wellness Visit with the clinical pharmacist so that the appointment may be made for the proper length of time.     Continue current medications. Continue good therapeutic lifestyle changes which include good diet and exercise. Fall precautions discussed with patient. If an FOBT was given today- please return it to our front desk. If you are over 20 years old - you may need Prevnar 50 or the adult Pneumonia vaccine.  **Flu shots are available--- please call and schedule a FLU-CLINIC appointment**  After your visit with [place] today you will receive a survey in the mail or online from [employer] regarding your care with [place]. Please take a moment to fill this out. Your feedback is very  important to [place] as you can help [place] better understand your patient needs as well as improve your experience and satisfaction. WE CARE ABOUT YOU!!!  Continue to drink plenty of fluids and stay well hydrated Continue with current medications and aggressive therapeutic lifestyle changes We will prescribe Zovirax ointment for her lip which she can use now but especially in the future she has recurrences of what appears to be a fever blister.

## 2016-11-06 NOTE — Progress Notes (Signed)
Subjective:    Patient ID: Kimberly Suarez, female    DOB: 10/02/1940, 76 y.o.   MRN: [id number]  HPI Pt here for follow up and management of chronic medical problems which includes hyperlipidemia and hypertenison. She is taking medication regularly.The patient is doing well overall with no particular complaints. She has history of hypertension hyperlipidemia and arthritis. She had a recent respiratory infection but has recovered from this. The patient has had recent lab work done and this will be reviewed with her during the visit today. The creatinine, which is been elevated in the past remains elevated but is improved from the past reading. The blood sugar was good and the remaining electrolytes were good. The CBC was normal. The liver function tests were normal. The vitamin D level was good at 53.1 and she will continue with her current treatment. Cholesterol numbers with advanced lipid testing have a total LDL particle number that remains only slightly elevated and consistent with past readings. The patient denies any chest pain or shortness of breath. She denies any trouble with nausea vomiting diarrhea or blood in the stool and will be due a repeat FOBT the end of April. She does have occasional trouble swallowing cornbread and [place]. This is not getting any worse than usual. If it worsens she understands to let us know and that she may need an endoscopy and dilatation. She has seen Dr. Elmo Putt in the past for her colitis which is much better. The patient denies any problems with passing her water other than frequency. She is due to have her eyes examined soon and will get an appointment herself for this but we'll make sure that we get a copy of the record once it is done.   Patient Active Problem List   Diagnosis Date Noted  . Diarrhea 06/19/2016  . Pre-diabetes 06/01/2014  . Renal insufficiency 11/18/2013  . Osteoarthritis of left knee 11/18/2013  . HTN (hypertension) 06/26/2013  .  Microscopic colitis 10/30/2012  . Hyperlipidemia 10/30/2012  . Unspecified constipation 07/10/2011  . Dysphagia, unspecified(787.20) 07/10/2011  . Diverticulosis of colon (without mention of hemorrhage) 07/10/2011  . Special screening for malignant neoplasms, colon 07/10/2011  . GERD with stricture 07/10/2011   Outpatient Encounter Prescriptions as of 11/06/2016  Medication Sig  . AMBULATORY NON FORMULARY MEDICATION Medication Name: Omega XL, take 2 capsules daily for joints, etc  . aspirin 81 MG tablet Take 81 mg by mouth daily.    Marland Kitchen atorvastatin (LIPITOR) 80 MG tablet Take 1 tablet (80 mg total) by mouth daily.  Marland Kitchen b complex vitamins capsule Take 1 capsule by mouth daily.  . Calcium Carbonate-Simethicone (TUMS PLUS PO) Take 1 tablet by mouth as needed.  . enalapril (VASOTEC) 10 MG tablet Take 1 tablet (10 mg total) by mouth 2 (two) times daily.  Marland Kitchen estradiol (ESTRACE) 0.1 MG/GM vaginal cream Place 1 Applicatorful vaginally at bedtime.  . Glucosamine-Chondroitin (GLUCOSAMINE CHONDR COMPLEX PO) Take 1 tablet by mouth 2 (two) times daily.   . hydrochlorothiazide (HYDRODIURIL) 25 MG tablet TAKE 1 TABLET BY MOUTH  DAILY  . loperamide (IMODIUM) 2 MG capsule Take 2 mg by mouth as needed for diarrhea or loose stools.  . Multiple Vitamins-Minerals (MULTIVITAMIN ADULT PO) Take 1 tablet by mouth daily.  . NONFORMULARY OR COMPOUNDED ITEM Budesonide 5 mg capsules Take 2 by mouth daily for 4 weeks, then take 1 5mg  capsule along with 1 2.5mg  capsule for 4 weeks (pt already has 2.5mg  caps)  . Omega-3 Fatty Acids (  FISH OIL) 1000 MG CAPS Take 1 capsule by mouth daily.   . ondansetron (ZOFRAN ODT) 4 MG disintegrating tablet Take 1 tablet (4 mg total) by mouth every 8 (eight) hours as needed for nausea or vomiting.  . ranitidine (ZANTAC) 150 MG tablet Take 1 tablet (150 mg total) by mouth 2 (two) times daily.  Marland Kitchen triamcinolone cream (KENALOG) 0.1 %    No facility-administered encounter medications on file as  of 11/06/2016.      Review of Systems  Constitutional: Negative.   HENT: Negative.   Eyes: Negative.   Respiratory: Negative.   Cardiovascular: Negative.   Gastrointestinal: Negative.   Endocrine: Negative.   Genitourinary: Negative.   Musculoskeletal: Negative.   Skin: Negative.   Allergic/Immunologic: Negative.   Neurological: Negative.   Hematological: Negative.   Psychiatric/Behavioral: Negative.        Objective:   Physical Exam  Constitutional: She is oriented to person, place, and time. She appears well-developed and well-nourished. No distress.  HENT:  Head: Normocephalic and atraumatic.  Right Ear: External ear normal.  Left Ear: External ear normal.  Nose: Nose normal.  Mouth/Throat: Oropharynx is clear and moist. No oropharyngeal exudate.  Eyes: Conjunctivae and EOM are normal. Pupils are equal, round, and reactive to light. Right eye exhibits no discharge. Left eye exhibits no discharge. No scleral icterus.  Neck: Normal range of motion. Neck supple. No thyromegaly present.  No thyromegaly or anterior cervical adenopathy or bruits  Cardiovascular: Normal rate, regular rhythm, normal heart sounds and intact distal pulses.   No murmur heard. Heart has a regular rate and rhythm at 72/m  Pulmonary/Chest: Effort normal and breath sounds normal. No respiratory distress. She has no wheezes. She has no rales.  Clear anteriorly and posteriorly  Abdominal: Soft. Bowel sounds are normal. She exhibits no mass. There is no tenderness. There is no rebound and no guarding.  No liver or spleen enlargement and no epigastric or suprapubic tenderness. No masses.  Musculoskeletal: Normal range of motion. She exhibits no edema.  Lymphadenopathy:    She has no cervical adenopathy.  Neurological: She is alert and oriented to person, place, and time. She has normal reflexes. No cranial nerve deficit.  Skin: Skin is warm and dry. No rash noted.  Patient does have a cold sore type  lesion above her lip on the right side and we will intended to monitor this and she understands to let us know if it does not heal up.  Psychiatric: She has a normal mood and affect. Her behavior is normal. Judgment and thought content normal.  Nursing note and vitals reviewed.   BP (!) 148/72 (BP Location: Left Arm)   Pulse 69   Temp 97 F (36.1 C) (Oral)   Ht 5' 3.25" (1.607 m)   Wt 152 lb (68.9 kg)   BMI 26.71 kg/m        Assessment & Plan:  1. Pure hypercholesterolemia -Cholesterol numbers were stable and she will continue with her current treatment and aggressive therapeutic lifestyle changes  2. Essential hypertension -The blood pressure was good today and she will continue with current treatment  3. Vitamin D deficiency -Continue with current treatment   4. Gastroesophageal reflux disease with esophagitis -She does have some problems with swallowing certain foods. She will continue with her ranitidine and if the swallowing problem progresses she will get back in touch with [place] and we will arrange for her to have an endoscopy and possible dilatation with her gastroenterologist.  5. Prediabetes -Continue with aggressive therapeutic lifestyle changes  Patient Instructions                       Medicare Annual Wellness Visit  [facility] and the medical providers at [facility] strive to bring you the best medical care.  In doing so we not only want to address your current medical conditions and concerns but also to detect new conditions early and prevent illness, disease and health-related problems.    Medicare offers a yearly Wellness Visit which allows our clinical staff to assess your need for preventative services including immunizations, lifestyle education, counseling to decrease risk of preventable diseases and screening for fall risk and other medical concerns.    This visit is provided free of charge (no copay) for all Medicare recipients.  The clinical pharmacists at [facility] have begun to conduct these Wellness Visits which will also include a thorough review of all your medications.    As you primary medical provider recommend that you make an appointment for your Annual Wellness Visit if you have not done so already this year.  You may set up this appointment before you leave today or you may call back ([phone]) and schedule an appointment.  Please make sure when you call that you mention that you are scheduling your Annual Wellness Visit with the clinical pharmacist so that the appointment may be made for the proper length of time.     Continue current medications. Continue good therapeutic lifestyle changes which include good diet and exercise. Fall precautions discussed with patient. If an FOBT was given today- please return it to our front desk. If you are over 21 years old - you may need Prevnar 57 or the adult Pneumonia vaccine.  **Flu shots are available--- please call and schedule a FLU-CLINIC appointment**  After your visit with [place] today you will receive a survey in the mail or online from [employer] regarding your care with [place]. Please take a moment to fill this out. Your feedback is very important to [place] as you can help [place] better understand your patient needs as well as improve your experience and satisfaction. WE CARE ABOUT YOU!!!  Continue to drink plenty of fluids and stay well hydrated Continue with current medications and aggressive therapeutic lifestyle changes We will prescribe Zovirax ointment for her lip which she can use now but especially in the future she has recurrences of what appears to be a fever blister.  Arrie Senate MD

## 2016-11-09 MED ORDER — ACYCLOVIR 5 % EX OINT: 1.0000 "application " | TOPICAL_OINTMENT | CUTANEOUS | 1 refills | Status: DC

## 2016-11-09 NOTE — Addendum Note (Signed)
Addended by: Zannie Cove on: 11/09/2016 12:04 PM   Modules accepted: Orders

## 2016-11-29 ENCOUNTER — Other Ambulatory Visit: Payer: Self-pay | Admitting: Family Medicine

## 2016-12-04 ENCOUNTER — Other Ambulatory Visit: Payer: Self-pay | Admitting: Family Medicine

## 2016-12-04 DIAGNOSIS — A084 Viral intestinal infection, unspecified: Secondary | ICD-10-CM

## 2016-12-04 DIAGNOSIS — K2100 Gastro-esophageal reflux disease with esophagitis, without bleeding: Secondary | ICD-10-CM

## 2016-12-28 ENCOUNTER — Encounter: Payer: Self-pay | Admitting: Gynecology

## 2016-12-28 ENCOUNTER — Ambulatory Visit (INDEPENDENT_AMBULATORY_CARE_PROVIDER_SITE_OTHER): Payer: Medicare Other | Admitting: Gynecology

## 2016-12-28 VITALS — BP 120/76 | Ht 63.0 in | Wt 150.0 lb

## 2016-12-28 DIAGNOSIS — H52203 Unspecified astigmatism, bilateral: Secondary | ICD-10-CM | POA: Diagnosis not present

## 2016-12-28 DIAGNOSIS — Z124 Encounter for screening for malignant neoplasm of cervix: Secondary | ICD-10-CM | POA: Diagnosis not present

## 2016-12-28 DIAGNOSIS — Z01411 Encounter for gynecological examination (general) (routine) with abnormal findings: Secondary | ICD-10-CM

## 2016-12-28 DIAGNOSIS — N952 Postmenopausal atrophic vaginitis: Secondary | ICD-10-CM | POA: Diagnosis not present

## 2016-12-28 DIAGNOSIS — N8189 Other female genital prolapse: Secondary | ICD-10-CM | POA: Diagnosis not present

## 2016-12-28 DIAGNOSIS — Z961 Presence of intraocular lens: Secondary | ICD-10-CM | POA: Diagnosis not present

## 2016-12-28 DIAGNOSIS — H524 Presbyopia: Secondary | ICD-10-CM | POA: Diagnosis not present

## 2016-12-28 DIAGNOSIS — Z8639 Personal history of other endocrine, nutritional and metabolic disease: Secondary | ICD-10-CM | POA: Diagnosis not present

## 2016-12-28 MED ORDER — ESTRADIOL 0.1 MG/GM VA CREA: 1.0000 | TOPICAL_CREAM | VAGINAL | 2 refills | Status: DC

## 2016-12-28 NOTE — Progress Notes (Signed)
    Kimberly Suarez 1941/08/16 [id number]        76 y.o.  G3P3003 for annual exam.    Past medical history,surgical history, problem list, medications, allergies, family history and social history were all reviewed and documented as reviewed in the EPIC chart.  ROS:  Performed with pertinent positives and negatives included in the history, assessment and plan.   Additional significant findings :  None   Exam: Kimberly Suarez assistant Vitals:   12/28/16 1110  BP: 120/76  Weight: 150 lb (68 kg)  Height: 5\' 3"  (1.6 m)   Body mass index is 26.57 kg/m.  General appearance:  Normal affect, orientation and appearance. Skin: Grossly normal HEENT: Without gross lesions.  No cervical or supraclavicular adenopathy. Thyroid normal.  Lungs:  Clear without wheezing, rales or rhonchi Cardiac: RR, without RMG Abdominal:  Soft, nontender, without masses, guarding, rebound, organomegaly or hernia Breasts:  Examined lying and sitting without masses, retractions, discharge or axillary adenopathy. Pelvic:  Ext, BUS, Vagina: With atrophic changes. First to second-degree uterine prolapse. First degree to second-degree cystocele. First degree rectocele.  Cervix: With atrophic changes. Pap smear done  Uterus: Axial, normal size, shape and contour, midline and mobile nontender   Adnexa: Without masses or tenderness    Anus and perineum: Normal   Rectovaginal: Normal sphincter tone without palpated masses or tenderness.    Assessment/Plan:  76 y.o. G3P3003 female for annual exam.   1. Postmenopausal/atrophic genital changes. This having some vaginal dryness on and off and uses Estrace cream occasionally. I reviewed the risks of absorption with systemic effects to include stroke heart attack DVT breast and endometrial stimulation. Patient asked for written prescription and she is going to decide if she wants to use this as she is undecided at this time. She knows the importance of calling if any vaginal  bleeding or other issues. 2. Pelvic relaxation with cystocele, rectocele, uterine prolapse. Uses ring pessary intermittently for support. She is able to place it herself. No evidence of irritation or erosion on exam. 3. Pap smear 2016. History of ASCUS 2 with negative high-risk HPV 2011. Follow up Pap smears normal. Patient uncomfortable with spacing them out or stopping and a Pap smear was done today. 4. DEXA 2017 normal. Arranges to her primary physician and she'll follow up with him in reference to this. 5. Colonoscopy 2012. Repeat at their recommended interval. 6. Mammography October 2016. Reminded patient she is overdue when she agrees to call and schedule this. SBE monthly reviewed. 7. Health maintenance. No routine lab work done as patient does this elsewhere. Follow up 1 year, sooner as needed.   [name], 11:48 AM 12/28/2016

## 2016-12-28 NOTE — Patient Instructions (Signed)
Schedule your mammogram;

## 2016-12-28 NOTE — Addendum Note (Signed)
Addended by: Nelva Nay on: 12/28/2016 12:29 PM   Modules accepted: Orders

## 2017-01-01 LAB — PAP IG (IMAGE GUIDED)

## 2017-03-01 ENCOUNTER — Encounter: Payer: Self-pay | Admitting: Physician Assistant

## 2017-03-01 ENCOUNTER — Ambulatory Visit (INDEPENDENT_AMBULATORY_CARE_PROVIDER_SITE_OTHER): Payer: Medicare Other | Admitting: Physician Assistant

## 2017-03-01 VITALS — BP 129/67 | HR 88 | Temp 97.2°F | Ht 63.0 in | Wt 152.0 lb

## 2017-03-01 DIAGNOSIS — R109 Unspecified abdominal pain: Secondary | ICD-10-CM

## 2017-03-01 LAB — URINALYSIS
Bilirubin, UA: NEGATIVE
Glucose, UA: NEGATIVE
Ketones, UA: NEGATIVE
Nitrite, UA: NEGATIVE
Protein, UA: NEGATIVE
Specific Gravity, UA: 1.01 (ref 1.005–1.030)
Urobilinogen, Ur: 0.2 mg/dL (ref 0.2–1.0)
pH, UA: 5.5 (ref 5.0–7.5)

## 2017-03-01 MED ORDER — NITROFURANTOIN MONOHYD MACRO 100 MG PO CAPS: 100.0000 mg | ORAL_CAPSULE | Freq: Two times a day (BID) | ORAL | 0 refills | Status: DC

## 2017-03-01 NOTE — Patient Instructions (Signed)

## 2017-03-01 NOTE — Progress Notes (Signed)
Subjective:     Patient ID: Kimberly Suarez, female   DOB: October 14, 1940, 76 y.o.   MRN: [id number]  HPI Pt with abd pain for several days States last cramping sx were yesterday Concerned about possible UTI  Review of Systems  Constitutional: Positive for activity change and fever. Negative for appetite change, diaphoresis and fatigue.  HENT: Negative.   Respiratory: Negative.   Cardiovascular: Negative.   Gastrointestinal: Positive for abdominal pain. Negative for abdominal distention, constipation, diarrhea, nausea and vomiting.  Genitourinary: Positive for dysuria and frequency. Negative for difficulty urinating, hematuria and pelvic pain.       Objective:   Physical Exam  Constitutional: She appears well-developed and well-nourished.  HENT:  Mouth/Throat: Oropharynx is clear and moist. No oropharyngeal exudate.  Neck: Neck supple.  Cardiovascular: Normal rate, regular rhythm and normal heart sounds.   No murmur heard. Pulmonary/Chest: Effort normal and breath sounds normal.  Abdominal: Soft. She exhibits no distension and no mass. There is tenderness. There is no rebound and no guarding.  No CVAT  Lymphadenopathy:    She has no cervical adenopathy.  Nursing note and vitals reviewed. UA- see labs     Assessment:     1. Abdominal cramping        Plan:     Hydrate well Due to urine Macrobid bid x 1 week OTC meds for sx F/U prn

## 2017-03-08 ENCOUNTER — Other Ambulatory Visit: Payer: Self-pay | Admitting: Physician Assistant

## 2017-03-08 DIAGNOSIS — C44722 Squamous cell carcinoma of skin of right lower limb, including hip: Secondary | ICD-10-CM | POA: Diagnosis not present

## 2017-03-08 DIAGNOSIS — L570 Actinic keratosis: Secondary | ICD-10-CM | POA: Diagnosis not present

## 2017-03-08 DIAGNOSIS — C44729 Squamous cell carcinoma of skin of left lower limb, including hip: Secondary | ICD-10-CM | POA: Diagnosis not present

## 2017-04-19 ENCOUNTER — Other Ambulatory Visit: Payer: Self-pay | Admitting: Family Medicine

## 2017-04-19 DIAGNOSIS — Z1231 Encounter for screening mammogram for malignant neoplasm of breast: Secondary | ICD-10-CM

## 2017-05-02 ENCOUNTER — Encounter: Payer: Self-pay | Admitting: *Deleted

## 2017-05-02 ENCOUNTER — Ambulatory Visit
Admission: RE | Admit: 2017-05-02 | Discharge: 2017-05-02 | Disposition: A | Discharge status: Home / Self Care | Payer: Medicare Other | Source: Ambulatory Visit | Attending: Family Medicine | Admitting: Family Medicine

## 2017-05-02 DIAGNOSIS — Z1231 Encounter for screening mammogram for malignant neoplasm of breast: Secondary | ICD-10-CM

## 2017-05-04 ENCOUNTER — Other Ambulatory Visit: Payer: Medicare Other

## 2017-05-04 DIAGNOSIS — R300 Dysuria: Secondary | ICD-10-CM

## 2017-05-04 DIAGNOSIS — R7303 Prediabetes: Secondary | ICD-10-CM

## 2017-05-04 DIAGNOSIS — E7800 Pure hypercholesterolemia, unspecified: Secondary | ICD-10-CM | POA: Diagnosis not present

## 2017-05-04 DIAGNOSIS — I10 Essential (primary) hypertension: Secondary | ICD-10-CM | POA: Diagnosis not present

## 2017-05-04 DIAGNOSIS — R6889 Other general symptoms and signs: Secondary | ICD-10-CM | POA: Diagnosis not present

## 2017-05-04 DIAGNOSIS — E559 Vitamin D deficiency, unspecified: Secondary | ICD-10-CM

## 2017-05-04 DIAGNOSIS — K2100 Gastro-esophageal reflux disease with esophagitis, without bleeding: Secondary | ICD-10-CM

## 2017-05-05 LAB — CBC WITH DIFFERENTIAL/PLATELET
Basophils Absolute: 0.1 10*3/uL (ref 0.0–0.2)
Basos: 1 %
EOS (ABSOLUTE): 0.4 10*3/uL (ref 0.0–0.4)
Eos: 8 %
Hematocrit: 40.2 % (ref 34.0–46.6)
Hemoglobin: 12.9 g/dL (ref 11.1–15.9)
Immature Grans (Abs): 0 10*3/uL (ref 0.0–0.1)
Immature Granulocytes: 0 %
Lymphocytes Absolute: 2.1 10*3/uL (ref 0.7–3.1)
Lymphs: 40 %
MCH: 29.9 pg (ref 26.6–33.0)
MCHC: 32.1 g/dL (ref 31.5–35.7)
MCV: 93 fL (ref 79–97)
Monocytes Absolute: 0.5 10*3/uL (ref 0.1–0.9)
Monocytes: 9 %
Neutrophils Absolute: 2.2 10*3/uL (ref 1.4–7.0)
Neutrophils: 42 %
Platelets: 241 10*3/uL (ref 150–379)
RBC: 4.31 x10E6/uL (ref 3.77–5.28)
RDW: 14 % (ref 12.3–15.4)
WBC: 5.3 10*3/uL (ref 3.4–10.8)

## 2017-05-05 LAB — LIPID PANEL
Chol/HDL Ratio: 3.5 ratio (ref 0.0–4.4)
Cholesterol, Total: 139 mg/dL (ref 100–199)
HDL: 40 mg/dL
LDL Calculated: 67 mg/dL (ref 0–99)
Triglycerides: 159 mg/dL — ABNORMAL HIGH (ref 0–149)
VLDL Cholesterol Cal: 32 mg/dL (ref 5–40)

## 2017-05-05 LAB — BMP8+EGFR
BUN/Creatinine Ratio: 17 (ref 12–28)
BUN: 24 mg/dL (ref 8–27)
CO2: 25 mmol/L (ref 20–29)
Calcium: 9.9 mg/dL (ref 8.7–10.3)
Chloride: 96 mmol/L (ref 96–106)
Creatinine, Ser: 1.45 mg/dL — ABNORMAL HIGH (ref 0.57–1.00)
GFR calc Af Amer: 40 mL/min/{1.73_m2} — ABNORMAL LOW
GFR calc non Af Amer: 35 mL/min/{1.73_m2} — ABNORMAL LOW
Glucose: 95 mg/dL (ref 65–99)
Potassium: 4.1 mmol/L (ref 3.5–5.2)
Sodium: 138 mmol/L (ref 134–144)

## 2017-05-05 LAB — HEPATIC FUNCTION PANEL
ALT: 18 [IU]/L (ref 0–32)
AST: 25 [IU]/L (ref 0–40)
Albumin: 4.3 g/dL (ref 3.5–4.8)
Alkaline Phosphatase: 49 [IU]/L (ref 39–117)
Bilirubin Total: 0.3 mg/dL (ref 0.0–1.2)
Bilirubin, Direct: 0.1 mg/dL (ref 0.00–0.40)
Total Protein: 7.3 g/dL (ref 6.0–8.5)

## 2017-05-05 LAB — VITAMIN D 25 HYDROXY (VIT D DEFICIENCY, FRACTURES): Vit D, 25-Hydroxy: 56.8 ng/mL (ref 30.0–100.0)

## 2017-05-07 ENCOUNTER — Other Ambulatory Visit: Payer: Medicare Other

## 2017-05-07 ENCOUNTER — Other Ambulatory Visit: Payer: Self-pay | Admitting: *Deleted

## 2017-05-07 DIAGNOSIS — R300 Dysuria: Secondary | ICD-10-CM

## 2017-05-08 LAB — URINE CULTURE

## 2017-05-09 ENCOUNTER — Encounter: Payer: Self-pay | Admitting: Family Medicine

## 2017-05-09 ENCOUNTER — Ambulatory Visit (INDEPENDENT_AMBULATORY_CARE_PROVIDER_SITE_OTHER): Payer: Medicare Other | Admitting: Family Medicine

## 2017-05-09 VITALS — BP 120/78 | HR 66 | Temp 97.3°F | Ht 63.0 in | Wt 153.0 lb

## 2017-05-09 DIAGNOSIS — R200 Anesthesia of skin: Secondary | ICD-10-CM | POA: Diagnosis not present

## 2017-05-09 DIAGNOSIS — E7800 Pure hypercholesterolemia, unspecified: Secondary | ICD-10-CM

## 2017-05-09 DIAGNOSIS — G629 Polyneuropathy, unspecified: Secondary | ICD-10-CM

## 2017-05-09 DIAGNOSIS — A084 Viral intestinal infection, unspecified: Secondary | ICD-10-CM

## 2017-05-09 DIAGNOSIS — N183 Chronic kidney disease, stage 3 unspecified: Secondary | ICD-10-CM

## 2017-05-09 DIAGNOSIS — E559 Vitamin D deficiency, unspecified: Secondary | ICD-10-CM

## 2017-05-09 DIAGNOSIS — I10 Essential (primary) hypertension: Secondary | ICD-10-CM

## 2017-05-09 DIAGNOSIS — R5383 Other fatigue: Secondary | ICD-10-CM

## 2017-05-09 DIAGNOSIS — K2100 Gastro-esophageal reflux disease with esophagitis, without bleeding: Secondary | ICD-10-CM

## 2017-05-09 DIAGNOSIS — R7303 Prediabetes: Secondary | ICD-10-CM | POA: Diagnosis not present

## 2017-05-09 LAB — URINALYSIS, COMPLETE
Bilirubin, UA: NEGATIVE
Glucose, UA: NEGATIVE
Ketones, UA: NEGATIVE
Nitrite, UA: NEGATIVE
Specific Gravity, UA: 1.02 (ref 1.005–1.030)
Urobilinogen, Ur: 0.2 mg/dL (ref 0.2–1.0)
pH, UA: 5.5 (ref 5.0–7.5)

## 2017-05-09 LAB — MICROSCOPIC EXAMINATION
Epithelial Cells (non renal): >10 /HPF — AB
Renal Epithel, UA: NONE SEEN /HPF

## 2017-05-09 MED ORDER — RANITIDINE HCL 150 MG PO TABS: 150.0000 mg | ORAL_TABLET | Freq: Two times a day (BID) | ORAL | 3 refills | Status: DC

## 2017-05-09 NOTE — Patient Instructions (Addendum)
Medicare Annual Wellness Visit  [facility] and the medical providers at [facility] strive to bring you the best medical care.  In doing so we not only want to address your current medical conditions and concerns but also to detect new conditions early and prevent illness, disease and health-related problems.    Medicare offers a yearly Wellness Visit which allows our clinical staff to assess your need for preventative services including immunizations, lifestyle education, counseling to decrease risk of preventable diseases and screening for fall risk and other medical concerns.    This visit is provided free of charge (no copay) for all Medicare recipients. The clinical pharmacists at [facility] have begun to conduct these Wellness Visits which will also include a thorough review of all your medications.    As you primary medical provider recommend that you make an appointment for your Annual Wellness Visit if you have not done so already this year.  You may set up this appointment before you leave today or you may call back ([phone]) and schedule an appointment.  Please make sure when you call that you mention that you are scheduling your Annual Wellness Visit with the clinical pharmacist so that the appointment may be made for the proper length of time.     Continue current medications. Continue good therapeutic lifestyle changes which include good diet and exercise. Fall precautions discussed with patient. If an FOBT was given today- please return it to our front desk. If you are over 61 years old - you may need Prevnar 33 or the adult Pneumonia vaccine.  **Flu shots are available--- please call and schedule a FLU-CLINIC appointment**  After your visit with [place] today you will receive a survey in the mail or online from [employer] regarding your care with [place]. Please take a moment to fill this out. Your feedback is very  important to [place] as you can help [place] better understand your patient needs as well as improve your experience and satisfaction. WE CARE ABOUT YOU!!!   We will schedule you for a visit with the neurologist to do PNCV testing which we'll check for carpal tunnel syndrome and could explain some of the symptoms her having with your hands Please check some home blood pressures and bring readings back and get a BMP in about 4 weeks Watch the sodium intake We will add additional tests the blood work that has already been done and these tests are B12 level and thyroid profile to see if that could explain some of your fatigue and symptoms that you're having. Remember to use the [place] ear wax in the left ear canal Remember to take some allergy medicine if you have some at home and also use Flonase more regularly for the nasal congestion Drink plenty of fluids and stay well hydrated

## 2017-05-09 NOTE — Progress Notes (Signed)
Subjective:    Patient ID: Kimberly Suarez, female    DOB: 07/29/1941, 76 y.o.   MRN: [id number]  HPI Pt here for follow up and management of chronic medical problems which includes hyperlipemia and hypertension. She is taking medication regularly.The patient is a well overall. She had a recent urine culture that grew mixed urogenital flora with no specific agent to treat. She also is complaining of being fatigued for 1 week. She is requesting a refill on her H2 blocker or ranitidine. She'll be given an FOBT to return in her lab work will be reviewed with her during the visit. All cholesterol numbers were good especially the LDL C being 67 triglycerides were slightly elevated at 159. HDL was good. The CBC had a normal white blood cell count with a good hemoglobin at 12.9 which was stable for her. Platelet count was adequate. Blood sugar was good. The creatinine this time however was more elevated at 1.45 and we will question her about taking NSAIDs and making sure she is not doing this. All of the electrolytes including potassium are good. The vitamin D level is good at 56.8. All liver function tests are normal. If she is still having symptoms with passing her water will probably repeat urinalysis and a urine culture and sensitivity. The patient describes a little more fatigued than usual all for lab work was good. We did not get a thyroid profile or a B12 level and we will add this to the blood work. Her creatinine was elevated and she denies taking any anti-inflammatory medicines. She says her blood pressure home was low this morning but on repeat here I got 120/78 sitting in the right arm with a regular cuff. She denies any chest pain or shortness of breath. She denies any trouble with her intestinal track and says her bowels are normal now and she is not having any diarrhea. She is passing her water with only a little bit of burning in the most recent urine culture did not show any significant growth that  could be treated.     Patient Active Problem List   Diagnosis Date Noted  . Diarrhea 06/19/2016  . Pre-diabetes 06/01/2014  . Renal insufficiency 11/18/2013  . Osteoarthritis of left knee 11/18/2013  . HTN (hypertension) 06/26/2013  . Microscopic colitis 10/30/2012  . Hyperlipidemia 10/30/2012  . Unspecified constipation 07/10/2011  . Dysphagia, unspecified(787.20) 07/10/2011  . Diverticulosis of colon (without mention of hemorrhage) 07/10/2011  . Special screening for malignant neoplasms, colon 07/10/2011  . GERD with stricture 07/10/2011   Outpatient Encounter Prescriptions as of 05/09/2017  Medication Sig  . AMBULATORY NON FORMULARY MEDICATION Medication Name: Omega XL, take 2 capsules daily for joints, etc  . aspirin 81 MG tablet Take 81 mg by mouth daily.    Marland Kitchen atorvastatin (LIPITOR) 80 MG tablet TAKE 1 TABLET BY MOUTH  DAILY  . b complex vitamins capsule Take 1 capsule by mouth daily.  . Calcium Carbonate-Simethicone (TUMS PLUS PO) Take 1 tablet by mouth as needed.  . enalapril (VASOTEC) 10 MG tablet TAKE 1 TABLET BY MOUTH TWO  TIMES DAILY  . Glucosamine-Chondroitin (GLUCOSAMINE CHONDR COMPLEX PO) Take 1 tablet by mouth 2 (two) times daily.   . hydrochlorothiazide (HYDRODIURIL) 25 MG tablet TAKE 1 TABLET BY MOUTH  DAILY  . Multiple Vitamins-Minerals (MULTIVITAMIN ADULT PO) Take 1 tablet by mouth daily.  . Omega-3 Fatty Acids (FISH OIL) 1000 MG CAPS Take 1 capsule by mouth daily.   . ranitidine (ZANTAC)  150 MG tablet TAKE ONE TABLET BY MOUTH TWICE DAILY  . [DISCONTINUED] estradiol (ESTRACE) 0.1 MG/GM vaginal cream Place 1 Applicatorful vaginally 2 (two) times a week.  . [DISCONTINUED] nitrofurantoin, macrocrystal-monohydrate, (MACROBID) 100 MG capsule Take 1 capsule (100 mg total) by mouth 2 (two) times daily. 1 po BId  . [DISCONTINUED] triamcinolone cream (KENALOG) 0.1 %    No facility-administered encounter medications on file as of 05/09/2017.      Review of Systems    Constitutional: Positive for fatigue (for about a week  / more sleepy than usual).  HENT: Negative.   Eyes: Negative.   Respiratory: Negative.   Cardiovascular: Negative.   Gastrointestinal: Negative.   Endocrine: Negative.   Genitourinary: Negative.   Musculoskeletal: Negative.   Skin: Negative.   Allergic/Immunologic: Negative.   Neurological: Positive for weakness.  Hematological: Negative.   Psychiatric/Behavioral: Negative.        Objective:   Physical Exam  Constitutional: She is oriented to person, place, and time. She appears well-developed and well-nourished. No distress.  The patient is pleasant and alert  HENT:  Head: Normocephalic and atraumatic.  Right Ear: External ear normal.  Mouth/Throat: Oropharynx is clear and moist.  Slight nasal congestion bilaterally and ears cerumen in the left ear canal  Eyes: Pupils are equal, round, and reactive to light. Conjunctivae and EOM are normal. Right eye exhibits no discharge. Left eye exhibits no discharge. No scleral icterus.  Neck: Normal range of motion. Neck supple. No thyromegaly present.  No bruits thyromegaly or anterior cervical adenopathy  Cardiovascular: Normal rate, regular rhythm, normal heart sounds and intact distal pulses.   No murmur heard. The heart has a regular rate and rhythm at 72/m  Pulmonary/Chest: Effort normal and breath sounds normal. No respiratory distress. She has no wheezes. She has no rales.  Clear anteriorly and posteriorly  Abdominal: Soft. Bowel sounds are normal. She exhibits no mass. There is tenderness. There is no rebound and no guarding.  Slight epigastric tenderness but no more than usual according to the patient. There was no liver or spleen enlargement no bruits and no inguinal adenopathy.  Musculoskeletal: Normal range of motion. She exhibits no edema, tenderness or deformity.  Lymphadenopathy:    She has no cervical adenopathy.  Neurological: She is alert and oriented to person,  place, and time. She has normal reflexes. No cranial nerve deficit.  There is some thenar atrophy bilaterally left greater than right. Patient's symptoms are more on the right than the left. She has good radial pulses.  Skin: Skin is warm and dry. No rash noted.  Psychiatric: She has a normal mood and affect. Her behavior is normal. Judgment and thought content normal.  Nursing note and vitals reviewed.  BP (!) 98/59 (BP Location: Left Arm)   Pulse 66   Temp (!) 97.3 F (36.3 C) (Oral)   Ht 5\' 3"  (1.6 m)   Wt 153 lb (69.4 kg)   BMI 27.10 kg/m   Repeat blood pressure 120/78 right arm sitting with a regular cuff      Assessment & Plan:  1. Pure hypercholesterolemia --Cholesterol numbers were good and she will continue with current treatment  2. Essential hypertension -Repeat blood pressure was good for the patient at 120/78 in the right arm sitting with a regular cuff done manually. She will continue with her current treatment. She will record some readings at home and bring these readings in with her for review when she gets her BMP repeated in about  4 weeks.  3. Vitamin D deficiency -Continue current treatment  4. Gastroesophageal reflux disease with esophagitis -No complaints with reflux today and she will continue with current treatment - ranitidine (ZANTAC) 150 MG tablet; Take 1 tablet (150 mg total) by mouth 2 (two) times daily.  Dispense: 180 tablet; Refill: 3  5. Prediabetes -Blood sugar was good at 95.  6. Viral gastroenteritis -The patient has normal stools now is having no more problems with loose bowel movements. - ranitidine (ZANTAC) 150 MG tablet; Take 1 tablet (150 mg total) by mouth 2 (two) times daily.  Dispense: 180 tablet; Refill: 3  7. Neuropathy -The patient complains of numbness and feeling like her hands are going to sleep in both hands. 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 She wears a brace on the right hand. We will get  testing to evaluate for carpal tunnel syndrome.  8. Fatigue, unspecified type -Add B12 level and thyroid profile to current lab work  9. Chronic kidney disease -Creatinine elevated more than usual and we will repeat BMP in 4 weeks and patient will continue to avoid all NSAIDs.  Meds ordered this encounter  Medications  . ranitidine (ZANTAC) 150 MG tablet    Sig: Take 1 tablet (150 mg total) by mouth 2 (two) times daily.    Dispense:  180 tablet    Refill:  3   Patient Instructions                       Medicare Annual Wellness Visit  [facility] and the medical providers at [facility] strive to bring you the best medical care.  In doing so we not only want to address your current medical conditions and concerns but also to detect new conditions early and prevent illness, disease and health-related problems.    Medicare offers a yearly Wellness Visit which allows our clinical staff to assess your need for preventative services including immunizations, lifestyle education, counseling to decrease risk of preventable diseases and screening for fall risk and other medical concerns.    This visit is provided free of charge (no copay) for all Medicare recipients. The clinical pharmacists at [facility] have begun to conduct these Wellness Visits which will also include a thorough review of all your medications.    As you primary medical provider recommend that you make an appointment for your Annual Wellness Visit if you have not done so already this year.  You may set up this appointment before you leave today or you may call back ([phone]) and schedule an appointment.  Please make sure when you call that you mention that you are scheduling your Annual Wellness Visit with the clinical pharmacist so that the appointment may be made for the proper length of time.     Continue current medications. Continue good therapeutic lifestyle changes which  include good diet and exercise. Fall precautions discussed with patient. If an FOBT was given today- please return it to our front desk. If you are over 15 years old - you may need Prevnar 23 or the adult Pneumonia vaccine.  **Flu shots are available--- please call and schedule a FLU-CLINIC appointment**  After your visit with [place] today you will receive a survey in the mail or online from [employer] regarding your care with [place]. Please take a moment to fill this out. Your feedback is very important to [place] as you can help  [place] better understand your patient needs as well as improve your experience and satisfaction. WE CARE ABOUT YOU!!!   We will schedule you for a visit with the neurologist to do PNCV testing which we'll check for carpal tunnel syndrome and could explain some of the symptoms her having with your hands Please check some home blood pressures and bring readings back and get a BMP in about 4 weeks Watch the sodium intake We will add additional tests the blood work that has already been done and these tests are B12 level and thyroid profile to see if that could explain some of your fatigue and symptoms that you're having. Remember to use the [place] ear wax in the left ear canal Remember to take some allergy medicine if you have some at home and also use Flonase more regularly for the nasal congestion Drink plenty of fluids and stay well hydrated    Arrie Senate MD

## 2017-05-09 NOTE — Addendum Note (Signed)
Addended by: Zannie Cove on: 05/09/2017 10:06 AM   Modules accepted: Orders

## 2017-05-11 LAB — THYROID PANEL WITH TSH
Free Thyroxine Index: 1.9 (ref 1.2–4.9)
T3 Uptake Ratio: 25 % (ref 24–39)
T4, Total: 7.5 ug/dL (ref 4.5–12.0)
TSH: 1.71 u[IU]/mL (ref 0.450–4.500)

## 2017-05-11 LAB — SPECIMEN STATUS REPORT

## 2017-05-11 LAB — VITAMIN B12: Vitamin B-12: 1880 pg/mL — ABNORMAL HIGH (ref 232–1245)

## 2017-05-25 ENCOUNTER — Other Ambulatory Visit: Payer: Medicare Other

## 2017-05-25 DIAGNOSIS — Z1212 Encounter for screening for malignant neoplasm of rectum: Secondary | ICD-10-CM

## 2017-05-29 LAB — FECAL OCCULT BLOOD, IMMUNOCHEMICAL: Fecal Occult Bld: NEGATIVE

## 2017-06-04 ENCOUNTER — Other Ambulatory Visit: Payer: Self-pay | Admitting: Family Medicine

## 2017-06-06 ENCOUNTER — Ambulatory Visit: Payer: Self-pay | Admitting: *Deleted

## 2017-06-06 ENCOUNTER — Ambulatory Visit (INDEPENDENT_AMBULATORY_CARE_PROVIDER_SITE_OTHER): Payer: Medicare Other

## 2017-06-06 DIAGNOSIS — Z23 Encounter for immunization: Secondary | ICD-10-CM

## 2017-06-12 DIAGNOSIS — L820 Inflamed seborrheic keratosis: Secondary | ICD-10-CM | POA: Diagnosis not present

## 2017-06-12 DIAGNOSIS — L570 Actinic keratosis: Secondary | ICD-10-CM | POA: Diagnosis not present

## 2017-06-19 ENCOUNTER — Encounter: Payer: Medicare Other | Admitting: Neurology

## 2017-07-17 ENCOUNTER — Telehealth: Payer: Self-pay | Admitting: Family Medicine

## 2017-07-17 NOTE — Telephone Encounter (Signed)
We do not usually treat diarrhea with antibiotics.  The patient should pick up some Imodium over-the-counter and take 1 3-4 times daily for the next day or so and change her diet for the next 24 hours to clear liquids.  The second 24-hour period of time she should do full liquids.  The third 24-hour period of time a bland diet.  The next 3 days she should avoid caffeine milk cheese ice cream and dairy products.  7-Up and ginger ale are good.

## 2017-07-17 NOTE — Telephone Encounter (Signed)
Pt aware - called with recommendations

## 2017-09-19 DIAGNOSIS — R69 Illness, unspecified: Secondary | ICD-10-CM | POA: Diagnosis not present

## 2017-10-26 DIAGNOSIS — R69 Illness, unspecified: Secondary | ICD-10-CM | POA: Diagnosis not present

## 2017-10-30 ENCOUNTER — Other Ambulatory Visit: Payer: Medicare HMO

## 2017-10-30 DIAGNOSIS — I10 Essential (primary) hypertension: Secondary | ICD-10-CM

## 2017-10-30 DIAGNOSIS — E7800 Pure hypercholesterolemia, unspecified: Secondary | ICD-10-CM

## 2017-10-30 DIAGNOSIS — E559 Vitamin D deficiency, unspecified: Secondary | ICD-10-CM | POA: Diagnosis not present

## 2017-10-31 ENCOUNTER — Encounter: Payer: Self-pay | Admitting: Family Medicine

## 2017-10-31 DIAGNOSIS — N183 Chronic kidney disease, stage 3 unspecified: Secondary | ICD-10-CM | POA: Insufficient documentation

## 2017-10-31 LAB — BMP8+EGFR
BUN/Creatinine Ratio: 14 (ref 12–28)
BUN: 21 mg/dL (ref 8–27)
CO2: 27 mmol/L (ref 20–29)
Calcium: 10.2 mg/dL (ref 8.7–10.3)
Chloride: 99 mmol/L (ref 96–106)
Creatinine, Ser: 1.48 mg/dL — ABNORMAL HIGH (ref 0.57–1.00)
GFR calc Af Amer: 39 mL/min/{1.73_m2} — ABNORMAL LOW
GFR calc non Af Amer: 34 mL/min/{1.73_m2} — ABNORMAL LOW
Glucose: 107 mg/dL — ABNORMAL HIGH (ref 65–99)
Potassium: 4.3 mmol/L (ref 3.5–5.2)
Sodium: 141 mmol/L (ref 134–144)

## 2017-10-31 LAB — CBC WITH DIFFERENTIAL/PLATELET
Basophils Absolute: 0 10*3/uL (ref 0.0–0.2)
Basos: 1 %
EOS (ABSOLUTE): 0.4 10*3/uL (ref 0.0–0.4)
Eos: 7 %
Hematocrit: 38 % (ref 34.0–46.6)
Hemoglobin: 12.9 g/dL (ref 11.1–15.9)
Immature Grans (Abs): 0 10*3/uL (ref 0.0–0.1)
Immature Granulocytes: 0 %
Lymphocytes Absolute: 2.3 10*3/uL (ref 0.7–3.1)
Lymphs: 42 %
MCH: 30.4 pg (ref 26.6–33.0)
MCHC: 33.9 g/dL (ref 31.5–35.7)
MCV: 89 fL (ref 79–97)
Monocytes Absolute: 0.6 10*3/uL (ref 0.1–0.9)
Monocytes: 11 %
Neutrophils Absolute: 2.1 10*3/uL (ref 1.4–7.0)
Neutrophils: 39 %
Platelets: 229 10*3/uL (ref 150–379)
RBC: 4.25 x10E6/uL (ref 3.77–5.28)
RDW: 14.1 % (ref 12.3–15.4)
WBC: 5.4 10*3/uL (ref 3.4–10.8)

## 2017-10-31 LAB — LIPID PANEL
Chol/HDL Ratio: 3.2 ratio (ref 0.0–4.4)
Cholesterol, Total: 143 mg/dL (ref 100–199)
HDL: 45 mg/dL
LDL Calculated: 66 mg/dL (ref 0–99)
Triglycerides: 159 mg/dL — ABNORMAL HIGH (ref 0–149)
VLDL Cholesterol Cal: 32 mg/dL (ref 5–40)

## 2017-10-31 LAB — HEPATIC FUNCTION PANEL
ALT: 21 [IU]/L (ref 0–32)
AST: 26 [IU]/L (ref 0–40)
Albumin: 4 g/dL (ref 3.5–4.8)
Alkaline Phosphatase: 46 [IU]/L (ref 39–117)
Bilirubin Total: 0.4 mg/dL (ref 0.0–1.2)
Bilirubin, Direct: 0.1 mg/dL (ref 0.00–0.40)
Total Protein: 6.9 g/dL (ref 6.0–8.5)

## 2017-10-31 LAB — VITAMIN D 25 HYDROXY (VIT D DEFICIENCY, FRACTURES): Vit D, 25-Hydroxy: 66.2 ng/mL (ref 30.0–100.0)

## 2017-11-01 ENCOUNTER — Telehealth: Payer: Self-pay | Admitting: Family Medicine

## 2017-11-01 DIAGNOSIS — R69 Illness, unspecified: Secondary | ICD-10-CM | POA: Diagnosis not present

## 2017-11-07 ENCOUNTER — Ambulatory Visit (INDEPENDENT_AMBULATORY_CARE_PROVIDER_SITE_OTHER): Payer: Medicare HMO | Admitting: Family Medicine

## 2017-11-07 ENCOUNTER — Ambulatory Visit (INDEPENDENT_AMBULATORY_CARE_PROVIDER_SITE_OTHER): Payer: Medicare HMO

## 2017-11-07 ENCOUNTER — Encounter: Payer: Self-pay | Admitting: Family Medicine

## 2017-11-07 VITALS — BP 113/67 | HR 77 | Temp 96.6°F | Ht 63.0 in | Wt 153.0 lb

## 2017-11-07 DIAGNOSIS — J301 Allergic rhinitis due to pollen: Secondary | ICD-10-CM

## 2017-11-07 DIAGNOSIS — L84 Corns and callosities: Secondary | ICD-10-CM

## 2017-11-07 DIAGNOSIS — E559 Vitamin D deficiency, unspecified: Secondary | ICD-10-CM

## 2017-11-07 DIAGNOSIS — Z780 Asymptomatic menopausal state: Secondary | ICD-10-CM | POA: Diagnosis not present

## 2017-11-07 DIAGNOSIS — A084 Viral intestinal infection, unspecified: Secondary | ICD-10-CM | POA: Diagnosis not present

## 2017-11-07 DIAGNOSIS — R8290 Unspecified abnormal findings in urine: Secondary | ICD-10-CM

## 2017-11-07 DIAGNOSIS — I10 Essential (primary) hypertension: Secondary | ICD-10-CM

## 2017-11-07 DIAGNOSIS — N183 Chronic kidney disease, stage 3 unspecified: Secondary | ICD-10-CM

## 2017-11-07 DIAGNOSIS — Z13820 Encounter for screening for osteoporosis: Secondary | ICD-10-CM

## 2017-11-07 DIAGNOSIS — K2100 Gastro-esophageal reflux disease with esophagitis, without bleeding: Secondary | ICD-10-CM

## 2017-11-07 DIAGNOSIS — E7800 Pure hypercholesterolemia, unspecified: Secondary | ICD-10-CM | POA: Diagnosis not present

## 2017-11-07 LAB — URINALYSIS, COMPLETE
Bilirubin, UA: NEGATIVE
Glucose, UA: NEGATIVE
Ketones, UA: NEGATIVE
Nitrite, UA: NEGATIVE
Protein, UA: NEGATIVE
Specific Gravity, UA: 1.015 (ref 1.005–1.030)
Urobilinogen, Ur: 0.2 mg/dL (ref 0.2–1.0)
pH, UA: 5.5 (ref 5.0–7.5)

## 2017-11-07 LAB — MICROSCOPIC EXAMINATION
Renal Epithel, UA: NONE SEEN /HPF
WBC, UA: >30 /HPF — AB

## 2017-11-07 MED ORDER — HYDROCHLOROTHIAZIDE 25 MG PO TABS: 25.0000 mg | ORAL_TABLET | Freq: Every day | ORAL | 3 refills | Status: DC

## 2017-11-07 MED ORDER — RANITIDINE HCL 150 MG PO TABS: 150.0000 mg | ORAL_TABLET | Freq: Two times a day (BID) | ORAL | 3 refills | Status: DC

## 2017-11-07 NOTE — Progress Notes (Signed)
Subjective:    Patient ID: Kimberly Suarez, female    DOB: 02-Feb-1941, 77 y.o.   MRN: [id number]  HPI Pt here for follow up and management of chronic medical problems which includes hypertension and hyperlipidemia. She is taking medication regularly.  The patient is doing fairly well overall and is requesting refills on her HCTZ and Zantac.  She has had blood work done and this will be reviewed with her during the visit today.  She does complain of a foul-smelling odor with her urine.  She will get a DEXA scan today and a chest x-ray and we will review her lab work with her today.  The CBC has a normal white blood cell count with a good hemoglobin and adequate platelet count.  The blood sugar was slightly elevated at 107 and the creatinine remains slightly elevated at 1.48.  All of the electrolytes including potassium are good.  Cholesterol numbers had an LDL C that was excellent at 66 with a good cholesterol at 45 and an elevated triglyceride at 159.  All liver function tests were normal.  The vitamin D level was good at 66.2.  The patient is pleasant and alert and denies any chest pain or shortness of breath.  She has had some increased heartburn and indigestion and has not been taking her Zantac regularly as she should and he she will get back on this.  Her loose stool problem from the colitis has been stable recently and her bowel movements are formed in the normal color.  She has not had any burning or pain with voiding just the odor.  She does indicate she does not drink enough water.  She also complains of a corn between the fourth and fifth toes on the left foot.    Patient Active Problem List   Diagnosis Date Noted  . Chronic kidney disease (CKD) stage G3b/A1, moderately decreased glomerular filtration rate (GFR) between 30-44 mL/min/1.73 square meter and albuminuria creatinine ratio less than 30 mg/g (HCC) 10/31/2017  . Diarrhea 06/19/2016  . Pre-diabetes 06/01/2014  . Renal insufficiency  11/18/2013  . Osteoarthritis of left knee 11/18/2013  . HTN (hypertension) 06/26/2013  . Microscopic colitis 10/30/2012  . Hyperlipidemia 10/30/2012  . Unspecified constipation 07/10/2011  . Dysphagia, unspecified(787.20) 07/10/2011  . Diverticulosis of colon (without mention of hemorrhage) 07/10/2011  . Special screening for malignant neoplasms, colon 07/10/2011  . GERD with stricture 07/10/2011   Outpatient Encounter Medications as of 11/07/2017  Medication Sig  . AMBULATORY NON FORMULARY MEDICATION Medication Name: Omega XL, take 2 capsules daily for joints, etc  . aspirin 81 MG tablet Take 81 mg by mouth daily.    Marland Kitchen atorvastatin (LIPITOR) 80 MG tablet TAKE 1 TABLET BY MOUTH  DAILY  . b complex vitamins capsule Take 1 capsule by mouth daily.  . Calcium Carbonate-Simethicone (TUMS PLUS PO) Take 1 tablet by mouth as needed.  . enalapril (VASOTEC) 10 MG tablet TAKE 1 TABLET BY MOUTH TWO  TIMES DAILY  . Glucosamine-Chondroitin (GLUCOSAMINE CHONDR COMPLEX PO) Take 1 tablet by mouth 2 (two) times daily.   . hydrochlorothiazide (HYDRODIURIL) 25 MG tablet TAKE 1 TABLET BY MOUTH  DAILY  . Multiple Vitamins-Minerals (MULTIVITAMIN ADULT PO) Take 1 tablet by mouth daily.  . Omega-3 Fatty Acids (FISH OIL) 1000 MG CAPS Take 1 capsule by mouth daily.   . ranitidine (ZANTAC) 150 MG tablet Take 1 tablet (150 mg total) by mouth 2 (two) times daily.   No facility-administered encounter medications on  file as of 11/07/2017.       Review of Systems  Constitutional: Negative.   HENT: Negative.   Eyes: Negative.   Respiratory: Negative.   Cardiovascular: Negative.   Gastrointestinal: Negative.   Endocrine: Negative.   Genitourinary: Negative.        Strong urine odor  Musculoskeletal: Negative.   Skin: Negative.   Allergic/Immunologic: Negative.   Neurological: Negative.   Hematological: Negative.   Psychiatric/Behavioral: Negative.        Objective:   Physical Exam  Constitutional: She  is oriented to person, place, and time. She appears well-developed and well-nourished. No distress.  The patient is pleasant and alert and in no acute distress.  HENT:  Head: Normocephalic and atraumatic.  Right Ear: External ear normal.  Left Ear: External ear normal.  Mouth/Throat: Oropharynx is clear and moist. No oropharyngeal exudate.  Nasal turbinate congestion bilaterally  Eyes: Conjunctivae and EOM are normal. Pupils are equal, round, and reactive to light. Right eye exhibits no discharge. Left eye exhibits no discharge. No scleral icterus.  Neck: Normal range of motion. Neck supple. No thyromegaly present.  No bruits or thyromegaly or anterior cervical adenopathy  Cardiovascular: Normal rate, regular rhythm, normal heart sounds and intact distal pulses.  No murmur heard. Heart has a regular rate and rhythm at 72/min  Pulmonary/Chest: Effort normal and breath sounds normal. No respiratory distress. She has no wheezes. She has no rales.  Clear anteriorly and posteriorly  Abdominal: Soft. Bowel sounds are normal. She exhibits no mass. There is no tenderness. There is no rebound and no guarding.  Slight suprapubic tenderness but no liver or spleen enlargement masses or bruits  Musculoskeletal: Normal range of motion. She exhibits no edema.  There is a corn-like lesion on the fifth toe of the left foot between the fourth and fifth toes.  Cryotherapy will be performed and patient tolerated the procedure well.  She understands to keep a piece of gauze over this between her toes for a few days and keep the area clean with alcohol.  Lymphadenopathy:    She has no cervical adenopathy.  Neurological: She is alert and oriented to person, place, and time. She has normal reflexes. No cranial nerve deficit.  Skin: Skin is warm and dry. No rash noted.  Psychiatric: She has a normal mood and affect. Her behavior is normal. Judgment and thought content normal.  Nursing note and vitals reviewed.  BP  113/67 (BP Location: Right Wrist)   Pulse 77   Temp (!) 96.6 F (35.9 C) (Oral)   Ht 5\' 3"  (1.6 m)   Wt 153 lb (69.4 kg)   BMI 27.10 kg/m   Cryotherapy to corn of fifth toe.  Patient tolerated procedure well.      Assessment & Plan:  1. Pure hypercholesterolemia -Continue with current treatment and aggressive therapeutic lifestyle changes.  Triglycerides were elevated slightly and she will try to do better with her diet. - DG Chest 2 View; Future  2. Essential hypertension -Blood pressure is good today and she will continue with current treatment - DG Chest 2 View; Future  3. Vitamin D deficiency -Vitamin D level was good and she will continue with current treatment - DG [facility] DEXA; Future  4. Gastroesophageal reflux disease with esophagitis -She was encouraged to take her ranitidine and stay on this instead of taking it only as needed because of the heartburn and indigestion. - ranitidine (ZANTAC) 150 MG tablet; Take 1 tablet (150 mg total) by mouth  2 (two) times daily.  Dispense: 180 tablet; Refill: 3  5. Abnormal urine odor -She actually does not have any burning with voiding but she does have some slight suprapubic tenderness. - Urinalysis, Complete - Urine Culture  6. Screening for osteoporosis - DG [facility] DEXA; Future  7. Postmenopausal - DG [facility] DEXA; Future  8. Viral gastroenteritis -She takes the ranitidine for reflux problems and she will take this more regularly. - ranitidine (ZANTAC) 150 MG tablet; Take 1 tablet (150 mg total) by mouth 2 (two) times daily.  Dispense: 180 tablet; Refill: 3  9. Stage 3 chronic kidney disease (HCC) -The creatinine remains slightly more elevated and she understands to stay away from all NSAIDs.  10. Corn of toe -Therapy to corn of fifth toe of left foot  11. Seasonal allergic rhinitis due to pollen -Continue with nasal saline and use Flonase if congestion becomes bothersome.  Meds ordered this encounter  Medications  .  hydrochlorothiazide (HYDRODIURIL) 25 MG tablet    Sig: Take 1 tablet (25 mg total) by mouth daily.    Dispense:  90 tablet    Refill:  3  . ranitidine (ZANTAC) 150 MG tablet    Sig: Take 1 tablet (150 mg total) by mouth 2 (two) times daily.    Dispense:  180 tablet    Refill:  3   Patient Instructions                       Medicare Annual Wellness Visit  [facility] and the medical providers at [facility] strive to bring you the best medical care.  In doing so we not only want to address your current medical conditions and concerns but also to detect new conditions early and prevent illness, disease and health-related problems.    Medicare offers a yearly Wellness Visit which allows our clinical staff to assess your need for preventative services including immunizations, lifestyle education, counseling to decrease risk of preventable diseases and screening for fall risk and other medical concerns.    This visit is provided free of charge (no copay) for all Medicare recipients. The clinical pharmacists at [facility] have begun to conduct these Wellness Visits which will also include a thorough review of all your medications.    As you primary medical provider recommend that you make an appointment for your Annual Wellness Visit if you have not done so already this year.  You may set up this appointment before you leave today or you may call back ([phone]) and schedule an appointment.  Please make sure when you call that you mention that you are scheduling your Annual Wellness Visit with the clinical pharmacist so that the appointment may be made for the proper length of time.     Continue current medications. Continue good therapeutic lifestyle changes which include good diet and exercise. Fall precautions discussed with patient. If an FOBT was given today- please return it to our front desk. If you are over 64 years old - you may need Prevnar  19 or the adult Pneumonia vaccine.  **Flu shots are available--- please call and schedule a FLU-CLINIC appointment**  After your visit with [place] today you will receive a survey in the mail or online from [employer] regarding your care with [place]. Please take a moment to fill this out. Your feedback is very important to [place] as you can help [place] better understand your patient needs as well as improve your  experience and satisfaction. WE CARE ABOUT YOU!!!   Drink more water Keep a small piece of gauze between the fourth and fifth toes of the left foot and keep the area clean with some alcohol after we freeze it today. Take your ranitidine or Zantac regularly and do not discontinue this Take the Imodium as recommended by Dr. Hilarie Fredrickson if needed for loose bowel movements We will call with the results of the chest x-ray and DEXA scan as soon as they become available    Arrie Senate MD

## 2017-11-07 NOTE — Patient Instructions (Addendum)
Medicare Annual Wellness Visit  [facility] and the medical providers at [facility] strive to bring you the best medical care.  In doing so we not only want to address your current medical conditions and concerns but also to detect new conditions early and prevent illness, disease and health-related problems.    Medicare offers a yearly Wellness Visit which allows our clinical staff to assess your need for preventative services including immunizations, lifestyle education, counseling to decrease risk of preventable diseases and screening for fall risk and other medical concerns.    This visit is provided free of charge (no copay) for all Medicare recipients. The clinical pharmacists at [facility] have begun to conduct these Wellness Visits which will also include a thorough review of all your medications.    As you primary medical provider recommend that you make an appointment for your Annual Wellness Visit if you have not done so already this year.  You may set up this appointment before you leave today or you may call back ([phone]) and schedule an appointment.  Please make sure when you call that you mention that you are scheduling your Annual Wellness Visit with the clinical pharmacist so that the appointment may be made for the proper length of time.     Continue current medications. Continue good therapeutic lifestyle changes which include good diet and exercise. Fall precautions discussed with patient. If an FOBT was given today- please return it to our front desk. If you are over 68 years old - you may need Prevnar 75 or the adult Pneumonia vaccine.  **Flu shots are available--- please call and schedule a FLU-CLINIC appointment**  After your visit with [place] today you will receive a survey in the mail or online from [employer] regarding your care with [place]. Please take a moment to fill this out. Your feedback is very  important to [place] as you can help [place] better understand your patient needs as well as improve your experience and satisfaction. WE CARE ABOUT YOU!!!   Drink more water Keep a small piece of gauze between the fourth and fifth toes of the left foot and keep the area clean with some alcohol after we freeze it today. Take your ranitidine or Zantac regularly and do not discontinue this Take the Imodium as recommended by Dr. Hilarie Fredrickson if needed for loose bowel movements We will call with the results of the chest x-ray and DEXA scan as soon as they become available

## 2017-11-08 DIAGNOSIS — Z780 Asymptomatic menopausal state: Secondary | ICD-10-CM | POA: Diagnosis not present

## 2017-11-08 LAB — URINE CULTURE: Organism ID, Bacteria: NO GROWTH

## 2018-01-03 DIAGNOSIS — Z961 Presence of intraocular lens: Secondary | ICD-10-CM | POA: Diagnosis not present

## 2018-01-03 DIAGNOSIS — H524 Presbyopia: Secondary | ICD-10-CM | POA: Diagnosis not present

## 2018-01-03 DIAGNOSIS — H52203 Unspecified astigmatism, bilateral: Secondary | ICD-10-CM | POA: Diagnosis not present

## 2018-01-03 DIAGNOSIS — H26493 Other secondary cataract, bilateral: Secondary | ICD-10-CM | POA: Diagnosis not present

## 2018-01-16 DIAGNOSIS — H26492 Other secondary cataract, left eye: Secondary | ICD-10-CM | POA: Diagnosis not present

## 2018-03-04 ENCOUNTER — Encounter: Payer: Self-pay | Admitting: Family Medicine

## 2018-03-04 ENCOUNTER — Ambulatory Visit (INDEPENDENT_AMBULATORY_CARE_PROVIDER_SITE_OTHER): Payer: Medicare HMO | Admitting: Family Medicine

## 2018-03-04 VITALS — BP 107/59 | HR 88 | Temp 98.2°F | Ht 63.0 in | Wt 146.0 lb

## 2018-03-04 DIAGNOSIS — N3000 Acute cystitis without hematuria: Secondary | ICD-10-CM | POA: Diagnosis not present

## 2018-03-04 DIAGNOSIS — R1084 Generalized abdominal pain: Secondary | ICD-10-CM | POA: Diagnosis not present

## 2018-03-04 LAB — MICROSCOPIC EXAMINATION
Epithelial Cells (non renal): >10 /HPF — AB (ref 0–10)
Renal Epithel, UA: NONE SEEN /HPF
WBC, UA: >30 /HPF — AB (ref 0–5)

## 2018-03-04 LAB — URINALYSIS, COMPLETE
Bilirubin, UA: NEGATIVE
Glucose, UA: NEGATIVE
Nitrite, UA: NEGATIVE
Specific Gravity, UA: >1.03 — ABNORMAL HIGH (ref 1.005–1.030)
Urobilinogen, Ur: 0.2 mg/dL (ref 0.2–1.0)
pH, UA: 5.5 (ref 5.0–7.5)

## 2018-03-04 MED ORDER — SULFAMETHOXAZOLE-TRIMETHOPRIM 800-160 MG PO TABS: 1.0000 | ORAL_TABLET | Freq: Two times a day (BID) | ORAL | 0 refills | Status: DC

## 2018-03-04 NOTE — Progress Notes (Signed)
BP (!) 107/59   Pulse 88   Temp 98.2 F (36.8 C) (Oral)   Ht 5\' 3"  (1.6 m)   Wt 146 lb (66.2 kg)   BMI 25.86 kg/m    Subjective:    Patient ID: Kimberly Suarez, female    DOB: 23-Nov-1940, 77 y.o.   MRN: [id number]  HPI: Kimberly Suarez is a 77 y.o. female presenting on 03/04/2018 for Abdominal pain, fever yesterday, nauseous, weak   HPI Lower abdominal pain Patient complains of lower abdominal pain and fever and achiness and nausea and weakness that is been going on for the past 3 days.  She is also had some nasal congestion as well but denies any sinus pressure headaches or ear pressure or sore throat.  She has been having urinary frequency and had a fever 100.9 yesterday.  She has been having urinary frequency as well.  Relevant past medical, surgical, family and social history reviewed and updated as indicated. Interim medical history since our last visit reviewed. Allergies and medications reviewed and updated.  Review of Systems  Constitutional: Negative for chills and fever.  Eyes: Negative for visual disturbance.  Respiratory: Negative for chest tightness and shortness of breath.   Cardiovascular: Negative for chest pain and leg swelling.  Gastrointestinal: Positive for abdominal pain and nausea. Negative for diarrhea and vomiting.  Genitourinary: Positive for frequency and urgency. Negative for difficulty urinating, dysuria and flank pain.  Musculoskeletal: Negative for back pain and gait problem.  Skin: Negative for rash.  Neurological: Positive for weakness. Negative for light-headedness, numbness and headaches.  Psychiatric/Behavioral: Negative for agitation and behavioral problems.  All other systems reviewed and are negative.   Per HPI unless specifically indicated above   Allergies as of 03/04/2018   No Known Allergies     Medication List        Accurate as of 03/04/18  2:41 PM. Always use your most recent med list.          AMBULATORY NON FORMULARY  MEDICATION Medication Name: Omega XL, take 2 capsules daily for joints, etc   aspirin 81 MG tablet Take 81 mg by mouth daily.   atorvastatin 80 MG tablet Commonly known as:  LIPITOR TAKE 1 TABLET BY MOUTH  DAILY   b complex vitamins capsule Take 1 capsule by mouth daily.   enalapril 10 MG tablet Commonly known as:  VASOTEC TAKE 1 TABLET BY MOUTH TWO  TIMES DAILY   Fish Oil 1000 MG Caps Take 1 capsule by mouth daily.   GLUCOSAMINE CHONDR COMPLEX PO Take 1 tablet by mouth 2 (two) times daily.   hydrochlorothiazide 25 MG tablet Commonly known as:  HYDRODIURIL Take 1 tablet (25 mg total) by mouth daily.   MULTIVITAMIN ADULT PO Take 1 tablet by mouth daily.   ranitidine 150 MG tablet Commonly known as:  ZANTAC Take 1 tablet (150 mg total) by mouth 2 (two) times daily.   TUMS PLUS PO Take 1 tablet by mouth as needed.          Objective:    BP (!) 107/59   Pulse 88   Temp 98.2 F (36.8 C) (Oral)   Ht 5\' 3"  (1.6 m)   Wt 146 lb (66.2 kg)   BMI 25.86 kg/m   Wt Readings from Last 3 Encounters:  03/04/18 146 lb (66.2 kg)  11/07/17 153 lb (69.4 kg)  05/09/17 153 lb (69.4 kg)    Physical Exam  Constitutional: She is oriented to person, place,  and time. She appears well-developed and well-nourished. No distress.  Eyes: Pupils are equal, round, and reactive to light. Conjunctivae and EOM are normal.  Cardiovascular: Normal rate, regular rhythm, normal heart sounds and intact distal pulses.  No murmur heard. Pulmonary/Chest: Effort normal and breath sounds normal. No respiratory distress. She has no wheezes.  Abdominal: Soft. Bowel sounds are normal. She exhibits no distension and no mass. There is tenderness in the suprapubic area. There is no rigidity, no rebound, no guarding and no CVA tenderness.  Musculoskeletal: Normal range of motion. She exhibits no edema or tenderness.  Neurological: She is alert and oriented to person, place, and time. Coordination normal.    Skin: Skin is warm and dry. No rash noted. She is not diaphoretic.  Psychiatric: She has a normal mood and affect. Her behavior is normal.  Nursing note and vitals reviewed.   Urinalysis: Greater than 30 WBCs, 11-30 RBCs, greater than 10 epithelial cells, many bacteria    Assessment & Plan:   Problem List Items Addressed This Visit    None    Visit Diagnoses    Acute cystitis without hematuria    -  Primary   Relevant Medications   sulfamethoxazole-trimethoprim (BACTRIM DS,SEPTRA DS) 800-160 MG tablet   Other Relevant Orders   Urinalysis, Complete       Follow up plan: Return if symptoms worsen or fail to improve.  Counseling provided for all of the vaccine components Orders Placed This Encounter  Procedures  . Urinalysis, Complete    Caryl Pina, MD [facility] 03/04/2018, 2:41 PM

## 2018-03-25 IMAGING — DX DG CHEST 2V
2 series · 2 of 2 positions shown · non-contrast
Comparison: 11/22/2015.

CLINICAL DATA: Hypertension.

EXAM:
CHEST - 2 VIEW

[chest pa]
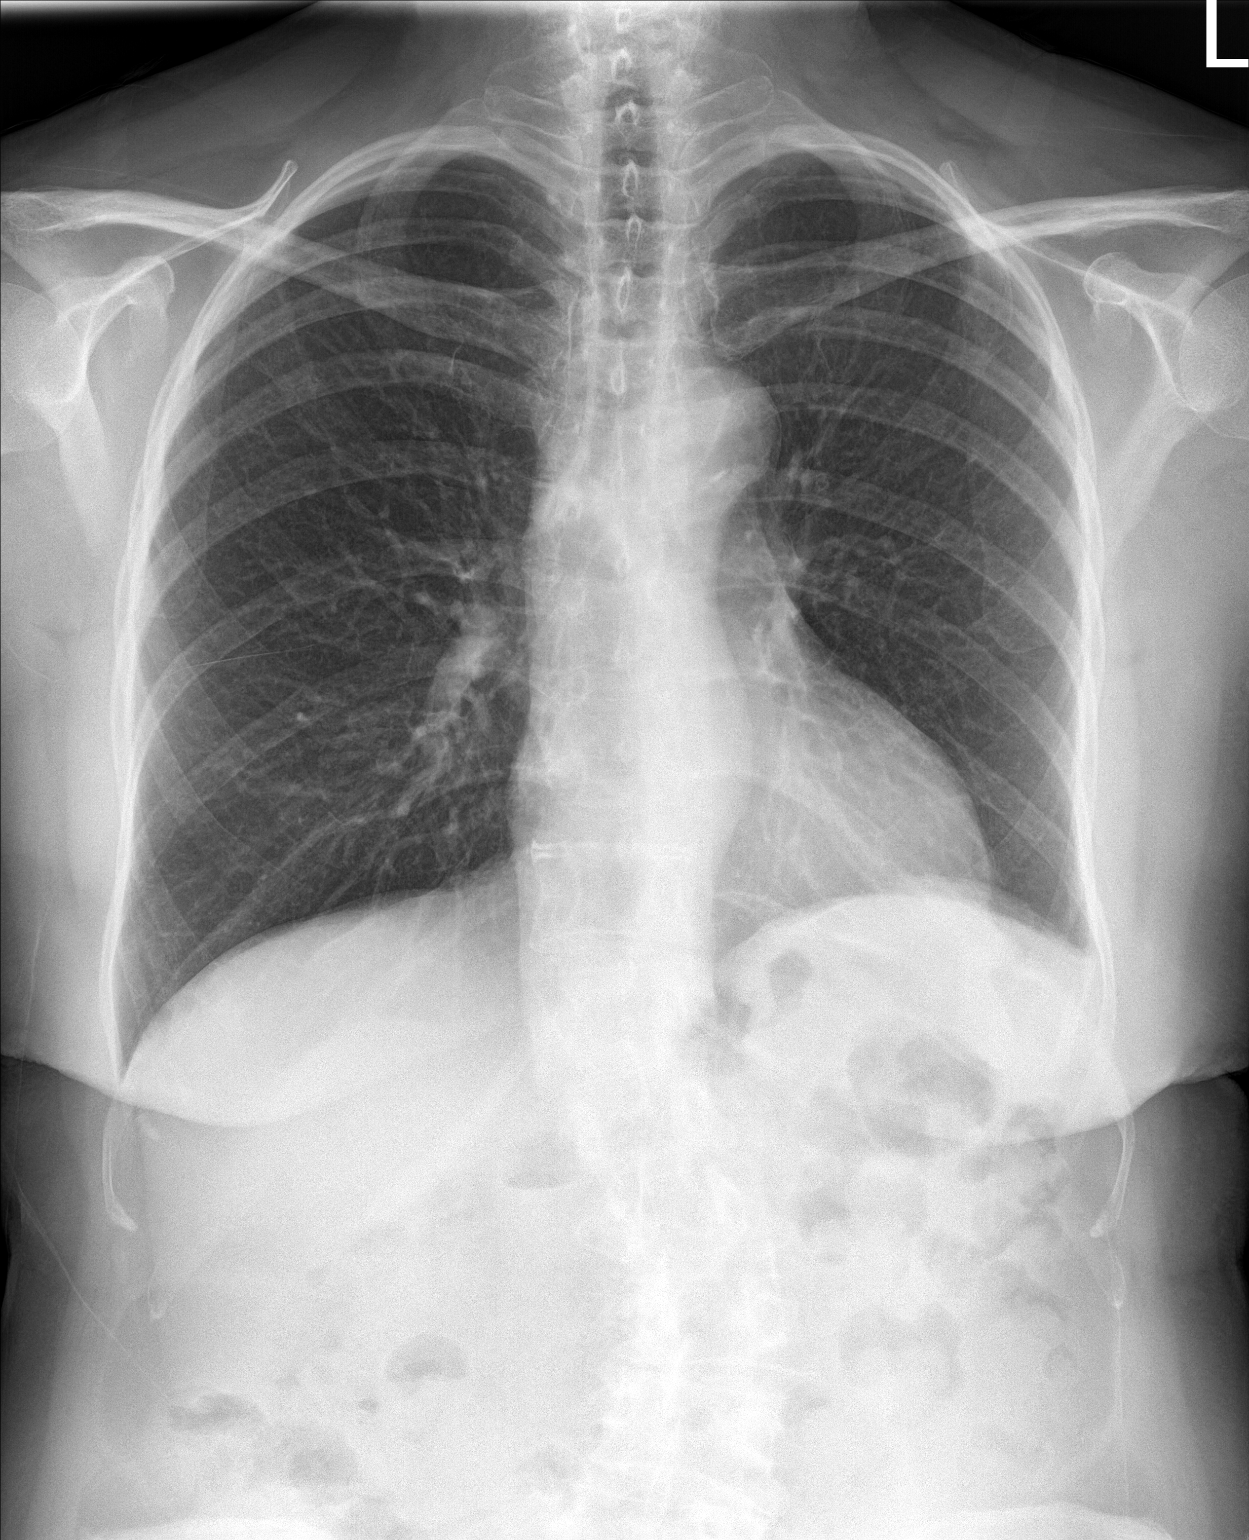

[chest lat]
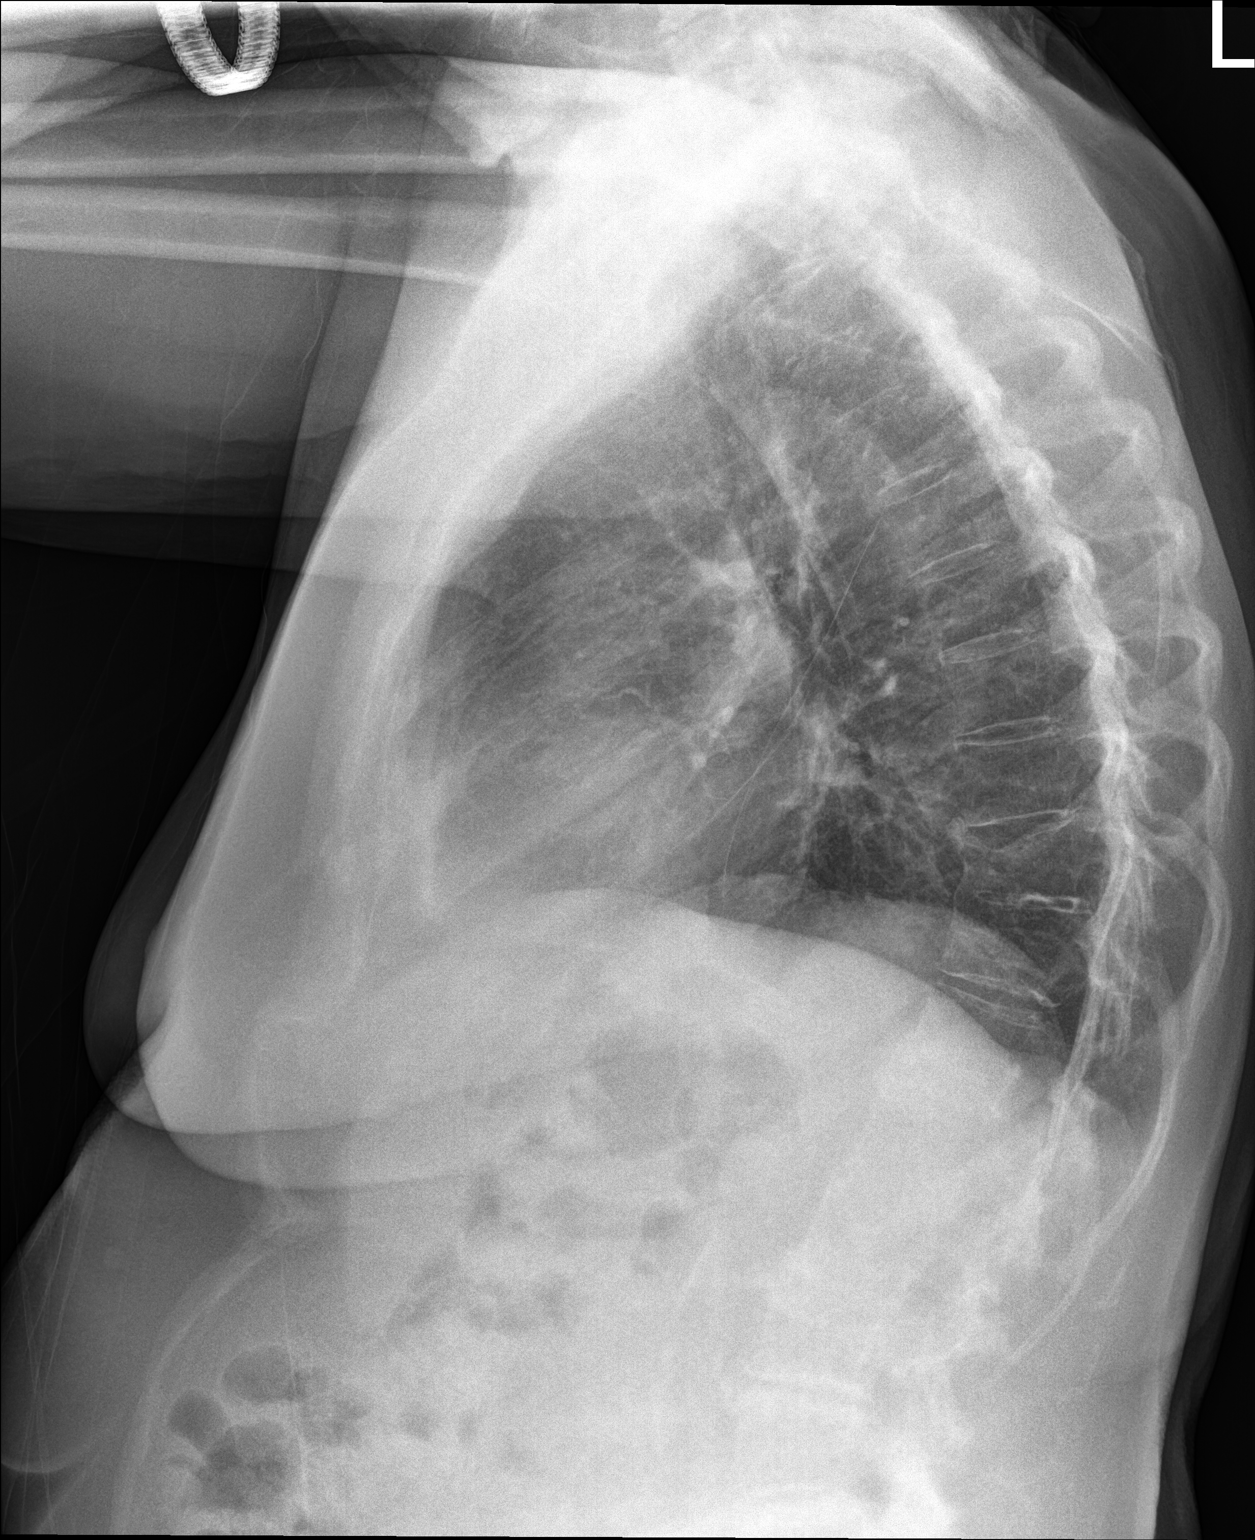

[2 of 2 positions shown; findings below may reference images not displayed]

FINDINGS: Mediastinum and hilar structures normal. Lungs are clear. Left base
pleural thickening consistent scarring again noted. Heart size
normal. Degenerative changes thoracic spine. Diffuse thoracolumbar
spine degenerative changes scoliosis. Stable lower thoracic
vertebral body compression fracture.
IMPRESSION: Left base pleural thickening consistent with scarring again noted.
No acute cardiopulmonary disease.

## 2018-04-10 DIAGNOSIS — Z961 Presence of intraocular lens: Secondary | ICD-10-CM | POA: Diagnosis not present

## 2018-05-03 ENCOUNTER — Other Ambulatory Visit: Payer: Medicare HMO

## 2018-05-03 DIAGNOSIS — E7800 Pure hypercholesterolemia, unspecified: Secondary | ICD-10-CM

## 2018-05-03 DIAGNOSIS — K2100 Gastro-esophageal reflux disease with esophagitis, without bleeding: Secondary | ICD-10-CM

## 2018-05-03 DIAGNOSIS — E559 Vitamin D deficiency, unspecified: Secondary | ICD-10-CM | POA: Diagnosis not present

## 2018-05-03 DIAGNOSIS — I10 Essential (primary) hypertension: Secondary | ICD-10-CM | POA: Diagnosis not present

## 2018-05-04 LAB — BMP8+EGFR
BUN/Creatinine Ratio: 20 (ref 12–28)
BUN: 29 mg/dL — ABNORMAL HIGH (ref 8–27)
CO2: 26 mmol/L (ref 20–29)
Calcium: 9.2 mg/dL (ref 8.7–10.3)
Chloride: 96 mmol/L (ref 96–106)
Creatinine, Ser: 1.43 mg/dL — ABNORMAL HIGH (ref 0.57–1.00)
GFR calc Af Amer: 41 mL/min/{1.73_m2} — ABNORMAL LOW
GFR calc non Af Amer: 35 mL/min/{1.73_m2} — ABNORMAL LOW
Glucose: 92 mg/dL (ref 65–99)
Potassium: 3.8 mmol/L (ref 3.5–5.2)
Sodium: 138 mmol/L (ref 134–144)

## 2018-05-04 LAB — CBC WITH DIFFERENTIAL/PLATELET
Basophils Absolute: 0 10*3/uL (ref 0.0–0.2)
Basos: 1 %
EOS (ABSOLUTE): 0.4 10*3/uL (ref 0.0–0.4)
Eos: 7 %
Hematocrit: 35.2 % (ref 34.0–46.6)
Hemoglobin: 12.1 g/dL (ref 11.1–15.9)
Immature Grans (Abs): 0 10*3/uL (ref 0.0–0.1)
Immature Granulocytes: 0 %
Lymphocytes Absolute: 1.8 10*3/uL (ref 0.7–3.1)
Lymphs: 34 %
MCH: 30.9 pg (ref 26.6–33.0)
MCHC: 34.4 g/dL (ref 31.5–35.7)
MCV: 90 fL (ref 79–97)
Monocytes Absolute: 0.6 10*3/uL (ref 0.1–0.9)
Monocytes: 12 %
Neutrophils Absolute: 2.4 10*3/uL (ref 1.4–7.0)
Neutrophils: 46 %
Platelets: 231 10*3/uL (ref 150–450)
RBC: 3.92 x10E6/uL (ref 3.77–5.28)
RDW: 13.8 % (ref 12.3–15.4)
WBC: 5.2 10*3/uL (ref 3.4–10.8)

## 2018-05-04 LAB — HEPATIC FUNCTION PANEL
ALT: 12 [IU]/L (ref 0–32)
AST: 18 [IU]/L (ref 0–40)
Albumin: 3.9 g/dL (ref 3.5–4.8)
Alkaline Phosphatase: 46 [IU]/L (ref 39–117)
Bilirubin Total: 0.2 mg/dL (ref 0.0–1.2)
Bilirubin, Direct: 0.09 mg/dL (ref 0.00–0.40)
Total Protein: 6.7 g/dL (ref 6.0–8.5)

## 2018-05-04 LAB — LIPID PANEL
Chol/HDL Ratio: 3.9 ratio (ref 0.0–4.4)
Cholesterol, Total: 145 mg/dL (ref 100–199)
HDL: 37 mg/dL — ABNORMAL LOW
LDL Calculated: 78 mg/dL (ref 0–99)
Triglycerides: 152 mg/dL — ABNORMAL HIGH (ref 0–149)
VLDL Cholesterol Cal: 30 mg/dL (ref 5–40)

## 2018-05-04 LAB — VITAMIN D 25 HYDROXY (VIT D DEFICIENCY, FRACTURES): Vit D, 25-Hydroxy: 50.9 ng/mL (ref 30.0–100.0)

## 2018-05-07 ENCOUNTER — Encounter: Payer: Self-pay | Admitting: Family Medicine

## 2018-05-07 ENCOUNTER — Telehealth: Payer: Self-pay | Admitting: Family Medicine

## 2018-05-07 ENCOUNTER — Ambulatory Visit (INDEPENDENT_AMBULATORY_CARE_PROVIDER_SITE_OTHER): Payer: Medicare HMO | Admitting: Family Medicine

## 2018-05-07 VITALS — BP 101/66 | HR 77 | Temp 97.1°F | Ht 63.0 in | Wt 150.0 lb

## 2018-05-07 DIAGNOSIS — K2100 Gastro-esophageal reflux disease with esophagitis, without bleeding: Secondary | ICD-10-CM

## 2018-05-07 DIAGNOSIS — N183 Chronic kidney disease, stage 3 unspecified: Secondary | ICD-10-CM

## 2018-05-07 DIAGNOSIS — E559 Vitamin D deficiency, unspecified: Secondary | ICD-10-CM

## 2018-05-07 DIAGNOSIS — R300 Dysuria: Secondary | ICD-10-CM

## 2018-05-07 DIAGNOSIS — E7800 Pure hypercholesterolemia, unspecified: Secondary | ICD-10-CM

## 2018-05-07 DIAGNOSIS — I10 Essential (primary) hypertension: Secondary | ICD-10-CM | POA: Diagnosis not present

## 2018-05-07 DIAGNOSIS — H6121 Impacted cerumen, right ear: Secondary | ICD-10-CM | POA: Diagnosis not present

## 2018-05-07 LAB — MICROSCOPIC EXAMINATION
Epithelial Cells (non renal): >10 /HPF — AB (ref 0–10)
WBC, UA: >30 /HPF — AB (ref 0–5)

## 2018-05-07 LAB — URINALYSIS, COMPLETE
Bilirubin, UA: NEGATIVE
Glucose, UA: NEGATIVE
Nitrite, UA: NEGATIVE
Protein, UA: NEGATIVE
Specific Gravity, UA: 1.015 (ref 1.005–1.030)
Urobilinogen, Ur: 0.2 mg/dL (ref 0.2–1.0)
pH, UA: 5 (ref 5.0–7.5)

## 2018-05-07 MED ORDER — ATORVASTATIN CALCIUM 40 MG PO TABS: 40.0000 mg | ORAL_TABLET | Freq: Every day | ORAL | 3 refills | Status: DC

## 2018-05-07 NOTE — Telephone Encounter (Signed)
Noted  

## 2018-05-07 NOTE — Addendum Note (Signed)
Addended by: Zannie Cove on: 05/07/2018 09:22 AM   Modules accepted: Orders

## 2018-05-07 NOTE — Progress Notes (Signed)
Subjective:    Patient ID: Kimberly Suarez, female    DOB: 08-16-1941, 77 y.o.   MRN: [id number]  HPI Pt here for follow up and management of chronic medical problems which includes hypertension and hyperlipidemia. She is taking medication regularly.  The patient is doing well overall but does complain of some fatigue and she attributes this to not taking her B12 by mouth.  She also complains of some dysuria.  She will schedule to get her mammogram as it is due now.  She was given an FOBT to return.  She has had lab work done and this will be reviewed with her during the visit today.  All of her vital signs were stable and her weight is up about 4 pounds from her previous visit.  Cholesterol numbers with traditional lipid testing have an LDL-C cholesterol that is slightly increased but still good and at goal at 78.  The good cholesterol is low and can at 37.  Triglycerides are slightly elevated at 152.  The blood sugar was good at 92.  The creatinine remains elevated at 1.43 and this is similar to past readings.  All of the electrolytes including potassium are good.  The CBC had a normal white blood cell count with a stable hemoglobin at 12.1 and an adequate platelet count.  The vitamin D level remains good at 50.9.  All liver function tests were normal.  Because of the dysuria, we will check a urinalysis today.  We will ask her to restart the B12 that she has been taking and if this continues to be a problem we may need to check a B12 level.  The patient is doing well overall.  She does complain with the dysuria and fatigue.  We will add a thyroid and B12 level to her current blood work and this is because of fatigue and will also get a urine analysis on her.  She recently had an eye exam with Dr. Gershon Crane.  Chest pain pressure tightness or shortness of breath.  She is taking her ranitidine regularly and only has occasional heartburn and she also has occasional bouts may be a couple times a year of loose  bowel movements and takes Imodium over-the-counter.  We will make sure we get a urinalysis before she leaves today.    Patient Active Problem List   Diagnosis Date Noted  . Chronic kidney disease (CKD) stage G3b/A1, moderately decreased glomerular filtration rate (GFR) between 30-44 mL/min/1.73 square meter and albuminuria creatinine ratio less than 30 mg/g (HCC) 10/31/2017  . Diarrhea 06/19/2016  . Pre-diabetes 06/01/2014  . Renal insufficiency 11/18/2013  . Osteoarthritis of left knee 11/18/2013  . HTN (hypertension) 06/26/2013  . Microscopic colitis 10/30/2012  . Hyperlipidemia 10/30/2012  . Unspecified constipation 07/10/2011  . Dysphagia, unspecified(787.20) 07/10/2011  . Diverticulosis of colon (without mention of hemorrhage) 07/10/2011  . Special screening for malignant neoplasms, colon 07/10/2011  . GERD with stricture 07/10/2011   Outpatient Encounter Medications as of 05/07/2018  Medication Sig  . AMBULATORY NON FORMULARY MEDICATION Medication Name: Omega XL, take 2 capsules daily for joints, etc  . aspirin 81 MG tablet Take 81 mg by mouth daily.    Marland Kitchen atorvastatin (LIPITOR) 80 MG tablet TAKE 1 TABLET BY MOUTH  DAILY  . b complex vitamins capsule Take 1 capsule by mouth daily.  . Calcium Carbonate-Simethicone (TUMS PLUS PO) Take 1 tablet by mouth as needed.  . enalapril (VASOTEC) 10 MG tablet TAKE 1 TABLET BY MOUTH TWO  TIMES DAILY  . Glucosamine-Chondroitin (GLUCOSAMINE CHONDR COMPLEX PO) Take 1 tablet by mouth 2 (two) times daily.   . hydrochlorothiazide (HYDRODIURIL) 25 MG tablet Take 1 tablet (25 mg total) by mouth daily.  . Multiple Vitamins-Minerals (MULTIVITAMIN ADULT PO) Take 1 tablet by mouth daily.  . Omega-3 Fatty Acids (FISH OIL) 1000 MG CAPS Take 1 capsule by mouth daily.   . ranitidine (ZANTAC) 150 MG tablet Take 1 tablet (150 mg total) by mouth 2 (two) times daily.  . [DISCONTINUED] sulfamethoxazole-trimethoprim (BACTRIM DS,SEPTRA DS) 800-160 MG tablet Take 1  tablet by mouth 2 (two) times daily.   No facility-administered encounter medications on file as of 05/07/2018.       Review of Systems  Constitutional: Positive for fatigue.  HENT: Negative.   Eyes: Negative.   Respiratory: Negative.   Cardiovascular: Negative.   Gastrointestinal: Negative.   Endocrine: Negative.   Genitourinary: Positive for dysuria.  Musculoskeletal: Negative.   Skin: Negative.   Allergic/Immunologic: Negative.   Neurological: Negative.   Hematological: Negative.   Psychiatric/Behavioral: Negative.        Objective:   Physical Exam  Constitutional: She is oriented to person, place, and time. She appears well-developed and well-nourished. No distress.  The patient is pleasant and alert  HENT:  Head: Normocephalic and atraumatic.  Mouth/Throat: Oropharynx is clear and moist. No oropharyngeal exudate.  Slight nasal turbinate congestion bilaterally.  Ears cerumen bilaterally right more impacted than left  Eyes: Pupils are equal, round, and reactive to light. Conjunctivae and EOM are normal. Right eye exhibits no discharge. Left eye exhibits no discharge. No scleral icterus.  Just recently had eye exam  Neck: Normal range of motion. Neck supple. No thyromegaly present.  No bruits thyromegaly or anterior cervical adenopathy  Cardiovascular: Normal rate, regular rhythm, normal heart sounds and intact distal pulses.  No murmur heard. The heart is regular at 60/min with good pedal pulses  Pulmonary/Chest: Effort normal and breath sounds normal. She has no wheezes. She has no rales.  Clear anteriorly and posteriorly  Abdominal: Soft. Bowel sounds are normal. She exhibits no mass. There is no tenderness. There is no guarding.  No liver or spleen enlargement no epigastric tenderness or suprapubic tenderness no masses no bruits and no inguinal adenopathy  Musculoskeletal: Normal range of motion. She exhibits no edema.  Lymphadenopathy:    She has no cervical  adenopathy.  Neurological: She is alert and oriented to person, place, and time. She has normal reflexes. No cranial nerve deficit.  Reflexes are 1+ and equal bilaterally in the lower extremity  Skin: Skin is warm and dry. No rash noted.  Patient sees dermatologist regularly and there were no worrisome lesions on her back other than seborrheic keratoses.  Psychiatric: She has a normal mood and affect. Her behavior is normal. Judgment and thought content normal.  The patient's mood affect and behavior were normal for her.  Nursing note and vitals reviewed.   BP 101/66 (BP Location: Left Arm)   Pulse 77   Temp (!) 97.1 F (36.2 C) (Oral)   Ht 5\' 3"  (1.6 m)   Wt 150 lb (68 kg)   BMI 26.57 kg/m        Assessment & Plan:  1. Pure hypercholesterolemia -Cholesterol numbers were good and patient will continue with her current treatment of atorvastatin 40 mg daily.  She will try to restart her exercise regimen which had been lax for the past couple of months.    2. Essential hypertension -  The blood pressure is good today and she will continue with current treatment  3. Gastroesophageal reflux disease with esophagitis -Continue with ranitidine and watch diet more closely  4. Vitamin D deficiency -Vitamin D level was good and she will continue with current treatment  5. Dysuria - Urine Culture - Urinalysis, Complete  6. Stage III chronic kidney disease ([place]) -Watch sodium intake and avoid NSAIDs  7. Right ear impacted cerumen -Ear irrigation to remove cerumen  Patient Instructions                       Medicare Annual Wellness Visit  [facility] and the medical providers at [facility] strive to bring you the best medical care.  In doing so we not only want to address your current medical conditions and concerns but also to detect new conditions early and prevent illness, disease and health-related problems.    Medicare offers a yearly Wellness Visit  which allows our clinical staff to assess your need for preventative services including immunizations, lifestyle education, counseling to decrease risk of preventable diseases and screening for fall risk and other medical concerns.    This visit is provided free of charge (no copay) for all Medicare recipients. The clinical pharmacists at [facility] have begun to conduct these Wellness Visits which will also include a thorough review of all your medications.    As you primary medical provider recommend that you make an appointment for your Annual Wellness Visit if you have not done so already this year.  You may set up this appointment before you leave today or you may call back ([phone]) and schedule an appointment.  Please make sure when you call that you mention that you are scheduling your Annual Wellness Visit with the clinical pharmacist so that the appointment may be made for the proper length of time.     Continue current medications. Continue good therapeutic lifestyle changes which include good diet and exercise. Fall precautions discussed with patient. If an FOBT was given today- please return it to our front desk. If you are over 46 years old - you may need Prevnar 71 or the adult Pneumonia vaccine.  **Flu shots are available--- please call and schedule a FLU-CLINIC appointment**  After your visit with [place] today you will receive a survey in the mail or online from [employer] regarding your care with [place]. Please take a moment to fill this out. Your feedback is very important to [place] as you can help [place] better understand your patient needs as well as improve your experience and satisfaction. WE CARE ABOUT YOU!!!   Continue to drink plenty of fluids and stay well-hydrated Avoid all NSAIDs Prop up feet for 20 minutes a couple of times daily higher than the level of the heart We will call with results of B12 and thyroid as soon as those results become available along  with the urinalysis result. In the future you can use Debrox, and earwax softener by putting 3 or 4 drops in the affected ear nightly for 3 nights waiting a week and repeating the same regimen the next week and this will help soften the earwax.  Arrie Senate MD

## 2018-05-07 NOTE — Patient Instructions (Addendum)
Medicare Annual Wellness Visit  [facility] and the medical providers at [facility] strive to bring you the best medical care.  In doing so we not only want to address your current medical conditions and concerns but also to detect new conditions early and prevent illness, disease and health-related problems.    Medicare offers a yearly Wellness Visit which allows our clinical staff to assess your need for preventative services including immunizations, lifestyle education, counseling to decrease risk of preventable diseases and screening for fall risk and other medical concerns.    This visit is provided free of charge (no copay) for all Medicare recipients. The clinical pharmacists at [facility] have begun to conduct these Wellness Visits which will also include a thorough review of all your medications.    As you primary medical provider recommend that you make an appointment for your Annual Wellness Visit if you have not done so already this year.  You may set up this appointment before you leave today or you may call back ([phone]) and schedule an appointment.  Please make sure when you call that you mention that you are scheduling your Annual Wellness Visit with the clinical pharmacist so that the appointment may be made for the proper length of time.     Continue current medications. Continue good therapeutic lifestyle changes which include good diet and exercise. Fall precautions discussed with patient. If an FOBT was given today- please return it to our front desk. If you are over 37 years old - you may need Prevnar 86 or the adult Pneumonia vaccine.  **Flu shots are available--- please call and schedule a FLU-CLINIC appointment**  After your visit with [place] today you will receive a survey in the mail or online from [employer] regarding your care with [place]. Please take a moment to fill this out. Your feedback is very  important to [place] as you can help [place] better understand your patient needs as well as improve your experience and satisfaction. WE CARE ABOUT YOU!!!   Continue to drink plenty of fluids and stay well-hydrated Avoid all NSAIDs Prop up feet for 20 minutes a couple of times daily higher than the level of the heart We will call with results of B12 and thyroid as soon as those results become available along with the urinalysis result. In the future you can use Debrox, and earwax softener by putting 3 or 4 drops in the affected ear nightly for 3 nights waiting a week and repeating the same regimen the next week and this will help soften the earwax.

## 2018-05-09 LAB — THYROID PANEL WITH TSH
Free Thyroxine Index: 1.8 (ref 1.2–4.9)
T3 Uptake Ratio: 28 % (ref 24–39)
T4, Total: 6.6 ug/dL (ref 4.5–12.0)
TSH: 1.24 u[IU]/mL (ref 0.450–4.500)

## 2018-05-09 LAB — VITAMIN B12: Vitamin B-12: 1191 pg/mL (ref 232–1245)

## 2018-05-09 LAB — SPECIMEN STATUS REPORT

## 2018-05-10 LAB — URINE CULTURE

## 2018-05-22 DIAGNOSIS — D229 Melanocytic nevi, unspecified: Secondary | ICD-10-CM | POA: Diagnosis not present

## 2018-05-22 DIAGNOSIS — L570 Actinic keratosis: Secondary | ICD-10-CM | POA: Diagnosis not present

## 2018-05-22 DIAGNOSIS — L309 Dermatitis, unspecified: Secondary | ICD-10-CM | POA: Diagnosis not present

## 2018-06-04 ENCOUNTER — Ambulatory Visit (INDEPENDENT_AMBULATORY_CARE_PROVIDER_SITE_OTHER): Payer: Medicare HMO | Admitting: *Deleted

## 2018-06-04 DIAGNOSIS — Z23 Encounter for immunization: Secondary | ICD-10-CM | POA: Diagnosis not present

## 2018-06-04 NOTE — Progress Notes (Signed)
Pt given flu vaccine Tolerated well 

## 2018-08-05 ENCOUNTER — Other Ambulatory Visit: Payer: Medicare HMO

## 2018-08-05 DIAGNOSIS — Z1211 Encounter for screening for malignant neoplasm of colon: Secondary | ICD-10-CM

## 2018-08-07 LAB — FECAL OCCULT BLOOD, IMMUNOCHEMICAL: Fecal Occult Bld: NEGATIVE

## 2018-08-14 ENCOUNTER — Other Ambulatory Visit: Payer: Self-pay | Admitting: *Deleted

## 2018-08-14 MED ORDER — FAMOTIDINE 20 MG PO TABS: 20.0000 mg | ORAL_TABLET | Freq: Two times a day (BID) | ORAL | 3 refills | Status: DC

## 2018-09-02 ENCOUNTER — Encounter: Payer: Self-pay | Admitting: Family Medicine

## 2018-09-02 ENCOUNTER — Ambulatory Visit (INDEPENDENT_AMBULATORY_CARE_PROVIDER_SITE_OTHER): Payer: Medicare Other | Admitting: Family Medicine

## 2018-09-02 VITALS — BP 134/72 | HR 97 | Temp 97.3°F | Ht 63.0 in | Wt 150.2 lb

## 2018-09-02 DIAGNOSIS — J0100 Acute maxillary sinusitis, unspecified: Secondary | ICD-10-CM

## 2018-09-02 MED ORDER — FLUTICASONE PROPIONATE 50 MCG/ACT NA SUSP: 1.0000 | Freq: Two times a day (BID) | NASAL | 6 refills | Status: DC | PRN

## 2018-09-02 MED ORDER — HYDROCODONE-HOMATROPINE 5-1.5 MG/5ML PO SYRP: 5.0000 mL | ORAL_SOLUTION | Freq: Four times a day (QID) | ORAL | 0 refills | Status: DC | PRN

## 2018-09-02 NOTE — Progress Notes (Signed)
BP 134/72   Pulse 97   Temp (!) 97.3 F (36.3 C) (Oral)   Ht 5\' 3"  (1.6 m)   Wt 150 lb 3.2 oz (68.1 kg)   SpO2 95%   BMI 26.61 kg/m    Subjective:    Patient ID: Kimberly Suarez, female    DOB: [birth year], 78 y.o.   MRN: [id number]  HPI: Kimberly Suarez is a 78 y.o. female presenting on 09/02/2018 for Cough (x 2 days); Nasal Congestion; and Sore Throat   HPI Patient comes in complaining of cough and congestion and sore throat that is been going on over the past.  She says that her husband was just recently and treated for bronchitis because he is got down into her chest but she does not feel like she is there yet and she does not feel like she has any shortness of breath or wheezing.  She denies any fevers or chills.  She does have postnasal drainage and some sinus pressure and congestion.  She had taken the Mucinex but does not know if it is made a difference because she just started taking it.  She does not feel like anything is improving yet but it is only been 2 days so she does not know when it will improve but she wanted to get in before it got too bad.  Relevant past medical, surgical, family and social history reviewed and updated as indicated. Interim medical history since our last visit reviewed. Allergies and medications reviewed and updated.  Review of Systems  Constitutional: Negative for chills and fever.  HENT: Positive for congestion, postnasal drip, rhinorrhea, sinus pressure and sore throat. Negative for ear discharge, ear pain and sneezing.   Eyes: Negative for pain, redness and visual disturbance.  Respiratory: Positive for cough. Negative for chest tightness, shortness of breath and wheezing.   Cardiovascular: Negative for chest pain and leg swelling.  Musculoskeletal: Negative for back pain and gait problem.  Skin: Negative for rash.  Neurological: Negative for light-headedness and headaches.  Psychiatric/Behavioral: Negative for agitation and behavioral  problems.  All other systems reviewed and are negative.   Per HPI unless specifically indicated above       Objective:    BP 134/72   Pulse 97   Temp (!) 97.3 F (36.3 C) (Oral)   Ht 5\' 3"  (1.6 m)   Wt 150 lb 3.2 oz (68.1 kg)   SpO2 95%   BMI 26.61 kg/m   Wt Readings from Last 3 Encounters:  09/02/18 150 lb 3.2 oz (68.1 kg)  05/07/18 150 lb (68 kg)  03/04/18 146 lb (66.2 kg)    Physical Exam Vitals signs reviewed.  Constitutional:      General: She is not in acute distress.    Appearance: She is well-developed. She is not diaphoretic.  HENT:     Right Ear: Tympanic membrane, ear canal and external ear normal.     Left Ear: Tympanic membrane, ear canal and external ear normal.     Nose: Mucosal edema and rhinorrhea present.     Right Sinus: No maxillary sinus tenderness or frontal sinus tenderness.     Left Sinus: No maxillary sinus tenderness or frontal sinus tenderness.     Mouth/Throat:     Pharynx: Uvula midline. Posterior oropharyngeal erythema present. No oropharyngeal exudate.     Tonsils: No tonsillar abscesses.  Eyes:     Conjunctiva/sclera: Conjunctivae normal.  Cardiovascular:     Rate and Rhythm: Normal rate and  regular rhythm.     Heart sounds: Normal heart sounds. No murmur.  Pulmonary:     Effort: Pulmonary effort is normal. No respiratory distress.     Breath sounds: Normal breath sounds. No wheezing.  Musculoskeletal: Normal range of motion.        General: No tenderness.  Skin:    General: Skin is warm and dry.     Findings: No rash.  Neurological:     Mental Status: She is alert and oriented to person, place, and time.     Coordination: Coordination normal.  Psychiatric:        Behavior: Behavior normal.         Assessment & Plan:   Problem List Items Addressed This Visit    None    Visit Diagnoses    Acute non-recurrent maxillary sinusitis    -  Primary   Relevant Medications   fluticasone (FLONASE) 50 MCG/ACT nasal spray    HYDROcodone-homatropine (HYCODAN) 5-1.5 MG/5ML syrup      Recommended Flonase and continued Mucinex and sent in Hycodan for her. Follow up plan: Return if symptoms worsen or fail to improve.  Counseling provided for all of the vaccine components No orders of the defined types were placed in this encounter.   Caryl Pina, MD [facility] 09/02/2018, 4:33 PM

## 2018-09-06 ENCOUNTER — Telehealth: Payer: Self-pay | Admitting: Family Medicine

## 2018-09-06 MED ORDER — AMOXICILLIN 500 MG PO CAPS: 500.0000 mg | ORAL_CAPSULE | Freq: Three times a day (TID) | ORAL | 0 refills | Status: DC

## 2018-09-06 NOTE — Telephone Encounter (Signed)
Med sent in / aware  

## 2018-09-06 NOTE — Telephone Encounter (Signed)
Please go ahead and call in a prescription for amoxicillin 500 3 times a day for 10 days with food intake Mucinex maximum strength plain, 1 twice daily for cough and congestion and use nasal saline frequently in each nostril during the day.

## 2018-09-20 ENCOUNTER — Encounter: Payer: Medicare Other | Admitting: *Deleted

## 2018-10-09 ENCOUNTER — Ambulatory Visit (INDEPENDENT_AMBULATORY_CARE_PROVIDER_SITE_OTHER): Payer: Medicare Other

## 2018-10-09 VITALS — BP 108/66 | HR 94 | Temp 97.3°F | Ht 63.0 in | Wt 150.0 lb

## 2018-10-09 DIAGNOSIS — Z0000 Encounter for general adult medical examination without abnormal findings: Secondary | ICD-10-CM

## 2018-10-09 NOTE — Patient Instructions (Signed)
  Kimberly Suarez , Thank you for taking time to come for your Medicare Wellness Visit. I appreciate your ongoing commitment to your health goals. Please review the following plan we discussed and let me know if I can assist you in the future.   These are the goals we discussed: Goals    . DIET - EAT MORE FRUITS AND VEGETABLES    . Exercise 150 min/wk Moderate Activity       This is a list of the screening recommended for you and due dates:  Health Maintenance  Topic Date Due  . Pneumonia vaccines (2 of 2 - PPSV23) 11/08/2018*  . DEXA scan (bone density measurement)  11/08/2020  . Tetanus Vaccine  05/28/2021  . Flu Shot  Completed  *Topic was postponed. The date shown is not the original due date.

## 2018-10-09 NOTE — Progress Notes (Addendum)
Subjective:   Kimberly Suarez is a 78 y.o. female who presents for Medicare Annual (Subsequent) preventive examination. She lives in nearby [place] with her husband. 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 They have since passed the company on to their two sons. Both sons live close by and they also have a daughter. She is independent and her and her husband take care of themselves. She visits the [facility] twice weekly for exercise and enjoys watching TV. She states that 8th grade was the last grade that she completed and she dropped out of school to marry her husband. They do not have any pets at home. They have a basement but have had their washer and dryer moved upstairs to try and prevent falls. She occasionally has to go to the basement but its very rare. She states that her and her husband have a will but nothing is official yet and they have been putting it off.   Review of Systems:   Cardiac Risk Factors include: advanced age (>54men, >41 women);dyslipidemia;hypertension     Objective:     Vitals: BP 108/66   Pulse 94   Temp (!) 97.3 F (36.3 C) (Oral)   Ht 5\' 3"  (1.6 m)   Wt 150 lb (68 kg)   BMI 26.57 kg/m   Body mass index is 26.57 kg/m.  Advanced Directives 10/09/2018 06/01/2016 12/20/2015 06/09/2015 06/01/2014  Does Patient Have a Medical Advance Directive? No No Yes No No  Type of Advance Directive - - [place]  Would patient like information on creating a medical advance directive? No - Patient declined No - patient declined information - No - patient declined information Yes - Educational materials given   Her and husband have a will but they haven't made anything official yet. She declined information  Tobacco Social History   Tobacco Use  Smoking Status Never Smoker  Smokeless Tobacco Never Used     Patient has never been a smoker  Clinical  Intake:  Pre-visit preparation completed: No  Pain : 0-10 Pain Score: 3  Pain Type: Chronic pain Pain Location: Knee Pain Orientation: Right, Left Pain Descriptors / Indicators: Discomfort Pain Onset: More than a month ago Pain Frequency: Occasional Pain Relieving Factors: Once she gets up and moving she is better Effect of Pain on Daily Activities: Sometimes slows things down  Pain Relieving Factors: Once she gets up and moving she is better  BMI - recorded: 26.57 Nutritional Status: BMI 25 -29 Overweight Nutritional Risks: None Diabetes: No  How often do you need to have someone help you when you read instructions, pamphlets, or other written materials from your doctor or pharmacy?: 1 - Never What is the last grade level you completed in school?: 8th grade  Interpreter Needed?: No  Information entered by :: Theodoro Clock LPN  Past Medical History:  Diagnosis Date  . ASCUS (atypical squamous cells of undetermined significance) on Pap smear 2011x2   Negative high-risk HPV, normal Pap smears 2012/2013  . Cataract   . Diverticulosis   . GERD (gastroesophageal reflux disease)   . Hiatal hernia   . Hyperlipemia   . Hypertension   . IBS (irritable bowel syndrome)   . Lymphocytic colitis 2007  . Uterine prolapse    Pessary   Past Surgical History:  Procedure Laterality Date  . BREAST BIOPSY  unsure which breast  . CATARACT EXTRACTION W/ INTRAOCULAR LENS IMPLANT Bilateral    Family History  Problem Relation Age of Onset  . Heart disease Mother   . Diabetes Brother        BKA  . COPD Father   . Asthma Father   . Alzheimer's disease Sister   . Alzheimer's disease Brother   . Multiple sclerosis Sister   . Heart disease Sister   . Diabetes Brother   . Early death Brother        died in war  . Colon cancer Neg Hx   . Kidney disease Neg Hx   . Liver disease Neg Hx    Social History   Socioeconomic History  . Marital status: Married    Spouse name: Not on  file  . Number of children: 3   . Years of education: Not on file  . Highest education level: 8th grade  Occupational History  . Occupation: Retired   Scientific laboratory technician  . Financial resource strain: Not hard at all  . Food insecurity:    Worry: Never true    Inability: Never true  . Transportation needs:    Medical: No    Non-medical: No  Tobacco Use  . Smoking status: Never Smoker  . Smokeless tobacco: Never Used  Substance and Sexual Activity  . Alcohol use: Yes    Alcohol/week: 0.0 standard drinks    Comment: Rare  . Drug use: No  . Sexual activity: Yes    Birth control/protection: Post-menopausal    Comment: 1st intercourse 15 yo--1 partner  Lifestyle  . Physical activity:    Days per week: 2 days    Minutes per session: 30 min  . Stress: Not at all  Relationships  . Social connections:    Talks on phone: More than three times a week    Gets together: More than three times a week    Attends religious service: More than 4 times per year    Active member of club or organization: No    Attends meetings of clubs or organizations: Never    Relationship status: Married  Other Topics Concern  . Not on file  Social History Narrative   Occ caffeine drinks      Outpatient Encounter Medications as of 10/09/2018  Medication Sig  . AMBULATORY NON FORMULARY MEDICATION Medication Name: Omega XL, take 2 capsules daily for joints, etc  . aspirin 81 MG tablet Take 81 mg by mouth daily.    Marland Kitchen atorvastatin (LIPITOR) 40 MG tablet Take 1 tablet (40 mg total) by mouth daily.  Marland Kitchen b complex vitamins capsule Take 1 capsule by mouth daily.  . Calcium Carbonate-Simethicone (TUMS PLUS PO) Take 1 tablet by mouth as needed.  . enalapril (VASOTEC) 10 MG tablet TAKE 1 TABLET BY MOUTH TWO  TIMES DAILY  . famotidine (PEPCID) 20 MG tablet Take 1 tablet (20 mg total) by mouth 2 (two) times daily.  . fluticasone (FLONASE) 50 MCG/ACT nasal spray Place 1 spray into both nostrils 2 (two) times daily as  needed for allergies or rhinitis.  . Glucosamine-Chondroitin (GLUCOSAMINE CHONDR COMPLEX PO) Take 1 tablet by mouth 2 (two) times daily.   . hydrochlorothiazide (HYDRODIURIL) 25 MG tablet Take 1 tablet (25 mg total) by mouth daily.  . Multiple Vitamins-Minerals (MULTIVITAMIN ADULT PO) Take 1 tablet by mouth daily.  . Omega-3 Fatty Acids (FISH OIL) 1000 MG CAPS Take 1 capsule by mouth daily.   . [DISCONTINUED] amoxicillin (AMOXIL)  500 MG capsule Take 1 capsule (500 mg total) by mouth 3 (three) times daily.  . [DISCONTINUED] HYDROcodone-homatropine (HYCODAN) 5-1.5 MG/5ML syrup Take 5 mLs by mouth every 6 (six) hours as needed for cough.   No facility-administered encounter medications on file as of 10/09/2018.     Activities of Daily Living In your present state of health, do you have any difficulty performing the following activities: 10/09/2018  Hearing? N  Vision? Y  Comment Wears reading glasses  Difficulty concentrating or making decisions? N  Walking or climbing stairs? N  Dressing or bathing? N  Doing errands, shopping? N  Preparing Food and eating ? N  Using the Toilet? N  In the past six months, have you accidently leaked urine? N  Do you have problems with loss of bowel control? N  Managing your Medications? N  Managing your Finances? N  Housekeeping or managing your Housekeeping? N  Some recent data might be hidden    Patient Care Team: Chipper Herb, MD as PCP - General (Family Medicine) Pyrtle, Lajuan Lines, MD as Consulting Physician (Gastroenterology) Phineas Real, Belinda Block, MD as Consulting Physician (Gynecology)    Assessment:   This is a routine wellness examination for Ilisha.  Exercise Activities and Dietary recommendations Current Exercise Habits: Home exercise routine, Type of exercise: treadmill, Time (Minutes): 30, Frequency (Times/Week): 2, Weekly Exercise (Minutes/Week): 60, Intensity: Moderate  Goals    . DIET - EAT MORE FRUITS AND VEGETABLES    . Exercise 150  min/wk Moderate Activity       Fall Risk Fall Risk  10/09/2018 09/02/2018 05/07/2018 11/07/2017 05/09/2017  Falls in the past year? 0 0 No No No   Is the patient's home free of loose throw rugs in walkways, pet beds, electrical cords, etc?   yes      Grab bars in the bathroom? no      Handrails on the stairs?   yes      Adequate lighting?   yes    Depression Screen PHQ 2/9 Scores 10/09/2018 09/02/2018 05/07/2018 03/04/2018  PHQ - 2 Score 0 0 0 1     Cognitive Function MMSE - Mini Mental State Exam 10/09/2018 06/01/2016 06/09/2015  Orientation to time 5 5 5   Orientation to Place 5 5 5   Registration 3 3 3   Attention/ Calculation 3 3 5   Recall 3 2 2   Language- name 2 objects 2 2 2   Language- repeat 1 1 1   Language- follow 3 step command 3 3 3   Language- read & follow direction 1 1 1   Write a sentence 1 1 1   Copy design 1 1 1   Total score 28 27 29       Patient had no problem completing the MMSE  Immunization History  Administered Date(s) Administered  . Influenza, High Dose Seasonal PF 06/28/2015, 06/01/2016, 06/06/2017, 06/04/2018  . Influenza,inj,Quad PF,6+ Mos 07/02/2013, 05/26/2014  . Pneumococcal Conjugate-13 07/02/2013    Qualifies for Shingles Vaccine? Patient is unsure if she wants to get the shingrix vaccine.. She has an upcoming appt in March and will think about it and discuss with PCP at that time.  Screening Tests Health Maintenance  Topic Date Due  . PNA vac Low Risk Adult (2 of 2 - PPSV23) 11/08/2018 (Originally 07/02/2014)  . DEXA SCAN  11/08/2020  . TETANUS/TDAP  05/28/2021  . INFLUENZA VACCINE  Completed   Patient will have her pneumovax 23 at her follow up appt in March with PCP  Cancer Screenings: Lung:  Low Dose CT Chest recommended if Age 33-80 years, 30 pack-year currently smoking OR have quit w/in 15years. Patient does not qualify. Breast:  Up to date on Mammogram? Yes   Up to date of Bone Density/Dexa? Yes Colorectal: N/A  Additional Screenings:    Hepatitis C Screening:  Patient does not fall within the guidelines for th is test     Plan:     I have personally reviewed and noted the following in the patient's chart:   . Medical and social history . Use of alcohol, tobacco or illicit drugs  . Current medications and supplements . Functional ability and status . Nutritional status . Physical activity . Advanced directives . List of other physicians . Hospitalizations, surgeries, and ER visits in previous 12 months . Vitals . Screenings to include cognitive, depression, and falls . Referrals and appointments  In addition, I have reviewed and discussed with patient certain preventive protocols, quality metrics, and best practice recommendations. A written personalized care plan for preventive services as well as general preventive health recommendations were provided to patient.     Rolena Infante, LPN  [id number]  I have reviewed and agree with the above AWV documentation.  Arrie Senate MD

## 2018-11-04 ENCOUNTER — Ambulatory Visit (INDEPENDENT_AMBULATORY_CARE_PROVIDER_SITE_OTHER): Payer: Medicare Other | Admitting: Family Medicine

## 2018-11-04 ENCOUNTER — Encounter: Payer: Self-pay | Admitting: Family Medicine

## 2018-11-04 VITALS — BP 101/63 | HR 71 | Temp 97.7°F | Ht 63.0 in | Wt 150.0 lb

## 2018-11-04 DIAGNOSIS — I10 Essential (primary) hypertension: Secondary | ICD-10-CM | POA: Diagnosis not present

## 2018-11-04 DIAGNOSIS — K2100 Gastro-esophageal reflux disease with esophagitis, without bleeding: Secondary | ICD-10-CM

## 2018-11-04 DIAGNOSIS — N183 Chronic kidney disease, stage 3 unspecified: Secondary | ICD-10-CM

## 2018-11-04 DIAGNOSIS — R5383 Other fatigue: Secondary | ICD-10-CM | POA: Diagnosis not present

## 2018-11-04 DIAGNOSIS — F5101 Primary insomnia: Secondary | ICD-10-CM | POA: Diagnosis not present

## 2018-11-04 DIAGNOSIS — E7800 Pure hypercholesterolemia, unspecified: Secondary | ICD-10-CM | POA: Diagnosis not present

## 2018-11-04 DIAGNOSIS — E559 Vitamin D deficiency, unspecified: Secondary | ICD-10-CM | POA: Diagnosis not present

## 2018-11-04 MED ORDER — ENALAPRIL MALEATE 10 MG PO TABS: 10.0000 mg | ORAL_TABLET | Freq: Two times a day (BID) | ORAL | 3 refills | Status: DC

## 2018-11-04 MED ORDER — HYDROCHLOROTHIAZIDE 25 MG PO TABS: 25.0000 mg | ORAL_TABLET | Freq: Every day | ORAL | 3 refills | Status: DC

## 2018-11-04 MED ORDER — FAMOTIDINE 20 MG PO TABS: 20.0000 mg | ORAL_TABLET | Freq: Two times a day (BID) | ORAL | 3 refills | Status: DC

## 2018-11-04 MED ORDER — PANTOPRAZOLE SODIUM 40 MG PO TBEC: 40.0000 mg | DELAYED_RELEASE_TABLET | Freq: Every day | ORAL | 1 refills | Status: DC

## 2018-11-04 MED ORDER — ATORVASTATIN CALCIUM 40 MG PO TABS: 40.0000 mg | ORAL_TABLET | Freq: Every day | ORAL | 3 refills | Status: DC

## 2018-11-04 NOTE — Patient Instructions (Addendum)
Medicare Annual Wellness Visit  [facility] and the medical providers at [facility] strive to bring you the best medical care.  In doing so we not only want to address your current medical conditions and concerns but also to detect new conditions early and prevent illness, disease and health-related problems.    Medicare offers a yearly Wellness Visit which allows our clinical staff to assess your need for preventative services including immunizations, lifestyle education, counseling to decrease risk of preventable diseases and screening for fall risk and other medical concerns.    This visit is provided free of charge (no copay) for all Medicare recipients. The clinical pharmacists at [facility] have begun to conduct these Wellness Visits which will also include a thorough review of all your medications.    As you primary medical provider recommend that you make an appointment for your Annual Wellness Visit if you have not done so already this year.  You may set up this appointment before you leave today or you may call back ([phone]) and schedule an appointment.  Please make sure when you call that you mention that you are scheduling your Annual Wellness Visit with the clinical pharmacist so that the appointment may be made for the proper length of time.    Continue current medications. Continue good therapeutic lifestyle changes which include good diet and exercise. Fall precautions discussed with patient. If an FOBT was given today- please return it to our front desk. If you are over 68 years old - you may need Prevnar 73 or the adult Pneumonia vaccine.  **Flu shots are available--- please call and schedule a FLU-CLINIC appointment**  After your visit with [place] today you will receive a survey in the mail or online from [employer] regarding your care with [place]. Please take a moment to fill this out. Your feedback is very  important to [place] as you can help [place] better understand your patient needs as well as improve your experience and satisfaction. WE CARE ABOUT YOU!!!   Hold the Pepcid AC before breakfast and supper and try Protonix or pantoprazole 40 mg once daily before breakfast and take this for 1 month and then go back on the Pepcid AC.  See if this helps the indigestion. Continue to avoid caffeine and fried foods as much as possible Drink more water Take Tylenol for aches pains fever and arthralgias. Continue to follow-up with ophthalmologist due to problems with the left. Do not forget to schedule your mammogram. Take 3 mg of melatonin over-the-counter and try this for sleep at bedtime for 1 month, if this does not work call back and we will try some trazodone 50 mg and have you take that 1/2-1 at bedtime. Take Tylenol as mentioned above for aches pains and fever.

## 2018-11-04 NOTE — Progress Notes (Signed)
Subjective:    Patient ID: Kimberly Suarez, female    DOB: [birth year], 78 y.o.   MRN: [id number]  HPI Pt here for follow up and management of chronic medical problems which includes hypertension and hyperlipidemia. She is taking medication regularly.  The patient is doing well overall other than complaining with arthralgias and trouble falling asleep.  She is due to get a mammogram and plans to schedule this.  She is also due to get lab work today.  She is requesting refills on several of her medicines.  The patient denies any chest pain pressure tightness or shortness of breath.  She does have some indigestion at times and the Pepcid [facility] does not seem to be working on this.  She denies the intake of any significant amounts of caffeine.  She does not have any caffeine after lunch every day.  She denies any blood in the stool or black tarry bowel movements and so she is passing her water well.    Patient Active Problem List   Diagnosis Date Noted  . Stage III chronic kidney disease ([place]) 10/31/2017  . Diarrhea 06/19/2016  . Pre-diabetes 06/01/2014  . Renal insufficiency 11/18/2013  . Osteoarthritis of left knee 11/18/2013  . HTN (hypertension) 06/26/2013  . Microscopic colitis 10/30/2012  . Hyperlipidemia 10/30/2012  . Unspecified constipation 07/10/2011  . Dysphagia, unspecified(787.20) 07/10/2011  . Diverticulosis of colon (without mention of hemorrhage) 07/10/2011  . Special screening for malignant neoplasms, colon 07/10/2011  . GERD with stricture 07/10/2011   Outpatient Encounter Medications as of 11/04/2018  Medication Sig  . AMBULATORY NON FORMULARY MEDICATION Medication Name: Omega XL, take 2 capsules daily for joints, etc  . aspirin 81 MG tablet Take 81 mg by mouth daily.    Marland Kitchen atorvastatin (LIPITOR) 40 MG tablet Take 1 tablet (40 mg total) by mouth daily.  Marland Kitchen b complex vitamins capsule Take 1 capsule by mouth daily.  . Calcium Carbonate-Simethicone (TUMS PLUS PO) Take 1 tablet by  mouth as needed.  . enalapril (VASOTEC) 10 MG tablet TAKE 1 TABLET BY MOUTH TWO  TIMES DAILY  . famotidine (PEPCID) 20 MG tablet Take 1 tablet (20 mg total) by mouth 2 (two) times daily.  . fluticasone (FLONASE) 50 MCG/ACT nasal spray Place 1 spray into both nostrils 2 (two) times daily as needed for allergies or rhinitis.  . Glucosamine-Chondroitin (GLUCOSAMINE CHONDR COMPLEX PO) Take 1 tablet by mouth 2 (two) times daily.   . hydrochlorothiazide (HYDRODIURIL) 25 MG tablet Take 1 tablet (25 mg total) by mouth daily.  . Multiple Vitamins-Minerals (MULTIVITAMIN ADULT PO) Take 1 tablet by mouth daily.  . Omega-3 Fatty Acids (FISH OIL) 1000 MG CAPS Take 1 capsule by mouth daily.    No facility-administered encounter medications on file as of 11/04/2018.      Review of Systems  Constitutional: Negative.        Sleep issues   HENT: Negative.   Eyes: Negative.   Respiratory: Negative.   Cardiovascular: Negative.   Gastrointestinal: Negative.   Endocrine: Negative.   Genitourinary: Negative.   Musculoskeletal: Positive for arthralgias.  Skin: Negative.   Allergic/Immunologic: Negative.   Neurological: Negative.   Hematological: Negative.   Psychiatric/Behavioral: Negative.        Objective:   Physical Exam Vitals signs and nursing note reviewed.  Constitutional:      Appearance: Normal appearance. She is well-developed.     Comments: Patient is pleasant and alert and in no acute distress.  HENT:  Head: Normocephalic and atraumatic.     Right Ear: Tympanic membrane, ear canal and external ear normal. There is no impacted cerumen.     Left Ear: Tympanic membrane, ear canal and external ear normal. There is no impacted cerumen.     Nose: Nose normal. No congestion.     Mouth/Throat:     Mouth: Mucous membranes are moist.     Pharynx: Oropharynx is clear.  Eyes:     General: No scleral icterus.       Right eye: No discharge.        Left eye: No discharge.     Extraocular  Movements: Extraocular movements intact.     Conjunctiva/sclera: Conjunctivae normal.     Pupils: Pupils are equal, round, and reactive to light.     Comments: Patient is having ongoing problems with the left.  She will continue to follow-up with her ophthalmologist.  Neck:     Musculoskeletal: Normal range of motion and neck supple.     Thyroid: No thyromegaly.     Vascular: No carotid bruit or JVD.     Comments: No bruits thyromegaly or anterior cervical adenopathy Cardiovascular:     Rate and Rhythm: Normal rate and regular rhythm.     Pulses: Normal pulses.     Heart sounds: Normal heart sounds. No murmur.     Comments: Heart is regular at 72/min with good pedal pulses and no edema Pulmonary:     Effort: Pulmonary effort is normal. No respiratory distress.     Breath sounds: Normal breath sounds. No wheezing or rales.     Comments: Clear anteriorly and posteriorly Abdominal:     General: Abdomen is flat. Bowel sounds are normal.     Palpations: Abdomen is soft. There is no mass.     Tenderness: There is abdominal tenderness. There is no guarding.     Comments: Generalized upper abdominal tenderness without liver or spleen enlargement, no masses, no bruits and no inguinal adenopathy.  Musculoskeletal: Normal range of motion.        General: No tenderness.     Right lower leg: No edema.     Left lower leg: No edema.  Lymphadenopathy:     Cervical: No cervical adenopathy.  Skin:    General: Skin is warm and dry.     Findings: No rash.  Neurological:     General: No focal deficit present.     Mental Status: She is alert and oriented to person, place, and time. Mental status is at baseline.     Cranial Nerves: No cranial nerve deficit.     Deep Tendon Reflexes: Reflexes are normal and symmetric. Reflexes normal.  Psychiatric:        Mood and Affect: Mood normal.        Behavior: Behavior normal.        Thought Content: Thought content normal.        Judgment: Judgment normal.       Comments: Normal mood affect and behavior.    BP 101/63 (BP Location: Left Arm)   Pulse 71   Temp 97.7 F (36.5 C) (Oral)   Ht 5' 3"  (1.6 m)   Wt 150 lb (68 kg)   BMI 26.57 kg/m         Assessment & Plan:  1. Essential hypertension -Blood pressure is good today and she will continue with current treatment - BMP8+EGFR - CBC with Differential/Platelet - Hepatic function panel  2. Pure hypercholesterolemia -Continue  with as aggressive therapeutic lifestyle changes as possible including diet and exercise - CBC with Differential/Platelet - Lipid panel  3. Vitamin D deficiency -MND replacement - CBC with Differential/Platelet - VITAMIN D 25 Hydroxy (Vit-D Deficiency, Fractures)  4. Gastroesophageal reflux disease with esophagitis -Hold the Pepcid AC and try pantoprazole at least for 1 month 1 before breakfast daily. - CBC with Differential/Platelet - Hepatic function panel  5. Stage III chronic kidney disease ([place]) -Continue to avoid all NSAIDs and only take Tylenol if needed for aches pains and fever - BMP8+EGFR - CBC with Differential/Platelet  6. Other fatigue - Vitamin B12  7. Primary insomnia -Try melatonin 3 mg at bedtime and if no help after 1 month, call back and we will call in some trazodone 50 mg to take 1/2-1 nightly as needed.  Meds ordered this encounter  Medications  . hydrochlorothiazide (HYDRODIURIL) 25 MG tablet    Sig: Take 1 tablet (25 mg total) by mouth daily.    Dispense:  90 tablet    Refill:  3  . famotidine (PEPCID) 20 MG tablet    Sig: Take 1 tablet (20 mg total) by mouth 2 (two) times daily.    Dispense:  180 tablet    Refill:  3  . enalapril (VASOTEC) 10 MG tablet    Sig: Take 1 tablet (10 mg total) by mouth 2 (two) times daily.    Dispense:  180 tablet    Refill:  3  . atorvastatin (LIPITOR) 40 MG tablet    Sig: Take 1 tablet (40 mg total) by mouth daily.    Dispense:  90 tablet    Refill:  3  . pantoprazole (PROTONIX) 40  MG tablet    Sig: Take 1 tablet (40 mg total) by mouth daily.    Dispense:  90 tablet    Refill:  1    Please cancel famotidine RX ( this is replacement )   Patient Instructions                       Medicare Annual Wellness Visit  [facility] and the medical providers at [facility] strive to bring you the best medical care.  In doing so we not only want to address your current medical conditions and concerns but also to detect new conditions early and prevent illness, disease and health-related problems.    Medicare offers a yearly Wellness Visit which allows our clinical staff to assess your need for preventative services including immunizations, lifestyle education, counseling to decrease risk of preventable diseases and screening for fall risk and other medical concerns.    This visit is provided free of charge (no copay) for all Medicare recipients. The clinical pharmacists at [facility] have begun to conduct these Wellness Visits which will also include a thorough review of all your medications.    As you primary medical provider recommend that you make an appointment for your Annual Wellness Visit if you have not done so already this year.  You may set up this appointment before you leave today or you may call back ([phone]) and schedule an appointment.  Please make sure when you call that you mention that you are scheduling your Annual Wellness Visit with the clinical pharmacist so that the appointment may be made for the proper length of time.    Continue current medications. Continue good therapeutic lifestyle changes which include good diet and exercise. Fall precautions discussed with patient.  If an FOBT was given today- please return it to our front desk. If you are over 50 years old - you may need Prevnar 31 or the adult Pneumonia vaccine.  **Flu shots are available--- please call and schedule a FLU-CLINIC appointment**  After  your visit with [place] today you will receive a survey in the mail or online from [employer] regarding your care with [place]. Please take a moment to fill this out. Your feedback is very important to [place] as you can help [place] better understand your patient needs as well as improve your experience and satisfaction. WE CARE ABOUT YOU!!!   Hold the Pepcid AC before breakfast and supper and try Protonix or pantoprazole 40 mg once daily before breakfast and take this for 1 month and then go back on the Pepcid AC.  See if this helps the indigestion. Continue to avoid caffeine and fried foods as much as possible Drink more water Take Tylenol for aches pains fever and arthralgias. Continue to follow-up with ophthalmologist due to problems with the left. Do not forget to schedule your mammogram. Take 3 mg of melatonin over-the-counter and try this for sleep at bedtime for 1 month, if this does not work call back and we will try some trazodone 50 mg and have you take that 1/2-1 at bedtime. Take Tylenol as mentioned above for aches pains and fever.  Arrie Senate MD

## 2018-11-05 ENCOUNTER — Ambulatory Visit: Payer: Medicare HMO | Admitting: Family Medicine

## 2018-11-05 LAB — CBC WITH DIFFERENTIAL/PLATELET
Basophils Absolute: 0.1 10*3/uL (ref 0.0–0.2)
Basos: 1 %
EOS (ABSOLUTE): 0.3 10*3/uL (ref 0.0–0.4)
Eos: 6 %
Hematocrit: 36.8 % (ref 34.0–46.6)
Hemoglobin: 12.5 g/dL (ref 11.1–15.9)
Immature Grans (Abs): 0 10*3/uL (ref 0.0–0.1)
Immature Granulocytes: 0 %
Lymphocytes Absolute: 2 10*3/uL (ref 0.7–3.1)
Lymphs: 37 %
MCH: 30.6 pg (ref 26.6–33.0)
MCHC: 34 g/dL (ref 31.5–35.7)
MCV: 90 fL (ref 79–97)
Monocytes Absolute: 0.6 10*3/uL (ref 0.1–0.9)
Monocytes: 10 %
Neutrophils Absolute: 2.5 10*3/uL (ref 1.4–7.0)
Neutrophils: 46 %
Platelets: 211 10*3/uL (ref 150–450)
RBC: 4.09 x10E6/uL (ref 3.77–5.28)
RDW: 12.7 % (ref 11.7–15.4)
WBC: 5.4 10*3/uL (ref 3.4–10.8)

## 2018-11-05 LAB — LIPID PANEL
Chol/HDL Ratio: 3.7 ratio (ref 0.0–4.4)
Cholesterol, Total: 152 mg/dL (ref 100–199)
HDL: 41 mg/dL
LDL Calculated: 82 mg/dL (ref 0–99)
Triglycerides: 146 mg/dL (ref 0–149)
VLDL Cholesterol Cal: 29 mg/dL (ref 5–40)

## 2018-11-05 LAB — BMP8+EGFR
BUN/Creatinine Ratio: 16 (ref 12–28)
BUN: 25 mg/dL (ref 8–27)
CO2: 26 mmol/L (ref 20–29)
Calcium: 10 mg/dL (ref 8.7–10.3)
Chloride: 98 mmol/L (ref 96–106)
Creatinine, Ser: 1.56 mg/dL — ABNORMAL HIGH (ref 0.57–1.00)
GFR calc Af Amer: 37 mL/min/{1.73_m2} — ABNORMAL LOW
GFR calc non Af Amer: 32 mL/min/{1.73_m2} — ABNORMAL LOW
Glucose: 103 mg/dL — ABNORMAL HIGH (ref 65–99)
Potassium: 4.1 mmol/L (ref 3.5–5.2)
Sodium: 138 mmol/L (ref 134–144)

## 2018-11-05 LAB — HEPATIC FUNCTION PANEL
ALT: 22 [IU]/L (ref 0–32)
AST: 24 [IU]/L (ref 0–40)
Albumin: 4.1 g/dL (ref 3.7–4.7)
Alkaline Phosphatase: 45 [IU]/L (ref 39–117)
Bilirubin Total: 0.4 mg/dL (ref 0.0–1.2)
Bilirubin, Direct: 0.09 mg/dL (ref 0.00–0.40)
Total Protein: 6.9 g/dL (ref 6.0–8.5)

## 2018-11-05 LAB — VITAMIN D 25 HYDROXY (VIT D DEFICIENCY, FRACTURES): Vit D, 25-Hydroxy: 48.5 ng/mL (ref 30.0–100.0)

## 2018-11-05 LAB — VITAMIN B12: Vitamin B-12: >2000 pg/mL — ABNORMAL HIGH (ref 232–1245)

## 2019-02-27 DIAGNOSIS — M79672 Pain in left foot: Secondary | ICD-10-CM | POA: Diagnosis not present

## 2019-02-27 DIAGNOSIS — M79671 Pain in right foot: Secondary | ICD-10-CM | POA: Diagnosis not present

## 2019-03-07 ENCOUNTER — Other Ambulatory Visit: Payer: Self-pay

## 2019-03-10 ENCOUNTER — Ambulatory Visit (INDEPENDENT_AMBULATORY_CARE_PROVIDER_SITE_OTHER): Payer: Medicare Other | Admitting: Gynecology

## 2019-03-10 ENCOUNTER — Encounter: Payer: Self-pay | Admitting: Gynecology

## 2019-03-10 ENCOUNTER — Other Ambulatory Visit: Payer: Self-pay

## 2019-03-10 VITALS — BP 124/84 | Ht 63.0 in | Wt 155.0 lb

## 2019-03-10 DIAGNOSIS — N952 Postmenopausal atrophic vaginitis: Secondary | ICD-10-CM | POA: Diagnosis not present

## 2019-03-10 DIAGNOSIS — Z01419 Encounter for gynecological examination (general) (routine) without abnormal findings: Secondary | ICD-10-CM

## 2019-03-10 DIAGNOSIS — N8189 Other female genital prolapse: Secondary | ICD-10-CM

## 2019-03-10 NOTE — Progress Notes (Signed)
    Kimberly Suarez 09/21/1940 [id number]        78 y.o.  G3P3003 for annual gynecologic exam.  Without gynecologic complaints  Past medical history,surgical history, problem list, medications, allergies, family history and social history were all reviewed and documented as reviewed in the EPIC chart.  ROS:  Performed with pertinent positives and negatives included in the history, assessment and plan.   Additional significant findings : None   Exam: [name] assistant Vitals:   03/10/19 1359  BP: 124/84  Weight: 155 lb (70.3 kg)  Height: 5\' 3"  (1.6 m)   Body mass index is 27.46 kg/m.  General appearance:  Normal affect, orientation and appearance. Skin: Grossly normal HEENT: Without gross lesions.  No cervical or supraclavicular adenopathy. Thyroid normal.  Lungs:  Clear without wheezing, rales or rhonchi Cardiac: RR, without RMG Abdominal:  Soft, nontender, without masses, guarding, rebound, organomegaly or hernia Breasts:  Examined lying and sitting without masses, retractions, discharge or axillary adenopathy. Pelvic:  Ext, BUS, Vagina: With atrophic changes.  First to second-degree uterine prolapse.  First to second-degree cystocele.  First-degree rectocele  Cervix: With atrophic changes.  Pap smear done  Uterus: Axial, normal size, shape and contour, midline and mobile nontender   Adnexa: Without masses or tenderness    Anus and perineum: Normal   Rectovaginal: Normal sphincter tone without palpated masses or tenderness.    Assessment/Plan:  78 y.o. G22P3003 female for annual gynecologic exam.   1. Postmenopausal.  No significant menopausal symptoms or any vaginal bleeding.  Had sparingly used Estrace vaginal cream in the past but has stopped doing this.  Not having issues with vaginitis symptoms. 2. Pelvic relaxation.  Cystocele/rectocele/uterine prolapse.  Stable on serial exams.  Uses ring pessary that she places herself.  Notes that it does seem to be coming out on  its own more frequently.  Recommended if continues to be an issue to schedule an appointment and bring her pessary with her and that maybe we need to fit her with a larger size.  She will follow-up as she feels appropriate. 3. Mammography 2018.  Reminded patient she is overdue and she agrees to arrange.  Breast exam normal today. 4. Pap smear 2018.  Patient is uncomfortable with stop screening recommendations and Pap smear was done today.  History of ASCUS x2 with negative high risk HPV in 2011 with otherwise normal Pap smears. 5. Colonoscopy 2012.  Repeat at their recommended interval. 6. DEXA 2017 normal through her primary physician's office.  Follow-up with them in reference to bone health and repeat recommendations. 7. Health maintenance.  No routine lab work done as patient does this elsewhere.  Follow-up 1 year, sooner as needed.   [name], 2:16 PM 03/10/2019

## 2019-03-10 NOTE — Addendum Note (Signed)
Addended by: Nelva Nay on: 03/10/2019 03:30 PM   Modules accepted: Orders

## 2019-03-10 NOTE — Patient Instructions (Signed)
Schedule your mammogram.  Follow-up for pessary fitting if your current pessary continues to be an issue.  Follow-up in 1 year for annual exam

## 2019-03-11 LAB — PAP IG W/ RFLX HPV ASCU

## 2019-03-12 ENCOUNTER — Encounter: Payer: Self-pay | Admitting: *Deleted

## 2019-03-14 ENCOUNTER — Encounter: Payer: Medicare Other | Admitting: Gynecology

## 2019-03-19 DIAGNOSIS — M25561 Pain in right knee: Secondary | ICD-10-CM | POA: Diagnosis not present

## 2019-03-19 DIAGNOSIS — M25562 Pain in left knee: Secondary | ICD-10-CM | POA: Diagnosis not present

## 2019-03-19 DIAGNOSIS — M1711 Unilateral primary osteoarthritis, right knee: Secondary | ICD-10-CM | POA: Diagnosis not present

## 2019-03-19 DIAGNOSIS — Z9889 Other specified postprocedural states: Secondary | ICD-10-CM | POA: Diagnosis not present

## 2019-03-29 ENCOUNTER — Other Ambulatory Visit: Payer: Self-pay | Admitting: Family Medicine

## 2019-04-28 ENCOUNTER — Other Ambulatory Visit: Payer: Self-pay | Admitting: Family Medicine

## 2019-04-28 DIAGNOSIS — Z1231 Encounter for screening mammogram for malignant neoplasm of breast: Secondary | ICD-10-CM

## 2019-04-29 ENCOUNTER — Telehealth: Payer: Self-pay | Admitting: General Practice

## 2019-05-06 ENCOUNTER — Ambulatory Visit: Payer: Medicare Other | Admitting: Family Medicine

## 2019-05-07 ENCOUNTER — Ambulatory Visit: Payer: Medicare Other | Admitting: Family Medicine

## 2019-05-13 ENCOUNTER — Other Ambulatory Visit: Payer: Self-pay

## 2019-05-14 ENCOUNTER — Ambulatory Visit (INDEPENDENT_AMBULATORY_CARE_PROVIDER_SITE_OTHER): Payer: Medicare Other | Admitting: Family Medicine

## 2019-05-14 ENCOUNTER — Encounter: Payer: Self-pay | Admitting: Family Medicine

## 2019-05-14 VITALS — BP 110/68 | HR 76 | Temp 96.8°F | Resp 16 | Ht 63.0 in | Wt 153.4 lb

## 2019-05-14 DIAGNOSIS — R7303 Prediabetes: Secondary | ICD-10-CM | POA: Diagnosis not present

## 2019-05-14 DIAGNOSIS — N183 Chronic kidney disease, stage 3 unspecified: Secondary | ICD-10-CM

## 2019-05-14 DIAGNOSIS — E7800 Pure hypercholesterolemia, unspecified: Secondary | ICD-10-CM | POA: Diagnosis not present

## 2019-05-14 DIAGNOSIS — I10 Essential (primary) hypertension: Secondary | ICD-10-CM

## 2019-05-14 DIAGNOSIS — M1711 Unilateral primary osteoarthritis, right knee: Secondary | ICD-10-CM

## 2019-05-14 LAB — BAYER DCA HB A1C WAIVED: HB A1C (BAYER DCA - WAIVED): 6.3 %

## 2019-05-14 MED ORDER — PANTOPRAZOLE SODIUM 40 MG PO TBEC: 40.0000 mg | DELAYED_RELEASE_TABLET | Freq: Every day | ORAL | 3 refills | Status: DC

## 2019-05-14 NOTE — Progress Notes (Signed)
Subjective: CC: Hypertension with hyperlipidemia, GERD PCP: [name], DO YDX:AJOIN Kimberly Suarez is a 78 y.o. female presenting to clinic today for:  1.  Hypertension with hyperlipidemia; CKD3 Patient reports compliance with her enalapril 10 mg daily, hydrochlorothiazide 25 mg daily and Lipitor 40 mg daily.  She has history of CKD 3.  Denies any oral NSAID use.  She does use Voltaren gel topically for chronic knee pain.  2.  GERD Patient reports compliance with Protonix.  Does not really use Pepcid much.  She denies any nausea, vomiting, abdominal pain, hematochezia or melena.  3.  Degenerative joint disease Patient reports degenerative joint in right knee.  She has history of arthroscopy.  She recently had a corticosteroid injection but does not feel that it is helped especially this time.  Again has Voltaren gel which she uses intermittently.  She has not been as physically active since COVID-19 but identifies that she needs to get back out into the gym or walking around as she does feel a bit more fatigued and has had some weight gain over the last several months.  ROS: Per HPI  No Known Allergies Past Medical History:  Diagnosis Date  . ASCUS (atypical squamous cells of undetermined significance) on Pap smear 2011x2   Negative high-risk HPV, normal Pap smears 2012/2013  . Atypical nevus 05/02/2007   right back-moderate   . Cancer ([place])   . Cataract   . Diverticulosis   . GERD (gastroesophageal reflux disease)   . Hiatal hernia   . Hyperlipemia   . Hypertension   . IBS (irritable bowel syndrome)   . Lymphocytic colitis 2007  . SCC (squamous cell carcinoma) 01/19/2006   bridge of nose (CX35FU)  . SCC (squamous cell carcinoma) 03/08/2017   right inner lower shin (CX35FU)  . SCC (squamous cell carcinoma) x 2 02/24/2002   right bridge of nose (Cx35FU),right bridge of nose  . Uterine prolapse    Pessary    Current Outpatient Medications:  .  AMBULATORY NON  FORMULARY MEDICATION, Medication Name: Omega XL, take 2 capsules daily for joints, etc, Disp: , Rfl:  .  aspirin 81 MG tablet, Take 81 mg by mouth daily.  , Disp: , Rfl:  .  atorvastatin (LIPITOR) 40 MG tablet, Take 1 tablet (40 mg total) by mouth daily., Disp: 90 tablet, Rfl: 3 .  b complex vitamins capsule, Take 1 capsule by mouth daily., Disp: , Rfl:  .  Calcium Carbonate-Simethicone (TUMS PLUS PO), Take 1 tablet by mouth as needed., Disp: , Rfl:  .  enalapril (VASOTEC) 10 MG tablet, Take 1 tablet (10 mg total) by mouth 2 (two) times daily., Disp: 180 tablet, Rfl: 3 .  famotidine (PEPCID) 20 MG tablet, Take 1 tablet (20 mg total) by mouth 2 (two) times daily., Disp: 180 tablet, Rfl: 3 .  fluticasone (FLONASE) 50 MCG/ACT nasal spray, Place 1 spray into both nostrils 2 (two) times daily as needed for allergies or rhinitis., Disp: 16 g, Rfl: 6 .  Glucosamine-Chondroitin (GLUCOSAMINE CHONDR COMPLEX PO), Take 1 tablet by mouth 2 (two) times daily. , Disp: , Rfl:  .  hydrochlorothiazide (HYDRODIURIL) 25 MG tablet, Take 1 tablet (25 mg total) by mouth daily., Disp: 90 tablet, Rfl: 3 .  Multiple Vitamins-Minerals (MULTIVITAMIN ADULT PO), Take 1 tablet by mouth daily., Disp: , Rfl:  .  Omega-3 Fatty Acids (FISH OIL) 1000 MG CAPS, Take 1 capsule by mouth daily. , Disp: , Rfl:  .  pantoprazole (PROTONIX) 40  MG tablet, Take 1 tablet (40 mg total) by mouth daily., Disp: 90 tablet, Rfl: 3 .  VITAMIN D PO, Take by mouth., Disp: , Rfl:  Social History   Socioeconomic History  . Marital status: Married    Spouse name: Not on file  . Number of children: 3   . Years of education: Not on file  . Highest education level: 8th grade  Occupational History  . Occupation: Retired   Scientific laboratory technician  . Financial resource strain: Not hard at all  . Food insecurity    Worry: Never true    Inability: Never true  . Transportation needs    Medical: No    Non-medical: No  Tobacco Use  . Smoking status: Never Smoker   . Smokeless tobacco: Never Used  Substance and Sexual Activity  . Alcohol use: Yes    Alcohol/week: 0.0 standard drinks    Comment: Rare  . Drug use: No  . Sexual activity: Yes    Birth control/protection: Post-menopausal    Comment: 1st intercourse 15 yo--1 partner  Lifestyle  . Physical activity    Days per week: 2 days    Minutes per session: 30 min  . Stress: Not at all  Relationships  . Social connections    Talks on phone: More than three times a week    Gets together: More than three times a week    Attends religious service: More than 4 times per year    Active member of club or organization: No    Attends meetings of clubs or organizations: Never    Relationship status: Married  . Intimate partner violence    Fear of current or ex partner: No    Emotionally abused: No    Physically abused: No    Forced sexual activity: No  Other Topics Concern  . Not on file  Social History Narrative   Occ caffeine drinks     Family History  Problem Relation Age of Onset  . Heart disease Mother   . Diabetes Brother        BKA  . COPD Father   . Asthma Father   . Alzheimer's disease Sister   . Alzheimer's disease Brother   . Multiple sclerosis Sister   . Heart disease Sister   . Diabetes Brother   . Early death Brother        died in war  . Colon cancer Neg Hx   . Kidney disease Neg Hx   . Liver disease Neg Hx     Objective: Office vital signs reviewed. BP 110/68   Pulse 76   Temp (!) 96.8 F (36 C) (Temporal)   Resp 16   Ht 5' 3"  (1.6 m)   Wt 153 lb 6.4 oz (69.6 kg)   SpO2 97%   BMI 27.17 kg/m   Physical Examination:  General: Awake, alert, well nourished, No acute distress HEENT: Normal, sclera white, MMM Cardio: regular rate and rhythm, S1S2 heard, no murmurs appreciated Pulm: clear to auscultation bilaterally, no wheezes, rhonchi or rales; normal work of breathing on room air Extremities: warm, well perfused, No edema, cyanosis or clubbing; +2 pulses  bilaterally MSK: slow/ stiff gait and station  Assessment/ Plan: 78 y.o. female   1. Essential hypertension Under excellent control.  Continue current regimen - CMP14+EGFR  2. Stage III chronic kidney disease (H[facility]) Check renal function panel, hemoglobin and vitamin D level - CMP14+EGFR - CBC - VITAMIN D 25 Hydroxy (Vit-D Deficiency, Fractures)  3. Pure hypercholesterolemia Check lipid - Lipid Panel  4. Pre-diabetes - Bayer DCA Hb A1c Waived  5. Primary osteoarthritis of right knee Encouraged physical activity.  Okay continue Voltaren gel.  Continue to follow-up with orthopedics as scheduled   Orders Placed This Encounter  Procedures  . CMP14+EGFR  . CBC  . VITAMIN D 25 Hydroxy (Vit-D Deficiency, Fractures)  . Lipid Panel  . Bayer DCA Hb A1c Waived   Meds ordered this encounter  Medications  . pantoprazole (PROTONIX) 40 MG tablet    Sig: Take 1 tablet (40 mg total) by mouth daily.    Dispense:  90 tablet    Refill:  [place], [place] [phone]

## 2019-05-14 NOTE — Patient Instructions (Signed)
Exercise Information for Aging Adults Staying physically active is important as you age. The four types of exercises that are best for older adults are endurance, strength, balance, and flexibility. Contact your health care provider before you start any exercise routine. Ask your health care provider what activities are safe for you. What are the risks? Risks associated with exercising include:  Overdoing it. This may lead to sore muscles or fatigue.  Falls.  Injuries.  Dehydration. How to do these exercises Endurance exercises Endurance (aerobic) exercises raise your breathing rate and heart rate. Increasing your endurance helps you to do everyday tasks and stay healthy. By improving the health of your body system that includes your heart, lungs, and blood vessels (circulatory system), you may also delay or prevent diseases such as heart disease, diabetes, and bone loss (osteoporosis). Types of endurance exercises include:  Sports.  Indoor activities, such as using gym equipment, doing water aerobics, or dancing.  Outdoor activities, such as biking or jogging.  Tasks around the house, such as gardening, yard work, and heavy household chores like cleaning.  Walking, such as hiking or walking around your neighborhood. When doing endurance exercises, make sure you:  Are aware of your surroundings.  Use safety equipment as directed.  Dress in layers when exercising outdoors.  Drink plenty of water to stay well hydrated. Build up endurance slowly. Start with 10 minutes at a time, and gradually build up to doing 30 minutes at a time. Unless your health care provider gave you different instructions, aim to exercise for a total of 150 minutes a week. Spread out that time so you are working on endurance on 3 or more days a week. Strength exercises Lifting, pulling, or pushing weights helps to strengthen muscles. Having stronger muscles makes it easier to do everyday activities, such as  getting up from a chair, climbing stairs, carrying groceries, and playing with grandchildren. Strength exercises include arm and leg exercises that may be done:  With weights.  Without weights (using your own body weight).  With a resistance band. When doing strength exercises:  Move smoothly and steadily. Do not suddenly thrust or jerk the weights, the resistance band, or your body.  Start with no weights or with light weights, and gradually add more weight over time. Eventually, aim to use weights that are hard or very hard for you to lift. This means that you are able to do 8 repetitions with the weight, and the last few repetitions are very challenging.  Lift or push weights into position for 3 seconds, hold the position for 1 second, and then take 3 seconds to return to your starting position.  Breathe out (exhale) during difficult movements, like lifting or pushing weights. Breathe in (inhale) to relax your muscles before the next repetition.  Consider alternating arms or legs, especially when you first start strength exercises.  Expect some slight muscle soreness after each session. Do strength exercises on 2 or more days a week, for 30 minutes at a time. Avoid exercising the same muscle groups two days in a row. For example, if you work on your leg muscles one day, work on your arm muscles the next day. When you can do two sets of 10-15 repetitions with a certain weight, increase the amount of weight. Balance Balance exercises can help to prevent falls. Balance exercises include:  Standing on one foot.  Heel-to-toe walk.  Balance walk.  Tai chi. Make sure you have something sturdy to hold onto while doing  balance exercises, such as a sturdy chair. As your balance improves, challenge yourself by holding onto the chair with one hand instead of two, and then with no hands. Trying exercises with your eyes closed also challenges your balance, but be sure to have a sturdy surface  (like a countertop) close by in case you need it. Do balance exercises as often as you want, or as often as directed by your health care provider. Strength exercises for the lower body also help to improve balance. Flexibility  Flexibility exercises improve how far you can bend, straighten, move, or rotate parts of your body (range of motion). These exercises also help you to do everyday activities such as getting dressed or reaching for objects. Flexibility exercises include stretching different parts of the body, and they may be done in a standing or seated position or on the floor. When stretching, make sure you:  Keep a slight bend in your arms and legs. Avoid completely straightening ("locking") your joints.  Do not stretch so far that you feel pain. You should feel a mild stretching feeling. You may try stretching farther as you become more flexible over time.  Relax and breathe between stretches.  Hold onto something sturdy for balance as needed. Hold each stretch for 10-30 seconds. Repeat each stretch 3-5 times. General safety tips  Exercise in well-lit areas.  Do not hold your breath during exercises or stretches.  Warm up before exercising, and cool down after exercising. This can help prevent injury.  Drink plenty of water during exercise or any activity that makes you sweat.  Use smooth, steady movements. Do not use sudden, jerking movements, especially when lifting weights or doing flexibility exercises.  If you are not sure if an exercise is safe for you, or you are not sure how to do an exercise, talk with your health care provider. This is especially important if you have had surgery on muscles, bones, or joints (orthopedic surgery). Where to find more information You can find more information about exercise for older adults from:  Your local health department, fitness center, or community center. These facilities may have programs for aging adults.  [employer] on Aging: http://kim-miller.com/  National Council on Aging: www.ncoa.org Summary  Staying physically active is important as you age.  Make sure to contact your health care provider before you start any exercise routine. Ask your health care provider what activities are safe for you.  Doing endurance, strength, balance, and flexibility exercises can help to delay or prevent certain diseases, such as heart disease, diabetes, and bone loss (osteoporosis). This information is not intended to replace advice given to you by your health care provider. Make sure you discuss any questions you have with your health care provider. Document Released: 01/03/2017 Document Revised: 06/06/2018 Document Reviewed: 01/03/2017 Elsevier Patient Education  2020 [employer].

## 2019-05-15 ENCOUNTER — Encounter: Payer: Self-pay | Admitting: *Deleted

## 2019-05-15 LAB — CBC
Hematocrit: 37.1 % (ref 34.0–46.6)
Hemoglobin: 12.7 g/dL (ref 11.1–15.9)
MCH: 30.3 pg (ref 26.6–33.0)
MCHC: 34.2 g/dL (ref 31.5–35.7)
MCV: 89 fL (ref 79–97)
Platelets: 221 10*3/uL (ref 150–450)
RBC: 4.19 x10E6/uL (ref 3.77–5.28)
RDW: 13 % (ref 11.7–15.4)
WBC: 5.6 10*3/uL (ref 3.4–10.8)

## 2019-05-15 LAB — LIPID PANEL
Chol/HDL Ratio: 4.4 ratio (ref 0.0–4.4)
Cholesterol, Total: 169 mg/dL (ref 100–199)
HDL: 38 mg/dL — ABNORMAL LOW
LDL Chol Calc (NIH): 93 mg/dL (ref 0–99)
Triglycerides: 221 mg/dL — ABNORMAL HIGH (ref 0–149)
VLDL Cholesterol Cal: 38 mg/dL (ref 5–40)

## 2019-05-15 LAB — CMP14+EGFR
ALT: 17 [IU]/L (ref 0–32)
AST: 22 [IU]/L (ref 0–40)
Albumin/Globulin Ratio: 1.6 (ref 1.2–2.2)
Albumin: 4.3 g/dL (ref 3.7–4.7)
Alkaline Phosphatase: 53 [IU]/L (ref 39–117)
BUN/Creatinine Ratio: 17 (ref 12–28)
BUN: 25 mg/dL (ref 8–27)
Bilirubin Total: 0.4 mg/dL (ref 0.0–1.2)
CO2: 27 mmol/L (ref 20–29)
Calcium: 10.1 mg/dL (ref 8.7–10.3)
Chloride: 98 mmol/L (ref 96–106)
Creatinine, Ser: 1.48 mg/dL — ABNORMAL HIGH (ref 0.57–1.00)
GFR calc Af Amer: 39 mL/min/{1.73_m2} — ABNORMAL LOW
GFR calc non Af Amer: 34 mL/min/{1.73_m2} — ABNORMAL LOW
Globulin, Total: 2.7 g/dL (ref 1.5–4.5)
Glucose: 110 mg/dL — ABNORMAL HIGH (ref 65–99)
Potassium: 4 mmol/L (ref 3.5–5.2)
Sodium: 140 mmol/L (ref 134–144)
Total Protein: 7 g/dL (ref 6.0–8.5)

## 2019-05-15 LAB — VITAMIN D 25 HYDROXY (VIT D DEFICIENCY, FRACTURES): Vit D, 25-Hydroxy: 48.6 ng/mL (ref 30.0–100.0)

## 2019-06-05 ENCOUNTER — Encounter: Payer: Self-pay | Admitting: Gynecology

## 2019-06-25 ENCOUNTER — Other Ambulatory Visit: Payer: Self-pay

## 2019-06-26 ENCOUNTER — Ambulatory Visit (INDEPENDENT_AMBULATORY_CARE_PROVIDER_SITE_OTHER): Payer: Medicare Other

## 2019-06-26 DIAGNOSIS — Z23 Encounter for immunization: Secondary | ICD-10-CM

## 2019-07-17 ENCOUNTER — Telehealth: Payer: Self-pay | Admitting: Family Medicine

## 2019-07-17 DIAGNOSIS — J0100 Acute maxillary sinusitis, unspecified: Secondary | ICD-10-CM

## 2019-07-17 MED ORDER — FLUTICASONE PROPIONATE 50 MCG/ACT NA SUSP: 1.0000 | Freq: Two times a day (BID) | NASAL | 6 refills | Status: AC | PRN

## 2019-07-17 NOTE — Telephone Encounter (Signed)
meds sent in patient aware. °

## 2019-07-17 NOTE — Telephone Encounter (Signed)
What is the name of the medication? fluticasone (FLONASE) 50 MCG/ACT nasal spray 90 day supply  Have you contacted your pharmacy to request a refill? no  Which pharmacy would you like this sent to? [facility]   Patient notified that their request is being sent to the clinical staff for review and that they should receive a call once it is complete. If they do not receive a call within 24 hours they can check with their pharmacy or our office.

## 2019-07-19 ENCOUNTER — Other Ambulatory Visit: Payer: Self-pay | Admitting: Family Medicine

## 2019-10-13 ENCOUNTER — Ambulatory Visit (INDEPENDENT_AMBULATORY_CARE_PROVIDER_SITE_OTHER): Payer: Medicare Other

## 2019-10-13 DIAGNOSIS — Z0000 Encounter for general adult medical examination without abnormal findings: Secondary | ICD-10-CM

## 2019-10-13 NOTE — Progress Notes (Signed)
MEDICARE ANNUAL WELLNESS VISIT  10/13/2019  Telephone Visit Disclaimer This Medicare AWV was conducted by telephone due to national recommendations for restrictions regarding the COVID-19 Pandemic (e.g. social distancing).  I verified, using two identifiers, that I am speaking with Kimberly Suarez or their authorized healthcare agent. I discussed the limitations, risks, security, and privacy concerns of performing an evaluation and management service by telephone and the potential availability of an in-person appointment in the future. The patient expressed understanding and agreed to proceed.   Subjective:  Kimberly Suarez is a 79 y.o. female patient of [name], DO who had a Medicare Annual Wellness Visit today via telephone. Kimberly Suarez is Retired and lives with their spouse. she has 3 children. she reports that she is socially active and does interact with friends/family regularly. she is minimally physically active and enjoys watching television.  Patient Care Team: [name], DO as PCP - General (Family Medicine) Pyrtle, Lajuan Lines, MD as Consulting Physician (Gastroenterology) Phineas Real Belinda Block, MD as Consulting Physician (Gynecology)  Advanced Directives 10/09/2018 06/01/2016 12/20/2015 06/09/2015 06/01/2014  Does Patient Have a Medical Advance Directive? No No Yes No No  Type of Advance Directive - - [place]  Would patient like information on creating a medical advance directive? No - Patient declined No - patient declined information - No - patient declined information Yes - Educational materials given    Hospital Utilization Over the Past 12 Months: # of hospitalizations or ER visits: 0 # of surgeries: 0  Review of Systems    Patient reports that her overall health is unchanged compared to last year.   Patient Reported Readings (BP, Pulse, CBG, Weight, etc) Pt does not take readings at home.  Pain Assessment       Current  Medications & Allergies (verified) Allergies as of 10/13/2019   No Known Allergies     Medication List       Accurate as of October 13, 2019 10:47 AM. If you have any questions, ask your nurse or doctor.        AMBULATORY NON FORMULARY MEDICATION Medication Name: Omega XL, take 2 capsules daily for joints, etc   aspirin 81 MG tablet Take 81 mg by mouth daily.   atorvastatin 40 MG tablet Commonly known as: LIPITOR TAKE 1 TABLET BY MOUTH  DAILY   b complex vitamins capsule Take 1 capsule by mouth daily.   enalapril 10 MG tablet Commonly known as: VASOTEC TAKE 1 TABLET BY MOUTH TWO  TIMES DAILY   famotidine 20 MG tablet Commonly known as: Pepcid Take 1 tablet (20 mg total) by mouth 2 (two) times daily.   Fish Oil 1000 MG Caps Take 1 capsule by mouth daily.   fluticasone 50 MCG/ACT nasal spray Commonly known as: FLONASE Place 1 spray into both nostrils 2 (two) times daily as needed for allergies or rhinitis.   GLUCOSAMINE CHONDR COMPLEX PO Take 1 tablet by mouth 2 (two) times daily.   hydrochlorothiazide 25 MG tablet Commonly known as: HYDRODIURIL TAKE 1 TABLET BY MOUTH  DAILY   MULTIVITAMIN ADULT PO Take 1 tablet by mouth daily.   pantoprazole 40 MG tablet Commonly known as: PROTONIX Take 1 tablet (40 mg total) by mouth daily.   TUMS PLUS PO Take 1 tablet by mouth as needed.   VITAMIN D PO Take by mouth.       History (reviewed): Past Medical History:  Diagnosis Date  . ASCUS (  atypical squamous cells of undetermined significance) on Pap smear 2011x2   Negative high-risk HPV, normal Pap smears 2012/2013  . Atypical nevus 05/02/2007   right back-moderate   . Cancer ([place])   . Cataract   . Diverticulosis   . GERD (gastroesophageal reflux disease)   . Hiatal hernia   . Hyperlipemia   . Hypertension   . IBS (irritable bowel syndrome)   . Lymphocytic colitis 2007  . SCC (squamous cell carcinoma) 01/19/2006   bridge of nose (CX35FU)  . SCC  (squamous cell carcinoma) 03/08/2017   right inner lower shin (CX35FU)  . SCC (squamous cell carcinoma) x 2 02/24/2002   right bridge of nose (Cx35FU),right bridge of nose  . Uterine prolapse    Pessary   Past Surgical History:  Procedure Laterality Date  . BREAST BIOPSY     unsure which breast  . CATARACT EXTRACTION W/ INTRAOCULAR LENS IMPLANT Bilateral    Family History  Problem Relation Age of Onset  . Heart disease Mother   . Diabetes Brother        BKA  . COPD Father   . Asthma Father   . Alzheimer's disease Sister   . Alzheimer's disease Brother   . Multiple sclerosis Sister   . Heart disease Sister   . Diabetes Brother   . Early death Brother        died in war  . Colon cancer Neg Hx   . Kidney disease Neg Hx   . Liver disease Neg Hx    Social History   Socioeconomic History  . Marital status: Married    Spouse name: Not on file  . Number of children: 3   . Years of education: Not on file  . Highest education level: 8th grade  Occupational History  . Occupation: Retired   Tobacco Use  . Smoking status: Never Smoker  . Smokeless tobacco: Never Used  Substance and Sexual Activity  . Alcohol use: Yes    Alcohol/week: 0.0 standard drinks    Comment: Rare  . Drug use: No  . Sexual activity: Yes    Birth control/protection: Post-menopausal    Comment: 1st intercourse 80 yo--1 partner  Other Topics Concern  . Not on file  Social History Narrative   Occ caffeine drinks     Social Determinants of Health   Financial Resource Strain:   . Difficulty of Paying Living Expenses: Not on file  Food Insecurity:   . Worried About Charity fundraiser in the Last Year: Not on file  . Ran Out of Food in the Last Year: Not on file  Transportation Needs:   . Lack of Transportation (Medical): Not on file  . Lack of Transportation (Non-Medical): Not on file  Physical Activity:   . Days of Exercise per Week: Not on file  . Minutes of Exercise per Session: Not on file   Stress:   . Feeling of Stress : Not on file  Social Connections:   . Frequency of Communication with Friends and Family: Not on file  . Frequency of Social Gatherings with Friends and Family: Not on file  . Attends Religious Services: Not on file  . Active Member of Clubs or Organizations: Not on file  . Attends Archivist Meetings: Not on file  . Marital Status: Not on file    Activities of Daily Living In your present state of health, do you have any difficulty performing the following activities: 10/13/2019  Hearing? N  Vision? Y  Comment Cataract surgery in the past. Wears glasses daily now.  Difficulty concentrating or making decisions? Y  Comment Now and then. A little forgetful  Walking or climbing stairs? N  Dressing or bathing? N  Doing errands, shopping? N  Preparing Food and eating ? N  Using the Toilet? N  In the past six months, have you accidently leaked urine? N  Do you have problems with loss of bowel control? N  Managing your Medications? N  Managing your Finances? N  Housekeeping or managing your Housekeeping? N  Some recent data might be hidden    Patient Education/ Literacy    Exercise Current Exercise Habits: Home exercise routine, Type of exercise: walking, Time (Minutes): 25, Frequency (Times/Week): 3, Weekly Exercise (Minutes/Week): 75, Intensity: Mild, Exercise limited by: orthopedic condition(s)  Diet Patient reports consuming 3 meals a day and 2 snack(s) a day Patient reports that her primary diet is: Regular Patient reports that she does have regular access to food.   Depression Screen PHQ 2/9 Scores 11/04/2018 10/09/2018 09/02/2018 05/07/2018 03/04/2018 11/07/2017 05/09/2017  PHQ - 2 Score 0 0 0 0 1 0 0     Fall Risk Fall Risk  10/13/2019 11/04/2018 10/09/2018 09/02/2018 05/07/2018  Falls in the past year? 0 0 0 0 No     Objective:  Kimberly Suarez seemed alert and oriented and she participated appropriately during our telephone  visit.  Blood Pressure Weight BMI  BP Readings from Last 3 Encounters:  05/14/19 110/68  03/10/19 124/84  11/04/18 101/63   Wt Readings from Last 3 Encounters:  05/14/19 153 lb 6.4 oz (69.6 kg)  03/10/19 155 lb (70.3 kg)  11/04/18 150 lb (68 kg)   BMI Readings from Last 1 Encounters:  05/14/19 27.17 kg/m    *Unable to obtain current vital signs, weight, and BMI due to telephone visit type  Hearing/Vision  . Kimberly Suarez did  seem to have difficulty with hearing/understanding during the telephone conversation . Reports that she has had a formal eye exam by an eye care professional within the past year . Reports that she has not had a formal hearing evaluation within the past year *Unable to fully assess hearing and vision during telephone visit type  Cognitive Function: 6CIT Screen 10/13/2019  What Year? 0 points  What month? 0 points  What time? 0 points  Count back from 20 0 points  Months in reverse 0 points  Repeat phrase 2 points  Total Score 2   (Normal:0-7, Significant for Dysfunction: >8)  Normal Cognitive Function Screening: Yes   Immunization & Health Maintenance Record Immunization History  Administered Date(s) Administered  . Fluad Quad(high Dose 65+) 06/26/2019  . Influenza, High Dose Seasonal PF 06/28/2015, 06/01/2016, 06/06/2017, 06/04/2018  . Influenza,inj,Quad PF,6+ Mos 07/02/2013, 05/26/2014  . Pneumococcal Conjugate-13 07/02/2013  . Tdap 05/29/2011    Health Maintenance  Topic Date Due  . PNA vac Low Risk Adult (2 of 2 - PPSV23) 05/13/2020 (Originally 07/02/2014)  . DEXA SCAN  11/08/2020  . TETANUS/TDAP  05/28/2021  . INFLUENZA VACCINE  Completed       Assessment  This is a routine wellness examination for Kimberly Suarez.  Health Maintenance: Due or Overdue There are no preventive care reminders to display for this patient.  Kimberly Suarez does not need a referral for Community Assistance: Care Management:   no Social  Work:    no Prescription Assistance:  no Nutrition/Diabetes Education:  no   Plan:  Personalized Goals  Addressed  Personalized Health Maintenance & Screening Recommendations  Pt may require an eye exam. States that she had cataract surgery in the past. Vision has become cloudy. Admits to wearing glasses daily currently. Pt reports seeing Dr. Gershon Crane between 2018-2019.  Lung Cancer Screening Recommended: no (Low Dose CT Chest recommended if Age 68-80 years, 30 pack-year currently smoking OR have quit w/in past 15 years) Hepatitis C Screening recommended: no HIV Screening recommended: no  Advanced Directives: Written information was not prepared per patient's request.  Referrals & Orders No orders of the defined types were placed in this encounter.   Follow-up Plan . Follow-up with [name], DO as planned . Scheduled March 8th 2021. 6 month follow up.    I have personally reviewed and noted the following in the patient's chart:   . Medical and social history . Use of alcohol, tobacco or illicit drugs  . Current medications and supplements . Functional ability and status . Nutritional status . Physical activity . Advanced directives . List of other physicians . Hospitalizations, surgeries, and ER visits in previous 12 months . Vitals . Screenings to include cognitive, depression, and falls . Referrals and appointments  In addition, I have reviewed and discussed with Kimberly Suarez certain preventive protocols, quality metrics, and best practice recommendations. A written personalized care plan for preventive services as well as general preventive health recommendations is available and can be mailed to the patient at her request.      Maud Deed [facility]  [id number]

## 2019-11-03 ENCOUNTER — Ambulatory Visit (INDEPENDENT_AMBULATORY_CARE_PROVIDER_SITE_OTHER): Payer: Medicare Other | Admitting: Family Medicine

## 2019-11-03 ENCOUNTER — Encounter: Payer: Self-pay | Admitting: Family Medicine

## 2019-11-03 ENCOUNTER — Other Ambulatory Visit: Payer: Self-pay

## 2019-11-03 VITALS — BP 134/80 | HR 82 | Temp 98.0°F | Ht 63.0 in | Wt 158.0 lb

## 2019-11-03 DIAGNOSIS — M25561 Pain in right knee: Secondary | ICD-10-CM

## 2019-11-03 DIAGNOSIS — E7800 Pure hypercholesterolemia, unspecified: Secondary | ICD-10-CM | POA: Diagnosis not present

## 2019-11-03 DIAGNOSIS — R7303 Prediabetes: Secondary | ICD-10-CM | POA: Diagnosis not present

## 2019-11-03 DIAGNOSIS — G8929 Other chronic pain: Secondary | ICD-10-CM | POA: Diagnosis not present

## 2019-11-03 DIAGNOSIS — N1832 Chronic kidney disease, stage 3b: Secondary | ICD-10-CM | POA: Diagnosis not present

## 2019-11-03 DIAGNOSIS — I10 Essential (primary) hypertension: Secondary | ICD-10-CM

## 2019-11-03 DIAGNOSIS — I129 Hypertensive chronic kidney disease with stage 1 through stage 4 chronic kidney disease, or unspecified chronic kidney disease: Secondary | ICD-10-CM | POA: Diagnosis not present

## 2019-11-03 LAB — BAYER DCA HB A1C WAIVED: HB A1C (BAYER DCA - WAIVED): 6.5 %

## 2019-11-03 MED ORDER — METHYLPREDNISOLONE ACETATE 40 MG/ML IJ SUSP
40.0000 mg | Freq: Once | INTRAMUSCULAR | Status: AC
  Administered 2019-11-03: 40 mg via INTRA_ARTICULAR

## 2019-11-03 MED ORDER — HYDROCHLOROTHIAZIDE 25 MG PO TABS: 25.0000 mg | ORAL_TABLET | Freq: Every day | ORAL | 1 refills | Status: DC

## 2019-11-03 MED ORDER — ENALAPRIL MALEATE 10 MG PO TABS: 10.0000 mg | ORAL_TABLET | Freq: Two times a day (BID) | ORAL | 1 refills | Status: DC

## 2019-11-03 MED ORDER — ATORVASTATIN CALCIUM 40 MG PO TABS: 40.0000 mg | ORAL_TABLET | Freq: Every day | ORAL | 1 refills | Status: DC

## 2019-11-03 NOTE — Addendum Note (Signed)
Addended by: Karle Plumber on: 11/03/2019 03:02 PM   Modules accepted: Orders

## 2019-11-03 NOTE — Patient Instructions (Signed)
Knee Injection A knee injection is a procedure to get medicine into your knee joint to relieve the pain, swelling, and stiffness of arthritis. Your health care provider uses a needle to inject medicine, which may also help to lubricate and cushion your knee joint. You may need more than one injection. Tell a health care provider about:  Any allergies you have.  All medicines you are taking, including vitamins, herbs, eye drops, creams, and over-the-counter medicines.  Any problems you or family members have had with anesthetic medicines.  Any blood disorders you have.  Any surgeries you have had.  Any medical conditions you have.  Whether you are pregnant or may be pregnant. What are the risks? Generally, this is a safe procedure. However, problems may occur, including:  Infection.  Bleeding.  Symptoms that get worse.  Damage to the area around your knee.  Allergic reaction to any of the medicines.  Skin reactions from repeated injections. What happens before the procedure?  Ask your health care provider about changing or stopping your regular medicines. This is especially important if you are taking diabetes medicines or blood thinners.  Plan to have someone take you home from the hospital or clinic. What happens during the procedure?   You will sit or lie down in a position for your knee to be treated.  The skin over your kneecap will be cleaned with a germ-killing soap.  You will be given a medicine that numbs the area (local anesthetic). You may feel some stinging.  The medicine will be injected into your knee. The needle is carefully placed between your kneecap and your knee. The medicine is injected into the joint space.  The needle will be removed at the end of the procedure.  A bandage (dressing) may be placed over the injection site. The procedure may vary among health care providers and hospitals. What can I expect after the procedure?  Your blood  pressure, heart rate, breathing rate, and blood oxygen level will be monitored until you leave the hospital or clinic.  You may have to move your knee through its full range of motion. This helps to get all the medicine into your joint space.  You will be watched to make sure that you do not have a reaction to the injected medicine.  You may feel more pain, swelling, and warmth than you did before the injection. This reaction may last about 1-2 days. Follow these instructions at home: Medicines  Take over-the-counter and prescription medicines only as told by your doctor.  Do not drive or use heavy machinery while taking prescription pain medicine.  Do not take medicines such as aspirin and ibuprofen unless your health care provider tells you to take them. Injection site care  Follow instructions from your health care provider about: ? How to take care of your puncture site. ? When and how you should change your dressing. ? When you should remove your dressing.  Check your injection area every day for signs of infection. Check for: ? More redness, swelling, or pain after 2 days. ? Fluid or blood. ? Pus or a bad smell. ? Warmth. Managing pain, stiffness, and swelling   If directed, put ice on the injection area: ? Put ice in a plastic bag. ? Place a towel between your skin and the bag. ? Leave the ice on for 20 minutes, 2-3 times per day.  Do not apply heat to your knee.  Raise (elevate) the injection area above the level   of your heart while you are sitting or lying down. General instructions  If you were given a dressing, keep it dry until your health care provider says it can be removed. Ask your health care provider when you can start showering or taking a bath.  Avoid strenuous activities for as long as directed by your health care provider. Ask your health care provider when you can return to your normal activities.  Keep all follow-up visits as told by your health  care provider. This is important. You may need more injections. Contact a health care provider if you have:  A fever.  Warmth in your injection area.  Fluid, blood, or pus coming from your injection site.  Symptoms at your injection site that last longer than 2 days after your procedure. Get help right away if:  Your knee: ? Turns very red. ? Becomes very swollen. ? Is in severe pain. Summary  A knee injection is a procedure to get medicine into your knee joint to relieve the pain, swelling, and stiffness of arthritis.  A needle is carefully placed between your kneecap and your knee to inject medicine into the joint space.  Before the procedure, ask your health care provider about changing or stopping your regular medicines, especially if you are taking diabetes medicines or blood thinners.  Contact your health care provider if you have any problems or questions after your procedure. This information is not intended to replace advice given to you by your health care provider. Make sure you discuss any questions you have with your health care provider. Document Revised: 09/03/2017 Document Reviewed: 09/03/2017 Elsevier Patient Education  2020 [employer]  

## 2019-11-03 NOTE — Progress Notes (Signed)
Subjective: CC: Hypertension with hyperlipidemia, GERD PCP: [name], DO [id number] Kimberly Suarez is a 79 y.o. female presenting to clinic today for:  1.  Hypertension with hyperlipidemia; CKD3 Patient reports compliance with her enalapril 10 mg daily, hydrochlorothiazide 25 mg daily and Lipitor 40 mg daily.  Always refrain from oral NSAID use.  No chest pain, shortness of breath, lower extremity edema, falls.  2.  Degenerative joint disease Patient reports degenerative joint in right knee.  She has posterior knee pain.  Pain seems to be exacerbated by going up stairs.  She has history of arthroscopy.  She wants to try corticosteroid injection again.  She does note that the previous one that was done by [facility] orthopedic was not especially helpful but because knee pain is worsening she wants to try again.  She has been using Voltaren gel intermittently with various degrees of relief.  She was told that she needs a knee replacement but they did not recommend knee replacement.   ROS: Per HPI  No Known Allergies Past Medical History:  Diagnosis Date  . ASCUS (atypical squamous cells of undetermined significance) on Pap smear 2011x2   Negative high-risk HPV, normal Pap smears 2012/2013  . Atypical nevus 05/02/2007   right back-moderate   . Cancer ([place])   . Cataract   . Diverticulosis   . GERD (gastroesophageal reflux disease)   . Hiatal hernia   . Hyperlipemia   . Hypertension   . IBS (irritable bowel syndrome)   . Lymphocytic colitis 2007  . SCC (squamous cell carcinoma) 01/19/2006   bridge of nose (CX35FU)  . SCC (squamous cell carcinoma) 03/08/2017   right inner lower shin (CX35FU)  . SCC (squamous cell carcinoma) x 2 02/24/2002   right bridge of nose (Cx35FU),right bridge of nose  . Uterine prolapse    Pessary    Current Outpatient Medications:  .  aspirin 81 MG tablet, Take 81 mg by mouth daily.  , Disp: , Rfl:  .  atorvastatin (LIPITOR) 40 MG tablet, TAKE 1 TABLET  BY MOUTH  DAILY, Disp: 90 tablet, Rfl: 1 .  b complex vitamins capsule, Take 1 capsule by mouth daily., Disp: , Rfl:  .  enalapril (VASOTEC) 10 MG tablet, TAKE 1 TABLET BY MOUTH TWO  TIMES DAILY, Disp: 180 tablet, Rfl: 1 .  fluticasone (FLONASE) 50 MCG/ACT nasal spray, Place 1 spray into both nostrils 2 (two) times daily as needed for allergies or rhinitis., Disp: 16 g, Rfl: 6 .  gabapentin (NEURONTIN) 100 MG capsule, Take 200 mg by mouth at bedtime., Disp: , Rfl:  .  Glucosamine-Chondroitin (GLUCOSAMINE CHONDR COMPLEX PO), Take 1 tablet by mouth 2 (two) times daily. , Disp: , Rfl:  .  hydrochlorothiazide (HYDRODIURIL) 25 MG tablet, TAKE 1 TABLET BY MOUTH  DAILY, Disp: 90 tablet, Rfl: 1 .  Multiple Vitamins-Minerals (MULTIVITAMIN ADULT PO), Take 1 tablet by mouth daily., Disp: , Rfl:  .  Omega-3 Fatty Acids (FISH OIL) 1000 MG CAPS, Take 1 capsule by mouth daily. , Disp: , Rfl:  .  pantoprazole (PROTONIX) 40 MG tablet, Take 1 tablet (40 mg total) by mouth daily., Disp: 90 tablet, Rfl: 3 .  VITAMIN D PO, Take by mouth., Disp: , Rfl:  Social History   Socioeconomic History  . Marital status: Married    Spouse name: Not on file  . Number of children: 3   . Years of education: Not on file  . Highest education level: 8th grade  Occupational History  .  Occupation: Retired   Tobacco Use  . Smoking status: Never Smoker  . Smokeless tobacco: Never Used  Substance and Sexual Activity  . Alcohol use: Yes    Alcohol/week: 0.0 standard drinks    Comment: Rare  . Drug use: No  . Sexual activity: Yes    Birth control/protection: Post-menopausal    Comment: 1st intercourse 14 yo--1 partner  Other Topics Concern  . Not on file  Social History Narrative   Occ caffeine drinks     Social Determinants of Health   Financial Resource Strain:   . Difficulty of Paying Living Expenses: Not on file  Food Insecurity:   . Worried About Charity fundraiser in the Last Year: Not on file  . Ran Out of Food  in the Last Year: Not on file  Transportation Needs:   . Lack of Transportation (Medical): Not on file  . Lack of Transportation (Non-Medical): Not on file  Physical Activity:   . Days of Exercise per Week: Not on file  . Minutes of Exercise per Session: Not on file  Stress:   . Feeling of Stress : Not on file  Social Connections:   . Frequency of Communication with Friends and Family: Not on file  . Frequency of Social Gatherings with Friends and Family: Not on file  . Attends Religious Services: Not on file  . Active Member of Clubs or Organizations: Not on file  . Attends Archivist Meetings: Not on file  . Marital Status: Not on file  Intimate Partner Violence:   . Fear of Current or Ex-Partner: Not on file  . Emotionally Abused: Not on file  . Physically Abused: Not on file  . Sexually Abused: Not on file   Family History  Problem Relation Age of Onset  . Heart disease Mother   . Diabetes Brother        BKA  . COPD Father   . Asthma Father   . Alzheimer's disease Sister   . Alzheimer's disease Brother   . Multiple sclerosis Sister   . Heart disease Sister   . Diabetes Brother   . Early death Brother        died in war  . Colon cancer Neg Hx   . Kidney disease Neg Hx   . Liver disease Neg Hx     Objective: Office vital signs reviewed. BP 134/80   Pulse 82   Temp 98 F (36.7 C) (Temporal)   Ht 5\' 3"  (1.6 m)   Wt 158 lb (71.7 kg)   SpO2 97%   BMI 27.99 kg/m   Physical Examination:  General: Awake, alert, well nourished, No acute distress HEENT: Normal, sclera white, MMM; no carotid bruits Cardio: regular rate and rhythm, S1S2 heard, no murmurs appreciated Pulm: clear to auscultation bilaterally, no wheezes, rhonchi or rales; normal work of breathing on room air Extremities: warm, well perfused, No edema, cyanosis or clubbing; +2 pulses bilaterally MSK: slow/ stiff gait and station  Right knee: mild joint effusion noted medially.  She has full  AROM.  No tenderness palpation to the patella, patellar tendon, quad tendon.  No joint line tenderness palpation.  No ligamentous laxity.  No palpable popliteal masses.  No joint warmth or erythema.  JOINT INJECTION:  Patient denies allergy to antiseptics (including iodine) and anesthetics.  Patient denies h/o diabetes, frequent steroid use, use of blood thinners/ antiplatelets.  Patient was given informed consent and a signed copy has been placed in the  chart. Appropriate time out was taken. Area prepped and draped in usual sterile fashion. Anatomic landmarks were identified and injection site was marked.  Ethyl chloride spray was used to numb the area and 1 cc of methylprednisolone 40 mg/ml plus  3 cc of 1% lidocaine without epinephrine was injected into the right knee using a(n) anteriomedial approach. The patient tolerated the procedure well and there were no immediate complications. Estimated blood loss is less than 1 cc.  Post procedure instructions were reviewed and handout outlining these instructions were provided to patient.   Assessment/ Plan: 79 y.o. female   1. Essential hypertension Well controlled - Basic Metabolic Panel  2. Stage 3b chronic kidney disease Check BMP - Basic Metabolic Panel  3. Pure hypercholesterolemia Check fasting lipid panel given elevation in TGs last check - Lipid Panel - TSH  4. Pre-diabetes A1c was 6.3 last visit. Check A1c - Bayer DCA Hb A1c Waived  5. Chronic pain of right knee Corticosteroid injection administered.  No immediate complications.  Home care instructions reviewed.    No orders of the defined types were placed in this encounter.  No orders of the defined types were placed in this encounter.    [name], DO [place] [phone]

## 2019-11-04 LAB — BASIC METABOLIC PANEL WITH GFR
BUN/Creatinine Ratio: 15 (ref 12–28)
BUN: 26 mg/dL (ref 8–27)
CO2: 27 mmol/L (ref 20–29)
Calcium: 10.2 mg/dL (ref 8.7–10.3)
Chloride: 97 mmol/L (ref 96–106)
Creatinine, Ser: 1.69 mg/dL — ABNORMAL HIGH (ref 0.57–1.00)
GFR calc Af Amer: 33 mL/min/{1.73_m2} — ABNORMAL LOW
GFR calc non Af Amer: 29 mL/min/{1.73_m2} — ABNORMAL LOW
Glucose: 102 mg/dL — ABNORMAL HIGH (ref 65–99)
Potassium: 4 mmol/L (ref 3.5–5.2)
Sodium: 138 mmol/L (ref 134–144)

## 2019-11-04 LAB — LIPID PANEL
Chol/HDL Ratio: 3.3 ratio (ref 0.0–4.4)
Cholesterol, Total: 156 mg/dL (ref 100–199)
HDL: 47 mg/dL
LDL Chol Calc (NIH): 80 mg/dL (ref 0–99)
Triglycerides: 173 mg/dL — ABNORMAL HIGH (ref 0–149)
VLDL Cholesterol Cal: 29 mg/dL (ref 5–40)

## 2019-11-04 LAB — TSH: TSH: 2.65 u[IU]/mL (ref 0.450–4.500)

## 2019-11-13 ENCOUNTER — Other Ambulatory Visit: Payer: Medicare Other

## 2019-11-13 ENCOUNTER — Ambulatory Visit: Payer: Medicare Other

## 2019-11-13 ENCOUNTER — Other Ambulatory Visit: Payer: Self-pay

## 2019-11-13 DIAGNOSIS — N1832 Chronic kidney disease, stage 3b: Secondary | ICD-10-CM

## 2019-11-13 DIAGNOSIS — I10 Essential (primary) hypertension: Secondary | ICD-10-CM

## 2019-11-13 NOTE — Progress Notes (Signed)
Patient here today for blood pressure check.  Blood pressure 136/82, pulse 88.

## 2019-11-14 LAB — BMP8+EGFR
BUN/Creatinine Ratio: 16 (ref 12–28)
BUN: 27 mg/dL (ref 8–27)
CO2: 28 mmol/L (ref 20–29)
Calcium: 10.5 mg/dL — ABNORMAL HIGH (ref 8.7–10.3)
Chloride: 97 mmol/L (ref 96–106)
Creatinine, Ser: 1.73 mg/dL — ABNORMAL HIGH (ref 0.57–1.00)
GFR calc Af Amer: 32 mL/min/{1.73_m2} — ABNORMAL LOW
GFR calc non Af Amer: 28 mL/min/{1.73_m2} — ABNORMAL LOW
Glucose: 87 mg/dL (ref 65–99)
Potassium: 4.5 mmol/L (ref 3.5–5.2)
Sodium: 140 mmol/L (ref 134–144)

## 2019-11-26 ENCOUNTER — Other Ambulatory Visit: Payer: Self-pay | Admitting: Family Medicine

## 2019-11-26 ENCOUNTER — Telehealth: Payer: Self-pay | Admitting: Family Medicine

## 2019-11-26 DIAGNOSIS — R7989 Other specified abnormal findings of blood chemistry: Secondary | ICD-10-CM

## 2019-11-26 DIAGNOSIS — N1832 Chronic kidney disease, stage 3b: Secondary | ICD-10-CM

## 2019-11-26 NOTE — Telephone Encounter (Signed)
  REFERRAL REQUEST Telephone Note 11/26/2019  What type of referral do you need? Nephrology  Have you been seen at our office for this problem? Yes - Patient was seen on 11/03/2019 and stated it was talked about in that visit. (Advise that they may need an appointment with their PCP before a referral can be done)  Is there a particular doctor or location that you prefer? [facility].  Patient notified that referrals can take up to a week or longer to process. If they haven't heard anything within a week they should call back and speak with the referral department.

## 2019-12-10 ENCOUNTER — Telehealth: Payer: Self-pay | Admitting: Family Medicine

## 2019-12-10 NOTE — Chronic Care Management (AMB) (Signed)
  Chronic Care Management   Outreach Note  12/10/2019 Name: Kimberly Suarez MRN: [id number] DOB: 06-30-1941  Kimberly Suarez is a 79 y.o. year old female who is a primary care patient of [name], DO. I reached out to [name] by phone today in response to a referral sent by Ms. Toma De[employer] [facility] health plan.     An unsuccessful telephone outreach was attempted today. The patient was referred to the case management team for assistance with care management and care coordination.   Follow Up Plan: A HIPPA compliant phone message was left for the patient providing contact information and requesting a return call.  The care management team will reach out to the patient again over the next 7 days.  If patient returns call to provider office, please advise to call [place] at [phone].  [place], [place] [address] Direct Dial: [phone] [email or web address] Website: [email or web address]

## 2019-12-12 NOTE — Chronic Care Management (AMB) (Signed)
  Chronic Care Management   Outreach Note  12/12/2019 Name: Kimberly Suarez MRN: [id number] DOB: 10-Jan-1941  Kimberly Suarez is a 79 y.o. year old female who is a primary care patient of [name], DO. I reached out to [name] by phone today in response to a referral sent by Ms. Toma [facility] health plan.     A second unsuccessful telephone outreach was attempted today. The patient was referred to the case management team for assistance with care management and care coordination.   Follow Up Plan: A HIPPA compliant phone message was left for the patient providing contact information and requesting a return call. The care management team will reach out to the patient again over the next 7 days. If patient returns call to provider office, please advise to call [place] at [phone].  [place], [place] [address] Direct Dial: [phone] [email or web address] Website: [email or web address]

## 2019-12-15 NOTE — Chronic Care Management (AMB) (Signed)
  Chronic Care Management   Note  12/15/2019 Name: TINAMARIE PRZYBYLSKI MRN: [id number] DOB: [birth year]  NEVAE PINNIX is a 79 y.o. year old female who is a primary care patient of [name], DO. I reached out to [name] by phone today in response to a referral sent by Ms. Toma Deiters [facility] health plan.     Ms. Labine was given information about Chronic Care Management services today including:  1. CCM service includes personalized support from designated clinical staff supervised by her physician, including individualized plan of care and coordination with other care providers 2. 24/7 contact phone numbers for assistance for urgent and routine care needs. 3. Service will only be billed when office clinical staff spend 20 minutes or more in a month to coordinate care. 4. Only one practitioner may furnish and bill the service in a calendar month. 5. The patient may stop CCM services at any time (effective at the end of the month) by phone call to the office staff. 6. The patient will be responsible for cost sharing (co-pay) of up to 20% of the service fee (after annual deductible is met).  Patient agreed to services and verbal consent obtained.   Follow up plan: Telephone appointment with care management team member scheduled for: 01/21/2020.  [place], [place] [address] Direct Dial: [phone] [email or web address] Website: [email or web address]

## 2019-12-18 ENCOUNTER — Other Ambulatory Visit: Payer: Self-pay

## 2019-12-18 ENCOUNTER — Ambulatory Visit: Payer: Medicare Other | Admitting: Physician Assistant

## 2019-12-18 ENCOUNTER — Encounter: Payer: Self-pay | Admitting: Physician Assistant

## 2019-12-18 DIAGNOSIS — L820 Inflamed seborrheic keratosis: Secondary | ICD-10-CM | POA: Diagnosis not present

## 2019-12-18 DIAGNOSIS — W57XXXA Bitten or stung by nonvenomous insect and other nonvenomous arthropods, initial encounter: Secondary | ICD-10-CM

## 2019-12-18 DIAGNOSIS — L570 Actinic keratosis: Secondary | ICD-10-CM

## 2019-12-18 DIAGNOSIS — S30860A Insect bite (nonvenomous) of lower back and pelvis, initial encounter: Secondary | ICD-10-CM | POA: Diagnosis not present

## 2019-12-18 DIAGNOSIS — D485 Neoplasm of uncertain behavior of skin: Secondary | ICD-10-CM

## 2019-12-18 MED ORDER — TRIAMCINOLONE ACETONIDE 0.1 % EX CREA: 1.0000 "application " | TOPICAL_CREAM | Freq: Two times a day (BID) | CUTANEOUS | 1 refills | Status: DC | PRN

## 2019-12-18 NOTE — Progress Notes (Signed)
   Follow-Up Visit   Subjective  Kimberly Suarez is a 79 y.o. female who presents for the following: Annual Exam (right side of nose- cant really see it today, Left temply -crusty). Nose has had NMSC previously removed. Crust was thick and fell off with some bleeding. Small crust there today. No symptoms. persistent   The following portions of the chart were reviewed this encounter and updated as appropriate: Tobacco  Allergies  Meds  Problems  Med Hx  Surg Hx  Fam Hx      Objective  Well appearing patient in no apparent distress; mood and affect are within normal limits.  A full examination was performed including scalp, head, eyes, ears, nose, lips, neck, chest, axillae, abdomen, back, buttocks, bilateral upper extremities, bilateral lower extremities, hands, feet, fingers, toes, fingernails, and toenails. All findings within normal limits unless otherwise noted below.  Objective  Glabella, Mid Forehead (2): Erythematous patches with gritty scale.  Objective  Left Upper Back (2), Mid Back, Right Upper Back: Whelps with excoriations  Objective  Left Temporal Scalp: Hyperkeratotic scale with pink base  Txpbx- curet 1.1cm     Objective  Right Nasal Sidewall: Hyperkeratotic scale with pink base  Txpbx-curet 0.4cm      Assessment & Plan  AK (actinic keratosis) (3) Mid Forehead (2); Glabella  Destruction of lesion - Glabella, Mid Forehead (2) Complexity: simple   Destruction method: cryotherapy   Informed consent: discussed and consent obtained   Timeout:  patient name, date of birth, surgical site, and procedure verified Lesion destroyed using liquid nitrogen: Yes   Cryotherapy cycles:  1 Outcome: patient tolerated procedure well with no complications   Post-procedure details: wound care instructions given    Insect bite of lower back with local reaction, initial encounter (4) Left Upper Back (2); Right Upper Back; Mid Back  TAC topical  Neoplasm of  uncertain behavior of skin (2) Left Temporal Scalp  Skin / nail biopsy Type of biopsy: tangential   Informed consent: discussed and consent obtained   Timeout: patient name, date of birth, surgical site, and procedure verified   Procedure prep:  Patient was prepped and draped in usual sterile fashion Prep type:  Chlorhexidine Anesthesia: the lesion was anesthetized in a standard fashion   Anesthetic:  1% lidocaine w/ epinephrine 1-100,000 local infiltration Instrument used: flexible razor blade   Hemostasis achieved with: aluminum chloride   Outcome: patient tolerated procedure well   Post-procedure details: wound care instructions given   Additional details:  PERFORMED ED&C AFTER BIOPSY   Specimen 1 - Surgical pathology Differential Diagnosis: scc Check Margins: No  Right Nasal Sidewall  Skin / nail biopsy Type of biopsy: tangential   Informed consent: discussed and consent obtained   Timeout: patient name, date of birth, surgical site, and procedure verified   Procedure prep:  Patient was prepped and draped in usual sterile fashion Prep type:  Chlorhexidine Anesthesia: the lesion was anesthetized in a standard fashion   Anesthetic:  1% lidocaine w/ epinephrine 1-100,000 local infiltration Instrument used: flexible razor blade   Hemostasis achieved with: aluminum chloride   Outcome: patient tolerated procedure well   Post-procedure details: wound care instructions given   Additional details:  PERFORMED ED&C AFTER BIOPSY   Specimen 2 - Surgical pathology Differential Diagnosis: scc Check Margins: No

## 2019-12-18 NOTE — Patient Instructions (Signed)

## 2019-12-26 ENCOUNTER — Telehealth: Payer: Self-pay | Admitting: *Deleted

## 2019-12-26 NOTE — Telephone Encounter (Signed)
Pathology results to patient.  °

## 2019-12-26 NOTE — Telephone Encounter (Signed)
-----   Message from Warren Danes, [place] sent at 12/25/2019  4:50 PM EDT ----- RTC PRN

## 2020-01-07 ENCOUNTER — Telehealth: Payer: Self-pay | Admitting: Family Medicine

## 2020-01-07 DIAGNOSIS — G629 Polyneuropathy, unspecified: Secondary | ICD-10-CM

## 2020-01-07 NOTE — Telephone Encounter (Signed)
  REFERRAL REQUEST Telephone Note 01/07/2020  What type of referral do you need? Numbness and tingling and burning in her feet and hands she know she has neuropathy  Have you been seen at our office for this problem? Yes she had a referral in the past but cancelled the appt  (Advise that they may need an appointment with their PCP before a referral can be done)  Is there a particular doctor or location that you prefer?[facility]  Patient notified that referrals can take up to a week or longer to process. If they haven't heard anything within a week they should call back and speak with the referral department.

## 2020-01-12 ENCOUNTER — Other Ambulatory Visit (HOSPITAL_COMMUNITY): Payer: Self-pay | Admitting: Nephrology

## 2020-01-12 ENCOUNTER — Other Ambulatory Visit: Payer: Self-pay | Admitting: Nephrology

## 2020-01-12 DIAGNOSIS — M1990 Unspecified osteoarthritis, unspecified site: Secondary | ICD-10-CM | POA: Diagnosis not present

## 2020-01-12 DIAGNOSIS — I129 Hypertensive chronic kidney disease with stage 1 through stage 4 chronic kidney disease, or unspecified chronic kidney disease: Secondary | ICD-10-CM | POA: Diagnosis not present

## 2020-01-12 DIAGNOSIS — N1832 Chronic kidney disease, stage 3b: Secondary | ICD-10-CM

## 2020-01-12 DIAGNOSIS — R7303 Prediabetes: Secondary | ICD-10-CM | POA: Diagnosis not present

## 2020-01-12 DIAGNOSIS — N390 Urinary tract infection, site not specified: Secondary | ICD-10-CM | POA: Diagnosis not present

## 2020-01-12 DIAGNOSIS — E785 Hyperlipidemia, unspecified: Secondary | ICD-10-CM | POA: Diagnosis not present

## 2020-01-15 ENCOUNTER — Other Ambulatory Visit: Payer: Self-pay | Admitting: Family

## 2020-01-15 ENCOUNTER — Telehealth: Payer: Self-pay | Admitting: Family Medicine

## 2020-01-15 NOTE — Progress Notes (Signed)
Please make patient an appt to be seen about neuropathy. Neurologists needs this before seeing them.

## 2020-01-15 NOTE — Telephone Encounter (Signed)
Pt scheduled with Dr Darnell Level 02/13/20 at 10:30 for neuropathy.

## 2020-01-15 NOTE — Progress Notes (Signed)
lmtcb

## 2020-01-15 NOTE — Progress Notes (Signed)
Pt is scheduled with Dr Darnell Level 02/13/20 at 10:30.

## 2020-01-20 ENCOUNTER — Other Ambulatory Visit: Payer: Self-pay

## 2020-01-20 ENCOUNTER — Ambulatory Visit (HOSPITAL_COMMUNITY)
Admission: RE | Admit: 2020-01-20 | Discharge: 2020-01-20 | Disposition: A | Discharge status: Home / Self Care | Payer: Medicare Other | Source: Ambulatory Visit | Attending: Nephrology | Admitting: Nephrology

## 2020-01-20 DIAGNOSIS — N1832 Chronic kidney disease, stage 3b: Secondary | ICD-10-CM | POA: Diagnosis present

## 2020-01-20 DIAGNOSIS — N189 Chronic kidney disease, unspecified: Secondary | ICD-10-CM | POA: Diagnosis not present

## 2020-01-21 ENCOUNTER — Ambulatory Visit: Payer: Medicare Other | Admitting: Licensed Clinical Social Worker

## 2020-01-21 DIAGNOSIS — K219 Gastro-esophageal reflux disease without esophagitis: Secondary | ICD-10-CM

## 2020-01-21 DIAGNOSIS — I10 Essential (primary) hypertension: Secondary | ICD-10-CM

## 2020-01-21 DIAGNOSIS — E7800 Pure hypercholesterolemia, unspecified: Secondary | ICD-10-CM

## 2020-01-21 NOTE — Patient Instructions (Signed)
Licensed Clinical Social Worker Visit Information  Goals we discussed today:  Goals Addressed            This Visit's Progress   . Client will talk with LCSW in next 30 days about mobility issues and balance issues of client (pt-stated)       CARE PLAN ENTRY  Current Barriers:  . Osteoarthritis in patient with chronic diagnoses of Dysphagia GERD, HLD, HTN . Mobility issues . Neuropathy issues (burning feelings in feet, numbing feelings in feet and hands)   Clinical Social Work Clinical Goal(s):  Marland Kitchen LCSW will call client in next 30 days to talk with client about mobility issues of client and about balance issues of client  Interventions: . Talked with client about CCM program support . Talked with client about RNCM support for nursing needs of client . Talked with client about client completion of ADLs . Talked with client about meal provision . Talked with client about her social support (spouse is supportive, son is supportive) . Talked with client about her upcoming client appointment with Dr. Lajuana Ripple, February 13, 2020) . Talked with client about pain issues of client . Talked with client about sleeping challenges of client . Talked with client about balance issues of client . Talked with client about relaxation techniques (watch TV, take a nap, talk with son and daughter  via phone) . Talked with client about vision issues of client  Patient Self Care Activities:  Completes ADLs independently Drives car to needed appointments and to complete errands  Patient Self Care Deficits:  Marland Kitchen Mobility issues . Pain issues  Initial goal documentation       Materials Provided: No  Follow Up Plan: LCSW to call client in next 4 weeks to talk with her about social work needs of client at that time  The patient verbalized understanding of instructions provided today and declined a print copy of patient instruction materials.   Kimberly Suarez.[place] MSW, LCSW Licensed Clinical Social  Worker [name] [phone]

## 2020-01-21 NOTE — Chronic Care Management (AMB) (Signed)
Chronic Care Management    Clinical Social Work Follow Up Note  01/21/2020 Name: KANAI HEALY MRN: [id number] DOB: 1940/10/10  TANETTE ARMADA is a 79 y.o. year old female who is a primary care patient of [name], DO. The CCM team was consulted for assistance with [employer] .   Review of patient status, including review of consultants reports, other relevant assessments, and collaboration with appropriate care team members and the patient's provider was performed as part of comprehensive patient evaluation and provision of chronic care management services.    SDOH (Social Determinants of Health) assessments performed: Yes; risk for tobacco use; risk for depression  SDOH Interventions     Most Recent Value  SDOH Interventions  Depression Interventions/Treatment   -- [talked with client about LCSW support and RNCM support]        Chronic Care Management from 01/21/2020 in [facility]  PHQ-9 Total Score  5     GAD 7 : Generalized Anxiety Score 01/21/2020  Nervous, Anxious, on Edge 1  Control/stop worrying 1  Worry too much - different things 0  Trouble relaxing 0  Restless 0  Easily annoyed or irritable 0  Afraid - awful might happen 0  Total GAD 7 Score 2  Anxiety Difficulty Somewhat difficult    Outpatient Encounter Medications as of 01/21/2020  Medication Sig  . aspirin 81 MG tablet Take 81 mg by mouth daily.    Marland Kitchen atorvastatin (LIPITOR) 40 MG tablet Take 1 tablet (40 mg total) by mouth daily.  Marland Kitchen b complex vitamins capsule Take 1 capsule by mouth daily.  . enalapril (VASOTEC) 10 MG tablet Take 1 tablet (10 mg total) by mouth 2 (two) times daily.  . fluticasone (FLONASE) 50 MCG/ACT nasal spray Place 1 spray into both nostrils 2 (two) times daily as needed for allergies or rhinitis.  Marland Kitchen gabapentin (NEURONTIN) 100 MG capsule Take 200 mg by mouth at bedtime.  . Glucosamine-Chondroitin (GLUCOSAMINE CHONDR COMPLEX PO) Take 1 tablet by  mouth 2 (two) times daily.   . hydrochlorothiazide (HYDRODIURIL) 25 MG tablet Take 1 tablet (25 mg total) by mouth daily.  . Multiple Vitamins-Minerals (MULTIVITAMIN ADULT PO) Take 1 tablet by mouth daily.  . Omega-3 Fatty Acids (FISH OIL) 1000 MG CAPS Take 1 capsule by mouth daily.   . pantoprazole (PROTONIX) 40 MG tablet Take 1 tablet (40 mg total) by mouth daily.  Marland Kitchen triamcinolone cream (KENALOG) 0.1 % Apply 1 application topically 2 (two) times daily as needed. Not for face,folds or groin  . VITAMIN D PO Take by mouth.   No facility-administered encounter medications on file as of 01/21/2020.    Goals Addressed            This Visit's Progress   . Client will talk with LCSW in next 30 days about mobility issues and balance issues of client (pt-stated)       [place] (see longitudinal plan of care for additional care plan information)  Current Barriers:  . Osteoarthritis in patient with chronic diagnoses of Dysphagia GERD, HLD, HTN . Mobility issues . Neuropathy issues (burning feelings in feet, numbing feelings in feet and hands)   Clinical Social Work Clinical Goal(s):  Marland Kitchen LCSW will call client in next 30 days to talk with client about mobility issues of client and about balance issues of client  Interventions: . Talked with client about CCM program support . Talked with client about RNCM support for nursing needs of  client . Talked with client about client completion of ADLs . Talked with client about meal provision . Talked with client about her social support (spouse is supportive, son is supportive) . Talked with client about her upcoming client appointment with Dr. Lajuana Ripple, February 13, 2020) . Talked with client about pain issues of client . Talked with client about sleeping challenges of client . Talked with client about balance issues of client . Talked with client about relaxation techniques (watch TV, take a nap, talk with son and daughter  via  phone) . Talked with client about vision issues of client  Patient Self Care Activities:  Completes ADLs independently Drives car to needed appointments and to complete errands  Patient Self Care Deficits:  Marland Kitchen Mobility issues . Pain issues  Initial goal documentation        Follow Up Plan: LCSW to call client in next 4 weeks to talk with her about mobility issues of client and to talk with her about balance issues of client  Norva Riffle.Arturo Sofranko MSW, LCSW Licensed Clinical Social Worker [facility] Care Management [phone]

## 2020-02-04 ENCOUNTER — Ambulatory Visit (INDEPENDENT_AMBULATORY_CARE_PROVIDER_SITE_OTHER): Payer: Medicare Other

## 2020-02-04 ENCOUNTER — Encounter: Payer: Self-pay | Admitting: Physician Assistant

## 2020-02-04 ENCOUNTER — Other Ambulatory Visit: Payer: Self-pay

## 2020-02-04 ENCOUNTER — Ambulatory Visit (INDEPENDENT_AMBULATORY_CARE_PROVIDER_SITE_OTHER): Payer: Medicare Other | Admitting: Physician Assistant

## 2020-02-04 VITALS — BP 132/79 | HR 84 | Temp 97.5°F | Resp 20 | Ht 63.0 in | Wt 154.0 lb

## 2020-02-04 DIAGNOSIS — M25521 Pain in right elbow: Secondary | ICD-10-CM | POA: Diagnosis not present

## 2020-02-04 NOTE — Progress Notes (Signed)
  Subjective:     Patient ID: Kimberly Suarez, female   DOB: [birth year], 79 y.o.   MRN: [id number]  HPI Pt with a fall last week when leaving [place] office States her feet got tangled up and she fell forward on outstretched hands and then rolled to the right side Pain and bruising to the R lateral elbow Pain with certain movements No pain/numbness distal  Review of Systems  Constitutional: Negative.   Musculoskeletal: Positive for arthralgias. Negative for back pain, joint swelling and myalgias.       Objective:   Physical Exam Vitals and nursing note reviewed.   Resolving ecchy tot he lateral elbow are with sl edema No erythema or induration noted FROM of the elbow. Sl TTP over the lateral epicondyle area No TTP of the forearm, wrist, hand FROM of the wrist Grip strength equal bilat Sensory intact Xray- see labs     Assessment:     1. Right elbow pain        Plan:     Heat/Ice Nl course reviewed OTC meds prn F/U prn

## 2020-02-04 NOTE — Patient Instructions (Signed)
Contusion A contusion is a deep bruise. This is a result of an injury that causes bleeding under the skin. Symptoms of bruising include pain, swelling, and discolored skin. The skin may turn blue, purple, or yellow. Follow these instructions at home: Managing pain, stiffness, and swelling You may use RICE. This stands for:  Resting.  Icing.  Compression, or putting pressure.  Elevating, or raising the injured area. To follow this method, do these actions:  Rest the injured area.  If told, put ice on the injured area. ? Put ice in a plastic bag. ? Place a towel between your skin and the bag. ? Leave the ice on for 20 minutes, 2-3 times per day.  If told, put light pressure (compression) on the injured area using an elastic bandage. Make sure the bandage is not too tight. If the area tingles or becomes numb, remove it and put it back on as told by your doctor.  If possible, raise (elevate) the injured area above the level of your heart while you are sitting or lying down.  General instructions  Take over-the-counter and prescription medicines only as told by your doctor.  Keep all follow-up visits as told by your doctor. This is important. Contact a doctor if:  Your symptoms do not get better after several days of treatment.  Your symptoms get worse.  You have trouble moving the injured area. Get help right away if:  You have very bad pain.  You have a loss of feeling (numbness) in a hand or foot.  Your hand or foot turns pale or cold. Summary  A contusion is a deep bruise. This is a result of an injury that causes bleeding under the skin.  Symptoms of bruising include pain, swelling, and discolored skin. The skin may turn blue, purple, or yellow.  This condition is treated with rest, ice, compression, and elevation. This is also called RICE. You may be given over-the-counter medicines for pain.  Contact a doctor if you do not feel better, or you feel worse. Get  help right away if you have very bad pain, have lost feeling in a hand or foot, or the area turns pale or cold. This information is not intended to replace advice given to you by your health care provider. Make sure you discuss any questions you have with your health care provider. Document Revised: 04/05/2018 Document Reviewed: 04/05/2018 Elsevier Patient Education  2020 Elsevier Inc.  

## 2020-02-05 ENCOUNTER — Telehealth: Payer: Self-pay | Admitting: *Deleted

## 2020-02-05 DIAGNOSIS — S52101A Unspecified fracture of upper end of right radius, initial encounter for closed fracture: Secondary | ICD-10-CM

## 2020-02-05 NOTE — Telephone Encounter (Signed)
Patient's x-ray was read by the radiologist and showed that she has a right elbow fracture, will do referral to orthopedic stat, please if she has a sling wear it, if not she can come get 1 if we cannot get an orthopedic today or tomorrow.

## 2020-02-05 NOTE — Telephone Encounter (Signed)
Call report on patient's right elbow.  She has a fracture.  Please review and advise

## 2020-02-05 NOTE — Telephone Encounter (Signed)
Aware of fracture. Staff working on quick referral.

## 2020-02-05 NOTE — Addendum Note (Signed)
Addended by: Caryl Pina on: 02/05/2020 01:04 PM   Modules accepted: Orders

## 2020-02-06 DIAGNOSIS — S52134A Nondisplaced fracture of neck of right radius, initial encounter for closed fracture: Secondary | ICD-10-CM | POA: Diagnosis not present

## 2020-02-13 ENCOUNTER — Ambulatory Visit (INDEPENDENT_AMBULATORY_CARE_PROVIDER_SITE_OTHER): Payer: Medicare Other | Admitting: Family Medicine

## 2020-02-13 ENCOUNTER — Other Ambulatory Visit: Payer: Self-pay

## 2020-02-13 ENCOUNTER — Encounter: Payer: Self-pay | Admitting: Family Medicine

## 2020-02-13 VITALS — BP 122/71 | HR 72 | Temp 97.6°F | Ht 63.0 in | Wt 153.0 lb

## 2020-02-13 DIAGNOSIS — G629 Polyneuropathy, unspecified: Secondary | ICD-10-CM | POA: Diagnosis not present

## 2020-02-13 DIAGNOSIS — I10 Essential (primary) hypertension: Secondary | ICD-10-CM | POA: Diagnosis not present

## 2020-02-13 DIAGNOSIS — S42401D Unspecified fracture of lower end of right humerus, subsequent encounter for fracture with routine healing: Secondary | ICD-10-CM | POA: Diagnosis not present

## 2020-02-13 DIAGNOSIS — N1832 Chronic kidney disease, stage 3b: Secondary | ICD-10-CM | POA: Diagnosis not present

## 2020-02-13 NOTE — Progress Notes (Signed)
Subjective: CC: neuropathy in feet PCP: Kimberly Norlander, DO Kimberly Suarez is a 79 y.o. female presenting to clinic today for:  1.  Peripheral neuropathy Patient wants to see the neurologist for nerve conduction studies.  She has had some neuropathy which she describes as burning, numbness and tingling in the right hand and right foot.  She has milder symptoms in the left.  No history of CVA.  She is not a diabetic.  No known family history of idiopathic neuropathy but she does have a family history of multiple sclerosis in her sister.  She does feel sometimes that she has weakness in the right hand and foot.  2. Hypertension with hyperlipidemia; CKD3 Patient reports compliance with her enalapril 10 mg daily, hydrochlorothiazide 25 mg daily and Lipitor 40 mg daily.  She established with Dr. Marval Regal and will be seeing him again in 4 months.  She was "she had "thinning of the kidneys".  3.  Elbow fracture Patient had a right elbow fracture earlier this month.  She was seen at the orthopedist office.  No surgical intervention was needed.  She is having full active range of motion and not much pain in the right elbow.  She was told to follow-up in 2 weeks but she would prefer not to drive down there and rather get the x-ray done here.  ROS: Per HPI  No Known Allergies Past Medical History:  Diagnosis Date  . ASCUS (atypical squamous cells of undetermined significance) on Pap smear 2011x2   Negative high-risk HPV, normal Pap smears 2012/2013  . Atypical nevus 05/02/2007   right back-moderate   . Cancer ([place])   . Cataract   . Diverticulosis   . GERD (gastroesophageal reflux disease)   . Hiatal hernia   . Hyperlipemia   . Hypertension   . IBS (irritable bowel syndrome)   . Lymphocytic colitis 2007  . SCC (squamous cell carcinoma) 01/19/2006   bridge of nose (CX35FU)  . SCC (squamous cell carcinoma) 03/08/2017   right inner lower shin (CX35FU)  . SCC (squamous cell  carcinoma) x 2 02/24/2002   right bridge of nose (Cx35FU),right bridge of nose  . Uterine prolapse    Pessary    Current Outpatient Medications:  .  aspirin 81 MG tablet, Take 81 mg by mouth daily.  , Disp: , Rfl:  .  atorvastatin (LIPITOR) 40 MG tablet, Take 1 tablet (40 mg total) by mouth daily., Disp: 90 tablet, Rfl: 1 .  b complex vitamins capsule, Take 1 capsule by mouth daily., Disp: , Rfl:  .  enalapril (VASOTEC) 10 MG tablet, Take 1 tablet (10 mg total) by mouth 2 (two) times daily., Disp: 180 tablet, Rfl: 1 .  fluticasone (FLONASE) 50 MCG/ACT nasal spray, Place 1 spray into both nostrils 2 (two) times daily as needed for allergies or rhinitis., Disp: 16 g, Rfl: 6 .  gabapentin (NEURONTIN) 100 MG capsule, Take 200 mg by mouth at bedtime., Disp: , Rfl:  .  Glucosamine-Chondroitin (GLUCOSAMINE CHONDR COMPLEX PO), Take 1 tablet by mouth 2 (two) times daily. , Disp: , Rfl:  .  hydrochlorothiazide (HYDRODIURIL) 25 MG tablet, Take 1 tablet (25 mg total) by mouth daily., Disp: 90 tablet, Rfl: 1 .  Multiple Vitamins-Minerals (MULTIVITAMIN ADULT PO), Take 1 tablet by mouth daily., Disp: , Rfl:  .  Omega-3 Fatty Acids (FISH OIL) 1000 MG CAPS, Take 1 capsule by mouth daily. , Disp: , Rfl:  .  pantoprazole (PROTONIX) 40 MG tablet, Take  1 tablet (40 mg total) by mouth daily., Disp: 90 tablet, Rfl: 3 .  triamcinolone cream (KENALOG) 0.1 %, Apply 1 application topically 2 (two) times daily as needed. Not for face,folds or groin, Disp: 454 g, Rfl: 1 .  VITAMIN D PO, Take by mouth., Disp: , Rfl:  Social History   Socioeconomic History  . Marital status: Married    Spouse name: Not on file  . Number of children: 3   . Years of education: Not on file  . Highest education level: 8th grade  Occupational History  . Occupation: Retired   Tobacco Use  . Smoking status: Never Smoker  . Smokeless tobacco: Never Used  Vaping Use  . Vaping Use: Never used  Substance and Sexual Activity  . Alcohol  use: Yes    Alcohol/week: 0.0 standard drinks    Comment: Rare  . Drug use: No  . Sexual activity: Yes    Birth control/protection: Post-menopausal    Comment: 1st intercourse 27 yo--1 partner  Other Topics Concern  . Not on file  Social History Narrative   Occ caffeine drinks     Social Determinants of Health   Financial Resource Strain:   . Difficulty of Paying Living Expenses:   Food Insecurity:   . Worried About Charity fundraiser in the Last Year:   . Arboriculturist in the Last Year:   Transportation Needs:   . Film/video editor (Medical):   Marland Kitchen Lack of Transportation (Non-Medical):   Physical Activity:   . Days of Exercise per Week:   . Minutes of Exercise per Session:   Stress:   . Feeling of Stress :   Social Connections:   . Frequency of Communication with Friends and Family:   . Frequency of Social Gatherings with Friends and Family:   . Attends Religious Services:   . Active Member of Clubs or Organizations:   . Attends Archivist Meetings:   Marland Kitchen Marital Status:   Intimate Partner Violence:   . Fear of Current or Ex-Partner:   . Emotionally Abused:   Marland Kitchen Physically Abused:   . Sexually Abused:    Family History  Problem Relation Age of Onset  . Heart disease Mother   . Diabetes Brother        BKA  . COPD Father   . Asthma Father   . Alzheimer's disease Sister   . Alzheimer's disease Brother   . Multiple sclerosis Sister   . Heart disease Sister   . Diabetes Brother   . Early death Brother        died in war  . Colon cancer Neg Hx   . Kidney disease Neg Hx   . Liver disease Neg Hx     Objective: Office vital signs reviewed. BP 122/71   Pulse 72   Temp 97.6 F (36.4 C) (Temporal)   Ht 5\' 3"  (1.6 m)   Wt 153 lb (69.4 kg)   SpO2 98%   BMI 27.10 kg/m   Physical Examination:  General: Awake, alert, well nourished, No acute distress HEENT: Normal, sclera white, MMM  Cardio: regular rate and rhythm, S1S2 heard, no murmurs  appreciated Pulm: clear to auscultation bilaterally, no wheezes, rhonchi or rales; normal work of breathing on room air Extremities: warm, well perfused, No edema, cyanosis or clubbing; +2 pulses bilaterally Neuro: Absent vibratory sensation in the right foot and ankle.  She has mild vibratory sensation in the left foot.  Vibratory sensation  in the right and left hands is present.  Light touch sensation intact throughout. MSK: She is moving her right upper extremity without difficulty.  Assessment/ Plan: 79 y.o. female   1. Chronic kidney disease, stage 3b Continue to follow with Dr. Josephine Igo as scheduled - Renal Function Panel  2. Essential hypertension Controlled - Renal Function Panel  3. Peripheral polyneuropathy She has referral placed.  I will have these notes sent to them today  4. Closed fracture of right elbow with routine healing, subsequent encounter Future order for a follow-up x-ray of the elbow placed - DG Elbow Complete Right; Future    No orders of the defined types were placed in this encounter.  No orders of the defined types were placed in this encounter.    Kimberly Norlander, DO [place] [phone]

## 2020-02-25 ENCOUNTER — Ambulatory Visit: Payer: Self-pay | Admitting: Licensed Clinical Social Worker

## 2020-02-25 DIAGNOSIS — K219 Gastro-esophageal reflux disease without esophagitis: Secondary | ICD-10-CM

## 2020-02-25 DIAGNOSIS — I10 Essential (primary) hypertension: Secondary | ICD-10-CM

## 2020-02-25 DIAGNOSIS — N1832 Chronic kidney disease, stage 3b: Secondary | ICD-10-CM

## 2020-02-25 NOTE — Chronic Care Management (AMB) (Signed)
  Chronic Care Management    Clinical Social Work Follow Up Note  02/25/2020 Name: Kimberly Suarez MRN: [id number] DOB: 1941-06-21  Kimberly Suarez is a 79 y.o. year old female who is a primary care patient of [name], DO. The CCM team was consulted for assistance with [employer] .   Review of patient status, including review of consultants reports, other relevant assessments, and collaboration with appropriate care team members and the patient's provider was performed as part of comprehensive patient evaluation and provision of chronic care management services.    SDOH (Social Determinants of Health) assessments performed: No;risk for tobacco use; risk for stress; risk for depression    Outpatient Encounter Medications as of 02/25/2020  Medication Sig  . aspirin 81 MG tablet Take 81 mg by mouth daily.    Marland Kitchen atorvastatin (LIPITOR) 40 MG tablet Take 1 tablet (40 mg total) by mouth daily.  Marland Kitchen b complex vitamins capsule Take 1 capsule by mouth daily.  . enalapril (VASOTEC) 10 MG tablet Take 1 tablet (10 mg total) by mouth 2 (two) times daily.  . fluticasone (FLONASE) 50 MCG/ACT nasal spray Place 1 spray into both nostrils 2 (two) times daily as needed for allergies or rhinitis.  Marland Kitchen gabapentin (NEURONTIN) 100 MG capsule Take 200 mg by mouth at bedtime.  . Glucosamine-Chondroitin (GLUCOSAMINE CHONDR COMPLEX PO) Take 1 tablet by mouth 2 (two) times daily.   . hydrochlorothiazide (HYDRODIURIL) 25 MG tablet Take 1 tablet (25 mg total) by mouth daily.  . Multiple Vitamins-Minerals (MULTIVITAMIN ADULT PO) Take 1 tablet by mouth daily.  . Omega-3 Fatty Acids (FISH OIL) 1000 MG CAPS Take 1 capsule by mouth daily.   . pantoprazole (PROTONIX) 40 MG tablet Take 1 tablet (40 mg total) by mouth daily.  Marland Kitchen triamcinolone cream (KENALOG) 0.1 % Apply 1 application topically 2 (two) times daily as needed. Not for face,folds or groin  . VITAMIN D PO Take by mouth.   No facility-administered  encounter medications on file as of 02/25/2020.    Upon chart review, LCSW documented on 01/21/2020 that on that date, client had requested to be discharged at that time from [facility] program services. Thus, client is discharged from CCM program services at her request back on 01/21/2020.   Norva Riffle.Coila Wardell MSW, LCSW Licensed Clinical Social Worker [facility] Care Management [phone]

## 2020-02-25 NOTE — Patient Instructions (Addendum)
Licensed Clinical Social Worker Visit Information  Materials Provided: No  02/25/2020  Name: Kimberly Suarez        MRN: [id number]       DOB: [birth year]  Kimberly Suarez is a 79 y.o. year old female who is a primary care patient of [name], DO. The CCM team was consulted for assistance with [employer] .   Review of patient status, including review of consultants reports, other relevant assessments, and collaboration with appropriate care team members and the patient's provider was performed as part of comprehensive patient evaluation and provision of chronic care management services.    SDOH (Social Determinants of Health) assessments performed: No;risk for tobacco use; risk for stress; risk for depression    Upon chart review, LCSW documented on 01/21/2020 that on that date, [name]. Thus, client is discharged from CCM program services at her request back on 01/21/2020.   LCSW was not able to speak via phone with client today; thus, the client was not able to verbalize understanding of instructions provided today and was not able to accept or decline a print copy of patient instruction materials.   Norva Riffle.Judithe Keetch MSW, LCSW Licensed Clinical Social Worker [facility] Care Management [phone]

## 2020-03-11 ENCOUNTER — Encounter: Payer: Self-pay | Admitting: Neurology

## 2020-03-11 ENCOUNTER — Ambulatory Visit: Payer: Medicare Other | Admitting: Neurology

## 2020-03-11 ENCOUNTER — Telehealth: Payer: Self-pay | Admitting: Neurology

## 2020-03-11 VITALS — BP 135/77 | HR 80 | Ht 63.0 in | Wt 152.5 lb

## 2020-03-11 DIAGNOSIS — R159 Full incontinence of feces: Secondary | ICD-10-CM | POA: Diagnosis not present

## 2020-03-11 DIAGNOSIS — M5441 Lumbago with sciatica, right side: Secondary | ICD-10-CM | POA: Diagnosis not present

## 2020-03-11 DIAGNOSIS — R202 Paresthesia of skin: Secondary | ICD-10-CM

## 2020-03-11 DIAGNOSIS — G8929 Other chronic pain: Secondary | ICD-10-CM | POA: Diagnosis not present

## 2020-03-11 NOTE — Progress Notes (Signed)
HISTORICAL  Kimberly Suarez is a 79 year old female, seen in request by her primary care doctor [name], for evaluation of bilateral hand and feet paresthesia, initial evaluation was on March 11, 2020.  I reviewed and summarized the referring note.  Past medical history HTN HLD   She began to experience right hand paresthesia since 2017, she described fingers numbness tingling, burning sensation, mostly involving right first and second fingers, especially at nighttime, sometimes wake her up from sleep, she has been wearing Copper infused gloves, which has been helpful, her left hand bother her occasionally, but is much less severe  She also has occasionally right foot numbness tingling recently, denies significant pain, she has no gait abnormality.  She denies significant neck pain, does complains of low back pain, radiating pain to bilateral lower extremity, since 2020, she also noticed worsening bowel incontinence, sometimes 1 she got up from prolonged sitting position, she has mild bowel incontinence Her older sister passed away from multiple sclerosis in her 20s,  Laboratory evaluation in February 2021, showed normal TSH, A1c 6.5, BMP showed elevated creatinine 1.73, with GFR of 28, calcium was elevated 10.5, REVIEW OF SYSTEMS: Full 14 system review of systems performed and notable only for as above All other review of systems were negative.  ALLERGIES: No Known Allergies  HOME MEDICATIONS: Current Outpatient Medications  Medication Sig Dispense Refill  . aspirin 81 MG tablet Take 81 mg by mouth daily.      Marland Kitchen atorvastatin (LIPITOR) 40 MG tablet Take 1 tablet (40 mg total) by mouth daily. 90 tablet 1  . b complex vitamins capsule Take 1 capsule by mouth daily.    . enalapril (VASOTEC) 10 MG tablet Take 1 tablet (10 mg total) by mouth 2 (two) times daily. 180 tablet 1  . fluticasone (FLONASE) 50 MCG/ACT nasal spray Place 1 spray into both nostrils 2 (two) times daily as  needed for allergies or rhinitis. 16 g 6  . gabapentin (NEURONTIN) 100 MG capsule Take 200 mg by mouth at bedtime.    . Glucosamine-Chondroitin (GLUCOSAMINE CHONDR COMPLEX PO) Take 1 tablet by mouth 2 (two) times daily.     . hydrochlorothiazide (HYDRODIURIL) 25 MG tablet Take 1 tablet (25 mg total) by mouth daily. 90 tablet 1  . Multiple Vitamins-Minerals (MULTIVITAMIN ADULT PO) Take 1 tablet by mouth daily.    . Omega-3 Fatty Acids (FISH OIL) 1000 MG CAPS Take 1 capsule by mouth daily.     Marland Kitchen VITAMIN D PO Take by mouth.     No current facility-administered medications for this visit.    PAST MEDICAL HISTORY: Past Medical History:  Diagnosis Date  . ASCUS (atypical squamous cells of undetermined significance) on Pap smear 2011x2   Negative high-risk HPV, normal Pap smears 2012/2013  . Atypical nevus 05/02/2007   right back-moderate   . Cancer ([place])   . Cataract   . Diverticulosis   . GERD (gastroesophageal reflux disease)   . Hiatal hernia   . Hyperlipemia   . Hypertension   . IBS (irritable bowel syndrome)   . Lymphocytic colitis 2007  . Neuropathy   . SCC (squamous cell carcinoma) 01/19/2006   bridge of nose (CX35FU)  . SCC (squamous cell carcinoma) 03/08/2017   right inner lower shin (CX35FU)  . SCC (squamous cell carcinoma) x 2 02/24/2002   right bridge of nose (Cx35FU),right bridge of nose  . Uterine prolapse    Pessary    PAST SURGICAL HISTORY: Past Surgical History:  Procedure Laterality Date  . BREAST BIOPSY     unsure which breast  . CATARACT EXTRACTION W/ INTRAOCULAR LENS IMPLANT Bilateral     FAMILY HISTORY: Family History  Problem Relation Age of Onset  . Heart disease Mother   . Diabetes Brother        BKA  . COPD Father   . Asthma Father   . Alzheimer's disease Sister   . Alzheimer's disease Brother   . Multiple sclerosis Sister   . Heart disease Sister   . Diabetes Brother   . Early death Brother        died in war  . Colon cancer Neg Hx    . Kidney disease Neg Hx   . Liver disease Neg Hx     SOCIAL HISTORY: Social History   Socioeconomic History  . Marital status: Married    Spouse name: Not on file  . Number of children: 3   . Years of education: Not on file  . Highest education level: 8th grade  Occupational History  . Occupation: Retired   Tobacco Use  . Smoking status: Never Smoker  . Smokeless tobacco: Never Used  Vaping Use  . Vaping Use: Never used  Substance and Sexual Activity  . Alcohol use: Yes    Alcohol/week: 0.0 standard drinks    Comment: Rare  . Drug use: No  . Sexual activity: Yes    Birth control/protection: Post-menopausal    Comment: 1st intercourse 29 yo--1 partner  Other Topics Concern  . Not on file  Social History Narrative   Lives at home with husband.   Right-handed.   Caffeine use: one cup caffeine some days.   Social Determinants of Health   Financial Resource Strain:   . Difficulty of Paying Living Expenses:   Food Insecurity:   . Worried About Charity fundraiser in the Last Year:   . Arboriculturist in the Last Year:   Transportation Needs:   . Film/video editor (Medical):   Marland Kitchen Lack of Transportation (Non-Medical):   Physical Activity:   . Days of Exercise per Week:   . Minutes of Exercise per Session:   Stress:   . Feeling of Stress :   Social Connections:   . Frequency of Communication with Friends and Family:   . Frequency of Social Gatherings with Friends and Family:   . Attends Religious Services:   . Active Member of Clubs or Organizations:   . Attends Archivist Meetings:   Marland Kitchen Marital Status:   Intimate Partner Violence:   . Fear of Current or Ex-Partner:   . Emotionally Abused:   Marland Kitchen Physically Abused:   . Sexually Abused:      PHYSICAL EXAM   Vitals:   03/11/20 1343  BP: 135/77  Pulse: 80  Weight: 152 lb 8 oz (69.2 kg)  Height: 5\' 3"  (1.6 m)   Not recorded     Body mass index is 27.01 kg/m.  PHYSICAL  EXAMNIATION:  Gen: NAD, conversant, well nourised, well groomed                     Cardiovascular: Regular rate rhythm, no peripheral edema, warm, nontender. Eyes: Conjunctivae clear without exudates or hemorrhage Neck: Supple, no carotid bruits. Pulmonary: Clear to auscultation bilaterally   NEUROLOGICAL EXAM:  MENTAL STATUS: Speech:    Speech is normal; fluent and spontaneous with normal comprehension.  Cognition:     Orientation to time, place  and person     Normal recent and remote memory     Normal Attention span and concentration     Normal Language, naming, repeating,spontaneous speech     Fund of knowledge   CRANIAL NERVES: CN II: Visual fields are full to confrontation. Pupils are round equal and briskly reactive to light. CN III, IV, VI: extraocular movement are normal. No ptosis. CN V: Facial sensation is intact to light touch CN VII: Face is symmetric with normal eye closure  CN VIII: Hearing is normal to causal conversation. CN IX, X: Phonation is normal. CN XI: Head turning and shoulder shrug are intact  MOTOR: There is no pronator drift of out-stretched arms. Muscle bulk and tone are normal. Muscle strength is normal.  REFLEXES: Reflexes are trace and symmetric at the biceps, triceps, knees, and absent at ankles. Plantar responses are flexor.  SENSORY: Length dependent decreased to light touch, pinprick and vibratory sensation to ankle level  COORDINATION: There is no trunk or limb dysmetria noted.  GAIT/STANCE: She needs push-up to get up from seated position, wide-based, cautious, mildly unsteady   DIAGNOSTIC DATA (LABS, IMAGING, TESTING) - I reviewed patient records, labs, notes, testing and imaging myself where available.   ASSESSMENT AND PLAN  LONITA DEBES is a 80 y.o. female   Chronic low back pain, worsening radiating pain to right lower extremity, occasionally bowel incontinence Bilateral upper lower extremity  paresthesia  Differentiation diagnosis include lumbosacral radiculopathy, versus peripheral neuropathy  MRI of lumbar spine  EMG nerve conduction study   Marcial Pacas, M.D. Ph.D.  [facility] [address] [place], [place] [address] Ph: [phone] Fax: [phone]  CC:  [name], DO [address] [place],  [place] [address]

## 2020-03-11 NOTE — Telephone Encounter (Signed)
[employer] medicare order sent to GI. No auth they will reach out to the patient to schedule.  

## 2020-03-23 ENCOUNTER — Other Ambulatory Visit: Payer: Self-pay

## 2020-03-23 ENCOUNTER — Ambulatory Visit
Admission: RE | Admit: 2020-03-23 | Discharge: 2020-03-23 | Disposition: A | Discharge status: Home / Self Care | Payer: Medicare Other | Source: Ambulatory Visit | Attending: Neurology | Admitting: Neurology

## 2020-03-23 DIAGNOSIS — G8929 Other chronic pain: Secondary | ICD-10-CM

## 2020-03-23 DIAGNOSIS — R159 Full incontinence of feces: Secondary | ICD-10-CM

## 2020-03-23 DIAGNOSIS — M5416 Radiculopathy, lumbar region: Secondary | ICD-10-CM | POA: Diagnosis not present

## 2020-03-23 DIAGNOSIS — R202 Paresthesia of skin: Secondary | ICD-10-CM | POA: Diagnosis not present

## 2020-03-23 DIAGNOSIS — M5441 Lumbago with sciatica, right side: Secondary | ICD-10-CM | POA: Diagnosis not present

## 2020-03-29 ENCOUNTER — Telehealth: Payer: Self-pay | Admitting: Neurology

## 2020-03-29 NOTE — Telephone Encounter (Signed)
I have spoken to the patient and provided her with the results below. She did not have any questions on the phone but would like to further review the findings at her NCV/EMG on 04/26/20.

## 2020-03-29 NOTE — Telephone Encounter (Signed)
IMPRESSION:   MRI lumbar spine (without) demonstrating: - At L3-4: disc bulging and facet hypertrophy with moderate right foraminal stenosis. - At L2-3: disc bulging and facet hypertrophy with mild biforaminal stenosis. - At L4-5: disc bulging and facet hypertrophy with mild right foraminal stenosis. - Rotoscoliosis convex left centered at L3.   Please call patient, MRI of lumbar spine showed evidence of previous anterior fusion of T10-11 vertebral bodies, rotoscoliosis convex left centered at L3,  Multilevel degenerative changes, with evidence of variable degree foraminal stenosis, most noticeable at L 34, with moderate right foraminal stenosis  Above findings would explain her worsening radiating pain to right lower extremity  Keep EMG nerve conduction study April 26, 2020,

## 2020-04-03 ENCOUNTER — Other Ambulatory Visit: Payer: Self-pay | Admitting: Family Medicine

## 2020-04-26 ENCOUNTER — Ambulatory Visit (INDEPENDENT_AMBULATORY_CARE_PROVIDER_SITE_OTHER): Payer: Medicare Other | Admitting: Neurology

## 2020-04-26 ENCOUNTER — Ambulatory Visit: Payer: Medicare Other | Admitting: Neurology

## 2020-04-26 ENCOUNTER — Other Ambulatory Visit: Payer: Self-pay

## 2020-04-26 DIAGNOSIS — R799 Abnormal finding of blood chemistry, unspecified: Secondary | ICD-10-CM | POA: Diagnosis not present

## 2020-04-26 DIAGNOSIS — G8929 Other chronic pain: Secondary | ICD-10-CM

## 2020-04-26 DIAGNOSIS — G63 Polyneuropathy in diseases classified elsewhere: Secondary | ICD-10-CM | POA: Diagnosis not present

## 2020-04-26 DIAGNOSIS — R202 Paresthesia of skin: Secondary | ICD-10-CM

## 2020-04-26 DIAGNOSIS — M5441 Lumbago with sciatica, right side: Secondary | ICD-10-CM

## 2020-04-26 DIAGNOSIS — E538 Deficiency of other specified B group vitamins: Secondary | ICD-10-CM | POA: Diagnosis not present

## 2020-04-26 DIAGNOSIS — R7989 Other specified abnormal findings of blood chemistry: Secondary | ICD-10-CM | POA: Diagnosis not present

## 2020-04-26 DIAGNOSIS — R269 Unspecified abnormalities of gait and mobility: Secondary | ICD-10-CM | POA: Insufficient documentation

## 2020-04-26 MED ORDER — GABAPENTIN 100 MG PO CAPS: 300.0000 mg | ORAL_CAPSULE | Freq: Every day | ORAL | 4 refills | Status: DC

## 2020-04-26 NOTE — Progress Notes (Signed)
HISTORICAL  Kimberly Suarez is a 79 year old female, seen in request by her primary care doctor [name], for evaluation of bilateral hand and feet paresthesia, initial evaluation was on March 11, 2020.  I reviewed and summarized the referring note.  Past medical history HTN HLD   She began to experience right hand paresthesia since 2017, she described fingers numbness tingling, burning sensation, mostly involving right first and second fingers, especially at nighttime, sometimes wake her up from sleep, she has been wearing Copper infused gloves, which has been helpful, her left hand bother her occasionally, but is much less severe  She also has occasionally right foot numbness tingling recently, denies significant pain, she has no gait abnormality.  She denies significant neck pain, does complains of low back pain, radiating pain to bilateral lower extremity, since 2020, she also noticed worsening bowel incontinence, sometimes 1 she got up from prolonged sitting position, she has mild bowel incontinence Her older sister passed away from multiple sclerosis in her 28s,  Laboratory evaluation in February 2021, showed normal TSH, A1c 6.5, BMP showed elevated creatinine 1.73, with GFR of 28, calcium was elevated 10.5,  UPDATE April 26 2020: We personally reviewed MRI of lumbar spine in July 2021, multilevel degenerative disc disease, rotoscoliosis convex left centered at L3, moderate right foraminal stenosis at L3-4, no significant canal stenosis  She return for electrodiagnostic study today, there is evidence of length dependent mild to moderate axonal peripheral neuropathy, there is also evidence of chronic bilateral lumbosacral radiculopathy  Patient complains of slow worsening bilateral feet numbness, "I could not feel my feet on the ground,", slow worsening gait abnormality, urinary urgency, incontinence  REVIEW OF SYSTEMS: Full 14 system review of systems performed and  notable only for as above All other review of systems were negative.  ALLERGIES: No Known Allergies  HOME MEDICATIONS: Current Outpatient Medications  Medication Sig Dispense Refill  . aspirin 81 MG tablet Take 81 mg by mouth daily.      Marland Kitchen atorvastatin (LIPITOR) 40 MG tablet TAKE 1 TABLET BY MOUTH  DAILY 90 tablet 0  . b complex vitamins capsule Take 1 capsule by mouth daily.    . enalapril (VASOTEC) 10 MG tablet TAKE 1 TABLET BY MOUTH  TWICE DAILY 180 tablet 0  . fluticasone (FLONASE) 50 MCG/ACT nasal spray Place 1 spray into both nostrils 2 (two) times daily as needed for allergies or rhinitis. 16 g 6  . gabapentin (NEURONTIN) 100 MG capsule Take 200 mg by mouth at bedtime.    . Glucosamine-Chondroitin (GLUCOSAMINE CHONDR COMPLEX PO) Take 1 tablet by mouth 2 (two) times daily.     . hydrochlorothiazide (HYDRODIURIL) 25 MG tablet TAKE 1 TABLET BY MOUTH  DAILY 90 tablet 0  . Multiple Vitamins-Minerals (MULTIVITAMIN ADULT PO) Take 1 tablet by mouth daily.    . Omega-3 Fatty Acids (FISH OIL) 1000 MG CAPS Take 1 capsule by mouth daily.     Marland Kitchen VITAMIN D PO Take by mouth.     No current facility-administered medications for this visit.    PAST MEDICAL HISTORY: Past Medical History:  Diagnosis Date  . ASCUS (atypical squamous cells of undetermined significance) on Pap smear 2011x2   Negative high-risk HPV, normal Pap smears 2012/2013  . Atypical nevus 05/02/2007   right back-moderate   . Cancer ([place])   . Cataract   . Diverticulosis   . GERD (gastroesophageal reflux disease)   . Hiatal hernia   . Hyperlipemia   .  Hypertension   . IBS (irritable bowel syndrome)   . Lymphocytic colitis 2007  . Neuropathy   . SCC (squamous cell carcinoma) 01/19/2006   bridge of nose (CX35FU)  . SCC (squamous cell carcinoma) 03/08/2017   right inner lower shin (CX35FU)  . SCC (squamous cell carcinoma) x 2 02/24/2002   right bridge of nose (Cx35FU),right bridge of nose  . Uterine prolapse     Pessary    PAST SURGICAL HISTORY: Past Surgical History:  Procedure Laterality Date  . BREAST BIOPSY     unsure which breast  . CATARACT EXTRACTION W/ INTRAOCULAR LENS IMPLANT Bilateral     FAMILY HISTORY: Family History  Problem Relation Age of Onset  . Heart disease Mother   . Diabetes Brother        BKA  . COPD Father   . Asthma Father   . Alzheimer's disease Sister   . Alzheimer's disease Brother   . Multiple sclerosis Sister   . Heart disease Sister   . Diabetes Brother   . Early death Brother        died in war  . Colon cancer Neg Hx   . Kidney disease Neg Hx   . Liver disease Neg Hx     SOCIAL HISTORY: Social History   Socioeconomic History  . Marital status: Married    Spouse name: Not on file  . Number of children: 3   . Years of education: Not on file  . Highest education level: 8th grade  Occupational History  . Occupation: Retired   Tobacco Use  . Smoking status: Never Smoker  . Smokeless tobacco: Never Used  Vaping Use  . Vaping Use: Never used  Substance and Sexual Activity  . Alcohol use: Yes    Alcohol/week: 0.0 standard drinks    Comment: Rare  . Drug use: No  . Sexual activity: Yes    Birth control/protection: Post-menopausal    Comment: 1st intercourse 35 yo--1 partner  Other Topics Concern  . Not on file  Social History Narrative   Lives at home with husband.   Right-handed.   Caffeine use: one cup caffeine some days.   Social Determinants of Health   Financial Resource Strain:   . Difficulty of Paying Living Expenses: Not on file  Food Insecurity:   . Worried About Charity fundraiser in the Last Year: Not on file  . Ran Out of Food in the Last Year: Not on file  Transportation Needs:   . Lack of Transportation (Medical): Not on file  . Lack of Transportation (Non-Medical): Not on file  Physical Activity:   . Days of Exercise per Week: Not on file  . Minutes of Exercise per Session: Not on file  Stress:   . Feeling of  Stress : Not on file  Social Connections:   . Frequency of Communication with Friends and Family: Not on file  . Frequency of Social Gatherings with Friends and Family: Not on file  . Attends Religious Services: Not on file  . Active Member of Clubs or Organizations: Not on file  . Attends Archivist Meetings: Not on file  . Marital Status: Not on file  Intimate Partner Violence:   . Fear of Current or Ex-Partner: Not on file  . Emotionally Abused: Not on file  . Physically Abused: Not on file  . Sexually Abused: Not on file     PHYSICAL EXAM   There were no vitals filed for this visit.  Not recorded     There is no height or weight on file to calculate BMI.  PHYSICAL EXAMNIATION:  Gen: NAD, conversant, well nourised, well groomed      NEUROLOGICAL EXAM:  MENTAL STATUS: Speech/cognition: Awake alert oriented to history taking and casual conversation   CRANIAL NERVES: CN II: Visual fields are full to confrontation. Pupils are round equal and briskly reactive to light. CN III, IV, VI: extraocular movement are normal. No ptosis. CN V: Facial sensation is intact to light touch CN VII: Face is symmetric with normal eye closure  CN VIII: Hearing is normal to causal conversation. CN IX, X: Phonation is normal. CN XI: Head turning and shoulder shrug are intact  MOTOR: She has right hammertoes, moderate right toe extension weakness,  slight right ankle dorsiflexion weakness REFLEXES: Reflexes are trace and symmetric at the biceps, triceps, knees, and absent at ankles. Plantar responses are flexor.  SENSORY: Length dependent decreased to light touch, pinprick and vibratory sensation to ankle level  COORDINATION: There is no trunk or limb dysmetria noted.  GAIT/STANCE: She needs push-up to get up from seated position, wide-based, cautious, mildly unsteady, slight right foot drop   DIAGNOSTIC DATA (LABS, IMAGING, TESTING) - I reviewed patient records, labs,  notes, testing and imaging myself where available.   ASSESSMENT AND PLAN  ARSEMA TUSING is a 79 y.o. female   Chronic low back pain, worsening radiating pain to right lower extremity, occasionally bowel incontinence Bilateral upper lower extremity paresthesia Gait abnormality  MRI of lumbar spine showed rotoscoliosis convex left centered at left 3, moderate right foraminal stenosis L3-4, L4-5  She has evidence of mild right distal leg weakness, right hammertoes,   EMG nerve conduction study confirmed moderate length dependent peripheral neuropathy, with superimposed chronic bilateral lumbosacral radiculopathy  Her gait abnormality multifactorial, peripheral neuropathy, right distal weakness from lumbosacral radiculopathy, deconditioning, aging,  Laboratory evaluation for etiology of peripheral neuropathy  Marcial Pacas, M.D. Ph.D.  [facility] [address] [place], [place] [address] Ph: [phone] Fax: [phone]  CC:  [name], DO [address] [place],  [place] [address]

## 2020-04-26 NOTE — Procedures (Signed)
Full Name: Kimberly Suarez Gender: Female MRN #: [id number] Date of Birth: 1941-01-16    Visit Date: 04/26/2020 10:09 Age: 79 Years Examining Physician: Marcial Pacas, MD  Referring Physician: Marcial Pacas, MD Height: 5 feet 3 inch History: 79 year old female with progressive bilateral feet paresthesia, gait abnormality  Summary of the test: Nerve conduction study: Right sural, superficial peroneal sensory responses were absent Right ulnar, median, radial sensory response showed moderately decreased snap amplitude  Right tibial motor response showed significantly decreased CMAP amplitude.  Right peroneal to EDB, ulnar motor responses were normal. Right median motor response showed moderately prolonged distal latency, with mildly decreased CMAP amplitude  Electromyography: Selected needle examination of bilateral lower extremity, bilateral lumbosacral paraspinal muscles; right upper extremity, right cervical paraspinal muscles were performed.  There was evidence of chronic neuropathic changes involving bilateral L4-5 S1 myotomes, there was also evidence of polyphasic motor unit potentials at bilateral lumbosacral paraspinal muscles, suggest chronic remodeling, neuropathic process.  There was also evidence of chronic neuropathic changes involving right abductor pollicis brevis, no evidence of active denervation.   Conclusion: This is an abnormal study.  There is electrodiagnostic evidence of moderate length dependent axonal sensorimotor polyneuropathy, with chronic bilateral lumbosacral radiculopathy, involving bilateral L4-5 S1 nerve roots.  In addition, there is evidence of moderate to severe right carpal tunnel syndromes, demyelinating in nature.    ------------------------------- Marcial Pacas, M.D. PhD  [facility] [address], [place] [address] Tel: [phone] Fax: [phone]  Verbal informed consent was obtained from the patient, patient was  informed of potential risk of procedure, including bruising, bleeding, hematoma formation, infection, muscle weakness, muscle pain, numbness, among others.         [place]    Nerve / Sites Muscle Latency Ref. Amplitude Ref. Rel Amp Segments Distance Velocity Ref. Area    ms ms mV mV %  cm m/s m/s mVms  R Median - APB     Wrist APB 6.8 ?4.4 3.4 ?4.0 100 Wrist - APB 7   11.4     Upper arm APB 11.9  2.7  77.8 Upper arm - Wrist 21 41 ?49 9.9  R Ulnar - ADM     Wrist ADM 2.9 ?3.3 8.4 ?6.0 100 Wrist - ADM 7   20.4     B.Elbow ADM 6.8  7.5  89.2 B.Elbow - Wrist 20 52 ?49 20.1     A.Elbow ADM 8.6  7.6  101 A.Elbow - B.Elbow 10 55 ?49 19.7         A.Elbow - Wrist      R Peroneal - EDB     Ankle EDB 6.5 ?6.5 2.2 ?2.0 100 Ankle - EDB 9   8.3     Fib head EDB 13.8  2.0  88.8 Fib head - Ankle 28 38 ?44 8.4     Pop fossa EDB 16.4  1.9  98.4 Pop fossa - Fib head 10 39 ?44 8.0         Pop fossa - Ankle      R Tibial - AH     Ankle AH 4.4 ?5.8 0.8 ?4.0 100 Ankle - AH 9   2.8     Pop fossa AH 15.4  0.4  56.4 Pop fossa - Ankle 38 35 ?41 1.7             SNC    Nerve / Sites Rec. Site Peak Lat Ref.  Amp Ref. Segments Distance  ms ms V V  cm  R Radial - Anatomical snuff box (Forearm)     Forearm Wrist 2.8 ?2.9 8 ?15 Forearm - Wrist 10  R Sural - Ankle (Calf)     Calf Ankle NR ?4.4 NR ?6 Calf - Ankle 14  R Superficial peroneal - Ankle     Lat leg Ankle NR ?4.4 NR ?6 Lat leg - Ankle 14  R Median - Orthodromic (Dig II, Mid palm)     Dig II Wrist 3.3 ?3.4 5 ?10 Dig II - Wrist 13  R Ulnar - Orthodromic, (Dig V, Mid palm)     Dig V Wrist 3.1 ?3.1 3 ?5 Dig V - Wrist 27               F  Wave    Nerve F Lat Ref.   ms ms  R Tibial - AH NR ?56.0  R Ulnar - ADM 31.4 ?32.0          EMG Summary Table    Spontaneous MUAP Recruitment  Muscle IA Fib PSW Fasc Other Amp Dur. Poly Pattern  R. Tibialis anterior Increased None None None _______ Increased Increased 1+ Reduced  R. Tibialis posterior Increased  None None None _______ Increased Increased 1+ Reduced  R. Peroneus longus Increased None None None _______ Increased Increased 1+ Reduced  R. Gastrocnemius (Medial head) Increased None None None _______ Increased Increased 1+ Reduced  R. Vastus lateralis Normal None None None _______ Increased Increased 1+ Reduced  L. Tibialis anterior Increased None None None _______ Increased Increased 1+ Reduced  L. Tibialis posterior Increased None None None _______ Increased Increased 1+ Reduced  L. Peroneus longus Normal None None None _______ Increased Increased 1+ Reduced  L. Vastus lateralis Normal None None None _______ Increased Increased 1+ Reduced  R. Lumbar paraspinals (mid) Increased None None None _______ Decreased Increased 1+ Normal  R. Lumbar paraspinals (low) Increased None None None _______ Decreased Increased 1+ Normal  L. Lumbar paraspinals (low) Increased None None None _______ Decreased Increased 1+ Normal  L. Lumbar paraspinals (mid) Increased None None None _______ Decreased Increased 1+ Normal  R. Abductor pollicis brevis Increased None None None _______ Increased Increased 1+ Reduced  R. First dorsal interosseous Normal None None None _______ Normal Normal Normal Normal  R. Pronator teres Normal None None None _______ Normal Normal Normal Normal  R. Biceps brachii Normal None None None _______ Normal Normal Normal Normal  R. Deltoid Normal None None None _______ Normal Normal Normal Normal  R. Thoracic paraspinals Normal None None None _______ Normal Normal Normal Normal  R. Cervical paraspinals Normal None None None _______ Normal Normal Normal Normal

## 2020-04-27 LAB — IMMUNOFIXATION ELECTROPHORESIS
IgG (Immunoglobin G), Serum: 1524 mg/dL (ref 586–1602)
IgM (Immunoglobulin M), Srm: 51 mg/dL (ref 26–217)
Immunoglobulin A, (IgA) QN, Serum: 115 mg/dL (ref 64–422)
Total Protein: 7 g/dL (ref 6.0–8.5)

## 2020-04-27 LAB — VITAMIN B12: Vitamin B-12: >2000 pg/mL — ABNORMAL HIGH (ref 232–1245)

## 2020-04-27 LAB — ANA W/REFLEX IF POSITIVE: Anti Nuclear Antibody (ANA): NEGATIVE

## 2020-04-27 LAB — FOLATE: Folate: >20 ng/mL

## 2020-04-27 LAB — C-REACTIVE PROTEIN: CRP: 3 mg/L (ref 0–10)

## 2020-04-27 LAB — SEDIMENTATION RATE: Sed Rate: 9 mm/h (ref 0–40)

## 2020-04-27 LAB — TSH: TSH: 1.83 u[IU]/mL (ref 0.450–4.500)

## 2020-04-27 LAB — CK: Total CK: 114 U/L (ref 32–182)

## 2020-05-05 ENCOUNTER — Ambulatory Visit: Payer: Medicare Other | Admitting: Family Medicine

## 2020-05-10 DIAGNOSIS — N1832 Chronic kidney disease, stage 3b: Secondary | ICD-10-CM | POA: Diagnosis not present

## 2020-05-10 DIAGNOSIS — I129 Hypertensive chronic kidney disease with stage 1 through stage 4 chronic kidney disease, or unspecified chronic kidney disease: Secondary | ICD-10-CM | POA: Diagnosis not present

## 2020-05-10 DIAGNOSIS — K219 Gastro-esophageal reflux disease without esophagitis: Secondary | ICD-10-CM | POA: Diagnosis not present

## 2020-05-28 ENCOUNTER — Telehealth: Payer: Self-pay | Admitting: Neurology

## 2020-05-28 NOTE — Telephone Encounter (Signed)
Pt is asking to be called with the results to her lab work, pt states she never received an After Visit Summary.  Pt asking if she is to be called on Monday that it is either before 12 noon or anytime after 3:00

## 2020-05-28 NOTE — Telephone Encounter (Addendum)
She was sent a mychart message with her lab findings on 04/28/20. She did not see the results and I was able to provide them to her today.

## 2020-06-18 ENCOUNTER — Telehealth: Payer: Self-pay

## 2020-06-18 NOTE — Telephone Encounter (Signed)
Patient states she is getting better and will stay hydrated will monitor symptoms and go to urgent care if gets worse

## 2020-06-21 IMAGING — DX DG ELBOW 2V*R*
2 series · 2 of 2 positions shown · non-contrast
Comparison: None.

CLINICAL DATA: Pain

EXAM:
RIGHT ELBOW - 2 VIEW

[elbow ap]
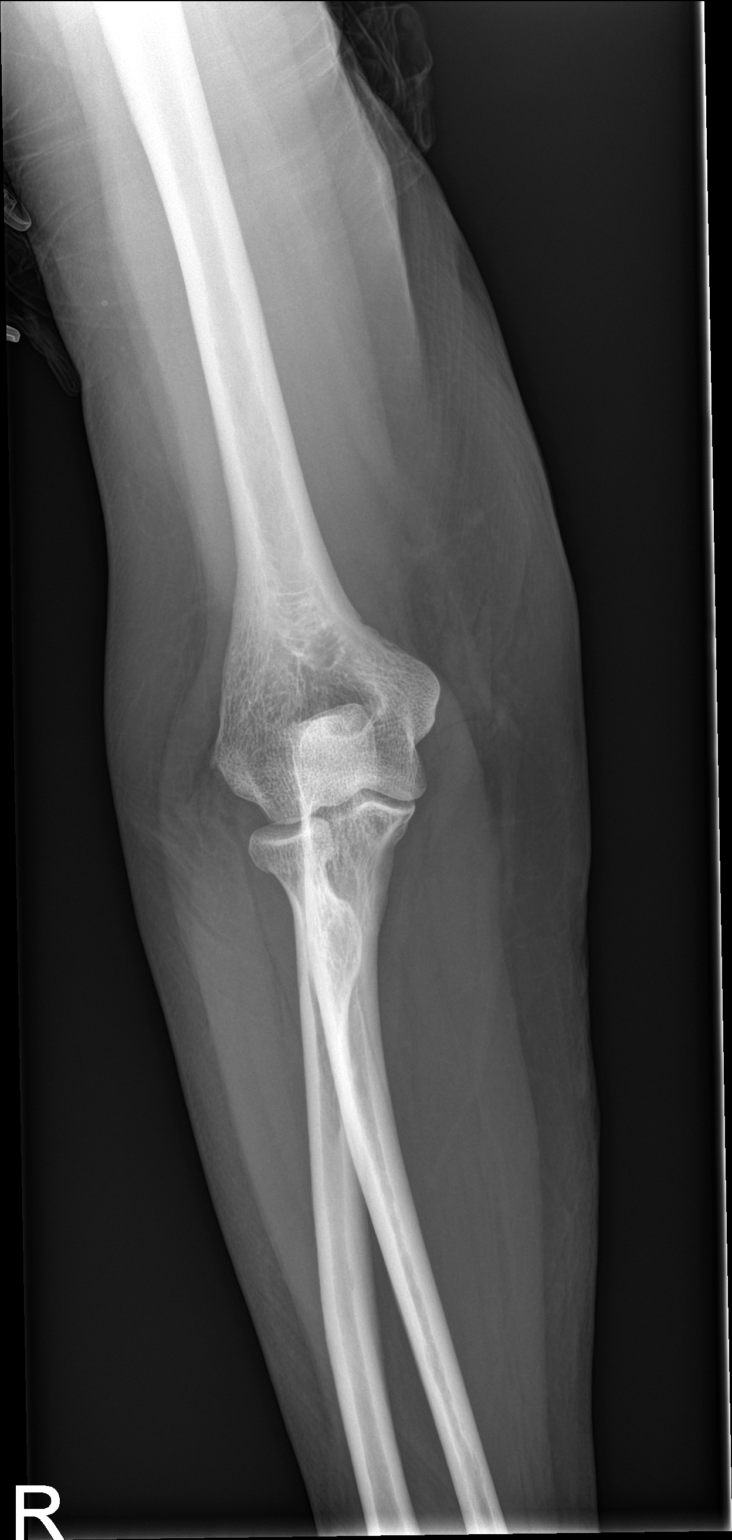

[elbow lat]
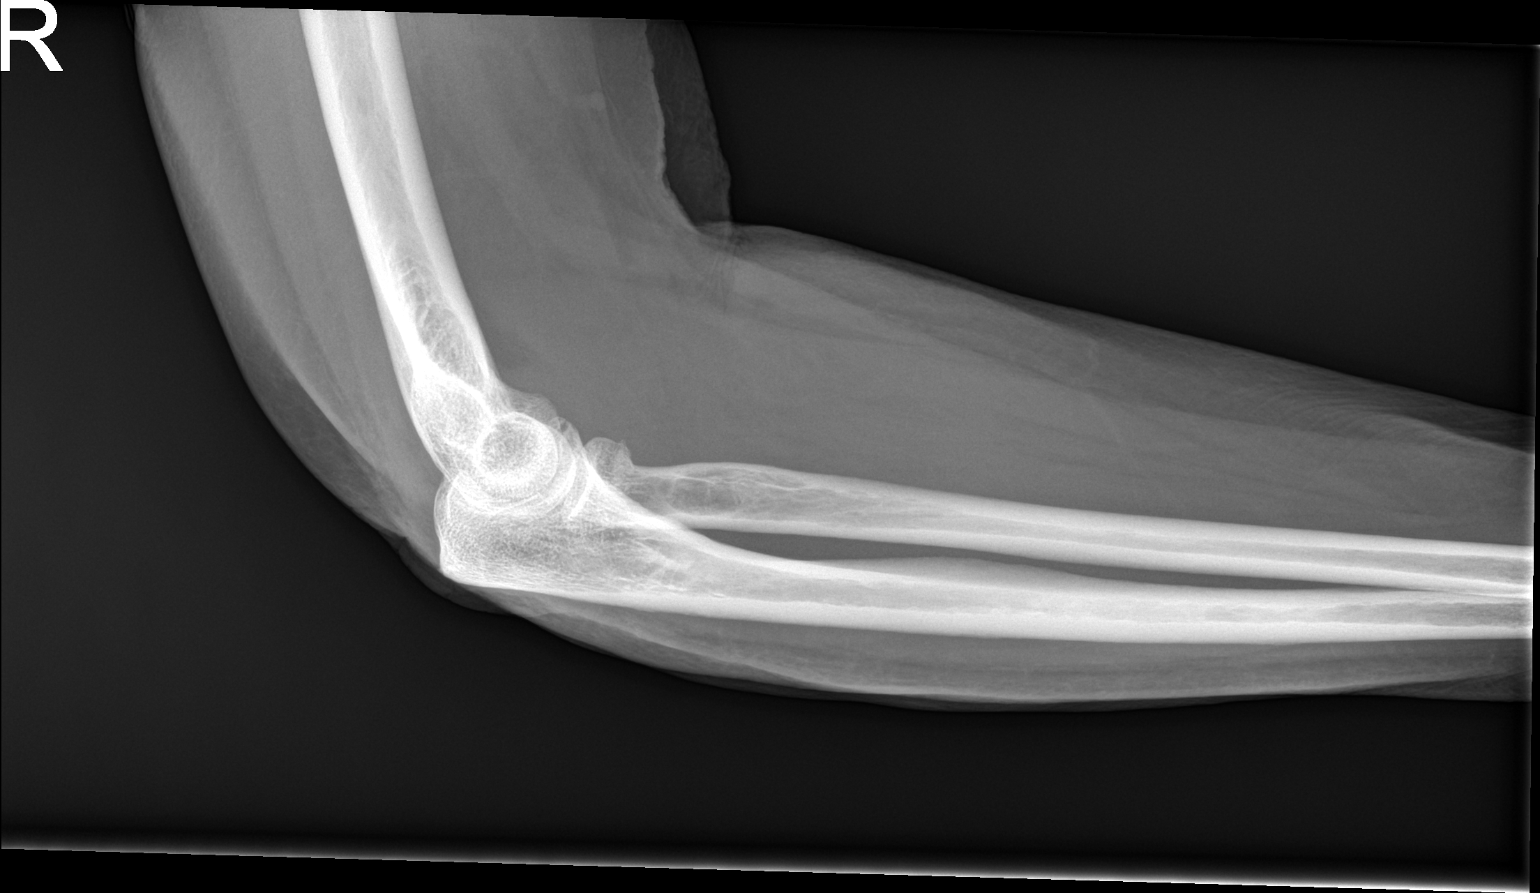

[2 of 2 positions shown; findings below may reference images not displayed]

FINDINGS: Frontal and lateral views were obtained. There is a subtle fracture
of the proximal radial metaphysis with impaction in this area. No
other fracture evident. No dislocation. There is hemarthrosis within
the elbow joint. No joint space narrowing or erosion.
IMPRESSION: Fracture proximal radial metaphysis with subtle impaction in this
area. No other fracture. No dislocation. No appreciable arthropathic
change.

These results will be called to the ordering clinician or
representative by the Radiologist Assistant, and communication
documented in the PACS or [REDACTED].

## 2020-06-25 ENCOUNTER — Other Ambulatory Visit: Payer: Self-pay

## 2020-06-25 ENCOUNTER — Ambulatory Visit (INDEPENDENT_AMBULATORY_CARE_PROVIDER_SITE_OTHER): Payer: Medicare Other | Admitting: Family Medicine

## 2020-06-25 ENCOUNTER — Encounter: Payer: Self-pay | Admitting: Family Medicine

## 2020-06-25 VITALS — BP 100/58 | HR 76 | Temp 98.1°F | Ht 63.0 in | Wt 151.8 lb

## 2020-06-25 DIAGNOSIS — N1832 Chronic kidney disease, stage 3b: Secondary | ICD-10-CM

## 2020-06-25 DIAGNOSIS — E7800 Pure hypercholesterolemia, unspecified: Secondary | ICD-10-CM | POA: Diagnosis not present

## 2020-06-25 DIAGNOSIS — Z23 Encounter for immunization: Secondary | ICD-10-CM

## 2020-06-25 DIAGNOSIS — K219 Gastro-esophageal reflux disease without esophagitis: Secondary | ICD-10-CM

## 2020-06-25 DIAGNOSIS — K222 Esophageal obstruction: Secondary | ICD-10-CM | POA: Diagnosis not present

## 2020-06-25 DIAGNOSIS — I10 Essential (primary) hypertension: Secondary | ICD-10-CM

## 2020-06-25 MED ORDER — FAMOTIDINE 20 MG PO TABS: 20.0000 mg | ORAL_TABLET | Freq: Every day | ORAL | 1 refills | Status: DC

## 2020-06-25 NOTE — Patient Instructions (Addendum)
You had labs performed today.  You will be contacted with the results of the labs once they are available, usually in the next 3 business days for routine lab work.  If you have an active my chart account, they will be released to your MyChart.  If you prefer to have these labs released to you via telephone, please let us know.  If you had a pap smear or biopsy performed, expect to be contacted in about 7-10 days.  Pepcid sent to pharmacy

## 2020-06-25 NOTE — Progress Notes (Signed)
Subjective: CC: Hypertension with hyperlipidemia, CKD 3 PCP: Kimberly Norlander, DO Kimberly Suarez is a 79 y.o. female presenting to clinic today for:  1.  Hypertension with hyperlipidemia and CKD 3 Patient reports compliance with her Lipitor 40 mg daily, enalapril 10 mg daily, hydrochlorothiazide 25 mg daily.  She is followed by nephrology and had a recent checkup.  No chest pain, shortness of breath, edema but she does report intermittent dizziness.  She has reports waking up with dry mouth  2.  GERD Patient was previously on a PPI but this was discontinued at her renal follow-up.  She is asking to have Pepcid instead sent to her pharmacy.   ROS: Per HPI  No Known Allergies Past Medical History:  Diagnosis Date   ASCUS (atypical squamous cells of undetermined significance) on Pap smear 2011x2   Negative high-risk HPV, normal Pap smears 2012/2013   Atypical nevus 05/02/2007   right back-moderate    Cancer ([place])    Cataract    Diverticulosis    GERD (gastroesophageal reflux disease)    Hiatal hernia    Hyperlipemia    Hypertension    IBS (irritable bowel syndrome)    Lymphocytic colitis 2007   Neuropathy    SCC (squamous cell carcinoma) 01/19/2006   bridge of nose (CX35FU)   SCC (squamous cell carcinoma) 03/08/2017   right inner lower shin (CX35FU)   SCC (squamous cell carcinoma) x 2 02/24/2002   right bridge of nose (Cx35FU),right bridge of nose   Uterine prolapse    Pessary    Current Outpatient Medications:    aspirin 81 MG tablet, Take 81 mg by mouth daily.  , Disp: , Rfl:    atorvastatin (LIPITOR) 40 MG tablet, TAKE 1 TABLET BY MOUTH  DAILY, Disp: 90 tablet, Rfl: 0   b complex vitamins capsule, Take 1 capsule by mouth daily., Disp: , Rfl:    enalapril (VASOTEC) 10 MG tablet, TAKE 1 TABLET BY MOUTH  TWICE DAILY, Disp: 180 tablet, Rfl: 0   fluticasone (FLONASE) 50 MCG/ACT nasal spray, Place 1 spray into both nostrils 2 (two) times daily  as needed for allergies or rhinitis., Disp: 16 g, Rfl: 6   gabapentin (NEURONTIN) 100 MG capsule, Take 3 capsules (300 mg total) by mouth at bedtime., Disp: 270 capsule, Rfl: 4   Glucosamine-Chondroitin (GLUCOSAMINE CHONDR COMPLEX PO), Take 1 tablet by mouth 2 (two) times daily. , Disp: , Rfl:    hydrochlorothiazide (HYDRODIURIL) 25 MG tablet, TAKE 1 TABLET BY MOUTH  DAILY, Disp: 90 tablet, Rfl: 0   Multiple Vitamins-Minerals (MULTIVITAMIN ADULT PO), Take 1 tablet by mouth daily., Disp: , Rfl:    Omega-3 Fatty Acids (FISH OIL) 1000 MG CAPS, Take 1 capsule by mouth daily. , Disp: , Rfl:    VITAMIN D PO, Take by mouth., Disp: , Rfl:  Social History   Socioeconomic History   Marital status: Married    Spouse name: Not on file   Number of children: 3    Years of education: Not on file   Highest education level: 8th grade  Occupational History   Occupation: Retired   Tobacco Use   Smoking status: Never Smoker   Smokeless tobacco: Never Used  Scientific laboratory technician Use: Never used  Substance and Sexual Activity   Alcohol use: Yes    Alcohol/week: 0.0 standard drinks    Comment: Rare   Drug use: No   Sexual activity: Yes    Birth control/protection: Post-menopausal  Comment: 1st intercourse 81 yo--1 partner  Other Topics Concern   Not on file  Social History Narrative   Lives at home with husband.   Right-handed.   Caffeine use: one cup caffeine some days.   Social Determinants of Health   Financial Resource Strain:    Difficulty of Paying Living Expenses: Not on file  Food Insecurity:    Worried About Charity fundraiser in the Last Year: Not on file   [employer] of Food in the Last Year: Not on file  Transportation Needs:    Lack of Transportation (Medical): Not on file   Lack of Transportation (Non-Medical): Not on file  Physical Activity:    Days of Exercise per Week: Not on file   Minutes of Exercise per Session: Not on file  Stress:    Feeling  of Stress : Not on file  Social Connections:    Frequency of Communication with Friends and Family: Not on file   Frequency of Social Gatherings with Friends and Family: Not on file   Attends Religious Services: Not on file   Active Member of Clubs or Organizations: Not on file   Attends Archivist Meetings: Not on file   Marital Status: Not on file  Intimate Partner Violence:    Fear of Current or Ex-Partner: Not on file   Emotionally Abused: Not on file   Physically Abused: Not on file   Sexually Abused: Not on file   Family History  Problem Relation Age of Onset   Heart disease Mother    Diabetes Brother        BKA   COPD Father    Asthma Father    Alzheimer's disease Sister    Alzheimer's disease Brother    Multiple sclerosis Sister    Heart disease Sister    Diabetes Brother    Early death Brother        died in war   Colon cancer Neg Hx    Kidney disease Neg Hx    Liver disease Neg Hx     Objective: Office vital signs reviewed. BP (!) 100/58    Pulse 76    Temp 98.1 F (36.7 C)    Ht 5' 3"  (1.6 m)    Wt 151 lb 12.8 oz (68.9 kg)    SpO2 97%    BMI 26.89 kg/m   Physical Examination:  General: Awake, alert, well appearing elderly female, No acute distress HEENT: Normal, sclera white.  Moist mucous membranes Cardio: regular rate and rhythm, S1S2 heard, no murmurs appreciated Pulm: clear to auscultation bilaterally, no wheezes, rhonchi or rales; normal work of breathing on room air GI: Nondistended Extremities: warm, well perfused, No edema, cyanosis or clubbing; +2 pulses bilaterally MSK: antalgic gait and station  Assessment/ Plan: 79 y.o. female   1. Chronic kidney disease, stage 3b [facility]) Being followed closely by nephrology.  Check CBC - CBC  2. Essential hypertension Borderline low.  We discussed cutting her hydrochlorothiazide in half and monitoring blood pressures closely.  I think this will resolve the intermittent  dizziness she has been experiencing as well.  May need to consider discontinuing the hydrochlorothiazide totally - CMP14+EGFR  3. GERD with stricture Replaced PPI with Pepcid.  This has been sent - famotidine (PEPCID) 20 MG tablet; Take 1 tablet (20 mg total) by mouth daily.  Dispense: 90 tablet; Refill: 1  4. Pure hypercholesterolemia Check lipid panel, liver function tests - CMP14+EGFR - Lipid Panel  5.  Need for immunization against influenza Administered - Flu Vaccine QUAD High Dose(Fluad)   Orders Placed This Encounter  Procedures   Flu Vaccine QUAD High Dose(Fluad)   CMP14+EGFR   Lipid Panel   CBC   Meds ordered this encounter  Medications   famotidine (PEPCID) 20 MG tablet    Sig: Take 1 tablet (20 mg total) by mouth daily.    Dispense:  90 tablet    Refill:  [place], [place] [phone]

## 2020-06-26 LAB — CMP14+EGFR
ALT: 20 [IU]/L (ref 0–32)
AST: 23 [IU]/L (ref 0–40)
Albumin/Globulin Ratio: 1.4 (ref 1.2–2.2)
Albumin: 4 g/dL (ref 3.7–4.7)
Alkaline Phosphatase: 50 [IU]/L (ref 44–121)
BUN/Creatinine Ratio: 14 (ref 12–28)
BUN: 21 mg/dL (ref 8–27)
Bilirubin Total: 0.3 mg/dL (ref 0.0–1.2)
CO2: 25 mmol/L (ref 20–29)
Calcium: 9.2 mg/dL (ref 8.7–10.3)
Chloride: 99 mmol/L (ref 96–106)
Creatinine, Ser: 1.47 mg/dL — ABNORMAL HIGH (ref 0.57–1.00)
GFR calc Af Amer: 39 mL/min/{1.73_m2} — ABNORMAL LOW
GFR calc non Af Amer: 34 mL/min/{1.73_m2} — ABNORMAL LOW
Globulin, Total: 2.8 g/dL (ref 1.5–4.5)
Glucose: 102 mg/dL — ABNORMAL HIGH (ref 65–99)
Potassium: 3.9 mmol/L (ref 3.5–5.2)
Sodium: 140 mmol/L (ref 134–144)
Total Protein: 6.8 g/dL (ref 6.0–8.5)

## 2020-06-26 LAB — LIPID PANEL
Chol/HDL Ratio: 3.5 ratio (ref 0.0–4.4)
Cholesterol, Total: 144 mg/dL (ref 100–199)
HDL: 41 mg/dL
LDL Chol Calc (NIH): 83 mg/dL (ref 0–99)
Triglycerides: 108 mg/dL (ref 0–149)
VLDL Cholesterol Cal: 20 mg/dL (ref 5–40)

## 2020-06-26 LAB — CBC
Hematocrit: 34.8 % (ref 34.0–46.6)
Hemoglobin: 12.2 g/dL (ref 11.1–15.9)
MCH: 31.3 pg (ref 26.6–33.0)
MCHC: 35.1 g/dL (ref 31.5–35.7)
MCV: 89 fL (ref 79–97)
Platelets: 234 10*3/uL (ref 150–450)
RBC: 3.9 x10E6/uL (ref 3.77–5.28)
RDW: 12.7 % (ref 11.7–15.4)
WBC: 9.5 10*3/uL (ref 3.4–10.8)

## 2020-06-27 ENCOUNTER — Other Ambulatory Visit: Payer: Self-pay | Admitting: Family Medicine

## 2020-06-28 ENCOUNTER — Other Ambulatory Visit: Payer: Self-pay | Admitting: Family Medicine

## 2020-06-28 MED ORDER — ATORVASTATIN CALCIUM 40 MG PO TABS: 40.0000 mg | ORAL_TABLET | Freq: Every day | ORAL | 3 refills | Status: DC

## 2020-07-13 ENCOUNTER — Telehealth: Payer: Self-pay

## 2020-07-13 NOTE — Telephone Encounter (Signed)
Reviewed results with pt .

## 2020-07-21 ENCOUNTER — Encounter: Payer: Self-pay | Admitting: Family Medicine

## 2020-08-17 ENCOUNTER — Telehealth: Payer: Self-pay | Admitting: Gastroenterology

## 2020-08-17 NOTE — Telephone Encounter (Signed)
The pt has not been seen since 2017.  She has a return of diarrhea and has been scheduled to see [name] per her request.  She will also follow up with PCP in the meantime.

## 2020-09-24 ENCOUNTER — Encounter: Payer: Self-pay | Admitting: Gastroenterology

## 2020-09-24 ENCOUNTER — Other Ambulatory Visit: Payer: Self-pay

## 2020-09-24 ENCOUNTER — Ambulatory Visit: Payer: Medicare Other | Admitting: Gastroenterology

## 2020-09-24 VITALS — BP 120/60 | HR 80 | Ht 63.0 in | Wt 151.8 lb

## 2020-09-24 DIAGNOSIS — R197 Diarrhea, unspecified: Secondary | ICD-10-CM | POA: Diagnosis not present

## 2020-09-24 DIAGNOSIS — K52839 Microscopic colitis, unspecified: Secondary | ICD-10-CM | POA: Diagnosis not present

## 2020-09-24 DIAGNOSIS — B370 Candidal stomatitis: Secondary | ICD-10-CM | POA: Diagnosis not present

## 2020-09-24 MED ORDER — NONFORMULARY OR COMPOUNDED ITEM: 0 refills | Status: DC

## 2020-09-24 MED ORDER — NYSTATIN 100000 UNIT/ML MT SUSP: 5.0000 mL | Freq: Every day | OROMUCOSAL | 0 refills | Status: DC

## 2020-09-24 NOTE — Progress Notes (Signed)
09/24/2020 Kimberly Suarez [id number] 11-03-1940   HISTORY OF PRESENT ILLNESS: This is a 80 year old female who is a patient of Dr. Vena Rua.  She has a history of microscopic colitis.  Was last treated at the end of 2017/beginning in 2018.  We had to send a prescription for compounded budesonide to [facility] in [place] as other formulations have been too expensive for her.  She responded well to that and says that she has been doing great up until around Thanksgiving.  Since then she has been having intermittent diarrhea.  She denies abdominal pain or blood in her stools.  She is taking Florastor which she thinks helps some, especially with the gas.  She does get occasional incontinence and urgency.  While she is here she also reports a white coating on her tongue in the mornings that has been present for quite some time.  She scrapes the white coating off every morning.  She says she also has some halitosis.  She notes occasional difficulty swallowing, usually just chicken in particular and points to her throat/upper esophagus.   Past Medical History:  Diagnosis Date  . ASCUS (atypical squamous cells of undetermined significance) on Pap smear 2011x2   Negative high-risk HPV, normal Pap smears 2012/2013  . Atypical nevus 05/02/2007   right back-moderate   . Cancer ([place])   . Cataract   . Diverticulosis   . GERD (gastroesophageal reflux disease)   . Hiatal hernia   . Hyperlipemia   . Hypertension   . IBS (irritable bowel syndrome)   . Lymphocytic colitis 2007  . Neuropathy   . SCC (squamous cell carcinoma) 01/19/2006   bridge of nose (CX35FU)  . SCC (squamous cell carcinoma) 03/08/2017   right inner lower shin (CX35FU)  . SCC (squamous cell carcinoma) x 2 02/24/2002   right bridge of nose (Cx35FU),right bridge of nose  . Uterine prolapse    Pessary   Past Surgical History:  Procedure Laterality Date  . BREAST BIOPSY     unsure which breast  . CATARACT  EXTRACTION W/ INTRAOCULAR LENS IMPLANT Bilateral     reports that she has never smoked. She has never used smokeless tobacco. She reports current alcohol use. She reports that she does not use drugs. family history includes Alzheimer's disease in her brother and sister; Asthma in her father; COPD in her father; Diabetes in her brother and brother; Early death in her brother; Heart disease in her mother and sister; Multiple sclerosis in her sister. No Known Allergies    Outpatient Encounter Medications as of 09/24/2020  Medication Sig  . aspirin 81 MG tablet Take 81 mg by mouth daily.  Marland Kitchen atorvastatin (LIPITOR) 40 MG tablet Take 1 tablet (40 mg total) by mouth daily.  Marland Kitchen b complex vitamins capsule Take 1 capsule by mouth daily.  . enalapril (VASOTEC) 10 MG tablet TAKE 1 TABLET BY MOUTH  TWICE DAILY  . famotidine (PEPCID) 20 MG tablet Take 1 tablet (20 mg total) by mouth daily.  . fluticasone (FLONASE) 50 MCG/ACT nasal spray Place 1 spray into both nostrils 2 (two) times daily as needed for allergies or rhinitis.  Marland Kitchen gabapentin (NEURONTIN) 100 MG capsule Take 3 capsules (300 mg total) by mouth at bedtime.  . Glucosamine-Chondroitin (GLUCOSAMINE CHONDR COMPLEX PO) Take 1 tablet by mouth 2 (two) times daily.  . hydrochlorothiazide (HYDRODIURIL) 25 MG tablet TAKE 1 TABLET BY MOUTH  DAILY  . Multiple Vitamins-Minerals (MULTIVITAMIN ADULT PO) Take 1 tablet by mouth  daily.  . Omega-3 Fatty Acids (FISH OIL) 1000 MG CAPS Take 1 capsule by mouth daily.  Marland Kitchen saccharomyces boulardii (FLORASTOR) 250 MG capsule Take 250 mg by mouth 2 (two) times daily.  Marland Kitchen VITAMIN D PO Take by mouth.   No facility-administered encounter medications on file as of 09/24/2020.     REVIEW OF SYSTEMS  : All other systems reviewed and negative except where noted in the History of Present Illness.   PHYSICAL EXAM: BP 120/60   Pulse 80   Ht 5\' 3"  (1.6 m)   Wt 151 lb 12.8 oz (68.9 kg)   BMI 26.89 kg/m  General: Well developed  white female in no acute distress Head: Normocephalic and atraumatic Eyes:  Sclerae anicteric, conjunctiva pink. Ears: Normal auditory acuity Mouth:  No thrush seen today. Lungs: Clear throughout to auscultation; no W/R/R. Heart: Regular rate and rhythm; no M/R/G. Abdomen: Soft, non-distended.  BS present.  Non-tender. Musculoskeletal: Symmetrical with no gross deformities  Skin: No lesions on visible extremities Extremities: No edema  Neurological: Alert oriented x 4, grossly non-focal Psychological:  Alert and cooperative. Normal mood and affect  ASSESSMENT AND PLAN: *Diarrhea: Intermittent since Thanksgiving.  Has history of microscopic colitis that responded to budesonide.  We discussed possibly repeating colonoscopy as her last was over 9 years ago.  She would like to hold off for now and try repeat treatment with budesonide first.  She requires compounded formulation sent to [facility] in [place] as the others have been too expensive.  Will do taper with 10 mg daily, 7.5 mg daily, 5 mg daily to 2.5 mg daily and then discontinue as she had done previously.  Prescription sent. *Describes a white coating on her tongue in the mornings that she scrapes off and then recurs the next morning as well as some halitosis and occasional difficulty swallowing chicken in her throat/proximal esophagus.  Could be thrush, but none seen on exam today.  Could have some her esophagus as well.  We will try nystatin swish and swallow a few times a day for the next 2 weeks or so.  **She is going to call back in about 3 to 4 weeks with an update on her symptoms.  CC:  [name], DO

## 2020-09-24 NOTE — Patient Instructions (Signed)
If you are age 80 or older, your body mass index should be between 23-30. Your Body mass index is 26.89 kg/m. If this is out of the aforementioned range listed, please consider follow up with your Primary Care Provider.  If you are age 24 or younger, your body mass index should be between 19-25. Your Body mass index is 26.89 kg/m. If this is out of the aformentioned range listed, please consider follow up with your Primary Care Provider.   We have sent the following medications to your pharmacy for you to pick up at your convenience: Nystatin mouth wash 5 times daily for 2 weeks. Budesonide  Call back in 3-4 weeks with an update ask for [employer].  Thank you for choosing me and [facility].  Cleon Dew, PA-C

## 2020-09-29 ENCOUNTER — Telehealth: Payer: Self-pay | Admitting: Gastroenterology

## 2020-09-29 NOTE — Telephone Encounter (Signed)
Inbound call from patient requesting a call back please.  Has questions about whether she should go ahead and have colonoscopy instead of taking budesonide medication.  Please advise.

## 2020-09-29 NOTE — Telephone Encounter (Signed)
The pt actually had questions on how to take her budesonide.  We discussed Kimberly Suarez's recommendations in the last office note and the pt verbalized understanding. I again asked her to call back in 4 weeks to update on how she is doing.  She agrees to call and will call if she has any further concerns

## 2020-10-14 ENCOUNTER — Ambulatory Visit (INDEPENDENT_AMBULATORY_CARE_PROVIDER_SITE_OTHER): Payer: Medicare Other | Admitting: *Deleted

## 2020-10-14 DIAGNOSIS — Z0000 Encounter for general adult medical examination without abnormal findings: Secondary | ICD-10-CM

## 2020-10-14 NOTE — Progress Notes (Signed)
MEDICARE ANNUAL WELLNESS VISIT  10/14/2020  Telephone Visit Disclaimer This Medicare AWV was conducted by telephone due to national recommendations for restrictions regarding the COVID-19 Pandemic (e.g. social distancing).  I verified, using two identifiers, that I am speaking with Kimberly Suarez or their authorized healthcare agent. I discussed the limitations, risks, security, and privacy concerns of performing an evaluation and management service by telephone and the potential availability of an in-person appointment in the future. The patient expressed understanding and agreed to proceed.  Location of Patient: Home Location of Provider (nurse):  [facility]  Subjective:    Kimberly Suarez is a 80 y.o. female patient of [name], DO who had a Medicare Annual Wellness Visit today via telephone. Kimberly Suarez is Retired and lives with their spouse. she has 3 children. she reports that she is socially active and does interact with friends/family regularly. she is not physically active and enjoys watching tv and shopping.  Patient Care Team: [name], DO as PCP - General (Family Medicine) Pyrtle, Lajuan Lines, MD as Consulting Physician (Gastroenterology) Warren Danes, PA-C as Physician Assistant (Dermatology) Donato Heinz, MD as Consulting Physician (Nephrology)  Advanced Directives 10/14/2020 10/09/2018 06/01/2016 12/20/2015 06/09/2015 06/01/2014  Does Patient Have a Medical Advance Directive? No No No Yes No No  Type of Advance Directive - - - [place]  Would patient like information on creating a medical advance directive? No - Patient declined No - Patient declined No - patient declined information - No - patient declined information Yes - Educational materials given    Hospital Utilization Over the Past 12 Months: # of hospitalizations or ER visits: 0 # of surgeries: 0  Review of Systems    Patient reports that  her overall health is unchanged compared to last year.  History obtained from chart review and the patient  Patient Reported Readings (BP, Pulse, CBG, Weight, etc) none  Pain Assessment Pain : No/denies pain     Current Medications & Allergies (verified) Allergies as of 10/14/2020   No Known Allergies     Medication List       Accurate as of October 14, 2020  4:49 PM. If you have any questions, ask your nurse or doctor.        STOP taking these medications   nystatin 100000 UNIT/ML suspension Commonly known as: MYCOSTATIN     TAKE these medications   aspirin 81 MG tablet Take 81 mg by mouth daily.   atorvastatin 40 MG tablet Commonly known as: LIPITOR Take 1 tablet (40 mg total) by mouth daily.   b complex vitamins capsule Take 1 capsule by mouth daily.   enalapril 10 MG tablet Commonly known as: VASOTEC TAKE 1 TABLET BY MOUTH  TWICE DAILY   famotidine 20 MG tablet Commonly known as: Pepcid Take 1 tablet (20 mg total) by mouth daily.   Fish Oil 1000 MG Caps Take 1 capsule by mouth daily.   fluticasone 50 MCG/ACT nasal spray Commonly known as: FLONASE Place 1 spray into both nostrils 2 (two) times daily as needed for allergies or rhinitis.   gabapentin 100 MG capsule Commonly known as: NEURONTIN Take 3 capsules (300 mg total) by mouth at bedtime.   GLUCOSAMINE CHONDR COMPLEX PO Take 1 tablet by mouth 2 (two) times daily.   hydrochlorothiazide 25 MG tablet Commonly known as: HYDRODIURIL TAKE 1 TABLET BY MOUTH  DAILY   MULTIVITAMIN ADULT PO Take 1  tablet by mouth daily.   NONFORMULARY OR COMPOUNDED ITEM Budesonide 5 mg capsules Take 2 by mouth daily for 4 weeks, then take 1 5mg  capsule along with 1 2.5mg  capsule for 4 weeks (pt already has 2.5mg  caps)   saccharomyces boulardii 250 MG capsule Commonly known as: FLORASTOR Take 250 mg by mouth 2 (two) times daily.   vitamin B-12 500 MCG tablet Commonly known as: CYANOCOBALAMIN Take 500 mcg by  mouth daily.   vitamin C 1000 MG tablet Take 1,000 mg by mouth daily.   VITAMIN D PO Take by mouth.       History (reviewed): Past Medical History:  Diagnosis Date  . ASCUS (atypical squamous cells of undetermined significance) on Pap smear 2011x2   Negative high-risk HPV, normal Pap smears 2012/2013  . Atypical nevus 05/02/2007   right back-moderate   . Cancer ([place])   . Cataract   . Diverticulosis   . GERD (gastroesophageal reflux disease)   . Hiatal hernia   . Hyperlipemia   . Hypertension   . IBS (irritable bowel syndrome)   . Lymphocytic colitis 2007  . Neuropathy   . SCC (squamous cell carcinoma) 01/19/2006   bridge of nose (CX35FU)  . SCC (squamous cell carcinoma) 03/08/2017   right inner lower shin (CX35FU)  . SCC (squamous cell carcinoma) x 2 02/24/2002   right bridge of nose (Cx35FU),right bridge of nose  . Uterine prolapse    Pessary   Past Surgical History:  Procedure Laterality Date  . BREAST BIOPSY     unsure which breast  . CATARACT EXTRACTION W/ INTRAOCULAR LENS IMPLANT Bilateral    Family History  Problem Relation Age of Onset  . Heart disease Mother   . Diabetes Brother        BKA  . COPD Father   . Asthma Father   . Alzheimer's disease Sister   . Alzheimer's disease Brother   . Multiple sclerosis Sister   . Heart disease Sister   . Diabetes Brother   . Early death Brother        died in war  . Colon cancer Neg Hx   . Kidney disease Neg Hx   . Liver disease Neg Hx    Social History   Socioeconomic History  . Marital status: Married    Spouse name: Not on file  . Number of children: 3   . Years of education: Not on file  . Highest education level: 8th grade  Occupational History  . Occupation: Retired   Tobacco Use  . Smoking status: Never Smoker  . Smokeless tobacco: Never Used  Vaping Use  . Vaping Use: Never used  Substance and Sexual Activity  . Alcohol use: Yes    Alcohol/week: 0.0 standard drinks    Comment: Rare  .  Drug use: No  . Sexual activity: Yes    Birth control/protection: Post-menopausal    Comment: 1st intercourse 53 yo--1 partner  Other Topics Concern  . Not on file  Social History Narrative   Lives at home with husband.   Right-handed.   Caffeine use: one cup caffeine some days.   Social Determinants of Health   Financial Resource Strain: Not on file  Food Insecurity: Not on file  Transportation Needs: Not on file  Physical Activity: Not on file  Stress: Not on file  Social Connections: Not on file    Activities of Daily Living In your present state of health, do you have any difficulty performing the following  activities: 10/14/2020  Hearing? N  Vision? N  Comment Wears glasses and updating prescription  Difficulty concentrating or making decisions? N  Walking or climbing stairs? Y  Comment Hard to go up and down stairs  Dressing or bathing? N  Doing errands, shopping? N  Preparing Food and eating ? N  Using the Toilet? N  In the past six months, have you accidently leaked urine? N  Do you have problems with loss of bowel control? Y  Comment She is currently seeing GI  Managing your Medications? N  Managing your Finances? N  Housekeeping or managing your Housekeeping? N  Some recent data might be hidden    Patient Education/ Literacy How often do you need to have someone help you when you read instructions, pamphlets, or other written materials from your doctor or pharmacy?: 1 - Never What is the last grade level you completed in school?: 8th  Exercise Current Exercise Habits: The patient does not participate in regular exercise at present  Diet Patient reports consuming 2-3 meals a day and 1 snack(s) a day Patient reports that her primary diet is: Regular Patient reports that she does have regular access to food.   Depression Screen PHQ 2/9 Scores 10/14/2020 06/25/2020 06/25/2020 02/04/2020 01/21/2020 11/03/2019 11/04/2018  PHQ - 2 Score 0 2 0 0 2 0 0  PHQ- 9 Score -  7 - - 5 0 -     Fall Risk Fall Risk  10/14/2020 06/25/2020 06/25/2020 02/13/2020 02/04/2020  Falls in the past year? 0 1 0 1 1  Number falls in past yr: - 0 - 0 0  Injury with Fall? - 1 - 1 0  Risk for fall due to : - History of fall(s) - History of fall(s) -  Follow up - Falls evaluation completed - Falls prevention discussed -     Objective:  NAZANIN KINNER seemed alert and oriented and she participated appropriately during our telephone visit.  Blood Pressure Weight BMI  BP Readings from Last 3 Encounters:  09/24/20 120/60  06/25/20 (!) 100/58  03/11/20 135/77   Wt Readings from Last 3 Encounters:  09/24/20 151 lb 12.8 oz (68.9 kg)  06/25/20 151 lb 12.8 oz (68.9 kg)  03/11/20 152 lb 8 oz (69.2 kg)   BMI Readings from Last 1 Encounters:  09/24/20 26.89 kg/m    *Unable to obtain current vital signs, weight, and BMI due to telephone visit type  Hearing/Vision  . Kimberly Suarez did not seem to have difficulty with hearing/understanding during the telephone conversation . Reports that she has had a formal eye exam by an eye care professional within the past year . Reports that she has not had a formal hearing evaluation within the past year *Unable to fully assess hearing and vision during telephone visit type  Cognitive Function: 6CIT Screen 10/14/2020 10/13/2019 10/13/2019  What Year? 0 points - 0 points  What month? 0 points 0 points 0 points  What time? 0 points 0 points 0 points  Count back from 20 0 points 0 points 0 points  Months in reverse 2 points 0 points 0 points  Repeat phrase 0 points 2 points 2 points  Total Score 2 - 2   (Normal:0-7, Significant for Dysfunction: >8)  Normal Cognitive Function Screening: Yes   Immunization & Health Maintenance Record Immunization History  Administered Date(s) Administered  . Fluad Quad(high Dose 65+) 06/26/2019, 06/25/2020  . Influenza, High Dose Seasonal PF 06/28/2015, 06/01/2016, 06/06/2017, 06/04/2018  . Influenza,inj,Quad  PF,6+ Mos 07/02/2013, 05/26/2014  . Moderna Sars-Covid-2 Vaccination 12/23/2019, 01/20/2020, 09/07/2020  . Pneumococcal Conjugate-13 07/02/2013  . Tdap 05/29/2011    Health Maintenance  Topic Date Due  . PNA vac Low Risk Adult (2 of 2 - PPSV23) 07/02/2014  . DEXA SCAN  11/08/2020  . COVID-19 Vaccine (4 - Booster for Moderna series) 03/07/2021  . TETANUS/TDAP  05/28/2021  . INFLUENZA VACCINE  Completed  . Hepatitis C Screening  Completed       Assessment  This is a routine wellness examination for Kimberly Suarez.  Health Maintenance: Due or Overdue Health Maintenance Due  Topic Date Due  . PNA vac Low Risk Adult (2 of 2 - PPSV23) 07/02/2014    Kimberly Suarez does not need a referral for Community Assistance: Care Management:   no Social Work:    no Prescription Assistance:  no Nutrition/Diabetes Education:  no   Plan:  Personalized Goals Goals Addressed   None    Personalized Health Maintenance & Screening Recommendations  Pneumococcal vaccine   Dexa Scan  Lung Cancer Screening Recommended: no (Low Dose CT Chest recommended if Age 15-80 years, 30 pack-year currently smoking OR have quit w/in past 15 years) Hepatitis C Screening recommended: no HIV Screening recommended: no  Advanced Directives: Written information was not prepared per patient's request.  Referrals & Orders No orders of the defined types were placed in this encounter.   Follow-up Plan . Follow-up with [name], DO as planned . We will give you pneumonia vaccine at next appointment . We will also complete Dexa Scan at next appointment . AVS printed and mailed to patient   I have personally reviewed and noted the following in the patient's chart:   . Medical and social history . Use of alcohol, tobacco or illicit drugs  . Current medications and supplements . Functional ability and status . Nutritional status . Physical activity . Advanced directives . List of other  physicians . Hospitalizations, surgeries, and ER visits in previous 12 months . Vitals . Screenings to include cognitive, depression, and falls . Referrals and appointments  In addition, I have reviewed and discussed with Kimberly Suarez certain preventive protocols, quality metrics, and best practice recommendations. A written personalized care plan for preventive services as well as general preventive health recommendations is available and can be mailed to the patient at her request.      Lynnea Ferrier, LPN  [id number]

## 2020-10-22 ENCOUNTER — Telehealth: Payer: Self-pay | Admitting: Internal Medicine

## 2020-10-22 MED ORDER — NONFORMULARY OR COMPOUNDED ITEM: 0 refills | Status: DC

## 2020-10-22 NOTE — Telephone Encounter (Signed)
Per Alonza Bogus, PA-C okay to just refill medication as it appears not enough tablets were originally sent.

## 2020-10-22 NOTE — Telephone Encounter (Signed)
I just saw her last month and gave recommendation to take 10 mg daily for 4 weeks then taper, unless there were not enough quantity sent for the entire course.Marland KitchenMarland Kitchen

## 2020-10-25 ENCOUNTER — Other Ambulatory Visit: Payer: Self-pay

## 2020-10-25 ENCOUNTER — Telehealth: Payer: Self-pay

## 2020-10-25 MED ORDER — NONFORMULARY OR COMPOUNDED ITEM: 0 refills | Status: DC

## 2020-10-25 MED ORDER — FLUCONAZOLE 100 MG PO TABS: 100.0000 mg | ORAL_TABLET | Freq: Every day | ORAL | 0 refills | Status: AC

## 2020-10-25 NOTE — Telephone Encounter (Signed)
The pt stats she still has diarrhea occasionally while on Budesonide. She says it is better but not 100% back to her normal.

## 2020-10-25 NOTE — Telephone Encounter (Signed)
-----   Message from Loralie Champagne, PA-C sent at 10/25/2020 12:37 PM EST ----- I believe that I just gave her mouthwash for possible thrush.  Let us treat her with fluconazole 100 mg daily for 10 days.  If this does not help then she needs to see her PCP.  Thank you,  Jess

## 2020-10-25 NOTE — Telephone Encounter (Signed)
Sorry [name] I closed the note by accident.  The pt has only 1 dose of budesonide left. She wants to know if she should continue.  She also says she was treated for thrush but she still has the coating on the tongue.  Please advise.

## 2020-10-25 NOTE — Telephone Encounter (Signed)
It looks like.I sent some new prescriptions in on Friday.  I would like her to do 10 mg daily for a total of 8 weeks, which should be another month/4 weeks from now.  Then she needs to taper down to 7.5 mg for 2 weeks, then 5 mg daily for 2 weeks then 2.5 mg daily for 2 weeks.

## 2020-10-25 NOTE — Telephone Encounter (Signed)
-----   Message from Loralie Champagne, PA-C sent at 10/25/2020 12:37 PM EST ----- I believe that I just gave her mouthwash for possible thrush.  Let us treat her with fluconazole 100 mg daily for 10 days.  If this does not help then she needs to see her PCP.   Thank you,   Jess

## 2020-10-25 NOTE — Telephone Encounter (Signed)
Prescriptions sent to the pharmacy for budesonide and fluconazole.  The pt has been advised.  She will call with any further concerns.

## 2020-10-25 NOTE — Telephone Encounter (Signed)
I spoke with the pt and the pharmacy about an hour ago and sent in a script for budesonide and fluconazole.  I called again and nothing further needed.

## 2020-10-25 NOTE — Telephone Encounter (Signed)
Kimberly Suarez from [facility] is requesting a call back from a nurse to discuss how she needs the pt's budesonide to be ordered, caller states she is needing two different scripts.  CB [phone]

## 2020-10-25 NOTE — Telephone Encounter (Signed)
Pt is requesting a call back from a nurse to provide an update on her budesonide.

## 2020-10-26 ENCOUNTER — Ambulatory Visit: Payer: Medicare Other | Admitting: Physician Assistant

## 2020-10-26 ENCOUNTER — Encounter: Payer: Self-pay | Admitting: Physician Assistant

## 2020-10-26 ENCOUNTER — Other Ambulatory Visit: Payer: Self-pay

## 2020-10-26 DIAGNOSIS — Z1283 Encounter for screening for malignant neoplasm of skin: Secondary | ICD-10-CM | POA: Diagnosis not present

## 2020-10-26 DIAGNOSIS — L821 Other seborrheic keratosis: Secondary | ICD-10-CM

## 2020-10-26 DIAGNOSIS — D485 Neoplasm of uncertain behavior of skin: Secondary | ICD-10-CM

## 2020-10-26 DIAGNOSIS — D1800 Hemangioma unspecified site: Secondary | ICD-10-CM

## 2020-10-26 DIAGNOSIS — L820 Inflamed seborrheic keratosis: Secondary | ICD-10-CM | POA: Diagnosis not present

## 2020-10-26 NOTE — Progress Notes (Signed)
Patient is seeing kidney specialist for stage 3 kindly diease.

## 2020-11-03 ENCOUNTER — Encounter: Payer: Self-pay | Admitting: Physician Assistant

## 2020-11-15 NOTE — Progress Notes (Signed)
Follow-Up Visit   Subjective  Kimberly Suarez is a 80 y.o. female who presents for the following: Annual Exam (Full body skin check. Dark spot rough spot under right eye, dry, patchy x 6 months. Per patient has several dark spots, she wanted to discuss with [facility]).   The following portions of the chart were reviewed this encounter and updated as appropriate:  Tobacco  Allergies  Meds  Problems  Med Hx  Surg Hx  Fam Hx      Objective  Well appearing patient in no apparent distress; mood and affect are within normal limits.  A full examination was performed including scalp, head, eyes, ears, nose, lips, neck, chest, axillae, abdomen, back, buttocks, bilateral upper extremities, bilateral lower extremities, hands, feet, fingers, toes, fingernails, and toenails. All findings within normal limits unless otherwise noted below.  Objective  Left Abdomen (side) - Upper: No atypical nevi No signs of non-mole skin cancer. Full body skin examination  Objective  Right Malar Cheek: Pink pearly papule     Objective  Right Thigh - Anterior: Dark raised crust     Objective  Right Breast: Multiply Stuck-on, waxy, tan-brown papules and plaques. --Discussed benign etiology and prognosis.   Objective  Left Breast: Multiple red raised papule  Objective  Right Lower Back (10): Erythematous stuck-on, waxy papule or plaque.    Assessment & Plan  Encounter for screening for malignant neoplasm of skin Left Abdomen (side) - Upper  Yearly skin check  Neoplasm of uncertain behavior of skin (2) Right Malar Cheek  Skin / nail biopsy Type of biopsy: tangential   Informed consent: discussed and consent obtained   Timeout: patient name, date of birth, surgical site, and procedure verified   Procedure prep:  Patient was prepped and draped in usual sterile fashion (Non sterile) Prep type:  Chlorhexidine Anesthesia: the lesion was anesthetized in a standard fashion   Anesthetic:   1% lidocaine w/ epinephrine 1-100,000 local infiltration Instrument used: flexible razor blade   Outcome: patient tolerated procedure well   Post-procedure details: wound care instructions given    Specimen 1 - Surgical pathology Differential Diagnosis: bcc vs scc  Check Margins: No  Right Thigh - Anterior  Skin / nail biopsy Type of biopsy: tangential   Informed consent: discussed and consent obtained   Timeout: patient name, date of birth, surgical site, and procedure verified   Procedure prep:  Patient was prepped and draped in usual sterile fashion (Non sterile) Prep type:  Chlorhexidine Anesthesia: the lesion was anesthetized in a standard fashion   Anesthetic:  1% lidocaine w/ epinephrine 1-100,000 local infiltration Instrument used: flexible razor blade   Outcome: patient tolerated procedure well   Post-procedure details: wound care instructions given    Specimen 2 - Surgical pathology Differential Diagnosis: r/o sk  Check Margins: No  Seborrheic keratosis Right Breast  Okay to leave if stable  Hemangioma, unspecified site Left Breast  Okay to leave if stable  Seborrheic keratosis, inflamed (10) Right Lower Back  Destruction of lesion - Right Lower Back Complexity: simple   Destruction method: cryotherapy   Informed consent: discussed and consent obtained   Timeout:  patient name, date of birth, surgical site, and procedure verified Lesion destroyed using liquid nitrogen: Yes   Cryotherapy cycles:  3 Outcome: patient tolerated procedure well with no complications      I, Mc Hollen, PA-C, have reviewed all documentation's for this visit.  The documentation on 11/15/20 for the exam, diagnosis, procedures and orders  are all accurate and complete.

## 2020-11-22 DIAGNOSIS — K219 Gastro-esophageal reflux disease without esophagitis: Secondary | ICD-10-CM | POA: Diagnosis not present

## 2020-11-22 DIAGNOSIS — I129 Hypertensive chronic kidney disease with stage 1 through stage 4 chronic kidney disease, or unspecified chronic kidney disease: Secondary | ICD-10-CM | POA: Diagnosis not present

## 2020-11-22 DIAGNOSIS — M1990 Unspecified osteoarthritis, unspecified site: Secondary | ICD-10-CM | POA: Diagnosis not present

## 2020-11-22 DIAGNOSIS — N1831 Chronic kidney disease, stage 3a: Secondary | ICD-10-CM | POA: Diagnosis not present

## 2020-11-22 DIAGNOSIS — R7303 Prediabetes: Secondary | ICD-10-CM | POA: Diagnosis not present

## 2020-11-22 DIAGNOSIS — E785 Hyperlipidemia, unspecified: Secondary | ICD-10-CM | POA: Diagnosis not present

## 2020-11-22 DIAGNOSIS — D472 Monoclonal gammopathy: Secondary | ICD-10-CM | POA: Diagnosis not present

## 2020-11-22 DIAGNOSIS — N1832 Chronic kidney disease, stage 3b: Secondary | ICD-10-CM | POA: Diagnosis not present

## 2020-11-25 NOTE — Telephone Encounter (Signed)
Pt is wanting to know if she should continue taking her budesonide.

## 2020-11-25 NOTE — Telephone Encounter (Signed)
Called patient and explain the budesonide taper and informed her that after taper she could discontinue budesonide if symptoms were gone. Patient stated she understood.

## 2020-12-05 ENCOUNTER — Other Ambulatory Visit: Payer: Self-pay | Admitting: Family Medicine

## 2020-12-05 DIAGNOSIS — K222 Esophageal obstruction: Secondary | ICD-10-CM

## 2020-12-05 DIAGNOSIS — K219 Gastro-esophageal reflux disease without esophagitis: Secondary | ICD-10-CM

## 2020-12-09 ENCOUNTER — Other Ambulatory Visit: Payer: Self-pay | Admitting: Family Medicine

## 2020-12-24 ENCOUNTER — Encounter: Payer: Self-pay | Admitting: Family Medicine

## 2020-12-24 ENCOUNTER — Other Ambulatory Visit: Payer: Self-pay

## 2020-12-24 ENCOUNTER — Ambulatory Visit (INDEPENDENT_AMBULATORY_CARE_PROVIDER_SITE_OTHER): Payer: Medicare Other | Admitting: Family Medicine

## 2020-12-24 ENCOUNTER — Ambulatory Visit (INDEPENDENT_AMBULATORY_CARE_PROVIDER_SITE_OTHER): Payer: Medicare Other

## 2020-12-24 VITALS — BP 134/76 | HR 65 | Temp 97.0°F | Ht 63.0 in | Wt 150.2 lb

## 2020-12-24 DIAGNOSIS — Z780 Asymptomatic menopausal state: Secondary | ICD-10-CM

## 2020-12-24 DIAGNOSIS — F4320 Adjustment disorder, unspecified: Secondary | ICD-10-CM

## 2020-12-24 DIAGNOSIS — G629 Polyneuropathy, unspecified: Secondary | ICD-10-CM

## 2020-12-24 DIAGNOSIS — R252 Cramp and spasm: Secondary | ICD-10-CM | POA: Diagnosis not present

## 2020-12-24 DIAGNOSIS — R7309 Other abnormal glucose: Secondary | ICD-10-CM | POA: Diagnosis not present

## 2020-12-24 DIAGNOSIS — F4321 Adjustment disorder with depressed mood: Secondary | ICD-10-CM | POA: Diagnosis not present

## 2020-12-24 DIAGNOSIS — Z13820 Encounter for screening for osteoporosis: Secondary | ICD-10-CM | POA: Diagnosis not present

## 2020-12-24 DIAGNOSIS — N1832 Chronic kidney disease, stage 3b: Secondary | ICD-10-CM

## 2020-12-24 DIAGNOSIS — K219 Gastro-esophageal reflux disease without esophagitis: Secondary | ICD-10-CM | POA: Diagnosis not present

## 2020-12-24 DIAGNOSIS — K222 Esophageal obstruction: Secondary | ICD-10-CM | POA: Diagnosis not present

## 2020-12-24 DIAGNOSIS — Z1211 Encounter for screening for malignant neoplasm of colon: Secondary | ICD-10-CM | POA: Diagnosis not present

## 2020-12-24 LAB — BAYER DCA HB A1C WAIVED: HB A1C (BAYER DCA - WAIVED): 6.2 %

## 2020-12-24 MED ORDER — HYDROCHLOROTHIAZIDE 25 MG PO TABS: 1.0000 | ORAL_TABLET | Freq: Every day | ORAL | 3 refills | Status: DC

## 2020-12-24 MED ORDER — ENALAPRIL MALEATE 10 MG PO TABS: 10.0000 mg | ORAL_TABLET | Freq: Two times a day (BID) | ORAL | 3 refills | Status: DC

## 2020-12-24 MED ORDER — ALPHA-LIPOIC ACID 600 MG PO CAPS: 1.0000 | ORAL_CAPSULE | Freq: Every day | ORAL | 3 refills | Status: DC

## 2020-12-24 MED ORDER — ATORVASTATIN CALCIUM 40 MG PO TABS: 40.0000 mg | ORAL_TABLET | Freq: Every day | ORAL | 3 refills | Status: DC

## 2020-12-24 MED ORDER — FAMOTIDINE 20 MG PO TABS: 20.0000 mg | ORAL_TABLET | Freq: Every day | ORAL | 3 refills | Status: DC

## 2020-12-24 NOTE — Progress Notes (Signed)
Subjective: CC: 66-monthfollow-up PCP: GJanora Norlander DO HWGN:FAOZHW WPriceris a 80y.o. female presenting to clinic today for:  1.  Balance issues Patient with ongoing balance issues.  She reports ongoing knee pain and she started having some cramps in her legs about 4 months ago.  She has a history of neuropathy and is compliant with the B complex.  She is also on gabapentin 300 mg nightly.  2.  Grief Patient has been grieving the loss of her husband.  They were married for many decades and there is a palpable absence.  She feels well supported by her family.   ROS: Per HPI  No Known Allergies Past Medical History:  Diagnosis Date  . ASCUS (atypical squamous cells of undetermined significance) on Pap smear 2011x2   Negative high-risk HPV, normal Pap smears 2012/2013  . Atypical nevus 05/02/2007   right back-moderate   . [place]   . Cataract   . Diverticulosis   . GERD (gastroesophageal reflux disease)   . Hiatal hernia   . Hyperlipemia   . Hypertension   . IBS (irritable bowel syndrome)   . Kidney disease   . Lymphocytic colitis 2007  . Neuropathy   . SCC (squamous cell carcinoma) 01/19/2006   bridge of nose (CX35FU)  . SCC (squamous cell carcinoma) 03/08/2017   right inner lower shin (CX35FU)  . SCC (squamous cell carcinoma) x 2 02/24/2002   right bridge of nose (Cx35FU),right bridge of nose  . Uterine prolapse    Pessary    Current Outpatient Medications:  .  Ascorbic Acid (VITAMIN C) 1000 MG tablet, Take 1,000 mg by mouth daily., Disp: , Rfl:  .  aspirin 81 MG tablet, Take 81 mg by mouth daily., Disp: , Rfl:  .  atorvastatin (LIPITOR) 40 MG tablet, Take 1 tablet (40 mg total) by mouth daily., Disp: 90 tablet, Rfl: 3 .  b complex vitamins capsule, Take 1 capsule by mouth daily., Disp: , Rfl:  .  enalapril (VASOTEC) 10 MG tablet, Take 1 tablet (10 mg total) by mouth 2 (two) times daily. (NEEDS TO BE SEEN BEFORE NEXT REFILL), Disp: 180 tablet, Rfl:  0 .  famotidine (PEPCID) 20 MG tablet, Take 1 tablet (20 mg total) by mouth daily. (NEEDS TO BE SEEN BEFORE NEXT REFILL), Disp: 90 tablet, Rfl: 0 .  fluticasone (FLONASE) 50 MCG/ACT nasal spray, Place 1 spray into both nostrils 2 (two) times daily as needed for allergies or rhinitis., Disp: 16 g, Rfl: 6 .  gabapentin (NEURONTIN) 100 MG capsule, Take 3 capsules (300 mg total) by mouth at bedtime., Disp: 270 capsule, Rfl: 4 .  Glucosamine-Chondroitin (GLUCOSAMINE CHONDR COMPLEX PO), Take 1 tablet by mouth 2 (two) times daily., Disp: , Rfl:  .  hydrochlorothiazide (HYDRODIURIL) 25 MG tablet, Take 1 tablet (25 mg total) by mouth daily. (NEEDS TO BE SEEN BEFORE NEXT REFILL), Disp: 90 tablet, Rfl: 0 .  Multiple Vitamins-Minerals (MULTIVITAMIN ADULT PO), Take 1 tablet by mouth daily., Disp: , Rfl:  .  NONFORMULARY OR COMPOUNDED ITEM, Budesonide 5 mg capsules Take 2 capsules (10 mg) by mouth daily for 8 weeks, then take 1.5 capsules (7.5 mg) capsule for 2 weeks, then 1 capsule (5 mg)  for 2 weeks, then 1/2 capsule (2.5 mg) daily for 2 weeks., Disp: 170 each, Rfl: 0 .  Omega-3 Fatty Acids (FISH OIL) 1000 MG CAPS, Take 1 capsule by mouth daily., Disp: , Rfl:  .  saccharomyces boulardii (FLORASTOR) 250 MG  capsule, Take 250 mg by mouth 2 (two) times daily., Disp: , Rfl:  .  vitamin B-12 (CYANOCOBALAMIN) 500 MCG tablet, Take 500 mcg by mouth daily., Disp: , Rfl:  .  VITAMIN D PO, Take by mouth., Disp: , Rfl:  Social History   Socioeconomic History  . Marital status: Married    Spouse name: Not on file  . Number of children: 3   . Years of education: Not on file  . Highest education level: 8th grade  Occupational History  . Occupation: Retired   Tobacco Use  . Smoking status: Never Smoker  . Smokeless tobacco: Never Used  Vaping Use  . Vaping Use: Never used  Substance and Sexual Activity  . Alcohol use: Yes    Alcohol/week: 0.0 standard drinks    Comment: Rare  . Drug use: No  . Sexual activity:  Yes    Birth control/protection: Post-menopausal    Comment: 1st intercourse 69 yo--1 partner  Other Topics Concern  . Not on file  Social History Narrative   Lives at home with husband.   Right-handed.   Caffeine use: one cup caffeine some days.   Social Determinants of Health   Financial Resource Strain: Not on file  Food Insecurity: Not on file  Transportation Needs: Not on file  Physical Activity: Not on file  Stress: Not on file  Social Connections: Not on file  Intimate Partner Violence: Not on file   Family History  Problem Relation Age of Onset  . Heart disease Mother   . Diabetes Brother        BKA  . COPD Father   . Asthma Father   . Alzheimer's disease Sister   . Alzheimer's disease Brother   . Multiple sclerosis Sister   . Heart disease Sister   . Diabetes Brother   . Early death Brother        died in war  . Colon cancer Neg Hx   . Kidney disease Neg Hx   . Liver disease Neg Hx     Objective: Office vital signs reviewed. BP 134/76   Pulse 65   Temp (!) 97 F (36.1 C)   Ht 5' 3" (1.6 m)   Wt 150 lb 3.2 oz (68.1 kg)   SpO2 96%   BMI 26.61 kg/m   Physical Examination:  General: Awake, alert, well nourished, No acute distress HEENT: Normal sclera white.  Moist mucous membranes Cardio: regular rate and rhythm, S1S2 heard, no murmurs appreciated Pulm: clear to auscultation bilaterally, no wheezes, rhonchi or rales; normal work of breathing on room air Extremities: warm, well perfused, No edema, cyanosis or clubbing; +2 pulses bilaterally MSK: Ambulating independently Psych: Somewhat depressed when talking about her spouse  Assessment/ Plan: 80 y.o. female   Neuropathy - Plan: Alpha-Lipoic Acid 600 MG CAPS  Leg cramp - Plan: Magnesium  Stage 3b chronic kidney disease ([place]) - Plan: CBC, VITAMIN D 25 Hydroxy (Vit-D Deficiency, Fractures), CMP14+EGFR, CANCELED: Renal Function Panel  Elevated hemoglobin A1c - Plan: Bayer DCA Hb A1c  Waived  Grief reaction  GERD with stricture - Plan: famotidine (PEPCID) 20 MG tablet  Screen for colon cancer - Plan: Fecal occult blood, imunochemical, Fecal occult blood, imunochemical  Check renal function, CBC, vitamin D  A1c was normal  Continues to grieve after the loss of her husband.  I encouraged her to follow-up with me if she decides she wants to start any medications  Try alpha lipoic acid 600 mg daily for  neuropathy  Pepcid renewed  Check magnesium level given leg cramping, CMP as above  FOBT ordered  No orders of the defined types were placed in this encounter.  No orders of the defined types were placed in this encounter.    [name], DO [facility] [phone]

## 2020-12-25 LAB — CMP14+EGFR
ALT: 23 [IU]/L (ref 0–32)
AST: 25 [IU]/L (ref 0–40)
Albumin/Globulin Ratio: 1.6 (ref 1.2–2.2)
Albumin: 4.2 g/dL (ref 3.7–4.7)
Alkaline Phosphatase: 43 [IU]/L — ABNORMAL LOW (ref 44–121)
BUN/Creatinine Ratio: 20 (ref 12–28)
BUN: 29 mg/dL — ABNORMAL HIGH (ref 8–27)
Bilirubin Total: 0.2 mg/dL (ref 0.0–1.2)
CO2: 25 mmol/L (ref 20–29)
Calcium: 9.6 mg/dL (ref 8.7–10.3)
Chloride: 98 mmol/L (ref 96–106)
Creatinine, Ser: 1.48 mg/dL — ABNORMAL HIGH (ref 0.57–1.00)
Globulin, Total: 2.7 g/dL (ref 1.5–4.5)
Glucose: 101 mg/dL — ABNORMAL HIGH (ref 65–99)
Potassium: 4.1 mmol/L (ref 3.5–5.2)
Sodium: 140 mmol/L (ref 134–144)
Total Protein: 6.9 g/dL (ref 6.0–8.5)
eGFR: 36 mL/min/{1.73_m2} — ABNORMAL LOW

## 2020-12-25 LAB — CBC
Hematocrit: 38.1 % (ref 34.0–46.6)
Hemoglobin: 12.4 g/dL (ref 11.1–15.9)
MCH: 29.9 pg (ref 26.6–33.0)
MCHC: 32.5 g/dL (ref 31.5–35.7)
MCV: 92 fL (ref 79–97)
Platelets: 230 10*3/uL (ref 150–450)
RBC: 4.15 x10E6/uL (ref 3.77–5.28)
RDW: 13 % (ref 11.7–15.4)
WBC: 5.6 10*3/uL (ref 3.4–10.8)

## 2020-12-25 LAB — VITAMIN D 25 HYDROXY (VIT D DEFICIENCY, FRACTURES): Vit D, 25-Hydroxy: 57.7 ng/mL (ref 30.0–100.0)

## 2020-12-25 LAB — RENAL FUNCTION PANEL: Phosphorus: 4 mg/dL (ref 3.0–4.3)

## 2020-12-25 LAB — MAGNESIUM: Magnesium: 1.8 mg/dL (ref 1.6–2.3)

## 2020-12-25 LAB — FECAL OCCULT BLOOD, IMMUNOCHEMICAL: Fecal Occult Bld: NEGATIVE

## 2021-01-06 ENCOUNTER — Telehealth: Payer: Self-pay

## 2021-01-06 NOTE — Telephone Encounter (Signed)
Printed dexa off and called pt

## 2021-01-10 ENCOUNTER — Telehealth: Payer: Self-pay | Admitting: Gastroenterology

## 2021-01-10 NOTE — Telephone Encounter (Signed)
The pt has had a return of diarrhea after stopping the budesonide.  She has about 2-3 episodes of very loose stools.  Please advise

## 2021-01-10 NOTE — Telephone Encounter (Signed)
Pt is still with diarrhea and would like some advice. Please give pt a call. Thank you

## 2021-01-11 DIAGNOSIS — M1711 Unilateral primary osteoarthritis, right knee: Secondary | ICD-10-CM | POA: Diagnosis not present

## 2021-01-11 DIAGNOSIS — M25561 Pain in right knee: Secondary | ICD-10-CM | POA: Diagnosis not present

## 2021-01-11 DIAGNOSIS — M25562 Pain in left knee: Secondary | ICD-10-CM | POA: Diagnosis not present

## 2021-01-11 NOTE — Telephone Encounter (Signed)
Left message on machine to call back  

## 2021-01-12 NOTE — Telephone Encounter (Signed)
The pt states she is doing much better at this time with only one BM daily.  She will call back if she decides to proceed with another taper.

## 2021-01-25 ENCOUNTER — Other Ambulatory Visit: Payer: Self-pay

## 2021-01-25 ENCOUNTER — Ambulatory Visit (INDEPENDENT_AMBULATORY_CARE_PROVIDER_SITE_OTHER): Payer: Medicare Other | Admitting: *Deleted

## 2021-01-25 DIAGNOSIS — Z23 Encounter for immunization: Secondary | ICD-10-CM | POA: Diagnosis not present

## 2021-01-25 NOTE — Progress Notes (Signed)
Pt given Pneumovax23 IM right deltoid and tolerated well.

## 2021-03-01 ENCOUNTER — Other Ambulatory Visit: Payer: Self-pay | Admitting: Family Medicine

## 2021-03-02 ENCOUNTER — Other Ambulatory Visit: Payer: Self-pay | Admitting: Family Medicine

## 2021-04-05 DIAGNOSIS — M1711 Unilateral primary osteoarthritis, right knee: Secondary | ICD-10-CM | POA: Diagnosis not present

## 2021-04-17 ENCOUNTER — Other Ambulatory Visit: Payer: Self-pay | Admitting: Family Medicine

## 2021-04-18 NOTE — Telephone Encounter (Signed)
[facility]. NTBS in Oct for 6 mos ckup. Mail sent

## 2021-05-05 DIAGNOSIS — N1831 Chronic kidney disease, stage 3a: Secondary | ICD-10-CM | POA: Diagnosis not present

## 2021-05-05 DIAGNOSIS — N189 Chronic kidney disease, unspecified: Secondary | ICD-10-CM | POA: Diagnosis not present

## 2021-05-05 DIAGNOSIS — N390 Urinary tract infection, site not specified: Secondary | ICD-10-CM | POA: Diagnosis not present

## 2021-05-09 DIAGNOSIS — M1990 Unspecified osteoarthritis, unspecified site: Secondary | ICD-10-CM | POA: Diagnosis not present

## 2021-05-09 DIAGNOSIS — N1831 Chronic kidney disease, stage 3a: Secondary | ICD-10-CM | POA: Diagnosis not present

## 2021-05-09 DIAGNOSIS — R7303 Prediabetes: Secondary | ICD-10-CM | POA: Diagnosis not present

## 2021-05-09 DIAGNOSIS — D472 Monoclonal gammopathy: Secondary | ICD-10-CM | POA: Diagnosis not present

## 2021-05-09 DIAGNOSIS — E785 Hyperlipidemia, unspecified: Secondary | ICD-10-CM | POA: Diagnosis not present

## 2021-05-09 DIAGNOSIS — I129 Hypertensive chronic kidney disease with stage 1 through stage 4 chronic kidney disease, or unspecified chronic kidney disease: Secondary | ICD-10-CM | POA: Diagnosis not present

## 2021-05-09 DIAGNOSIS — K219 Gastro-esophageal reflux disease without esophagitis: Secondary | ICD-10-CM | POA: Diagnosis not present

## 2021-05-10 ENCOUNTER — Telehealth: Payer: Self-pay | Admitting: Family Medicine

## 2021-05-11 ENCOUNTER — Other Ambulatory Visit: Payer: Self-pay

## 2021-05-11 ENCOUNTER — Ambulatory Visit (INDEPENDENT_AMBULATORY_CARE_PROVIDER_SITE_OTHER): Payer: Medicare Other | Admitting: Family Medicine

## 2021-05-11 ENCOUNTER — Encounter: Payer: Self-pay | Admitting: Family Medicine

## 2021-05-11 VITALS — BP 126/78 | HR 74 | Ht 63.0 in | Wt 151.0 lb

## 2021-05-11 DIAGNOSIS — K219 Gastro-esophageal reflux disease without esophagitis: Secondary | ICD-10-CM

## 2021-05-11 DIAGNOSIS — K222 Esophageal obstruction: Secondary | ICD-10-CM | POA: Diagnosis not present

## 2021-05-11 DIAGNOSIS — K52839 Microscopic colitis, unspecified: Secondary | ICD-10-CM

## 2021-05-11 MED ORDER — FAMOTIDINE 20 MG PO TABS: 20.0000 mg | ORAL_TABLET | Freq: Every day | ORAL | 3 refills | Status: DC

## 2021-05-11 MED ORDER — DEXAMETHASONE 2 MG PO TABS: ORAL_TABLET | ORAL | 0 refills | Status: DC

## 2021-05-11 NOTE — Progress Notes (Signed)
BP 126/78   Pulse 74   Ht '5\' 3"'$  (1.6 m)   Wt 151 lb (68.5 kg)   SpO2 97%   BMI 26.75 kg/m    Subjective:   Patient ID: Kimberly Suarez, female    DOB: 1941/08/04, 80 y.o.   MRN: [id number]  HPI: Kimberly Suarez is a 80 y.o. female presenting on 05/11/2021 for Diarrhea (H/O Colitis)   HPI Patient has seen gastroenterology in the past and been diagnosed with microscopic colitis for which they treat her with oral budesonide taper and she gets better.  She says she started flaring up over the past couple days again.  She says she has had this over the past couple years frequently.  She did take couple Imodium yesterday and her diarrhea has cleared up but she still having the rolling feeling in her stomach and a lot of gassiness and bloating.  She has been having bowel frequency and sometimes urgency as well.  She says her stools have been loose but not watery and denies any blood or fevers or chills outside of her normal.  Relevant past medical, surgical, family and social history reviewed and updated as indicated. Interim medical history since our last visit reviewed. Allergies and medications reviewed and updated.  Review of Systems  Constitutional:  Negative for chills and fever.  Respiratory:  Negative for chest tightness and shortness of breath.   Cardiovascular:  Negative for chest pain and leg swelling.  Gastrointestinal:  Positive for abdominal pain and diarrhea. Negative for abdominal distention, anal bleeding, blood in stool, constipation, nausea and vomiting.  Genitourinary:  Negative for dysuria, frequency and urgency.  Musculoskeletal:  Negative for back pain and gait problem.  Skin:  Negative for rash.  Neurological:  Negative for light-headedness and headaches.  Psychiatric/Behavioral:  Negative for agitation and behavioral problems.   All other systems reviewed and are negative.  Per HPI unless specifically indicated above   Allergies as of 05/11/2021   No Known  Allergies      Medication List        Accurate as of May 11, 2021 11:32 AM. If you have any questions, ask your nurse or doctor.          STOP taking these medications    b complex vitamins capsule Stopped by: Fransisca Kaufmann Kileen Lange, MD       TAKE these medications    Alpha-Lipoic Acid 600 MG Caps Take by mouth. What changed: Another medication with the same name was removed. Continue taking this medication, and follow the directions you see here. Changed by: Fransisca Kaufmann Susana Duell, MD   aspirin 81 MG tablet Take 81 mg by mouth daily.   atorvastatin 40 MG tablet Commonly known as: LIPITOR Take 1 tablet (40 mg total) by mouth daily.   dexamethasone 2 MG tablet Commonly known as: DECADRON Take 4 tablets for 3 days then 3 tablets for 3 days then 2 tablets for 3 days then 1 tablet for 3 days Started by: Fransisca Kaufmann Jaeda Bruso, MD   enalapril 10 MG tablet Commonly known as: VASOTEC Take 1 tablet (10 mg total) by mouth 2 (two) times daily.   famotidine 20 MG tablet Commonly known as: PEPCID Take 1 tablet (20 mg total) by mouth daily.   Fish Oil 1000 MG Caps Take 1 capsule by mouth daily.   fluticasone 50 MCG/ACT nasal spray Commonly known as: FLONASE Place 1 spray into both nostrils 2 (two) times daily as needed for allergies or  rhinitis.   gabapentin 100 MG capsule Commonly known as: NEURONTIN Take 3 capsules (300 mg total) by mouth at bedtime.   GLUCOSAMINE CHONDR COMPLEX PO Take 1 tablet by mouth 2 (two) times daily.   hydrochlorothiazide 25 MG tablet Commonly known as: HYDRODIURIL TAKE 1 TABLET BY MOUTH  DAILY   MULTIVITAMIN ADULT PO Take 1 tablet by mouth daily.   NONFORMULARY OR COMPOUNDED ITEM Budesonide 5 mg capsules Take 2 capsules (10 mg) by mouth daily for 8 weeks, then take 1.5 capsules (7.5 mg) capsule for 2 weeks, then 1 capsule (5 mg)  for 2 weeks, then 1/2 capsule (2.5 mg) daily for 2 weeks.   saccharomyces boulardii 250 MG  capsule Commonly known as: FLORASTOR Take 250 mg by mouth 2 (two) times daily. What changed: Another medication with the same name was removed. Continue taking this medication, and follow the directions you see here. Changed by: Fransisca Kaufmann Annalina Needles, MD   vitamin B-12 500 MCG tablet Commonly known as: CYANOCOBALAMIN Take 500 mcg by mouth daily.   vitamin B-12 1000 MCG tablet Commonly known as: CYANOCOBALAMIN Take 1,000 mcg by mouth daily.   VITAMIN D PO Take by mouth.         Objective:   BP 126/78   Pulse 74   Ht '5\' 3"'$  (1.6 m)   Wt 151 lb (68.5 kg)   SpO2 97%   BMI 26.75 kg/m   Wt Readings from Last 3 Encounters:  05/11/21 151 lb (68.5 kg)  12/24/20 150 lb 3.2 oz (68.1 kg)  09/24/20 151 lb 12.8 oz (68.9 kg)    Physical Exam Vitals and nursing note reviewed.  Constitutional:      General: She is not in acute distress.    Appearance: She is well-developed. She is not diaphoretic.  Eyes:     Conjunctiva/sclera: Conjunctivae normal.  Abdominal:     General: Abdomen is flat. Bowel sounds are normal. There is no distension.     Palpations: Abdomen is soft.     Tenderness: There is no abdominal tenderness. There is no right CVA tenderness, left CVA tenderness, guarding or rebound. Negative signs include Murphy's sign.     Hernia: No hernia is present.  Skin:    General: Skin is warm and dry.     Findings: No rash.  Neurological:     Mental Status: She is alert and oriented to person, place, and time.     Coordination: Coordination normal.  Psychiatric:        Behavior: Behavior normal.      Assessment & Plan:   Problem List Items Addressed This Visit       Digestive   GERD with stricture   Relevant Medications   famotidine (PEPCID) 20 MG tablet   saccharomyces boulardii (FLORASTOR) 250 MG capsule   Microscopic colitis - Primary   Relevant Medications   dexamethasone (DECADRON) 2 MG tablet  Sent dexamethasone for microscopic colitis, continue other  medicine as is.  Follow up plan: Return if symptoms worsen or fail to improve.  Counseling provided for all of the vaccine components No orders of the defined types were placed in this encounter.   Caryl Pina, MD [facility] 05/11/2021, 11:32 AM

## 2021-05-18 ENCOUNTER — Ambulatory Visit (INDEPENDENT_AMBULATORY_CARE_PROVIDER_SITE_OTHER): Payer: Medicare Other | Admitting: Family Medicine

## 2021-05-18 ENCOUNTER — Encounter: Payer: Self-pay | Admitting: Family Medicine

## 2021-05-18 DIAGNOSIS — U071 COVID-19: Secondary | ICD-10-CM | POA: Diagnosis not present

## 2021-05-18 MED ORDER — MOLNUPIRAVIR EUA 200MG CAPSULE: 4.0000 | ORAL_CAPSULE | Freq: Two times a day (BID) | ORAL | 0 refills | Status: AC

## 2021-05-18 NOTE — Progress Notes (Signed)
Virtual Visit via telephone Note  I connected with Kimberly Suarez on 05/18/21 at 1413 by telephone and verified that I am speaking with the correct person using two identifiers. Kimberly Suarez is currently located at home and patient are currently with her during visit. The provider, Fransisca Kaufmann Glema Takaki, MD is located in their office at time of visit.  Call ended at 1418  I discussed the limitations, risks, security and privacy concerns of performing an evaluation and management service by telephone and the availability of in person appointments. I also discussed with the patient that there may be a patient responsible charge related to this service. The patient expressed understanding and agreed to proceed.   History and Present Illness: Patient took a home covid test and it was positive.  Her daughter had covid last week.  She has congestion and lightheadedness and sinus drainage. She denies SOB or wheezing or fevers. Her daughter is mostly better.   1. COVID-19 virus infection     Outpatient Encounter Medications as of 05/18/2021  Medication Sig   molnupiravir EUA (LAGEVRIO) 200 mg CAPS capsule Take 4 capsules (800 mg total) by mouth 2 (two) times daily for 5 days.   Alpha-Lipoic Acid 600 MG CAPS Take by mouth.   aspirin 81 MG tablet Take 81 mg by mouth daily.   atorvastatin (LIPITOR) 40 MG tablet Take 1 tablet (40 mg total) by mouth daily.   dexamethasone (DECADRON) 2 MG tablet Take 4 tablets for 3 days then 3 tablets for 3 days then 2 tablets for 3 days then 1 tablet for 3 days   enalapril (VASOTEC) 10 MG tablet Take 1 tablet (10 mg total) by mouth 2 (two) times daily.   famotidine (PEPCID) 20 MG tablet Take 1 tablet (20 mg total) by mouth daily.   fluticasone (FLONASE) 50 MCG/ACT nasal spray Place 1 spray into both nostrils 2 (two) times daily as needed for allergies or rhinitis.   gabapentin (NEURONTIN) 100 MG capsule Take 3 capsules (300 mg total) by mouth at bedtime.    Glucosamine-Chondroitin (GLUCOSAMINE CHONDR COMPLEX PO) Take 1 tablet by mouth 2 (two) times daily.   hydrochlorothiazide (HYDRODIURIL) 25 MG tablet TAKE 1 TABLET BY MOUTH  DAILY   Multiple Vitamins-Minerals (MULTIVITAMIN ADULT PO) Take 1 tablet by mouth daily.   Omega-3 Fatty Acids (FISH OIL) 1000 MG CAPS Take 1 capsule by mouth daily.   saccharomyces boulardii (FLORASTOR) 250 MG capsule Take 250 mg by mouth 2 (two) times daily.   vitamin B-12 (CYANOCOBALAMIN) 1000 MCG tablet Take 1,000 mcg by mouth daily.   vitamin B-12 (CYANOCOBALAMIN) 500 MCG tablet Take 500 mcg by mouth daily.   VITAMIN D PO Take by mouth.   No facility-administered encounter medications on file as of 05/18/2021.    Review of Systems  Constitutional:  Negative for chills and fever.  HENT:  Positive for congestion, postnasal drip, rhinorrhea and sinus pressure. Negative for ear discharge, ear pain, sneezing and sore throat.   Eyes:  Negative for pain, redness and visual disturbance.  Respiratory:  Positive for cough. Negative for chest tightness, shortness of breath and wheezing.   Cardiovascular:  Negative for chest pain and leg swelling.  Genitourinary:  Negative for difficulty urinating and dysuria.  Musculoskeletal:  Negative for back pain, gait problem and myalgias.  Skin:  Negative for rash.  Neurological:  Negative for light-headedness and headaches.  Psychiatric/Behavioral:  Negative for agitation and behavioral problems.   All other systems reviewed and are  negative.  Observations/Objective: Patient sounds comfortable and in no acute distress  Assessment and Plan: Problem List Items Addressed This Visit   None Visit Diagnoses     COVID-19 virus infection    -  Primary   Relevant Medications   molnupiravir EUA (LAGEVRIO) 200 mg CAPS capsule       Will treat with molnupiravir and has mild symptoms Follow up plan: Return if symptoms worsen or fail to improve.     I discussed the assessment and  treatment plan with the patient. The patient was provided an opportunity to ask questions and all were answered. The patient agreed with the plan and demonstrated an understanding of the instructions.   The patient was advised to call back or seek an in-person evaluation if the symptoms worsen or if the condition fails to improve as anticipated.  The above assessment and management plan was discussed with the patient. The patient verbalized understanding of and has agreed to the management plan. Patient is aware to call the clinic if symptoms persist or worsen. Patient is aware when to return to the clinic for a follow-up visit. Patient educated on when it is appropriate to go to the emergency department.    I provided 5 minutes of non-face-to-face time during this encounter.    Worthy Rancher, MD

## 2021-05-21 ENCOUNTER — Other Ambulatory Visit: Payer: Self-pay | Admitting: Neurology

## 2021-05-26 DIAGNOSIS — N1831 Chronic kidney disease, stage 3a: Secondary | ICD-10-CM | POA: Diagnosis not present

## 2021-05-28 ENCOUNTER — Other Ambulatory Visit: Payer: Self-pay | Admitting: Family Medicine

## 2021-06-08 ENCOUNTER — Ambulatory Visit (INDEPENDENT_AMBULATORY_CARE_PROVIDER_SITE_OTHER): Payer: Medicare Other

## 2021-06-08 ENCOUNTER — Other Ambulatory Visit: Payer: Self-pay

## 2021-06-08 DIAGNOSIS — Z23 Encounter for immunization: Secondary | ICD-10-CM | POA: Diagnosis not present

## 2021-06-20 DIAGNOSIS — N1831 Chronic kidney disease, stage 3a: Secondary | ICD-10-CM | POA: Diagnosis not present

## 2021-06-20 DIAGNOSIS — R7303 Prediabetes: Secondary | ICD-10-CM | POA: Diagnosis not present

## 2021-06-25 ENCOUNTER — Other Ambulatory Visit: Payer: Self-pay | Admitting: Family Medicine

## 2021-06-27 ENCOUNTER — Other Ambulatory Visit: Payer: Self-pay

## 2021-06-27 ENCOUNTER — Encounter: Payer: Self-pay | Admitting: Family Medicine

## 2021-06-27 ENCOUNTER — Ambulatory Visit (INDEPENDENT_AMBULATORY_CARE_PROVIDER_SITE_OTHER): Payer: Medicare Other | Admitting: Family Medicine

## 2021-06-27 VITALS — BP 131/74 | HR 69 | Temp 97.7°F | Ht 63.0 in | Wt 153.0 lb

## 2021-06-27 DIAGNOSIS — M25561 Pain in right knee: Secondary | ICD-10-CM

## 2021-06-27 DIAGNOSIS — R7303 Prediabetes: Secondary | ICD-10-CM

## 2021-06-27 DIAGNOSIS — E7800 Pure hypercholesterolemia, unspecified: Secondary | ICD-10-CM

## 2021-06-27 DIAGNOSIS — R6889 Other general symptoms and signs: Secondary | ICD-10-CM | POA: Diagnosis not present

## 2021-06-27 DIAGNOSIS — G8929 Other chronic pain: Secondary | ICD-10-CM

## 2021-06-27 DIAGNOSIS — R5382 Chronic fatigue, unspecified: Secondary | ICD-10-CM | POA: Diagnosis not present

## 2021-06-27 DIAGNOSIS — N1832 Chronic kidney disease, stage 3b: Secondary | ICD-10-CM | POA: Diagnosis not present

## 2021-06-27 LAB — BAYER DCA HB A1C WAIVED: HB A1C (BAYER DCA - WAIVED): 6.3 % — ABNORMAL HIGH (ref 4.8–5.6)

## 2021-06-27 MED ORDER — DEXAMETHASONE 2 MG PO TABS: ORAL_TABLET | ORAL | 0 refills | Status: DC

## 2021-06-27 NOTE — Progress Notes (Signed)
Subjective: CC: Fatigue PCP: [name], DO Kimberly Suarez is a 80 y.o. female presenting to clinic today for:  1.  Chronic fatigue Patient reports chronic fatigue with a recent exacerbation after having had COVID-19.  She is compliant with her B12 orally.  She reports a recent tick bite and wonders if perhaps she has Lyme disease as she had a small rash surrounding the tick bite.  2.  CKD 3B Had recent visit with Dr. Marval Regal.  Has not had any reports of lab results and is inquiring about that today.  Working to keep her kidneys as well-maintained as possible.  Hydrate adequately.  Avoid NSAIDs.  3.  Chronic joint pain Chronic joint pain present with the right knee being the main issue.  She recently had a course of Decadron provided for colitis and she found that this was super helpful in her joint pain.  She has history of prediabetes.  Was not aware that this could impact blood sugar but would like to have a refill to have on hand for future flareups until she can see [facility].  Needs new referral as she was most recently seen in [place] but no longer wishes to go out there.  Has had viscosupplementation that was not helpful but did respond to corticosteroid injection would like to continue getting this with her orthopedist   ROS: Per HPI  No Known Allergies Past Medical History:  Diagnosis Date   ASCUS (atypical squamous cells of undetermined significance) on Pap smear 2011x2   Negative high-risk HPV, normal Pap smears 2012/2013   Atypical nevus 05/02/2007   right back-moderate    Cancer ([place])    Cataract    Diverticulosis    GERD (gastroesophageal reflux disease)    Hiatal hernia    Hyperlipemia    Hypertension    IBS (irritable bowel syndrome)    Kidney disease    Lymphocytic colitis 2007   Neuropathy    SCC (squamous cell carcinoma) 01/19/2006   bridge of nose (CX35FU)   SCC (squamous cell carcinoma) 03/08/2017   right inner lower  shin (CX35FU)   SCC (squamous cell carcinoma) x 2 02/24/2002   right bridge of nose (Cx35FU),right bridge of nose   Uterine prolapse    Pessary    Current Outpatient Medications:    Alpha-Lipoic Acid 600 MG CAPS, Take by mouth., Disp: , Rfl:    aspirin 81 MG tablet, Take 81 mg by mouth daily., Disp: , Rfl:    atorvastatin (LIPITOR) 40 MG tablet, Take 1 tablet (40 mg total) by mouth daily., Disp: 90 tablet, Rfl: 3   enalapril (VASOTEC) 10 MG tablet, Take 1 tablet (10 mg total) by mouth 2 (two) times daily., Disp: 180 tablet, Rfl: 3   famotidine (PEPCID) 20 MG tablet, Take 1 tablet (20 mg total) by mouth daily., Disp: 90 tablet, Rfl: 3   fluticasone (FLONASE) 50 MCG/ACT nasal spray, Place 1 spray into both nostrils 2 (two) times daily as needed for allergies or rhinitis., Disp: 16 g, Rfl: 6   gabapentin (NEURONTIN) 100 MG capsule, Take 3 capsules (300 mg total) by mouth at bedtime., Disp: 270 capsule, Rfl: 4   Glucosamine-Chondroitin (GLUCOSAMINE CHONDR COMPLEX PO), Take 1 tablet by mouth 2 (two) times daily., Disp: , Rfl:    hydrochlorothiazide (HYDRODIURIL) 25 MG tablet, TAKE 1 TABLET BY MOUTH  DAILY, Disp: 90 tablet, Rfl: 0   Multiple Vitamins-Minerals (MULTIVITAMIN ADULT PO), Take 1 tablet by mouth daily., Disp: ,  Rfl:    Omega-3 Fatty Acids (FISH OIL) 1000 MG CAPS, Take 1 capsule by mouth daily., Disp: , Rfl:    saccharomyces boulardii (FLORASTOR) 250 MG capsule, Take 250 mg by mouth 2 (two) times daily., Disp: , Rfl:    vitamin B-12 (CYANOCOBALAMIN) 1000 MCG tablet, Take 1,000 mcg by mouth daily., Disp: , Rfl:    vitamin B-12 (CYANOCOBALAMIN) 500 MCG tablet, Take 500 mcg by mouth daily., Disp: , Rfl:    VITAMIN D PO, Take by mouth., Disp: , Rfl:    dexamethasone (DECADRON) 2 MG tablet, Take 4 tablets for 3 days then 3 tablets for 3 days then 2 tablets for 3 days then 1 tablet for 3 days (Patient not taking: Reported on 06/27/2021), Disp: 30 tablet, Rfl: 0 Social History   Socioeconomic  History   Marital status: Married    Spouse name: Not on file   Number of children: 3    Years of education: Not on file   Highest education level: 8th grade  Occupational History   Occupation: Retired   Tobacco Use   Smoking status: Never   Smokeless tobacco: Never  Vaping Use   Vaping Use: Never used  Substance and Sexual Activity   Alcohol use: Yes    Alcohol/week: 0.0 standard drinks    Comment: Rare   Drug use: No   Sexual activity: Yes    Birth control/protection: Post-menopausal    Comment: 1st intercourse 20 yo--1 partner  Other Topics Concern   Not on file  Social History Narrative   Lives at home with husband.   Right-handed.   Caffeine use: one cup caffeine some days.   Social Determinants of Health   Financial Resource Strain: Not on file  Food Insecurity: Not on file  Transportation Needs: Not on file  Physical Activity: Not on file  Stress: Not on file  Social Connections: Not on file  Intimate Partner Violence: Not on file   Family History  Problem Relation Age of Onset   Heart disease Mother    Diabetes Brother        BKA   COPD Father    Asthma Father    Alzheimer's disease Sister    Alzheimer's disease Brother    Multiple sclerosis Sister    Heart disease Sister    Diabetes Brother    Early death Brother        died in war   Colon cancer Neg Hx    Kidney disease Neg Hx    Liver disease Neg Hx     Objective: Office vital signs reviewed. BP 131/74   Pulse 69   Temp 97.7 F (36.5 C)   Ht 5\' 3"  (1.6 m)   Wt 153 lb (69.4 kg)   SpO2 97%   BMI 27.10 kg/m   Physical Examination:  General: Awake, alert, well nourished, No acute distress HEENT: Normal, sclera white, MMM Cardio: regular rate and rhythm, S1S2 heard, no murmurs appreciated Pulm: clear to auscultation bilaterally, no wheezes, rhonchi or rales; normal work of breathing on room air MSK: antalgic gait and station  Assessment/ Plan: 80 y.o. female   Stage 3b chronic  kidney disease ([place]) - Plan: CBC, CANCELED: Renal Function Panel  Pre-diabetes - Plan: Bayer DCA Hb A1c Waived  Pure hypercholesterolemia - Plan: Lipid Panel  Chronic fatigue - Plan: Vitamin B12, TSH, T4, Free, CBC, Lyme Disease Serology w/Reflex  Chronic pain of right knee - Plan: Ambulatory referral to Orthopedic Surgery, dexamethasone (DECADRON) 2  MG tablet  Renal function reviewed from September labs which showed GFR of 31.  Awaiting this weeks lab check from specialist  A1c with persistent PRE diabetes and slight rise in A1c.  Reinforced carb restriction.  Offered referral to nutrition but declined  Check labs for chronic fatigue but I suspect this is secondary to recent COVID 19 infection.  I have given her a renewal on the Decadron but again reinforced that this is not intended for regularly use as it can degrade bones, increase risk for progression of diabetes.  Referral to orthopedics in [place] renewed  No orders of the defined types were placed in this encounter.  No orders of the defined types were placed in this encounter.    [name], DO [place] [phone]

## 2021-06-28 ENCOUNTER — Other Ambulatory Visit: Payer: Self-pay | Admitting: Family Medicine

## 2021-06-28 DIAGNOSIS — N1832 Chronic kidney disease, stage 3b: Secondary | ICD-10-CM

## 2021-06-28 DIAGNOSIS — R7303 Prediabetes: Secondary | ICD-10-CM

## 2021-06-28 DIAGNOSIS — E7800 Pure hypercholesterolemia, unspecified: Secondary | ICD-10-CM

## 2021-06-28 LAB — CBC
Hematocrit: 38.1 % (ref 34.0–46.6)
Hemoglobin: 12.5 g/dL (ref 11.1–15.9)
MCH: 29.5 pg (ref 26.6–33.0)
MCHC: 32.8 g/dL (ref 31.5–35.7)
MCV: 90 fL (ref 79–97)
Platelets: 223 10*3/uL (ref 150–450)
RBC: 4.24 x10E6/uL (ref 3.77–5.28)
RDW: 13.2 % (ref 11.7–15.4)
WBC: 6.3 10*3/uL (ref 3.4–10.8)

## 2021-06-28 LAB — LIPID PANEL
Chol/HDL Ratio: 3.9 ratio (ref 0.0–4.4)
Cholesterol, Total: 168 mg/dL (ref 100–199)
HDL: 43 mg/dL
LDL Chol Calc (NIH): 94 mg/dL (ref 0–99)
Triglycerides: 178 mg/dL — ABNORMAL HIGH (ref 0–149)
VLDL Cholesterol Cal: 31 mg/dL (ref 5–40)

## 2021-06-28 LAB — VITAMIN B12: Vitamin B-12: >2000 pg/mL — ABNORMAL HIGH (ref 232–1245)

## 2021-06-28 LAB — T4, FREE: Free T4: 1.04 ng/dL (ref 0.82–1.77)

## 2021-06-28 LAB — LYME DISEASE SEROLOGY W/REFLEX: Lyme Total Antibody EIA: NEGATIVE

## 2021-06-28 LAB — TSH: TSH: 2.17 u[IU]/mL (ref 0.450–4.500)

## 2021-07-14 ENCOUNTER — Telehealth: Payer: Self-pay | Admitting: Family Medicine

## 2021-07-14 NOTE — Telephone Encounter (Signed)
Covering PCP

## 2021-07-14 NOTE — Telephone Encounter (Signed)
Called back. No answer. Left VM. Maybe a nurse can try calling her back in the AM.

## 2021-07-15 NOTE — Telephone Encounter (Signed)
Attempted to contact - NA 

## 2021-07-20 NOTE — Telephone Encounter (Signed)
No return call was made will close encounter.

## 2021-07-28 ENCOUNTER — Encounter: Payer: Medicare Other | Attending: Family Medicine | Admitting: Nutrition

## 2021-07-28 ENCOUNTER — Other Ambulatory Visit: Payer: Self-pay

## 2021-07-28 ENCOUNTER — Encounter: Payer: Self-pay | Admitting: Nutrition

## 2021-07-28 VITALS — Ht 64.0 in | Wt 152.0 lb

## 2021-07-28 DIAGNOSIS — N289 Disorder of kidney and ureter, unspecified: Secondary | ICD-10-CM

## 2021-07-28 DIAGNOSIS — N1832 Chronic kidney disease, stage 3b: Secondary | ICD-10-CM | POA: Insufficient documentation

## 2021-07-28 DIAGNOSIS — E782 Mixed hyperlipidemia: Secondary | ICD-10-CM | POA: Insufficient documentation

## 2021-07-28 DIAGNOSIS — R7303 Prediabetes: Secondary | ICD-10-CM | POA: Insufficient documentation

## 2021-07-28 DIAGNOSIS — N183 Chronic kidney disease, stage 3 unspecified: Secondary | ICD-10-CM

## 2021-07-28 NOTE — Progress Notes (Signed)
Medical Nutrition Therapy  Appointment Start time:  39   Appointment End time:  74  Primary concerns today: Kidney disease, Hyperlipidemia, Pre Diabetes Referral diagnosis: N18.32, R73.03, E78 Preferred learning style: no preference  Learning readiness: Ready    NUTRITION ASSESSMENT  She has been working on staying away from salty foods and processed foods. Wants to know how to improve her diet to help her kidneys and prevent diabetes.  A1C 6.3%. Anthropometrics  Wt Readings from Last 3 Encounters:  07/28/21 152 lb (68.9 kg)  06/27/21 153 lb (69.4 kg)  05/11/21 151 lb (68.5 kg)   Ht Readings from Last 3 Encounters:  07/28/21 5' 4"  (1.626 m)  06/27/21 5' 3"  (1.6 m)  05/11/21 5' 3"  (1.6 m)   Body mass index is 26.09 kg/m. @BMIFA @ Facility age limit for growth percentiles is 20 years. Facility age limit for growth percentiles is 20 years.    Clinical Medical Hx: CKD IIIb, HTN, Hyperlipidemia, Pre diabetes Medications: see chart Labs:  Lab Results  Component Value Date   HGBA1C 6.3 (H) 06/27/2021   CMP Latest Ref Rng & Units 12/24/2020 06/25/2020 04/26/2020  Glucose 65 - 99 mg/dL 101(H) 102(H) -  BUN 8 - 27 mg/dL 29(H) 21 -  Creatinine 0.57 - 1.00 mg/dL 1.48(H) 1.47(H) -  Sodium 134 - 144 mmol/L 140 140 -  Potassium 3.5 - 5.2 mmol/L 4.1 3.9 -  Chloride 96 - 106 mmol/L 98 99 -  CO2 20 - 29 mmol/L 25 25 -  Calcium 8.7 - 10.3 mg/dL 9.6 9.2 -  Total Protein 6.0 - 8.5 g/dL 6.9 6.8 7.0  Total Bilirubin 0.0 - 1.2 mg/dL 0.2 0.3 -  Alkaline Phos 44 - 121 IU/L 43(L) 50 -  AST 0 - 40 IU/L 25 23 -  ALT 0 - 32 IU/L 23 20 -    Notable Signs/Symptoms: none  Lifestyle & Dietary Hx Had covid and hasn't had the energy or strength since then. Takes Vit B 12 Trying to eat to help her kidneys. Limited activity due to leg pain.  Estimated daily fluid intake: 40 oz Supplements: Vit B12 Sleep: good Stress / self-care: her health Current average weekly physical activity: ADL  due to leg issues and fatigue  24-Hr Dietary Recall Tries to eat 3 meals, but sometimes only 2. Drinks water. Estimated Energy Needs Calories: 1200 Carbohydrate: 135g Protein: 90g Fat: 33g   NUTRITION DIAGNOSIS  NB-1.1 Food and nutrition-related knowledge deficit As related to CKD and prediabetes.  As evidenced by A1C 6.3% AND eGFR 36 .   NUTRITION INTERVENTION  Nutrition education (E-1) on the following topics:  Chronic Kidney Disease Nutrition and Pre-Diabetes education provided on My Plate, CHO counting, meal planning, portion sizes, timing of meals, avoiding snacks between meals , benefits of exercising 30 minutes per day and prevention of DM. Eating more plant based diet.  Handouts Provided Include  My Plate Kidney Disease Nutrition Plant based meal planning.  Learning Style & Readiness for Change Teaching method utilized: Visual & Auditory  Demonstrated degree of understanding via: Teach Back  Barriers to learning/adherence to lifestyle change: none  Goals Established by Pt Goals  Increase exercise 30 minutes 3 times per week. Cut out fat back and processed desserts/sweets Cut out salt and use more herbs and spices Focus on more plant based foods- whole grains, fruits, vegetables. Drink 4-5 of bottles of water per day.   MONITORING & EVALUATION Dietary intake, weekly physical activity, and diet in 1-3 months.  Next  Steps  Patient is to work on meal planning and exercise.Marland Kitchen

## 2021-07-28 NOTE — Patient Instructions (Signed)
Goals  Increase exercise 30 minutes 3 times per week. Cut out fat back and processed desserts/sweets Cut out salt and use more herbs and spices Focus on more plant based foods- whole grains, fruits, vegetables. Drink 4-5 of bottles of water per day.

## 2021-08-02 ENCOUNTER — Telehealth: Payer: Self-pay | Admitting: Family Medicine

## 2021-08-02 NOTE — Telephone Encounter (Signed)
Appt made,  pt aware

## 2021-08-09 ENCOUNTER — Ambulatory Visit (INDEPENDENT_AMBULATORY_CARE_PROVIDER_SITE_OTHER): Payer: Medicare Other | Admitting: Family Medicine

## 2021-08-09 ENCOUNTER — Encounter: Payer: Self-pay | Admitting: Family Medicine

## 2021-08-09 VITALS — BP 127/77 | HR 83 | Temp 98.0°F | Ht 64.0 in | Wt 151.2 lb

## 2021-08-09 DIAGNOSIS — R6889 Other general symptoms and signs: Secondary | ICD-10-CM

## 2021-08-09 NOTE — Progress Notes (Signed)
Subjective: CC:?  Poor blood flow PCP: [name], DO ESP:Kimberly Suarez is a 80 y.o. female presenting to clinic today for:  1.  Abnormal ABI Patient was seen by her Medicare annual nurse and was told that she had decreased blood flow to the left lower extremity.  She had an ABI in the mildly abnormal range for the left lower extremity.  She denies any pain in that extremity with ambulation or activity.  She does report that her feet feel cool at times.  Sometimes her left foot looks a little darker than her right and this caused her concern.  Medical history is significant for stage IIIb CKD   ROS: Per HPI  No Known Allergies Past Medical History:  Diagnosis Date   ASCUS (atypical squamous cells of undetermined significance) on Pap smear 2011x2   Negative high-risk HPV, normal Pap smears 2012/2013   Atypical nevus 05/02/2007   right back-moderate    Cancer ([place])    Cataract    Diverticulosis    GERD (gastroesophageal reflux disease)    Hiatal hernia    Hyperlipemia    Hypertension    IBS (irritable bowel syndrome)    Kidney disease    Lymphocytic colitis 2007   Neuropathy    SCC (squamous cell carcinoma) 01/19/2006   bridge of nose (CX35FU)   SCC (squamous cell carcinoma) 03/08/2017   right inner lower shin (CX35FU)   SCC (squamous cell carcinoma) x 2 02/24/2002   right bridge of nose (Cx35FU),right bridge of nose   Uterine prolapse    Pessary    Current Outpatient Medications:    Alpha-Lipoic Acid 600 MG CAPS, Take by mouth., Disp: , Rfl:    aspirin 81 MG tablet, Take 81 mg by mouth daily., Disp: , Rfl:    atorvastatin (LIPITOR) 40 MG tablet, Take 1 tablet (40 mg total) by mouth daily., Disp: 90 tablet, Rfl: 3   dexamethasone (DECADRON) 2 MG tablet, Take 4 tablets for 2 days then 3 tablets for 2 days then 2 tablets for 2 days then 1 tablet for 2 days, Disp: 20 tablet, Rfl: 0   enalapril (VASOTEC) 10 MG tablet, Take 1 tablet (10 mg total) by mouth 2 (two)  times daily., Disp: 180 tablet, Rfl: 3   famotidine (PEPCID) 20 MG tablet, Take 1 tablet (20 mg total) by mouth daily., Disp: 90 tablet, Rfl: 3   fluticasone (FLONASE) 50 MCG/ACT nasal spray, Place 1 spray into both nostrils 2 (two) times daily as needed for allergies or rhinitis., Disp: 16 g, Rfl: 6   gabapentin (NEURONTIN) 100 MG capsule, Take 3 capsules (300 mg total) by mouth at bedtime., Disp: 270 capsule, Rfl: 4   Glucosamine-Chondroitin (GLUCOSAMINE CHONDR COMPLEX PO), Take 1 tablet by mouth 2 (two) times daily., Disp: , Rfl:    hydrochlorothiazide (HYDRODIURIL) 25 MG tablet, TAKE 1 TABLET BY MOUTH  DAILY, Disp: 90 tablet, Rfl: 0   Multiple Vitamins-Minerals (MULTIVITAMIN ADULT PO), Take 1 tablet by mouth daily., Disp: , Rfl:    Omega-3 Fatty Acids (FISH OIL) 1000 MG CAPS, Take 1 capsule by mouth daily., Disp: , Rfl:    saccharomyces boulardii (FLORASTOR) 250 MG capsule, Take 250 mg by mouth 2 (two) times daily., Disp: , Rfl:    vitamin B-12 (CYANOCOBALAMIN) 1000 MCG tablet, Take 1,000 mcg by mouth daily., Disp: , Rfl:    vitamin B-12 (CYANOCOBALAMIN) 500 MCG tablet, Take 500 mcg by mouth daily., Disp: , Rfl:    VITAMIN D PO,  Take by mouth., Disp: , Rfl:  Social History   Socioeconomic History   Marital status: Married    Spouse name: Not on file   Number of children: 3    Years of education: Not on file   Highest education level: 8th grade  Occupational History   Occupation: Retired   Tobacco Use   Smoking status: Never   Smokeless tobacco: Never  Vaping Use   Vaping Use: Never used  Substance and Sexual Activity   Alcohol use: Yes    Alcohol/week: 0.0 standard drinks    Comment: Rare   Drug use: No   Sexual activity: Yes    Birth control/protection: Post-menopausal    Comment: 1st intercourse 58 yo--1 partner  Other Topics Concern   Not on file  Social History Narrative   Lives at home with husband.   Right-handed.   Caffeine use: one cup caffeine some days.    Social Determinants of Health   Financial Resource Strain: Not on file  Food Insecurity: Not on file  Transportation Needs: Not on file  Physical Activity: Not on file  Stress: Not on file  Social Connections: Not on file  Intimate Partner Violence: Not on file   Family History  Problem Relation Age of Onset   Heart disease Mother    Diabetes Brother        BKA   COPD Father    Asthma Father    Alzheimer's disease Sister    Alzheimer's disease Brother    Multiple sclerosis Sister    Heart disease Sister    Diabetes Brother    Early death Brother        died in war   Colon cancer Neg Hx    Kidney disease Neg Hx    Liver disease Neg Hx     Objective: Office vital signs reviewed. BP 127/77    Pulse 83    Temp 98 F (36.7 C)    Ht 5\' 4"  (1.626 m)    Wt 151 lb 3.2 oz (68.6 kg)    SpO2 91%    BMI 25.95 kg/m   Physical Examination:  General: Awake, alert, well nourished, No acute distress Extremities: Lower leg warm but toes are somewhat cool.  +1 pedal pulse noted.  Cap refill normal.  Assessment/ Plan: 80 y.o. female   Abnormal ankle brachial index (ABI) - Plan: Ambulatory referral to Vascular Surgery  Patient had abnormal ABI with home screen test by her Medicare nurse.  She does have risk factors of CKD 3B.  I placed a referral to vascular surgery for further evaluation.  We discussed anticipate they may repeat her ABI and make plans from there.  Her exam was notable for +1 pedal pulses.  No orders of the defined types were placed in this encounter.  No orders of the defined types were placed in this encounter.    [name], DO [place] [phone]

## 2021-08-18 ENCOUNTER — Encounter: Payer: Self-pay | Admitting: Nutrition

## 2021-09-14 ENCOUNTER — Other Ambulatory Visit: Payer: Self-pay | Admitting: Family Medicine

## 2021-10-05 ENCOUNTER — Other Ambulatory Visit: Payer: Self-pay

## 2021-10-05 ENCOUNTER — Encounter: Payer: Self-pay | Admitting: Vascular Surgery

## 2021-10-05 ENCOUNTER — Other Ambulatory Visit (HOSPITAL_COMMUNITY): Payer: Self-pay | Admitting: Vascular Surgery

## 2021-10-05 ENCOUNTER — Ambulatory Visit (INDEPENDENT_AMBULATORY_CARE_PROVIDER_SITE_OTHER): Payer: Medicare Other

## 2021-10-05 ENCOUNTER — Ambulatory Visit: Payer: Medicare Other | Admitting: Vascular Surgery

## 2021-10-05 VITALS — BP 132/76 | HR 69 | Temp 98.1°F | Resp 16 | Ht 64.0 in | Wt 147.4 lb

## 2021-10-05 DIAGNOSIS — R6889 Other general symptoms and signs: Secondary | ICD-10-CM | POA: Diagnosis not present

## 2021-10-05 NOTE — Progress Notes (Signed)
[facility] [place]  Patient name: Kimberly Suarez MRN: [id number] DOB: 02/08/1941 Sex: female  REASON FOR CONSULT: Evaluation and discussion of abnormal screening study  HPI: Kimberly Suarez is a 81 y.o. female, who is here today for discussion of abnormal screening study of lower extremity arterial flow.  She is here today with her daughter.  She is quite active at 29.  She reports that walking with no claudication type symptoms and tries to walk 30 to 40 minutes/day weather permitting.  She denies any prior history of tissue loss on her lower extremities and has no arterial rest pain.  She does have neuropathy and has improvement in her symptoms with gabapentin.  She underwent screening arterial lower extremity studies to her home health insurance company and this was abnormal.  She is seeing [place] today for discussion  Past Medical History:  Diagnosis Date   ASCUS (atypical squamous cells of undetermined significance) on Pap smear 2011x2   Negative high-risk HPV, normal Pap smears 2012/2013   Atypical nevus 05/02/2007   right back-moderate    Cancer ([place])    Cataract    Diverticulosis    GERD (gastroesophageal reflux disease)    Hiatal hernia    Hyperlipemia    Hypertension    IBS (irritable bowel syndrome)    Kidney disease    Lymphocytic colitis 2007   Neuropathy    SCC (squamous cell carcinoma) 01/19/2006   bridge of nose (CX35FU)   SCC (squamous cell carcinoma) 03/08/2017   right inner lower shin (CX35FU)   SCC (squamous cell carcinoma) x 2 02/24/2002   right bridge of nose (Cx35FU),right bridge of nose   Uterine prolapse    Pessary    Family History  Problem Relation Age of Onset   Heart disease Mother    Diabetes Brother        BKA   COPD Father    Asthma Father    Alzheimer's disease Sister    Alzheimer's disease Brother    Multiple sclerosis Sister    Heart disease Sister    Diabetes Brother    Kimberly Suarez  death Brother        died in war   Colon cancer Neg Hx    Kidney disease Neg Hx    Liver disease Neg Hx     SOCIAL HISTORY: Social History   Socioeconomic History   Marital status: Married    Spouse name: Not on file   Number of children: 3    Years of education: Not on file   Highest education level: 8th grade  Occupational History   Occupation: Retired   Tobacco Use   Smoking status: Never   Smokeless tobacco: Never  Vaping Use   Vaping Use: Never used  Substance and Sexual Activity   Alcohol use: Yes    Alcohol/week: 0.0 standard drinks    Comment: Rare   Drug use: No   Sexual activity: Yes    Birth control/protection: Post-menopausal    Comment: 1st intercourse 25 yo--1 partner  Other Topics Concern   Not on file  Social History Narrative   Lives at home with husband.   Right-handed.   Caffeine use: one cup caffeine some days.   Social Determinants of Health   Financial Resource Strain: Not on file  Food Insecurity: Not on file  Transportation Needs: Not on file  Physical Activity: Not on file  Stress: Not on file  Social Connections: Not on file  Intimate Partner Violence: Not on file    No Known Allergies  Current Outpatient Medications  Medication Sig Dispense Refill   Alpha-Lipoic Acid 600 MG CAPS Take by mouth.     aspirin 81 MG tablet Take 81 mg by mouth daily. (Patient not taking: Reported on 08/09/2021)     atorvastatin (LIPITOR) 40 MG tablet Take 1 tablet (40 mg total) by mouth daily. 90 tablet 3   dexamethasone (DECADRON) 2 MG tablet Take 4 tablets for 2 days then 3 tablets for 2 days then 2 tablets for 2 days then 1 tablet for 2 days 20 tablet 0   enalapril (VASOTEC) 10 MG tablet Take 1 tablet (10 mg total) by mouth 2 (two) times daily. 180 tablet 3   famotidine (PEPCID) 20 MG tablet Take 1 tablet (20 mg total) by mouth daily. 90 tablet 3   fluticasone (FLONASE) 50 MCG/ACT nasal spray Place 1 spray into both nostrils 2 (two) times daily as  needed for allergies or rhinitis. 16 g 6   gabapentin (NEURONTIN) 100 MG capsule Take 3 capsules (300 mg total) by mouth at bedtime. 270 capsule 4   Glucosamine-Chondroitin (GLUCOSAMINE CHONDR COMPLEX PO) Take 1 tablet by mouth 2 (two) times daily.     hydrochlorothiazide (HYDRODIURIL) 25 MG tablet TAKE 1 TABLET BY MOUTH DAILY 90 tablet 0   Multiple Vitamins-Minerals (MULTIVITAMIN ADULT PO) Take 1 tablet by mouth daily.     Omega-3 Fatty Acids (FISH OIL) 1000 MG CAPS Take 1 capsule by mouth daily.     saccharomyces boulardii (FLORASTOR) 250 MG capsule Take 250 mg by mouth 2 (two) times daily.     vitamin B-12 (CYANOCOBALAMIN) 1000 MCG tablet Take 1,000 mcg by mouth daily.     vitamin B-12 (CYANOCOBALAMIN) 500 MCG tablet Take 500 mcg by mouth daily.     VITAMIN D PO Take by mouth.     No current facility-administered medications for this visit.    REVIEW OF SYSTEMS:  [X]  denotes positive finding, [ ]  denotes negative finding Cardiac  Comments:  Chest pain or chest pressure:    Shortness of breath upon exertion:    Short of breath when lying flat:    Irregular heart rhythm:        Vascular    Pain in calf, thigh, or hip brought on by ambulation:    Pain in feet at night that wakes you up from your sleep:     Blood clot in your veins:    Leg swelling:         Pulmonary    Oxygen at home:    Productive cough:     Wheezing:         Neurologic    Sudden weakness in arms or legs:     Sudden numbness in arms or legs:     Sudden onset of difficulty speaking or slurred speech:    Temporary loss of vision in one eye:     Problems with dizziness:         Gastrointestinal    Blood in stool:     Vomited blood:         Genitourinary    Burning when urinating:     Blood in urine:        Psychiatric    Major depression:         Hematologic    Bleeding problems:    Problems with blood clotting too easily:        Skin  Rashes or ulcers:        Constitutional    Fever or  chills:      PHYSICAL EXAM: Vitals:   10/05/21 1350  BP: 132/76  Pulse: 69  Resp: 16  Temp: 98.1 F (36.7 C)  TempSrc: Temporal  SpO2: 96%  Weight: 147 lb 6 oz (66.8 kg)  Height: 5\' 4"  (1.626 m)    GENERAL: The patient is a well-nourished female, in no acute distress. The vital signs are documented above. CARDIOVASCULAR: Carotid arteries without bruits bilaterally.  2+ radial 2+ popliteal and 2+ dorsalis pedis pulses bilaterally PULMONARY: There is good air exchange  MUSCULOSKELETAL: There are no major deformities or cyanosis. NEUROLOGIC: No focal weakness or paresthesias are detected. SKIN: There are no ulcers or rashes noted. PSYCHIATRIC: The patient has a normal affect.  DATA:  Noninvasive studies in our office today were reviewed with the patient.  This reveals normal ankle arm index bilaterally and normal triphasic waveforms bilaterally  MEDICAL ISSUES: Discussed these findings with the patient and her daughter.  She does not have any evidence of arterial insufficiency by noninvasive studies or by physical exam and has no symptoms to suggest this.  She was reassured with this discussion and will see [place] again on an as-needed basis   Rosetta Posner, MD [facility] [phone] Pager [phone]  Note: Portions of this report may have been transcribed using voice recognition software.  Every effort has been made to ensure accuracy; however, inadvertent computerized transcription errors may still be present.

## 2021-10-06 ENCOUNTER — Telehealth: Payer: Self-pay | Admitting: Family Medicine

## 2021-10-06 ENCOUNTER — Other Ambulatory Visit: Payer: Self-pay

## 2021-10-06 DIAGNOSIS — N1832 Chronic kidney disease, stage 3b: Secondary | ICD-10-CM

## 2021-10-06 DIAGNOSIS — R7989 Other specified abnormal findings of blood chemistry: Secondary | ICD-10-CM

## 2021-10-06 DIAGNOSIS — R748 Abnormal levels of other serum enzymes: Secondary | ICD-10-CM

## 2021-10-06 DIAGNOSIS — R7303 Prediabetes: Secondary | ICD-10-CM

## 2021-10-06 NOTE — Telephone Encounter (Signed)
Orders have been placed.

## 2021-10-06 NOTE — Telephone Encounter (Signed)
Patient would like to come before appt to do labs. Please add.

## 2021-10-11 ENCOUNTER — Ambulatory Visit (INDEPENDENT_AMBULATORY_CARE_PROVIDER_SITE_OTHER): Payer: Medicare Other | Admitting: Family Medicine

## 2021-10-11 ENCOUNTER — Other Ambulatory Visit: Payer: Medicare Other

## 2021-10-11 ENCOUNTER — Encounter: Payer: Self-pay | Admitting: Family Medicine

## 2021-10-11 VITALS — BP 107/66 | HR 77 | Temp 98.3°F | Ht 64.0 in | Wt 148.4 lb

## 2021-10-11 DIAGNOSIS — M7121 Synovial cyst of popliteal space [Baker], right knee: Secondary | ICD-10-CM | POA: Diagnosis not present

## 2021-10-11 DIAGNOSIS — R7989 Other specified abnormal findings of blood chemistry: Secondary | ICD-10-CM

## 2021-10-11 DIAGNOSIS — N1832 Chronic kidney disease, stage 3b: Secondary | ICD-10-CM | POA: Diagnosis not present

## 2021-10-11 DIAGNOSIS — R7303 Prediabetes: Secondary | ICD-10-CM

## 2021-10-11 DIAGNOSIS — Z0000 Encounter for general adult medical examination without abnormal findings: Secondary | ICD-10-CM

## 2021-10-11 DIAGNOSIS — Z0001 Encounter for general adult medical examination with abnormal findings: Secondary | ICD-10-CM

## 2021-10-11 DIAGNOSIS — E7800 Pure hypercholesterolemia, unspecified: Secondary | ICD-10-CM

## 2021-10-11 DIAGNOSIS — R748 Abnormal levels of other serum enzymes: Secondary | ICD-10-CM | POA: Diagnosis not present

## 2021-10-11 LAB — LIPID PANEL

## 2021-10-11 LAB — BAYER DCA HB A1C WAIVED: HB A1C (BAYER DCA - WAIVED): 6 % — ABNORMAL HIGH (ref 4.8–5.6)

## 2021-10-11 NOTE — Progress Notes (Signed)
Kimberly Suarez is a 81 y.o. female presents to office today for annual physical exam examination.    Concerns today include: 1.  Posterior knee pain Patient reports posterior knee pain on the right side.  No preceding injury.  Denies overt knee pain anteriorly.  She will be seeing Dr. Alvan Dame for some hip issues but wonders if she should ask him about this knee issue as well.  She notes it difficult to bend it sometimes.  It can be irritating when she is trying to walk around on it.  Occupation: retired, Marital status: widowed, Substance use: none Diet: good, Exercise: stays as active as able Refills needed today: none Immunizations needed: Declines shingles vaccination Immunization History  Administered Date(s) Administered   Fluad Quad(high Dose 65+) 06/26/2019, 06/25/2020, 06/08/2021   Influenza, High Dose Seasonal PF 06/28/2015, 06/01/2016, 06/06/2017, 06/04/2018   Influenza,inj,Quad PF,6+ Mos 07/02/2013, 05/26/2014   Moderna Sars-Covid-2 Vaccination 12/23/2019, 01/20/2020, 09/07/2020   Pneumococcal Conjugate-13 07/02/2013   Pneumococcal Polysaccharide-23 01/25/2021   Tdap 05/29/2011     Past Medical History:  Diagnosis Date   ASCUS (atypical squamous cells of undetermined significance) on Pap smear 2011x2   Negative high-risk HPV, normal Pap smears 2012/2013   Atypical nevus 05/02/2007   right back-moderate    Cancer ([place])    Cataract    Diverticulosis    GERD (gastroesophageal reflux disease)    Hiatal hernia    Hyperlipemia    Hypertension    IBS (irritable bowel syndrome)    Kidney disease    Lymphocytic colitis 2007   Neuropathy    SCC (squamous cell carcinoma) 01/19/2006   bridge of nose (CX35FU)   SCC (squamous cell carcinoma) 03/08/2017   right inner lower shin (CX35FU)   SCC (squamous cell carcinoma) x 2 02/24/2002   right bridge of nose (Cx35FU),right bridge of nose   Uterine prolapse    Pessary   Social History   Socioeconomic History   Marital  status: Married    Spouse name: Not on file   Number of children: 3    Years of education: Not on file   Highest education level: 8th grade  Occupational History   Occupation: Retired   Tobacco Use   Smoking status: Never   Smokeless tobacco: Never  Vaping Use   Vaping Use: Never used  Substance and Sexual Activity   Alcohol use: Yes    Alcohol/week: 0.0 standard drinks    Comment: Rare   Drug use: No   Sexual activity: Yes    Birth control/protection: Post-menopausal    Comment: 1st intercourse 73 yo--1 partner  Other Topics Concern   Not on file  Social History Narrative   Lives at home with husband.   Right-handed.   Caffeine use: one cup caffeine some days.   Social Determinants of Health   Financial Resource Strain: Not on file  Food Insecurity: Not on file  Transportation Needs: Not on file  Physical Activity: Not on file  Stress: Not on file  Social Connections: Not on file  Intimate Partner Violence: Not on file   Past Surgical History:  Procedure Laterality Date   BREAST BIOPSY     unsure which breast   CATARACT EXTRACTION W/ INTRAOCULAR LENS IMPLANT Bilateral    Family History  Problem Relation Age of Onset   Heart disease Mother    Diabetes Brother        BKA   COPD Father    Asthma Father    Alzheimer's  disease Sister    Alzheimer's disease Brother    Multiple sclerosis Sister    Heart disease Sister    Diabetes Brother    Early death Brother        died in war   Colon cancer Neg Hx    Kidney disease Neg Hx    Liver disease Neg Hx     Current Outpatient Medications:    Alpha-Lipoic Acid 600 MG CAPS, Take by mouth., Disp: , Rfl:    atorvastatin (LIPITOR) 40 MG tablet, Take 1 tablet (40 mg total) by mouth daily., Disp: 90 tablet, Rfl: 3   dexamethasone (DECADRON) 2 MG tablet, Take 4 tablets for 2 days then 3 tablets for 2 days then 2 tablets for 2 days then 1 tablet for 2 days, Disp: 20 tablet, Rfl: 0   enalapril (VASOTEC) 10 MG tablet, Take  1 tablet (10 mg total) by mouth 2 (two) times daily., Disp: 180 tablet, Rfl: 3   famotidine (PEPCID) 20 MG tablet, Take 1 tablet (20 mg total) by mouth daily., Disp: 90 tablet, Rfl: 3   fluticasone (FLONASE) 50 MCG/ACT nasal spray, Place 1 spray into both nostrils 2 (two) times daily as needed for allergies or rhinitis., Disp: 16 g, Rfl: 6   gabapentin (NEURONTIN) 100 MG capsule, Take 3 capsules (300 mg total) by mouth at bedtime., Disp: 270 capsule, Rfl: 4   Glucosamine-Chondroitin (GLUCOSAMINE CHONDR COMPLEX PO), Take 1 tablet by mouth 2 (two) times daily., Disp: , Rfl:    hydrochlorothiazide (HYDRODIURIL) 25 MG tablet, TAKE 1 TABLET BY MOUTH DAILY, Disp: 90 tablet, Rfl: 0   Multiple Vitamins-Minerals (MULTIVITAMIN ADULT PO), Take 1 tablet by mouth daily., Disp: , Rfl:    Omega-3 Fatty Acids (FISH OIL) 1000 MG CAPS, Take 1 capsule by mouth daily., Disp: , Rfl:    saccharomyces boulardii (FLORASTOR) 250 MG capsule, Take 250 mg by mouth 2 (two) times daily., Disp: , Rfl:    vitamin B-12 (CYANOCOBALAMIN) 1000 MCG tablet, Take 1,000 mcg by mouth daily., Disp: , Rfl:    vitamin B-12 (CYANOCOBALAMIN) 500 MCG tablet, Take 500 mcg by mouth daily., Disp: , Rfl:    VITAMIN D PO, Take by mouth., Disp: , Rfl:    aspirin 81 MG tablet, Take 81 mg by mouth daily. (Patient not taking: Reported on 08/09/2021), Disp: , Rfl:   No Known Allergies   ROS: Review of Systems Pertinent items noted in HPI and remainder of comprehensive ROS otherwise negative.    Physical exam BP 107/66    Pulse 77    Temp 98.3 F (36.8 C)    Ht 5\' 4"  (1.626 m)    Wt 148 lb 6.4 oz (67.3 kg)    SpO2 94%    BMI 25.47 kg/m  General appearance: alert, cooperative, appears stated age, and no distress Head: Normocephalic, without obvious abnormality, atraumatic Eyes: negative findings: lids and lashes normal, conjunctivae and sclerae normal, corneas clear, and pupils equal, round, reactive to light and accomodation Ears: normal TM's and  external ear canals both ears Nose: Nares normal. Septum midline. Mucosa normal. No drainage or sinus tenderness. Throat: lips, mucosa, and tongue normal; teeth and gums normal Neck: no adenopathy, no carotid bruit, supple, symmetrical, trachea midline, and thyroid not enlarged, symmetric, no tenderness/mass/nodules Back: symmetric, no curvature. ROM normal. No CVA tenderness. Lungs: clear to auscultation bilaterally Heart: regular rate and rhythm, S1, S2 normal, no murmur, click, rub or gallop Abdomen: soft, non-tender; bowel sounds normal; no masses,  no  organomegaly Extremities: extremities normal, atraumatic, no cyanosis or edema Pulses: 2+ and symmetric Skin: Skin color, texture, turgor normal.  She has various solar lentigo noted along the upper extremities Lymph nodes: Cervical, supraclavicular, and axillary nodes normal. Neurologic: Grossly normal MSK: Right knee with palpable fullness in the posterior popliteal fossa consistent with Baker's cyst.  No tenderness palpation to the anterior knee.  No appreciable joint effusions or soft tissue swelling.  Patient is ambulating independently   Assessment/ Plan: Kimberly Suarez here for annual physical exam.   Annual physical exam  Stage 3b chronic kidney disease ([place]) - Plan: EKG 12-Lead  Pre-diabetes  Elevated vitamin B12 level  Pure hypercholesterolemia - Plan: EKG 12-Lead  Baker cyst, right  EKG obtained today.  No arrhythmia or evidence of ischemia appreciated. ?LVH but we had difficulty adhering the leads to her chest during her visit  Continue ACE inhibitor for renal function protection.  Check renal function  A1c down to 6.0.  Continue good lifestyle choices  B12 level obtained this morning and we will follow-up on that as appropriate  Fasting lipid obtained this morning.  Continue statin  Lesion of right posterior knee is consistent with a Baker's cyst.  I agree with evaluation by orthopedist.  We discussed that  there may be over the drain this lesion and injected with corticosteroid.  Handout provided today    Kimberly Suchy M. Lajuana Ripple, DO

## 2021-10-11 NOTE — Patient Instructions (Signed)
You had labs performed today.  You will be contacted with the results of the labs once they are available, usually in the next 3 business days for routine lab work.  If you have an active my chart account, they will be released to your MyChart.  If you prefer to have these labs released to you via telephone, please let us know.   Baker Cyst A Baker cyst, also called a popliteal cyst, is a growth that forms at the back of the knee. The cyst forms when the fluid-filled sac (bursa) that cushions the knee joint becomes enlarged. What are the causes? In most cases, a Baker cyst results from another knee problem that causes swelling inside the knee. This makes the fluid inside the knee joint (synovial fluid) flow into the bursa behind the knee, causing the bursa to enlarge. What increases the risk? You may be more likely to develop a Baker cyst if you already have a knee problem, such as: A tear in cartilage that cushions the knee joint (meniscal tear). A tear in the tissues that connect the bones of the knee joint (ligament tear). Knee swelling from osteoarthritis, rheumatoid arthritis, or gout. What are the signs or symptoms? The main symptom of this condition is a lump behind the knee. This may be the only symptom of the condition. The lump may be painful, especially when the knee is straightened. If the lump is painful, the pain may come and go. The knee may also be stiff. Symptoms may quickly get more severe if the cyst breaks open (ruptures). If the cyst ruptures, you may feel the following in your knee and calf: Sudden or worsening pain. Swelling. Bruising. Redness in the calf. A Baker cyst does not always cause symptoms. How is this diagnosed? This condition may be diagnosed based on your symptoms and medical history. Your health care provider will also do a physical exam. This may include: Feeling the cyst to check whether it is tender. Checking your knee for signs of another knee condition  that causes swelling. You may have imaging tests, such as: X-rays. MRI. Ultrasound. How is this treated? A Baker cyst that is not painful may go away without treatment. If the cyst gets large or painful, it will likely get better if the underlying knee problem is treated. If needed, treatment for a Baker cyst may include: Resting. Keeping weight off of the knee. This means not leaning on the knee to support your body weight. Taking NSAIDs, such as ibuprofen, to reduce pain and swelling. Having a procedure to drain the fluid from the cyst with a needle (aspiration). You may also get an injection of a medicine that reduces swelling (steroid). Having surgery. This may be needed if other treatments do not work. This usually involves correcting knee damage and removing the cyst. Follow these instructions at home: Activity Rest as told by your health care provider. Avoid activities that make pain or swelling worse. Return to your normal activities as told by your health care provider. Ask your health care provider what activities are safe for you. Do not use the injured limb to support your body weight until your health care provider says that you can. Use crutches as told by your health care provider. General instructions Take over-the-counter and prescription medicines only as told by your health care provider. Keep all follow-up visits as told by your health care provider. This is important. Contact a health care provider if: You have knee pain, stiffness, or swelling  that does not get better. Get help right away if: You have sudden or worsening pain and swelling in your calf area. Summary A Baker cyst, also called a popliteal cyst, is a growth that forms at the back of the knee. In most cases, a Baker cyst results from another knee problem that causes swelling inside the knee. A Baker cyst that is not painful may go away without treatment. If needed, treatment for a Baker cyst may include  resting, keeping weight off of the knee, medicines, or draining fluid from the cyst. Surgery may be needed if other treatments are not effective. This information is not intended to replace advice given to you by your health care provider. Make sure you discuss any questions you have with your health care provider. Document Revised: 12/27/2018 Document Reviewed: 12/27/2018 Elsevier Patient Education  [place].

## 2021-10-12 LAB — LIPID PANEL
Chol/HDL Ratio: 3.4 ratio (ref 0.0–4.4)
Cholesterol, Total: 140 mg/dL (ref 100–199)
HDL: 41 mg/dL
LDL Chol Calc (NIH): 77 mg/dL (ref 0–99)
Triglycerides: 120 mg/dL (ref 0–149)
VLDL Cholesterol Cal: 22 mg/dL (ref 5–40)

## 2021-10-12 LAB — CMP14+EGFR
ALT: 19 [IU]/L (ref 0–32)
AST: 22 [IU]/L (ref 0–40)
Albumin/Globulin Ratio: 1.3 (ref 1.2–2.2)
Albumin: 4 g/dL (ref 3.7–4.7)
Alkaline Phosphatase: 50 [IU]/L (ref 44–121)
BUN/Creatinine Ratio: 17 (ref 12–28)
BUN: 24 mg/dL (ref 8–27)
Bilirubin Total: 0.2 mg/dL (ref 0.0–1.2)
CO2: 28 mmol/L (ref 20–29)
Calcium: 10 mg/dL (ref 8.7–10.3)
Chloride: 97 mmol/L (ref 96–106)
Creatinine, Ser: 1.44 mg/dL — ABNORMAL HIGH (ref 0.57–1.00)
Globulin, Total: 3.1 g/dL (ref 1.5–4.5)
Glucose: 100 mg/dL — ABNORMAL HIGH (ref 70–99)
Potassium: 4.6 mmol/L (ref 3.5–5.2)
Sodium: 137 mmol/L (ref 134–144)
Total Protein: 7.1 g/dL (ref 6.0–8.5)
eGFR: 37 mL/min/{1.73_m2} — ABNORMAL LOW

## 2021-10-12 LAB — CBC WITH DIFFERENTIAL/PLATELET
Basophils Absolute: 0 10*3/uL (ref 0.0–0.2)
Basos: 1 %
EOS (ABSOLUTE): 0.2 10*3/uL (ref 0.0–0.4)
Eos: 4 %
Hematocrit: 37.2 % (ref 34.0–46.6)
Hemoglobin: 12.7 g/dL (ref 11.1–15.9)
Immature Grans (Abs): 0 10*3/uL (ref 0.0–0.1)
Immature Granulocytes: 0 %
Lymphocytes Absolute: 1.8 10*3/uL (ref 0.7–3.1)
Lymphs: 35 %
MCH: 30.7 pg (ref 26.6–33.0)
MCHC: 34.1 g/dL (ref 31.5–35.7)
MCV: 90 fL (ref 79–97)
Monocytes Absolute: 0.6 10*3/uL (ref 0.1–0.9)
Monocytes: 11 %
Neutrophils Absolute: 2.6 10*3/uL (ref 1.4–7.0)
Neutrophils: 49 %
Platelets: 247 10*3/uL (ref 150–450)
RBC: 4.14 x10E6/uL (ref 3.77–5.28)
RDW: 12.9 % (ref 11.7–15.4)
WBC: 5.2 10*3/uL (ref 3.4–10.8)

## 2021-10-12 LAB — VITAMIN B12: Vitamin B-12: 1290 pg/mL — ABNORMAL HIGH (ref 232–1245)

## 2021-10-14 DIAGNOSIS — M25562 Pain in left knee: Secondary | ICD-10-CM | POA: Diagnosis not present

## 2021-10-14 DIAGNOSIS — M25561 Pain in right knee: Secondary | ICD-10-CM | POA: Diagnosis not present

## 2021-10-14 DIAGNOSIS — M170 Bilateral primary osteoarthritis of knee: Secondary | ICD-10-CM | POA: Diagnosis not present

## 2021-10-19 ENCOUNTER — Encounter: Payer: Medicare Other | Attending: Family Medicine | Admitting: Nutrition

## 2021-10-19 ENCOUNTER — Other Ambulatory Visit: Payer: Self-pay

## 2021-10-19 ENCOUNTER — Encounter: Payer: Self-pay | Admitting: Nutrition

## 2021-10-19 VITALS — Ht 63.0 in | Wt 145.2 lb

## 2021-10-19 DIAGNOSIS — I10 Essential (primary) hypertension: Secondary | ICD-10-CM

## 2021-10-19 DIAGNOSIS — R7303 Prediabetes: Secondary | ICD-10-CM | POA: Insufficient documentation

## 2021-10-19 DIAGNOSIS — E782 Mixed hyperlipidemia: Secondary | ICD-10-CM | POA: Insufficient documentation

## 2021-10-19 DIAGNOSIS — I129 Hypertensive chronic kidney disease with stage 1 through stage 4 chronic kidney disease, or unspecified chronic kidney disease: Secondary | ICD-10-CM | POA: Insufficient documentation

## 2021-10-19 DIAGNOSIS — Z713 Dietary counseling and surveillance: Secondary | ICD-10-CM | POA: Insufficient documentation

## 2021-10-19 DIAGNOSIS — N1832 Chronic kidney disease, stage 3b: Secondary | ICD-10-CM | POA: Insufficient documentation

## 2021-10-19 DIAGNOSIS — N183 Chronic kidney disease, stage 3 unspecified: Secondary | ICD-10-CM

## 2021-10-19 NOTE — Progress Notes (Signed)
Medical Nutrition Therapy Follow up Appointment Start time:  52  Appointment End time:  71  Primary concerns today: Kidney disease, Hyperlipidemia, Pre Diabetes Referral diagnosis: N18.32, R73.03, E78 Preferred learning style: no preference  Learning readiness: Ready    NUTRITION ASSESSMENT Follow up Had some shots in both knees recently. A1C down to 6% from 6.3%.  Changed : eating more fruits and vegetables. Cut out sweets. TG came down from cutting out sweets-down to 120 mg/dl now. Kidneys are stable. Drinking only water. Lost 6 lbs. Feels good.  Anthropometrics  Wt Readings from Last 3 Encounters:  10/11/21 148 lb 6.4 oz (67.3 kg)  10/05/21 147 lb 6 oz (66.8 kg)  08/09/21 151 lb 3.2 oz (68.6 kg)   Ht Readings from Last 3 Encounters:  10/11/21 5' 4"  (1.626 m)  10/05/21 5' 4"  (1.626 m)  08/09/21 5' 4"  (1.626 m)   There is no height or weight on file to calculate BMI. @BMIFA @ Facility age limit for growth percentiles is 20 years. Facility age limit for growth percentiles is 20 years.    Clinical Medical Hx: CKD IIIb, HTN, Hyperlipidemia, Pre diabetes Medications: see chart Labs:  Lab Results  Component Value Date   HGBA1C 6.0 (H) 10/11/2021   CMP Latest Ref Rng & Units 10/11/2021 12/24/2020 06/25/2020  Glucose 70 - 99 mg/dL 100(H) 101(H) 102(H)  BUN 8 - 27 mg/dL 24 29(H) 21  Creatinine 0.57 - 1.00 mg/dL 1.44(H) 1.48(H) 1.47(H)  Sodium 134 - 144 mmol/L 137 140 140  Potassium 3.5 - 5.2 mmol/L 4.6 4.1 3.9  Chloride 96 - 106 mmol/L 97 98 99  CO2 20 - 29 mmol/L 28 25 25   Calcium 8.7 - 10.3 mg/dL 10.0 9.6 9.2  Total Protein 6.0 - 8.5 g/dL 7.1 6.9 6.8  Total Bilirubin 0.0 - 1.2 mg/dL 0.2 0.2 0.3  Alkaline Phos 44 - 121 IU/L 50 43(L) 50  AST 0 - 40 IU/L 22 25 23   ALT 0 - 32 IU/L 19 23 20    Lipid Panel     Component Value Date/Time   CHOL 140 10/11/2021 0957   CHOL 138 12/02/2012 0857   TRIG 120 10/11/2021 0957   TRIG 139 10/30/2016 1653   TRIG 102  12/02/2012 0857   HDL 41 10/11/2021 0957   HDL 43 10/30/2016 1653   HDL 50 12/02/2012 0857   CHOLHDL 3.4 10/11/2021 0957   LDLCALC 77 10/11/2021 0957   LDLCALC 69 05/18/2014 0816   LDLCALC 68 12/02/2012 0857   LABVLDL 22 10/11/2021 0957    Notable Signs/Symptoms: none  Lifestyle & Dietary Hx Had covid and hasn't had the energy or strength since then. Takes Vit B 12 Trying to eat to help her kidneys. Limited activity due to leg pain.  Estimated daily fluid intake: 40 oz Supplements: Vit B12 Sleep: good Stress / self-care: her health Current average weekly physical activity: ADL due to leg issues and fatigue  24-Hr Dietary Recall B)oatmeal with blueberries, L) [place] sandwich wheat bread, water,  D) Salmon and sweet potato, water Estimated Energy Needs Calories: 1200 Carbohydrate: 135g Protein: 90g Fat: 33g   NUTRITION DIAGNOSIS  NB-1.1 Food and nutrition-related knowledge deficit As related to CKD and prediabetes.  As evidenced by A1C 6.3% AND eGFR 36 .   NUTRITION INTERVENTION  Nutrition education (E-1) on the following topics:  Chronic Kidney Disease Nutrition and Pre-Diabetes education provided on My Plate, CHO counting, meal planning, portion sizes, timing of meals, avoiding snacks between meals , benefits of exercising  30 minutes per day and prevention of DM. Eating more plant based diet. Lifestyle Medicine - Whole Food, Plant Predominant Nutrition is highly recommended: Eat Plenty of vegetables, Mushrooms, fruits, Legumes, Whole Grains, Nuts, seeds in lieu of processed meats, processed snacks/pastries red meat, poultry, eggs.    -It is better to avoid simple carbohydrates including: Cakes, Sweet Desserts, Ice Cream, Soda (diet and regular), Sweet Tea, Candies, Chips, Cookies, Store Bought Juices, Alcohol in Excess of  1-2 drinks a day, Lemonade,  Artificial Sweeteners, Doughnuts, Coffee Creamers, "Sugar-free" Products, etc, etc.  This is not a complete  list.....  Exercise: If you are able: 30 -60 minutes a day ,4 days a week, or 150 minutes a week.  The longer the better.  Combine stretch, strength, and aerobic activities.  If you were told in the past that you have high risk for cardiovascular diseases, you may seek evaluation by your heart doctor prior to initiating moderate to intense exercise programs. Handouts Provided Include  My Plate Kidney Disease Nutrition Plant based meal planning.  Learning Style & Readiness for Change Teaching method utilized: Visual & Auditory  Demonstrated degree of understanding via: Teach Back  Barriers to learning/adherence to lifestyle change: none  Goals Established by Pt  Keep up the great job Continue to increase plant based foods Keep walking Get A1C to 5.7%   MONITORING & EVALUATION Dietary intake, weekly physical activity, and diet in  PRN  Next Steps  Patient is to work on meal planning and exercise.Marland Kitchen

## 2021-10-19 NOTE — Patient Instructions (Signed)
Keep up the great job Continue to increase plant based foods Keep walking Get A1C to 5.7%

## 2021-11-14 DIAGNOSIS — E785 Hyperlipidemia, unspecified: Secondary | ICD-10-CM | POA: Diagnosis not present

## 2021-11-14 DIAGNOSIS — D472 Monoclonal gammopathy: Secondary | ICD-10-CM | POA: Diagnosis not present

## 2021-11-14 DIAGNOSIS — K219 Gastro-esophageal reflux disease without esophagitis: Secondary | ICD-10-CM | POA: Diagnosis not present

## 2021-11-14 DIAGNOSIS — R7303 Prediabetes: Secondary | ICD-10-CM | POA: Diagnosis not present

## 2021-11-14 DIAGNOSIS — N1831 Chronic kidney disease, stage 3a: Secondary | ICD-10-CM | POA: Diagnosis not present

## 2021-11-14 DIAGNOSIS — I129 Hypertensive chronic kidney disease with stage 1 through stage 4 chronic kidney disease, or unspecified chronic kidney disease: Secondary | ICD-10-CM | POA: Diagnosis not present

## 2021-11-14 DIAGNOSIS — M1990 Unspecified osteoarthritis, unspecified site: Secondary | ICD-10-CM | POA: Diagnosis not present

## 2021-11-14 DIAGNOSIS — N390 Urinary tract infection, site not specified: Secondary | ICD-10-CM | POA: Diagnosis not present

## 2021-12-06 ENCOUNTER — Other Ambulatory Visit: Payer: Self-pay | Admitting: Family Medicine

## 2022-02-08 DIAGNOSIS — M25561 Pain in right knee: Secondary | ICD-10-CM | POA: Diagnosis not present

## 2022-03-11 ENCOUNTER — Other Ambulatory Visit: Payer: Self-pay | Admitting: Family Medicine

## 2022-03-28 ENCOUNTER — Telehealth: Payer: Self-pay | Admitting: Family Medicine

## 2022-03-28 DIAGNOSIS — N1832 Chronic kidney disease, stage 3b: Secondary | ICD-10-CM

## 2022-03-28 DIAGNOSIS — R7303 Prediabetes: Secondary | ICD-10-CM

## 2022-03-28 NOTE — Telephone Encounter (Signed)
Pt labs ordered

## 2022-03-29 ENCOUNTER — Other Ambulatory Visit: Payer: Medicare Other

## 2022-03-29 DIAGNOSIS — N1832 Chronic kidney disease, stage 3b: Secondary | ICD-10-CM | POA: Diagnosis not present

## 2022-03-29 DIAGNOSIS — R7303 Prediabetes: Secondary | ICD-10-CM

## 2022-03-29 LAB — BAYER DCA HB A1C WAIVED: HB A1C (BAYER DCA - WAIVED): 6 % — ABNORMAL HIGH (ref 4.8–5.6)

## 2022-03-30 LAB — CBC WITH DIFFERENTIAL/PLATELET
Basophils Absolute: 0.1 10*3/uL (ref 0.0–0.2)
Basos: 1 %
EOS (ABSOLUTE): 0.2 10*3/uL (ref 0.0–0.4)
Eos: 3 %
Hematocrit: 37.8 % (ref 34.0–46.6)
Hemoglobin: 12.7 g/dL (ref 11.1–15.9)
Immature Grans (Abs): 0 10*3/uL (ref 0.0–0.1)
Immature Granulocytes: 0 %
Lymphocytes Absolute: 1.7 10*3/uL (ref 0.7–3.1)
Lymphs: 32 %
MCH: 29.8 pg (ref 26.6–33.0)
MCHC: 33.6 g/dL (ref 31.5–35.7)
MCV: 89 fL (ref 79–97)
Monocytes Absolute: 0.6 10*3/uL (ref 0.1–0.9)
Monocytes: 11 %
Neutrophils Absolute: 2.8 10*3/uL (ref 1.4–7.0)
Neutrophils: 53 %
Platelets: 226 10*3/uL (ref 150–450)
RBC: 4.26 x10E6/uL (ref 3.77–5.28)
RDW: 12.7 % (ref 11.7–15.4)
WBC: 5.3 10*3/uL (ref 3.4–10.8)

## 2022-03-30 LAB — CMP14+EGFR
ALT: 15 [IU]/L (ref 0–32)
AST: 22 [IU]/L (ref 0–40)
Albumin/Globulin Ratio: 1.6 (ref 1.2–2.2)
Albumin: 4.2 g/dL (ref 3.7–4.7)
Alkaline Phosphatase: 48 [IU]/L (ref 44–121)
BUN/Creatinine Ratio: 15 (ref 12–28)
BUN: 19 mg/dL (ref 8–27)
Bilirubin Total: 0.5 mg/dL (ref 0.0–1.2)
CO2: 27 mmol/L (ref 20–29)
Calcium: 9.8 mg/dL (ref 8.7–10.3)
Chloride: 96 mmol/L (ref 96–106)
Creatinine, Ser: 1.28 mg/dL — ABNORMAL HIGH (ref 0.57–1.00)
Globulin, Total: 2.7 g/dL (ref 1.5–4.5)
Glucose: 97 mg/dL (ref 70–99)
Potassium: 4.6 mmol/L (ref 3.5–5.2)
Sodium: 135 mmol/L (ref 134–144)
Total Protein: 6.9 g/dL (ref 6.0–8.5)
eGFR: 42 mL/min/{1.73_m2} — ABNORMAL LOW

## 2022-03-30 LAB — LIPID PANEL
Chol/HDL Ratio: 3.7 ratio (ref 0.0–4.4)
Cholesterol, Total: 162 mg/dL (ref 100–199)
HDL: 44 mg/dL
LDL Chol Calc (NIH): 94 mg/dL (ref 0–99)
Triglycerides: 137 mg/dL (ref 0–149)
VLDL Cholesterol Cal: 24 mg/dL (ref 5–40)

## 2022-04-05 ENCOUNTER — Encounter: Payer: Self-pay | Admitting: Family Medicine

## 2022-04-05 ENCOUNTER — Ambulatory Visit (INDEPENDENT_AMBULATORY_CARE_PROVIDER_SITE_OTHER): Payer: Medicare Other | Admitting: Family Medicine

## 2022-04-05 VITALS — BP 135/73 | HR 68 | Temp 98.1°F | Ht 63.0 in | Wt 146.2 lb

## 2022-04-05 DIAGNOSIS — E7800 Pure hypercholesterolemia, unspecified: Secondary | ICD-10-CM | POA: Diagnosis not present

## 2022-04-05 DIAGNOSIS — R195 Other fecal abnormalities: Secondary | ICD-10-CM | POA: Diagnosis not present

## 2022-04-05 DIAGNOSIS — Z23 Encounter for immunization: Secondary | ICD-10-CM | POA: Diagnosis not present

## 2022-04-05 DIAGNOSIS — E663 Overweight: Secondary | ICD-10-CM

## 2022-04-05 DIAGNOSIS — R7303 Prediabetes: Secondary | ICD-10-CM

## 2022-04-05 DIAGNOSIS — N1832 Chronic kidney disease, stage 3b: Secondary | ICD-10-CM | POA: Diagnosis not present

## 2022-04-05 NOTE — Patient Instructions (Addendum)
Flu shots will be available in October.  Please schedule your flu shot.  You do not need medication for your bones.  Your last bone density scan was NORMAL, so no medication is needed.  I'm not sure why they sent you that letter.

## 2022-04-05 NOTE — Progress Notes (Signed)
Subjective: CC:38mf/u PCP: GJanora Norlander DO HKYH:CWCBJW WLecountis a 81y.o. female presenting to clinic today for:  Hypertension, hyperlipidemia and prediabetes associated with CKD 3B and overweight BMI Patient had fasting labs performed which showed A1c of 6.0, unchanged compared to previous checkup.  Cholesterol showed about a 20 point rise in LDL and about a 17 point rise in triglycerides since her previous checkup.  GFR was slightly improved at 42 mL/min and creatinine down to 1.28.  Electrolytes were all normal.  There was no anemia.  She wants to know today if she needs to take a daily aspirin or not.  She also notes that she has been having some dark stools and wonders if FOBT is appropriate for her.  She notes that she has inflammation in her knees and wonders if prednisone would be an appropriate treatment for her.  She has corticosteroid injection scheduled for next month and just had corticosteroid injections done 2 months ago.  Unable to take oral NSAIDs secondary to CKD.  ROS: Per HPI  No Known Allergies Past Medical History:  Diagnosis Date   ASCUS (atypical squamous cells of undetermined significance) on Pap smear 2011x2   Negative high-risk HPV, normal Pap smears 2012/2013   Atypical nevus 05/02/2007   right back-moderate    Cancer ([place]    Cataract    Diverticulosis    GERD (gastroesophageal reflux disease)    Hiatal hernia    Hyperlipemia    Hypertension    IBS (irritable bowel syndrome)    Kidney disease    Lymphocytic colitis 2007   Neuropathy    SCC (squamous cell carcinoma) 01/19/2006   bridge of nose (CX35FU)   SCC (squamous cell carcinoma) 03/08/2017   right inner lower shin (CX35FU)   SCC (squamous cell carcinoma) x 2 02/24/2002   right bridge of nose (Cx35FU),right bridge of nose   Uterine prolapse    Pessary    Current Outpatient Medications:    Alpha-Lipoic Acid 600 MG CAPS, Take by mouth., Disp: , Rfl:    aspirin 81 MG tablet, Take 81  mg by mouth daily. (Patient not taking: Reported on 08/09/2021), Disp: , Rfl:    atorvastatin (LIPITOR) 40 MG tablet, Take 1 tablet (40 mg total) by mouth daily., Disp: 90 tablet, Rfl: 3   dexamethasone (DECADRON) 2 MG tablet, Take 4 tablets for 2 days then 3 tablets for 2 days then 2 tablets for 2 days then 1 tablet for 2 days, Disp: 20 tablet, Rfl: 0   enalapril (VASOTEC) 10 MG tablet, Take 1 tablet (10 mg total) by mouth 2 (two) times daily., Disp: 180 tablet, Rfl: 3   famotidine (PEPCID) 20 MG tablet, Take 1 tablet (20 mg total) by mouth daily., Disp: 90 tablet, Rfl: 3   fluticasone (FLONASE) 50 MCG/ACT nasal spray, Place 1 spray into both nostrils 2 (two) times daily as needed for allergies or rhinitis., Disp: 16 g, Rfl: 6   gabapentin (NEURONTIN) 100 MG capsule, Take 3 capsules (300 mg total) by mouth at bedtime., Disp: 270 capsule, Rfl: 4   Glucosamine-Chondroitin (GLUCOSAMINE CHONDR COMPLEX PO), Take 1 tablet by mouth 2 (two) times daily., Disp: , Rfl:    hydrochlorothiazide (HYDRODIURIL) 25 MG tablet, TAKE 1 TABLET BY MOUTH DAILY, Disp: 90 tablet, Rfl: 1   Multiple Vitamins-Minerals (MULTIVITAMIN ADULT PO), Take 1 tablet by mouth daily., Disp: , Rfl:    Omega-3 Fatty Acids (FISH OIL) 1000 MG CAPS, Take 1 capsule by mouth daily.,  Disp: , Rfl:    saccharomyces boulardii (FLORASTOR) 250 MG capsule, Take 250 mg by mouth 2 (two) times daily., Disp: , Rfl:    vitamin B-12 (CYANOCOBALAMIN) 1000 MCG tablet, Take 1,000 mcg by mouth daily., Disp: , Rfl:    vitamin B-12 (CYANOCOBALAMIN) 500 MCG tablet, Take 500 mcg by mouth daily., Disp: , Rfl:    VITAMIN D PO, Take by mouth., Disp: , Rfl:  Social History   Socioeconomic History   Marital status: Married    Spouse name: Not on file   Number of children: 3    Years of education: Not on file   Highest education level: 8th grade  Occupational History   Occupation: Retired   Tobacco Use   Smoking status: Never   Smokeless tobacco: Never  Vaping  Use   Vaping Use: Never used  Substance and Sexual Activity   Alcohol use: Yes    Alcohol/week: 0.0 standard drinks of alcohol    Comment: Rare   Drug use: No   Sexual activity: Yes    Birth control/protection: Post-menopausal    Comment: 1st intercourse 89 yo--1 partner  Other Topics Concern   Not on file  Social History Narrative   Lives at home with husband.   Right-handed.   Caffeine use: one cup caffeine some days.   Social Determinants of Health   Financial Resource Strain: Low Risk  (10/09/2018)   Overall Financial Resource Strain (CARDIA)    Difficulty of Paying Living Expenses: Not hard at all  Food Insecurity: No Food Insecurity (10/09/2018)   Hunger Vital Sign    Worried About Running Out of Food in the Last Year: Never true    Ran Out of Food in the Last Year: Never true  Transportation Needs: No Transportation Needs (10/09/2018)   PRAPARE - Hydrologist (Medical): No    Lack of Transportation (Non-Medical): No  Physical Activity: Insufficiently Active (10/09/2018)   Exercise Vital Sign    Days of Exercise per Week: 2 days    Minutes of Exercise per Session: 30 min  Stress: No Stress Concern Present (10/09/2018)   [place]    Feeling of Stress : Not at all  Social Connections: Moderately Integrated (10/09/2018)   Social Connection and Isolation Panel [NHANES]    Frequency of Communication with Friends and Family: More than three times a week    Frequency of Social Gatherings with Friends and Family: More than three times a week    Attends Religious Services: More than 4 times per year    Active Member of [employer] or Organizations: No    Attends Archivist Meetings: Never    Marital Status: Married  Human resources officer Violence: Not At Risk (10/09/2018)   Humiliation, Afraid, Rape, and Kick questionnaire    Fear of Current or Ex-Partner: No    Emotionally Abused: No     Physically Abused: No    Sexually Abused: No   Family History  Problem Relation Age of Onset   Heart disease Mother    Diabetes Brother        BKA   COPD Father    Asthma Father    Alzheimer's disease Sister    Alzheimer's disease Brother    Multiple sclerosis Sister    Heart disease Sister    Diabetes Brother    Early death Brother        died in war   Colon  cancer Neg Hx    Kidney disease Neg Hx    Liver disease Neg Hx     Objective: Office vital signs reviewed. BP 135/73   Pulse 68   Temp 98.1 F (36.7 C)   Ht '5\' 3"'$  (1.6 m)   Wt 146 lb 3.2 oz (66.3 kg)   SpO2 93%   BMI 25.90 kg/m   Physical Examination:  General: Awake, alert, well nourished, No acute distress HEENT: Sclera white.  Mucous membranes.  TMs intact bilaterally.  No lymphadenopathy or masses appreciated Cardio: regular rate and rhythm, S1S2 heard, no murmurs appreciated Pulm: clear to auscultation bilaterally, no wheezes, rhonchi or rales; normal work of breathing on room air  Assessment/ Plan: 81 y.o. female   Stage 3b chronic kidney disease ([place])  Pure hypercholesterolemia  Pre-diabetes  Overweight (BMI 25.0-29.9)  Dark stools - Plan: Fecal occult blood, imunochemical  Renal disease is chronic and slightly better than previous checkup.  Will CC labs to nephrologist  Discussed rise in lipids and advised to watch diet  A1c unchanged from previous.  Again carb reduction is recommended  Fecal occult blood test ordered given reports of dark stools.  If positive for blood, low threshold to refer to GI  Shingles vaccination administered today  No orders of the defined types were placed in this encounter.  No orders of the defined types were placed in this encounter.    [name], DO [place] [phone]

## 2022-04-13 ENCOUNTER — Telehealth: Payer: Self-pay | Admitting: Family Medicine

## 2022-04-13 MED ORDER — GABAPENTIN 100 MG PO CAPS: 300.0000 mg | ORAL_CAPSULE | Freq: Every day | ORAL | 1 refills | Status: DC

## 2022-04-13 MED ORDER — ENALAPRIL MALEATE 10 MG PO TABS: 10.0000 mg | ORAL_TABLET | Freq: Two times a day (BID) | ORAL | 1 refills | Status: DC

## 2022-04-13 MED ORDER — ATORVASTATIN CALCIUM 40 MG PO TABS: 40.0000 mg | ORAL_TABLET | Freq: Every day | ORAL | 1 refills | Status: DC

## 2022-04-13 NOTE — Telephone Encounter (Signed)
Patient aware that rx sent to pharmacy. 

## 2022-04-13 NOTE — Telephone Encounter (Signed)
  Prescription Request  04/13/2022  Is this a "Controlled Substance" medicine? no  Have you seen your PCP in the last 2 weeks? No   If YES, route message to pool  -  If NO, patient needs to be scheduled for appointment.  What is the name of the medication or equipment? Gabapentin 100 mg, Enalapril 10 mg, Atorvastatin 40 mg. Told nurse at her appt 8-9 she  needed and hadn't got them yet  Have you contacted your pharmacy to request a refill? no   Which pharmacy would you like this sent to? [facility]   Patient notified that their request is being sent to the clinical staff for review and that they should receive a response within 2 business days.

## 2022-04-25 DIAGNOSIS — H00012 Hordeolum externum right lower eyelid: Secondary | ICD-10-CM | POA: Diagnosis not present

## 2022-04-25 DIAGNOSIS — H02885 Meibomian gland dysfunction left lower eyelid: Secondary | ICD-10-CM | POA: Diagnosis not present

## 2022-04-25 DIAGNOSIS — H26491 Other secondary cataract, right eye: Secondary | ICD-10-CM | POA: Diagnosis not present

## 2022-04-25 DIAGNOSIS — Z961 Presence of intraocular lens: Secondary | ICD-10-CM | POA: Diagnosis not present

## 2022-04-25 DIAGNOSIS — H02882 Meibomian gland dysfunction right lower eyelid: Secondary | ICD-10-CM | POA: Diagnosis not present

## 2022-05-08 DIAGNOSIS — Z961 Presence of intraocular lens: Secondary | ICD-10-CM | POA: Diagnosis not present

## 2022-05-08 DIAGNOSIS — H26491 Other secondary cataract, right eye: Secondary | ICD-10-CM | POA: Diagnosis not present

## 2022-05-17 DIAGNOSIS — M170 Bilateral primary osteoarthritis of knee: Secondary | ICD-10-CM | POA: Diagnosis not present

## 2022-05-17 DIAGNOSIS — H26491 Other secondary cataract, right eye: Secondary | ICD-10-CM | POA: Diagnosis not present

## 2022-05-17 DIAGNOSIS — Z961 Presence of intraocular lens: Secondary | ICD-10-CM | POA: Diagnosis not present

## 2022-06-05 DIAGNOSIS — N1831 Chronic kidney disease, stage 3a: Secondary | ICD-10-CM | POA: Diagnosis not present

## 2022-06-05 DIAGNOSIS — I129 Hypertensive chronic kidney disease with stage 1 through stage 4 chronic kidney disease, or unspecified chronic kidney disease: Secondary | ICD-10-CM | POA: Diagnosis not present

## 2022-06-05 DIAGNOSIS — M1990 Unspecified osteoarthritis, unspecified site: Secondary | ICD-10-CM | POA: Diagnosis not present

## 2022-06-05 DIAGNOSIS — R7303 Prediabetes: Secondary | ICD-10-CM | POA: Diagnosis not present

## 2022-06-05 DIAGNOSIS — E785 Hyperlipidemia, unspecified: Secondary | ICD-10-CM | POA: Diagnosis not present

## 2022-06-05 DIAGNOSIS — D472 Monoclonal gammopathy: Secondary | ICD-10-CM | POA: Diagnosis not present

## 2022-06-05 DIAGNOSIS — K219 Gastro-esophageal reflux disease without esophagitis: Secondary | ICD-10-CM | POA: Diagnosis not present

## 2022-06-10 ENCOUNTER — Other Ambulatory Visit: Payer: Self-pay | Admitting: Family Medicine

## 2022-06-15 ENCOUNTER — Other Ambulatory Visit: Payer: Medicare Other

## 2022-06-15 DIAGNOSIS — R195 Other fecal abnormalities: Secondary | ICD-10-CM | POA: Diagnosis not present

## 2022-06-16 LAB — FECAL OCCULT BLOOD, IMMUNOCHEMICAL: Fecal Occult Bld: NEGATIVE

## 2022-06-17 ENCOUNTER — Other Ambulatory Visit: Payer: Self-pay | Admitting: Family Medicine

## 2022-06-23 ENCOUNTER — Ambulatory Visit (INDEPENDENT_AMBULATORY_CARE_PROVIDER_SITE_OTHER): Payer: Medicare Other

## 2022-06-23 DIAGNOSIS — Z23 Encounter for immunization: Secondary | ICD-10-CM

## 2022-08-08 ENCOUNTER — Encounter: Payer: Self-pay | Admitting: Obstetrics and Gynecology

## 2022-08-08 ENCOUNTER — Ambulatory Visit: Payer: Medicare Other | Admitting: Obstetrics and Gynecology

## 2022-08-08 ENCOUNTER — Other Ambulatory Visit (HOSPITAL_COMMUNITY)
Admission: RE | Admit: 2022-08-08 | Discharge: 2022-08-08 | Disposition: A | Discharge status: Home / Self Care | Payer: Medicare Other | Source: Ambulatory Visit | Attending: Obstetrics and Gynecology | Admitting: Obstetrics and Gynecology

## 2022-08-08 ENCOUNTER — Telehealth: Payer: Self-pay | Admitting: Obstetrics and Gynecology

## 2022-08-08 VITALS — BP 122/78 | HR 74 | Ht 63.0 in | Wt 144.0 lb

## 2022-08-08 DIAGNOSIS — N814 Uterovaginal prolapse, unspecified: Secondary | ICD-10-CM

## 2022-08-08 DIAGNOSIS — Z1151 Encounter for screening for human papillomavirus (HPV): Secondary | ICD-10-CM | POA: Diagnosis not present

## 2022-08-08 DIAGNOSIS — Z01419 Encounter for gynecological examination (general) (routine) without abnormal findings: Secondary | ICD-10-CM | POA: Diagnosis not present

## 2022-08-08 DIAGNOSIS — Z4689 Encounter for fitting and adjustment of other specified devices: Secondary | ICD-10-CM | POA: Diagnosis not present

## 2022-08-08 DIAGNOSIS — N812 Incomplete uterovaginal prolapse: Secondary | ICD-10-CM

## 2022-08-08 DIAGNOSIS — N898 Other specified noninflammatory disorders of vagina: Secondary | ICD-10-CM

## 2022-08-08 DIAGNOSIS — Z124 Encounter for screening for malignant neoplasm of cervix: Secondary | ICD-10-CM | POA: Diagnosis present

## 2022-08-08 DIAGNOSIS — R350 Frequency of micturition: Secondary | ICD-10-CM

## 2022-08-08 NOTE — Progress Notes (Signed)
GYNECOLOGY  VISIT   HPI: 81 y.o.   Married  Caucasian  female   [phone] with No LMP recorded. Patient is postmenopausal.  Here for  pessary check, possible fitting, vaginal discharge, urinary frequency.  Her daughter is present for a portion of the visit today.  Former patient of Dr. Phineas Real, last seen 03/10/19.  She has  a cystocele, rectocele and uterine prolapse.  She has a 2 3/4 inch ring pessary ring with support that she has not used for 2 years.  She has an increase in her prolapse when she is more active.  No vaginal bleeding.  Cannot hold her urine well.  Voiding with some hesitation.   Able to have bowel movements.  Has colitis.   Some vaginal burning.  No dysuria.   She would like a breast and pelvic exam with a pap today.  GYNECOLOGIC HISTORY: No LMP recorded. Patient is postmenopausal. Contraception:  postmenopausal Menopausal hormone therapy:  n/a BI-RADS CATEGORY  1: Negative.  Last mammogram:  2018. ACR Breast Density Category b  Last pap smear:   03/10/2019 neg        OB History     Gravida  3   Para  3   Term  3   Preterm      AB      Living  3      SAB      IAB      Ectopic      Multiple      Live Births                 Patient Active Problem List   Diagnosis Date Noted   Thrush 09/24/2020   Polyneuropathy associated with underlying disease ([place]) 04/26/2020   Gait abnormality 04/26/2020   Paresthesia 03/11/2020   Chronic bilateral low back pain with right-sided sciatica 03/11/2020   Incontinence of feces 03/11/2020   Stage III chronic kidney disease ([place]) 10/31/2017   Diarrhea 06/19/2016   Pre-diabetes 06/01/2014   Renal insufficiency 11/18/2013   Osteoarthritis of left knee 11/18/2013   HTN (hypertension) 06/26/2013   Microscopic colitis 10/30/2012   Hyperlipidemia 10/30/2012   Unspecified constipation 07/10/2011   Dysphagia, unspecified(787.20) 07/10/2011   Diverticulosis of colon (without mention of hemorrhage)  07/10/2011   Special screening for malignant neoplasms, colon 07/10/2011   GERD with stricture 07/10/2011    Past Medical History:  Diagnosis Date   ASCUS (atypical squamous cells of undetermined significance) on Pap smear 2011x2   Negative high-risk HPV, normal Pap smears 2012/2013   Atypical nevus 05/02/2007   right back-moderate    Cancer ([place])    Cataract    Diverticulosis    GERD (gastroesophageal reflux disease)    Hiatal hernia    Hyperlipemia    Hypertension    IBS (irritable bowel syndrome)    Kidney disease    Lymphocytic colitis 2007   Neuropathy    SCC (squamous cell carcinoma) 01/19/2006   bridge of nose (CX35FU)   SCC (squamous cell carcinoma) 03/08/2017   right inner lower shin (CX35FU)   SCC (squamous cell carcinoma) x 2 02/24/2002   right bridge of nose (Cx35FU),right bridge of nose   Uterine prolapse    Pessary    Past Surgical History:  Procedure Laterality Date   BREAST BIOPSY     unsure which breast   CATARACT EXTRACTION W/ INTRAOCULAR LENS IMPLANT Bilateral     Current Outpatient Medications  Medication Sig Dispense Refill   Alpha-Lipoic Acid  600 MG CAPS Take by mouth.     enalapril (VASOTEC) 10 MG tablet TAKE 1 TABLET BY MOUTH TWICE  DAILY 200 tablet 0   famotidine (PEPCID) 20 MG tablet Take 1 tablet (20 mg total) by mouth daily. 90 tablet 3   fluticasone (FLONASE) 50 MCG/ACT nasal spray Place 1 spray into both nostrils 2 (two) times daily as needed for allergies or rhinitis. 16 g 6   gabapentin (NEURONTIN) 100 MG capsule TAKE 3 CAPSULES BY MOUTH AT  BEDTIME 300 capsule 0   Glucosamine-Chondroitin (GLUCOSAMINE CHONDR COMPLEX PO) Take 1 tablet by mouth 2 (two) times daily.     hydrochlorothiazide (HYDRODIURIL) 25 MG tablet TAKE 1 TABLET BY MOUTH DAILY 100 tablet 0   Multiple Vitamins-Minerals (MULTIVITAMIN ADULT PO) Take 1 tablet by mouth daily.     Omega-3 Fatty Acids (FISH OIL) 1000 MG CAPS Take 1 capsule by mouth daily.     saccharomyces  boulardii (FLORASTOR) 250 MG capsule Take 250 mg by mouth 2 (two) times daily.     vitamin B-12 (CYANOCOBALAMIN) 1000 MCG tablet Take 1,000 mcg by mouth daily.     vitamin B-12 (CYANOCOBALAMIN) 500 MCG tablet Take 500 mcg by mouth daily.     VITAMIN D PO Take by mouth.     atorvastatin (LIPITOR) 40 MG tablet TAKE 1 TABLET BY MOUTH DAILY (Patient not taking: Reported on 08/08/2022) 100 tablet 0   No current facility-administered medications for this visit.     ALLERGIES: Patient has no known allergies.  Family History  Problem Relation Age of Onset   Heart disease Mother    Diabetes Brother        BKA   COPD Father    Asthma Father    Alzheimer's disease Sister    Alzheimer's disease Brother    Multiple sclerosis Sister    Heart disease Sister    Diabetes Brother    Early death Brother        died in war   Colon cancer Neg Hx    Kidney disease Neg Hx    Liver disease Neg Hx     Social History   Socioeconomic History   Marital status: Married    Spouse name: Not on file   Number of children: 3    Years of education: Not on file   Highest education level: 8th grade  Occupational History   Occupation: Retired   Tobacco Use   Smoking status: Never   Smokeless tobacco: Never  Vaping Use   Vaping Use: Never used  Substance and Sexual Activity   Alcohol use: Not Currently   Drug use: No   Sexual activity: Not on file    Comment: 1st intercourse 32 yo--1 partner  Other Topics Concern   Not on file  Social History Narrative   Lives at home with husband.   Right-handed.   Caffeine use: one cup caffeine some days.   Social Determinants of Health   Financial Resource Strain: Low Risk  (10/09/2018)   Overall Financial Resource Strain (CARDIA)    Difficulty of Paying Living Expenses: Not hard at all  Food Insecurity: No Food Insecurity (10/09/2018)   Hunger Vital Sign    Worried About Running Out of Food in the Last Year: Never true    Ran Out of Food in the Last Year:  Never true  Transportation Needs: No Transportation Needs (10/09/2018)   PRAPARE - Transportation    Lack of Transportation (Medical): No    Lack of  Transportation (Non-Medical): No  Physical Activity: Insufficiently Active (10/09/2018)   Exercise Vital Sign    Days of Exercise per Week: 2 days    Minutes of Exercise per Session: 30 min  Stress: No Stress Concern Present (10/09/2018)   [place]    Feeling of Stress : Not at all  Social Connections: Moderately Integrated (10/09/2018)   Social Connection and Isolation Panel [NHANES]    Frequency of Communication with Friends and Family: More than three times a week    Frequency of Social Gatherings with Friends and Family: More than three times a week    Attends Religious Services: More than 4 times per year    Active Member of [employer] or Organizations: No    Attends Archivist Meetings: Never    Marital Status: Married  Human resources officer Violence: Not At Risk (10/09/2018)   Humiliation, Afraid, Rape, and Kick questionnaire    Fear of Current or Ex-Partner: No    Emotionally Abused: No    Physically Abused: No    Sexually Abused: No    Review of Systems  Genitourinary:  Positive for frequency and vaginal discharge.    PHYSICAL EXAMINATION:    BP 122/78 (BP Location: Right Arm, Patient Position: Sitting, Cuff Size: Normal)   Pulse 74   Ht '5\' 3"'$  (1.6 m)   Wt 144 lb (65.3 kg)   BMI 25.51 kg/m     General appearance: alert, cooperative and appears stated age Breasts: normal appearance, no masses or tenderness, No nipple retraction or dimpling, No nipple discharge or bleeding, No axillary or supraclavicular adenopathy Abdomen: soft, non-tender, no masses,  no organomegaly   Pelvic: External genitalia:  no lesions              Urethra:  normal appearing urethra with no masses, tenderness or lesions              Bartholins and Skenes: normal                  Vagina: normal appearing vagina with normal color and discharge, no lesions              Cervix: no lesions                Bimanual Exam:  Uterus:  normal size, contour, position, consistency, mobility, non-tender.  Second degree uterine prolapse, third degree cystocele, and first degree rectocele.              Adnexa: no mass, fullness, tenderness              Rectal exam: yes.  Confirms.              Anus:  normal sphincter tone, no lesions  Pessary fitting:  #5 and #6 ring with support uncomfortable for the patient and is rotating to the side.  Gelhorn 2 1/2 inch also uncomfortable but does hold the prolapse.    Chaperone was present for exam:  Kimalexis  ASSESSMENT  Encounter for breast and pelvic exam.  Cervical cancer screening.  Urinary frequency.  Vaginal discharge.  Incomplete uterovaginal prolapse.  Pessary fitting encounter.   PLAN  Pap and HR HPV collected.  Mammogram recommended. Urinalysis:  sg 1.015, ph 5.5, 6 - 10 WBC, NS RBC, 0 - 5 epis, few bacteria. Urine culture sent.  Vaginitis testing with Nuswab.  Referral for urogynecology consultation.    An After Visit Summary was printed and given  to the patient.  45 min total time was spent for this patient encounter, including preparation, face-to-face counseling with the patient, coordination of care, and documentation of the encounter in addition to performing the breast and pelvic exam.

## 2022-08-08 NOTE — Telephone Encounter (Signed)
Please make referral to Dr.  Sherlene Shams for uterovaginal prolapse.

## 2022-08-08 NOTE — Telephone Encounter (Signed)
Referral sent 

## 2022-08-10 LAB — URINALYSIS, COMPLETE W/RFL CULTURE
Bilirubin Urine: NEGATIVE
Casts: NONE SEEN /LPF
Crystals: NONE SEEN /HPF
Glucose, UA: NEGATIVE
Hyaline Cast: NONE SEEN /LPF
Ketones, ur: NEGATIVE
Nitrites, Initial: NEGATIVE
Protein, ur: NEGATIVE
RBC / HPF: NONE SEEN /HPF (ref 0–2)
Specific Gravity, Urine: 1.015 (ref 1.001–1.035)
pH: 5.5 (ref 5.0–8.0)

## 2022-08-10 LAB — URINE CULTURE
MICRO NUMBER:: 14303241
SPECIMEN QUALITY:: ADEQUATE

## 2022-08-10 LAB — CERVICOVAGINAL ANCILLARY ONLY
Bacterial Vaginitis (gardnerella): NEGATIVE
Candida Glabrata: NEGATIVE
Candida Vaginitis: NEGATIVE
Comment: NEGATIVE
Comment: NEGATIVE
Comment: NEGATIVE
Comment: NEGATIVE
Trichomonas: NEGATIVE

## 2022-08-10 LAB — CULTURE INDICATED

## 2022-08-11 LAB — CYTOLOGY - PAP
Comment: NEGATIVE
Diagnosis: NEGATIVE
High risk HPV: NEGATIVE

## 2022-08-17 DIAGNOSIS — M25561 Pain in right knee: Secondary | ICD-10-CM | POA: Diagnosis not present

## 2022-08-17 DIAGNOSIS — M25562 Pain in left knee: Secondary | ICD-10-CM | POA: Diagnosis not present

## 2022-08-17 DIAGNOSIS — M170 Bilateral primary osteoarthritis of knee: Secondary | ICD-10-CM | POA: Diagnosis not present

## 2022-08-22 ENCOUNTER — Ambulatory Visit: Payer: Medicare Other

## 2022-08-24 ENCOUNTER — Ambulatory Visit (INDEPENDENT_AMBULATORY_CARE_PROVIDER_SITE_OTHER): Payer: Medicare Other | Admitting: *Deleted

## 2022-08-24 DIAGNOSIS — Z23 Encounter for immunization: Secondary | ICD-10-CM

## 2022-08-26 ENCOUNTER — Other Ambulatory Visit: Payer: Self-pay | Admitting: Family Medicine

## 2022-08-30 NOTE — Telephone Encounter (Signed)
FYI. Pt scheduled for 11/06/2022. Will close encounter.

## 2022-10-18 ENCOUNTER — Ambulatory Visit (INDEPENDENT_AMBULATORY_CARE_PROVIDER_SITE_OTHER): Payer: Medicare Other | Admitting: Family Medicine

## 2022-10-18 ENCOUNTER — Encounter: Payer: Self-pay | Admitting: Family Medicine

## 2022-10-18 VITALS — BP 140/47 | HR 68 | Temp 98.7°F | Ht 63.0 in | Wt 147.0 lb

## 2022-10-18 DIAGNOSIS — N1832 Chronic kidney disease, stage 3b: Secondary | ICD-10-CM | POA: Diagnosis not present

## 2022-10-18 DIAGNOSIS — R7303 Prediabetes: Secondary | ICD-10-CM | POA: Diagnosis not present

## 2022-10-18 DIAGNOSIS — Z0000 Encounter for general adult medical examination without abnormal findings: Secondary | ICD-10-CM

## 2022-10-18 DIAGNOSIS — E7800 Pure hypercholesterolemia, unspecified: Secondary | ICD-10-CM

## 2022-10-18 DIAGNOSIS — I129 Hypertensive chronic kidney disease with stage 1 through stage 4 chronic kidney disease, or unspecified chronic kidney disease: Secondary | ICD-10-CM | POA: Diagnosis not present

## 2022-10-18 DIAGNOSIS — Z23 Encounter for immunization: Secondary | ICD-10-CM

## 2022-10-18 DIAGNOSIS — Z0001 Encounter for general adult medical examination with abnormal findings: Secondary | ICD-10-CM

## 2022-10-18 LAB — LIPID PANEL

## 2022-10-18 LAB — BAYER DCA HB A1C WAIVED: HB A1C (BAYER DCA - WAIVED): 6.6 % — ABNORMAL HIGH (ref 4.8–5.6)

## 2022-10-18 NOTE — Progress Notes (Signed)
Kimberly Suarez is a 82 y.o. female presents to office today for annual physical exam examination.    Concerns today include: None.  She continues to get bilateral knee injections every 3 months.  Her right really bothers her more than her left but he injects both sides for her.  She admits that she really has not been following a strict diet and has been indulging in some sweets since her last visit.  She exercises regularly at least 2 times per week.  No reports of polydipsia, polyuria, visual disturbance.  Did require some laser therapy of the right eye due to some fogginess of that lens.  She has an appointment scheduled with her nephrologist in April and will be seeing a urogynecologist in March for her prolapsed uterus.  She tried using a pessary but it kept sliding out and she was not amenable to the one that they would have to take out every 3 months to maintain.  She also will be seeing a dermatologist soon for the various lesions that she has on her face and neck.  Has required previous excision of 1 at the tip of her nose.  Home blood pressures were running 115/79 this morning  Occupation: retired  Diet: Could use more vegetables, Exercise: 2 times per week Last eye exam: UTD Last dental exam: UTD sees someone in [place] Last colonoscopy: UTD Last mammogram: UTD Last pap smear: n/a Immunizations needed: Immunization History  Administered Date(s) Administered   Fluad Quad(high Dose 65+) 06/26/2019, 06/25/2020, 06/08/2021, 06/23/2022   Influenza, High Dose Seasonal PF 06/28/2015, 06/01/2016, 06/06/2017, 06/04/2018   Influenza,inj,Quad PF,6+ Mos 07/02/2013, 05/26/2014   Moderna Sars-Covid-2 Vaccination 12/23/2019, 01/20/2020, 09/07/2020   Pneumococcal Conjugate-13 07/02/2013   Pneumococcal Polysaccharide-23 01/25/2021   Tdap 05/29/2011   Zoster Recombinat (Shingrix) 04/05/2022, 08/24/2022     Past Medical History:  Diagnosis Date   ASCUS (atypical squamous cells of  undetermined significance) on Pap smear 2011x2   Negative high-risk HPV, normal Pap smears 2012/2013   Atypical nevus 05/02/2007   right back-moderate    Cancer ([place])    Cataract    Diverticulosis    GERD (gastroesophageal reflux disease)    Hiatal hernia    Hyperlipemia    Hypertension    IBS (irritable bowel syndrome)    Kidney disease    Lymphocytic colitis 2007   Neuropathy    SCC (squamous cell carcinoma) 01/19/2006   bridge of nose (CX35FU)   SCC (squamous cell carcinoma) 03/08/2017   right inner lower shin (CX35FU)   SCC (squamous cell carcinoma) x 2 02/24/2002   right bridge of nose (Cx35FU),right bridge of nose   Uterine prolapse    Pessary   Social History   Socioeconomic History   Marital status: Married    Spouse name: Not on file   Number of children: 3    Years of education: Not on file   Highest education level: 8th grade  Occupational History   Occupation: Retired   Tobacco Use   Smoking status: Never   Smokeless tobacco: Never  Vaping Use   Vaping Use: Never used  Substance and Sexual Activity   Alcohol use: Not Currently   Drug use: No   Sexual activity: Not on file    Comment: 1st intercourse 67 yo--1 partner  Other Topics Concern   Not on file  Social History Narrative   Lives at home with husband.   Right-handed.   Caffeine use: one cup caffeine some days.  Social Determinants of Health   Financial Resource Strain: Low Risk  (10/09/2018)   Overall Financial Resource Strain (CARDIA)    Difficulty of Paying Living Expenses: Not hard at all  Food Insecurity: No Food Insecurity (10/09/2018)   Hunger Vital Sign    Worried About Running Out of Food in the Last Year: Never true    Ran Out of Food in the Last Year: Never true  Transportation Needs: No Transportation Needs (10/09/2018)   PRAPARE - Hydrologist (Medical): No    Lack of Transportation (Non-Medical): No  Physical Activity: Insufficiently Active  (10/09/2018)   Exercise Vital Sign    Days of Exercise per Week: 2 days    Minutes of Exercise per Session: 30 min  Stress: No Stress Concern Present (10/09/2018)   [place]    Feeling of Stress : Not at all  Social Connections: Moderately Integrated (10/09/2018)   Social Connection and Isolation Panel [NHANES]    Frequency of Communication with Friends and Family: More than three times a week    Frequency of Social Gatherings with Friends and Family: More than three times a week    Attends Religious Services: More than 4 times per year    Active Member of [employer] or Organizations: No    Attends Archivist Meetings: Never    Marital Status: Married  Human resources officer Violence: Not At Risk (10/09/2018)   Humiliation, Afraid, Rape, and Kick questionnaire    Fear of Current or Ex-Partner: No    Emotionally Abused: No    Physically Abused: No    Sexually Abused: No   Past Surgical History:  Procedure Laterality Date   BREAST BIOPSY     unsure which breast   CATARACT EXTRACTION W/ INTRAOCULAR LENS IMPLANT Bilateral    Family History  Problem Relation Age of Onset   Heart disease Mother    Diabetes Brother        BKA   COPD Father    Asthma Father    Alzheimer's disease Sister    Alzheimer's disease Brother    Multiple sclerosis Sister    Heart disease Sister    Diabetes Brother    Early death Brother        died in war   Colon cancer Neg Hx    Kidney disease Neg Hx    Liver disease Neg Hx     Current Outpatient Medications:    Alpha-Lipoic Acid 600 MG CAPS, Take by mouth., Disp: , Rfl:    atorvastatin (LIPITOR) 40 MG tablet, TAKE 1 TABLET BY MOUTH DAILY, Disp: 100 tablet, Rfl: 0   enalapril (VASOTEC) 10 MG tablet, TAKE 1 TABLET BY MOUTH TWICE  DAILY, Disp: 200 tablet, Rfl: 0   famotidine (PEPCID) 20 MG tablet, Take 1 tablet (20 mg total) by mouth daily., Disp: 90 tablet, Rfl: 3   fluticasone  (FLONASE) 50 MCG/ACT nasal spray, Place 1 spray into both nostrils 2 (two) times daily as needed for allergies or rhinitis., Disp: 16 g, Rfl: 6   gabapentin (NEURONTIN) 100 MG capsule, TAKE 3 CAPSULES BY MOUTH AT  BEDTIME, Disp: 300 capsule, Rfl: 0   Glucosamine-Chondroitin (GLUCOSAMINE CHONDR COMPLEX PO), Take 1 tablet by mouth 2 (two) times daily., Disp: , Rfl:    hydrochlorothiazide (HYDRODIURIL) 25 MG tablet, TAKE 1 TABLET BY MOUTH DAILY, Disp: 100 tablet, Rfl: 0   Multiple Vitamins-Minerals (MULTIVITAMIN ADULT PO), Take 1 tablet by mouth  daily., Disp: , Rfl:    Omega-3 Fatty Acids (FISH OIL) 1000 MG CAPS, Take 1 capsule by mouth daily., Disp: , Rfl:    saccharomyces boulardii (FLORASTOR) 250 MG capsule, Take 250 mg by mouth 2 (two) times daily., Disp: , Rfl:    vitamin B-12 (CYANOCOBALAMIN) 1000 MCG tablet, Take 1,000 mcg by mouth daily., Disp: , Rfl:    vitamin B-12 (CYANOCOBALAMIN) 500 MCG tablet, Take 500 mcg by mouth daily., Disp: , Rfl:    VITAMIN D PO, Take by mouth., Disp: , Rfl:   No Known Allergies   ROS: Review of Systems Pertinent items noted in HPI and remainder of comprehensive ROS otherwise negative.    Physical exam BP (!) 144/74   Pulse 68   Temp 98.7 F (37.1 C)   Ht 5' 3"$  (1.6 m)   Wt 147 lb (66.7 kg)   SpO2 97%   BMI 26.04 kg/m  General appearance: alert, cooperative, appears stated age, and no distress Head: Normocephalic, without obvious abnormality, atraumatic Eyes: negative findings: lids and lashes normal, conjunctivae and sclerae normal, corneas clear, and pupils equal, round, reactive to light and accomodation Ears: normal TM's and external ear canals both ears Nose: Nares normal. Septum midline. Mucosa normal. No drainage or sinus tenderness. Throat: lips, mucosa, and tongue normal; teeth and gums normal Neck: no adenopathy, supple, symmetrical, trachea midline, and thyroid not enlarged, symmetric, no tenderness/mass/nodules Back: symmetric, no  curvature. ROM normal. No CVA tenderness. Lungs: clear to auscultation bilaterally Heart: regular rate and rhythm, S1, S2 normal, no murmur, click, rub or gallop Abdomen: soft, non-tender; bowel sounds normal; no masses,  no organomegaly Extremities: extremities normal, atraumatic, no cyanosis or edema Pulses: 2+ and symmetric Skin:  Multiple seborrheic keratoses noted along the face, chest and between the breasts Lymph nodes: Cervical, supraclavicular, and axillary nodes normal. Neurologic: Grossly normal Psych: Mood stable, speech normal, affect appropriate very pleasant and interactive.  [place] Office Visit from 10/18/2022 in [facility]  PHQ-2 Total Score 0      Assessment/ Plan: Kimberly Suarez here for annual physical exam.   Annual physical exam  Stage 3b chronic kidney disease ([place]) - Plan: CMP14+EGFR, CBC, VITAMIN D 25 Hydroxy (Vit-D Deficiency, Fractures)  Pre-diabetes - Plan: Bayer DCA Hb A1c Waived, Bayer DCA Hb A1c Waived  Pure hypercholesterolemia - Plan: CMP14+EGFR, Lipid Panel, TSH  Up-to-date on preventative health care.  Tetanus shot administered today.  A1c demonstrated rise from 6.0-6.6.  I am not going to label her as a type II diabetic just yet but we discussed that technically she is in this range and really needs to cut back on her carbohydrates.  Handout for dietary modification provided today.  I am going to see her closely in 3 months for repeat A1c and if persistently above 6.5, we discussed that we will need to proceed with diagnosis of type 2 diabetes and the other preventive healthcare measures that go along with that.  I do think this is a combination of lifestyle and recurrent corticosteroid injections to the knees.  I discussed with her that it would be to her benefit to not have that left knee injected if she is not having substantial pain because it is in fact impacting her blood sugar  Fasting lipid, renal  function and liver enzymes were obtained today.  Continue to follow-up with nephrology as directed.  Counseled on healthy lifestyle choices, including diet (rich in fruits, vegetables and lean meats and  low in salt and simple carbohydrates) and exercise (at least 30 minutes of moderate physical activity daily).  Patient to follow up in 1 year for annual exam or sooner if needed.  Vernell Back M. Lajuana Ripple, DO

## 2022-10-18 NOTE — Patient Instructions (Signed)

## 2022-10-19 LAB — CMP14+EGFR
ALT: 15 [IU]/L (ref 0–32)
AST: 23 [IU]/L (ref 0–40)
Albumin/Globulin Ratio: 1.5 (ref 1.2–2.2)
Albumin: 4.2 g/dL (ref 3.7–4.7)
Alkaline Phosphatase: 44 [IU]/L (ref 44–121)
BUN/Creatinine Ratio: 13 (ref 12–28)
BUN: 17 mg/dL (ref 8–27)
Bilirubin Total: 0.4 mg/dL (ref 0.0–1.2)
CO2: 25 mmol/L (ref 20–29)
Calcium: 9.5 mg/dL (ref 8.7–10.3)
Chloride: 99 mmol/L (ref 96–106)
Creatinine, Ser: 1.26 mg/dL — ABNORMAL HIGH (ref 0.57–1.00)
Globulin, Total: 2.8 g/dL (ref 1.5–4.5)
Glucose: 98 mg/dL (ref 70–99)
Potassium: 4.3 mmol/L (ref 3.5–5.2)
Sodium: 140 mmol/L (ref 134–144)
Total Protein: 7 g/dL (ref 6.0–8.5)
eGFR: 43 mL/min/{1.73_m2} — ABNORMAL LOW

## 2022-10-19 LAB — LIPID PANEL
Chol/HDL Ratio: 2.9 ratio (ref 0.0–4.4)
Cholesterol, Total: 145 mg/dL (ref 100–199)
HDL: 50 mg/dL
LDL Chol Calc (NIH): 72 mg/dL (ref 0–99)
Triglycerides: 130 mg/dL (ref 0–149)
VLDL Cholesterol Cal: 23 mg/dL (ref 5–40)

## 2022-10-19 LAB — CBC
Hematocrit: 40.7 % (ref 34.0–46.6)
Hemoglobin: 13.5 g/dL (ref 11.1–15.9)
MCH: 30.5 pg (ref 26.6–33.0)
MCHC: 33.2 g/dL (ref 31.5–35.7)
MCV: 92 fL (ref 79–97)
Platelets: 227 10*3/uL (ref 150–450)
RBC: 4.43 x10E6/uL (ref 3.77–5.28)
RDW: 12.5 % (ref 11.7–15.4)
WBC: 5.2 10*3/uL (ref 3.4–10.8)

## 2022-10-19 LAB — VITAMIN D 25 HYDROXY (VIT D DEFICIENCY, FRACTURES): Vit D, 25-Hydroxy: 56.3 ng/mL (ref 30.0–100.0)

## 2022-10-19 LAB — TSH: TSH: 2.04 u[IU]/mL (ref 0.450–4.500)

## 2022-11-04 ENCOUNTER — Other Ambulatory Visit: Payer: Self-pay | Admitting: Family Medicine

## 2022-11-06 ENCOUNTER — Ambulatory Visit: Payer: Medicare Other | Admitting: Obstetrics and Gynecology

## 2022-11-06 ENCOUNTER — Encounter: Payer: Self-pay | Admitting: Obstetrics and Gynecology

## 2022-11-06 VITALS — BP 139/72 | HR 67 | Ht 61.69 in | Wt 129.5 lb

## 2022-11-06 DIAGNOSIS — R159 Full incontinence of feces: Secondary | ICD-10-CM | POA: Diagnosis not present

## 2022-11-06 DIAGNOSIS — N3941 Urge incontinence: Secondary | ICD-10-CM | POA: Diagnosis not present

## 2022-11-06 DIAGNOSIS — R350 Frequency of micturition: Secondary | ICD-10-CM | POA: Diagnosis not present

## 2022-11-06 DIAGNOSIS — N952 Postmenopausal atrophic vaginitis: Secondary | ICD-10-CM | POA: Diagnosis not present

## 2022-11-06 DIAGNOSIS — N8110 Cystocele, unspecified: Secondary | ICD-10-CM

## 2022-11-06 DIAGNOSIS — N812 Incomplete uterovaginal prolapse: Secondary | ICD-10-CM

## 2022-11-06 LAB — POCT URINALYSIS DIPSTICK
Bilirubin, UA: NEGATIVE
Blood, UA: NEGATIVE
Glucose, UA: NEGATIVE
Ketones, UA: NEGATIVE
Nitrite, UA: NEGATIVE
Protein, UA: NEGATIVE
Spec Grav, UA: 1.02
Urobilinogen, UA: 0.2 U/dL
pH, UA: 6

## 2022-11-06 MED ORDER — ESTRADIOL 0.1 MG/GM VA CREA: TOPICAL_CREAM | VAGINAL | 11 refills | Status: DC

## 2022-11-06 NOTE — Patient Instructions (Addendum)
Accidental Bowel Leakage: Our goal is to achieve formed bowel movements daily or every-other-day without leakage.  You may need to try different combinations of the following options to find what works best for you.  Some management options include: Dietary changes (more leafy greens, vegetables and fruits; less processed foods) Fiber supplementation (Metamucil or something with psyllium as active ingredient) Over-the-counter imodium (tablets or liquid) to help solidify the stool and prevent leakage of stool. If you get constipated you can use Miralax as needed to achieve bowel movements.   Start vaginal estrogen therapy nightly for two weeks then 2 times weekly at night for treatment of vaginal atrophy (dryness of the vaginal tissues).  Please let us know if the prescription is too expensive and we can look for alternative options.    You have a stage 2 (out of 4) prolapse.  We discussed the fact that it is not life threatening but there are several treatment options. For treatment of pelvic organ prolapse, we discussed options for management including expectant management, conservative management, and surgical management, such as Kegels, a pessary, pelvic floor physical therapy, and specific surgical procedures.

## 2022-11-06 NOTE — Progress Notes (Addendum)
[facility] New Patient Evaluation and Consultation  Referring Provider: Aundria Rud* PCP: [name], DO Date of Service: 11/06/2022  SUBJECTIVE Chief Complaint: New Patient (Initial Visit) Kimberly Suarez is a 82 y.o. female here for a prolapse consult. And urinary urgency "sometimes")  History of Present Illness: Kimberly Suarez is a 82 y.o. White or Caucasian female seen in consultation at the request of Dr. Quincy Simmonds for evaluation of prolapse.    Reports previous pessaries did not hurt but they would not stay in. Reports she used to use vaginal estrogen but has not in years.   UUI> SUI  Review of records significant for: Has tried #5 and 6 rings and gellhorn pessaries but they were uncomfortable and would not hold back prolapse.   Urinary Symptoms: Leaks urine with going from sitting to standing, with a full bladder, and with movement to the bathroom Leaks 1 time(s) per day.  Pad use: 1 liners/ mini-pads per day.   She is not bothered by her UI symptoms.  Day time voids 7-8.  Nocturia: 1-2 times per night to void. Voiding dysfunction: she empties her bladder well.  does not use a catheter to empty bladder.  When urinating, she feels a weak stream Drinks: 1 cup coffee AM, 8 cups of water per day, occasional tea  UTIs:  0  UTI's in the last year.   Denies history of blood in urine and kidney or bladder stones  Pelvic Organ Prolapse Symptoms:                  Admits to a feeling of a bulge the vaginal area. It has been present for 10 years.  Admits to seeing a bulge.  This bulge is bothersome.  Bowel Symptom: Bowel movements: 1 time(s) per day Stool consistency: soft  Straining: no.  Splinting: no.  Incomplete evacuation: yes.  She Admits to accidental bowel leakage / fecal incontinence  Occurs: 4-5 time(s) per year  Consistency with leakage: soft  Bowel regimen: stool softener, probiotic Last colonoscopy: 2012  Sexual  Function Sexually active: no.    Pelvic Pain Denies pelvic pain   Past Medical History:  Past Medical History:  Diagnosis Date   ASCUS (atypical squamous cells of undetermined significance) on Pap smear 2011x2   Negative high-risk HPV, normal Pap smears 2012/2013   Atypical nevus 05/02/2007   right back-moderate    Cancer ([place])    Cataract    Diverticulosis    GERD (gastroesophageal reflux disease)    Hiatal hernia    Hyperlipemia    Hypertension    IBS (irritable bowel syndrome)    Kidney disease    Lymphocytic colitis 2007   Neuropathy    SCC (squamous cell carcinoma) 01/19/2006   bridge of nose (CX35FU)   SCC (squamous cell carcinoma) 03/08/2017   right inner lower shin (CX35FU)   SCC (squamous cell carcinoma) x 2 02/24/2002   right bridge of nose (Cx35FU),right bridge of nose   Uterine prolapse    Pessary     Past Surgical History:   Past Surgical History:  Procedure Laterality Date   BREAST BIOPSY     unsure which breast   CATARACT EXTRACTION W/ INTRAOCULAR LENS IMPLANT Bilateral      Past OB/GYN History: OB History  Gravida Para Term Preterm AB Living  '3 3 3     3  '$ SAB IAB Ectopic Multiple Live Births          3    #  Outcome Date GA Lbr Len/2nd Weight Sex Delivery Anes PTL Lv  3 Term           2 Term           1 Term             Vaginal deliveries: 3,  Forceps/ Vacuum deliveries: 0, Cesarean section: 0 Menopausal: Yes, Denies vaginal bleeding since menopause Last pap smear was 12/ 2023- negative.     Medications: She has a current medication list which includes the following prescription(s): alpha-lipoic acid, atorvastatin, enalapril, estradiol, famotidine, fluticasone, gabapentin, glucosamine-chondroitin, hydrochlorothiazide, multiple vitamin, fish oil, saccharomyces boulardii, cyanocobalamin, vitamin b-12, and vitamin d.   Allergies: Patient has No Known Allergies.   Social History:  Social History   Tobacco Use   Smoking status: Never    Smokeless tobacco: Never  Vaping Use   Vaping Use: Never used  Substance Use Topics   Alcohol use: Not Currently   Drug use: No    Relationship status: widowed She lives alone She is not employed. Regular exercise: Yes:   History of abuse: No  Family History:   Family History  Problem Relation Age of Onset   Heart disease Mother    Diabetes Brother        BKA   COPD Father    Asthma Father    Alzheimer's disease Sister    Alzheimer's disease Brother    Multiple sclerosis Sister    Heart disease Sister    Diabetes Brother    Early death Brother        died in war   Colon cancer Neg Hx    Kidney disease Neg Hx    Liver disease Neg Hx      Review of Systems: Review of Systems  Constitutional:  Negative for fever, malaise/fatigue and weight loss.  Respiratory:  Negative for cough, shortness of breath and wheezing.   Cardiovascular:  Negative for chest pain, palpitations and leg swelling.  Gastrointestinal:  Negative for abdominal pain and blood in stool.  Genitourinary:  Negative for dysuria.  Musculoskeletal:  Positive for myalgias.  Skin:  Negative for rash.  Neurological:  Negative for dizziness and headaches.  Endo/Heme/Allergies:  Bruises/bleeds easily.  Psychiatric/Behavioral:  Negative for depression. The patient is not nervous/anxious.      OBJECTIVE Physical Exam: Vitals:   11/06/22 1245 11/06/22 1304  BP: (!) 168/77 139/72  Pulse: 67 67  Weight: 129 lb 8 oz (58.7 kg)   Height: 5' 1.69" (1.567 m)     Physical Exam Constitutional:      General: She is not in acute distress. Pulmonary:     Effort: Pulmonary effort is normal.  Abdominal:     General: There is no distension.     Palpations: Abdomen is soft.     Tenderness: There is no abdominal tenderness. There is no rebound.  Musculoskeletal:        General: No swelling. Normal range of motion.  Skin:    General: Skin is warm and dry.     Findings: No rash.  Neurological:     Mental  Status: She is alert and oriented to person, place, and time.  Psychiatric:        Mood and Affect: Mood normal.        Behavior: Behavior normal.      GU / Detailed Urogynecologic Evaluation:  Pelvic Exam: Normal external female genitalia; Bartholin's and Skene's glands normal in appearance; urethral meatus normal in appearance, no urethral  masses or discharge.   CST: negative  Speculum exam reveals normal vaginal mucosa with atrophy. Cervix normal appearance. Uterus normal single, nontender. Adnexa no mass, fullness, tenderness.     Pelvic floor strength 0/V, puborectalis I/V external anal sphincter I/V  Pelvic floor musculature: Right levator non-tender, Right obturator non-tender, Left levator non-tender, Left obturator non-tender  POP-Q:   POP-Q  1                                            Aa   1                                           Ba  -5                                              C   5.5                                            Gh  3                                            Pb  8                                            tvl   -2                                            Ap  -2                                            Bp  -6.5                                              D      Rectal Exam:  Normal sphincter tone, small distal rectocele, enterocoele not present, no rectal masses, no sign of dyssynergia when asking the patient to bear down.  Post-Void Residual (PVR) by Bladder Scan: In order to evaluate bladder emptying, we discussed obtaining a postvoid residual and she agreed to this procedure.  Procedure: The ultrasound unit was placed on the patient's abdomen in the suprapubic region after the patient had voided. A PVR of 4 ml was obtained by bladder scan.  Laboratory Results: POC urine: trace leukocytes   ASSESSMENT AND PLAN Ms. Bunney is a 82 y.o. with:  1. Prolapse of anterior vaginal wall   2. Uterovaginal prolapse,  incomplete   3. Urge incontinence   4. Urinary frequency   5. Vaginal atrophy   6. Incontinence of feces, unspecified fecal incontinence type    Stage II anterior, Stage I posterior, Stage I apical prolapse - Patient with stage III anterior vaginal wall prolapse. She is bothered by the bulge. Discussed surgical options (Colpocleisis and sacrospinous ligament fixation) with patient and information given. She is possibly interested in colpocleisis.  - Tried two other pessary shapes without success. For now patient interested in a pessary fitting. Can try cube or donut pessary.   Urge Incontinence -  Patient reports she is not overly bothered by her urgency or OAB symptoms. She reports she can mostly manage this with going to the bathroom before her bladder is too full. Discussed with her that if she is interested in pursuing surgery we would do Urodynamics testing.   Vaginal atrophy - Patient reports she has used vaginal estrogen cream previously but it has been years. Discussed using a pea sized amount onto the finger every night for two weeks into the vagina and around the opening and then changing to use it twice weekly.   4. Fecal incontinence - only happens a few times a year - recommended daily fiber supplement such as metamucil for stool bulking  Patient to return for pessary fitting.   Jaquita Folds, MD

## 2022-11-20 DIAGNOSIS — M1711 Unilateral primary osteoarthritis, right knee: Secondary | ICD-10-CM | POA: Diagnosis not present

## 2022-11-26 NOTE — Progress Notes (Addendum)
 [facility]   Subjective:     Chief Complaint: Pessary fitting Kimberly Suarez is a 82 y.o. female is here for pessary fitting.)  History of Present Illness: Kimberly Suarez is a 82 y.o. female with stage III pelvic organ prolapse and OAB who presents today for a pessary fitting. Has tried multiple in the past, may need cube or donut.   Past Medical History: Patient  has a past medical history of ASCUS (atypical squamous cells of undetermined significance) on Pap smear (2011x2), Atypical nevus (05/02/2007), Cancer ([facility]), Cataract, Diverticulosis, GERD (gastroesophageal reflux disease), Hiatal hernia, Hyperlipemia, Hypertension, IBS (irritable bowel syndrome), Kidney disease, Lymphocytic colitis (2007), Neuropathy, SCC (squamous cell carcinoma) (01/19/2006), SCC (squamous cell carcinoma) (03/08/2017), SCC (squamous cell carcinoma) x 2 (02/24/2002), and Uterine prolapse.   Past Surgical History: She  has a past surgical history that includes Cataract extraction w/ intraocular lens implant (Bilateral) and Breast biopsy.   Medications: She has a current medication list which includes the following prescription(s): alpha-lipoic acid, atorvastatin , enalapril , estradiol , famotidine , fluticasone , gabapentin , glucosamine-chondroitin, hydrochlorothiazide , multiple vitamin, fish oil, saccharomyces boulardii, cyanocobalamin, vitamin b-12, and vitamin d .   Allergies: Patient has No Known Allergies.   Social History: Patient  reports that she has never smoked. She has never used smokeless tobacco. She reports that she does not currently use alcohol. She reports that she does not use drugs.      Objective:    BP 122/65   Pulse 70  Gen: No apparent distress, A&O x 3. Pelvic Exam: Normal external female genitalia; Bartholin's and Skene's glands normal in appearance; urethral meatus normal in appearance, no urethral masses or discharge.   Attempted a size 3Donut which was expelled  with cough Attempted a size 5 Dish which was expelled with cough  Unable to tolerate a size #4 Donut insertion  Attempted a size 4 Gellhorn which was not comfortable to patient.  Attempted a size #4 cube which stayed in place for a while but began to come down to the opening the longer she sat.   A size #5 cube pessary (Lot [id number]) was fitted. It was comfortable, stayed in place with valsalva and was an appropriate size on examination, with one finger fitting between the pessary and the vaginal walls. We tied a string to it and the patient demonstrated proper removal and replacement.   Assessment/Plan:    Assessment: Kimberly Suarez is a 82 y.o. with stage III pelvic organ prolapse and OAB who presents for a pessary fitting. Plan: She was fitted with a #5 cube pessary. She will keep the pessary in place until next visit. She will use estrogen.   Follow-up in 4 weeks for a pessary check or sooner as needed.    All questions were answered.

## 2022-11-27 ENCOUNTER — Encounter: Payer: Self-pay | Admitting: Obstetrics and Gynecology

## 2022-11-27 ENCOUNTER — Ambulatory Visit: Payer: Medicare Other | Admitting: Obstetrics and Gynecology

## 2022-11-27 VITALS — BP 122/65 | HR 70

## 2022-11-27 DIAGNOSIS — N812 Incomplete uterovaginal prolapse: Secondary | ICD-10-CM

## 2022-11-27 DIAGNOSIS — N8110 Cystocele, unspecified: Secondary | ICD-10-CM

## 2022-11-27 DIAGNOSIS — Z4689 Encounter for fitting and adjustment of other specified devices: Secondary | ICD-10-CM

## 2022-11-27 NOTE — Patient Instructions (Signed)
Continue estorgen cream x2 weekly

## 2022-11-29 ENCOUNTER — Telehealth: Payer: Self-pay | Admitting: Obstetrics and Gynecology

## 2022-11-29 NOTE — Telephone Encounter (Signed)
Patient called this morning saying she feels like her pessary is giving her some issues. It does ok with her lying down, but if she stands or sits, she has very uncomfortable burning sensation.  Wants a call back to discuss please.

## 2022-11-29 NOTE — Telephone Encounter (Signed)
Patient wants to be scheduled for 1:20 tomorrow for me to take out her pessary

## 2022-11-29 NOTE — Progress Notes (Deleted)
[facility]   Subjective:     Chief Complaint: No chief complaint on file.  History of Present Illness: Kimberly Suarez is a 82 y.o. female with stage III pelvic organ prolapse who presents for a pessary check. She is using a size #5 cube pessary. The pessary has been causing patient pain and she wants it out.  Past Medical History: Patient  has a past medical history of ASCUS (atypical squamous cells of undetermined significance) on Pap smear (2011x2), Atypical nevus (05/02/2007), Cancer, Cataract, Diverticulosis, GERD (gastroesophageal reflux disease), Hiatal hernia, Hyperlipemia, Hypertension, IBS (irritable bowel syndrome), Kidney disease, Lymphocytic colitis (2007), Neuropathy, SCC (squamous cell carcinoma) (01/19/2006), SCC (squamous cell carcinoma) (03/08/2017), SCC (squamous cell carcinoma) x 2 (02/24/2002), and Uterine prolapse.   Past Surgical History: She  has a past surgical history that includes Cataract extraction w/ intraocular lens implant (Bilateral) and Breast biopsy.   Medications: She has a current medication list which includes the following prescription(s): alpha-lipoic acid, atorvastatin, enalapril, estradiol, famotidine, fluticasone, gabapentin, glucosamine-chondroitin, hydrochlorothiazide, multiple vitamin, fish oil, saccharomyces boulardii, cyanocobalamin, vitamin b-12, and vitamin d.   Allergies: Patient has No Known Allergies.   Social History: Patient  reports that she has never smoked. She has never used smokeless tobacco. She reports that she does not currently use alcohol. She reports that she does not use drugs.      Objective:    Physical Exam: There were no vitals taken for this visit. Gen: No apparent distress, A&O x 3. Detailed Urogynecologic Evaluation:  Pelvic Exam: Normal external female genitalia; Bartholin's and Skene's glands normal in appearance; urethral meatus {urethra:24773}, no urethral masses or discharge. The pessary  was noted to be {in place:24774}. It was removed and cleaned. Speculum exam revealed {vaginal lesions:24775} in the vagina. The pessary was replaced. It was comfortable to the patient and fit well.       No data to display          Laboratory Results: Urine dipstick shows: {ua dip:315374::"negative for all components"}.    Assessment/Plan:    Assessment: Kimberly Suarez is a 82 y.o. with {PFD symptoms:24771} here for a pessary check. She is doing well.  Plan: She will {pessary plan:24776}. She will continue to use {lubricant:24777}. She will follow-up in *** {days/wks/mos/yrs:310907} for a pessary check or sooner as needed.  All questions were answered.   Time Spent:

## 2022-11-30 ENCOUNTER — Ambulatory Visit: Payer: Medicare Other | Admitting: Obstetrics and Gynecology

## 2022-12-11 DIAGNOSIS — E785 Hyperlipidemia, unspecified: Secondary | ICD-10-CM | POA: Diagnosis not present

## 2022-12-11 DIAGNOSIS — N1831 Chronic kidney disease, stage 3a: Secondary | ICD-10-CM | POA: Diagnosis not present

## 2022-12-11 DIAGNOSIS — I129 Hypertensive chronic kidney disease with stage 1 through stage 4 chronic kidney disease, or unspecified chronic kidney disease: Secondary | ICD-10-CM | POA: Diagnosis not present

## 2022-12-11 DIAGNOSIS — K219 Gastro-esophageal reflux disease without esophagitis: Secondary | ICD-10-CM | POA: Diagnosis not present

## 2022-12-11 DIAGNOSIS — D472 Monoclonal gammopathy: Secondary | ICD-10-CM | POA: Diagnosis not present

## 2022-12-11 DIAGNOSIS — M1990 Unspecified osteoarthritis, unspecified site: Secondary | ICD-10-CM | POA: Diagnosis not present

## 2022-12-11 DIAGNOSIS — R7303 Prediabetes: Secondary | ICD-10-CM | POA: Diagnosis not present

## 2022-12-25 ENCOUNTER — Ambulatory Visit: Payer: Medicare Other | Admitting: Obstetrics and Gynecology

## 2023-01-18 DIAGNOSIS — L814 Other melanin hyperpigmentation: Secondary | ICD-10-CM | POA: Diagnosis not present

## 2023-01-18 DIAGNOSIS — D225 Melanocytic nevi of trunk: Secondary | ICD-10-CM | POA: Diagnosis not present

## 2023-01-18 DIAGNOSIS — L821 Other seborrheic keratosis: Secondary | ICD-10-CM | POA: Diagnosis not present

## 2023-01-18 DIAGNOSIS — Z789 Other specified health status: Secondary | ICD-10-CM | POA: Diagnosis not present

## 2023-01-18 DIAGNOSIS — L820 Inflamed seborrheic keratosis: Secondary | ICD-10-CM | POA: Diagnosis not present

## 2023-01-18 DIAGNOSIS — L298 Other pruritus: Secondary | ICD-10-CM | POA: Diagnosis not present

## 2023-01-18 DIAGNOSIS — L538 Other specified erythematous conditions: Secondary | ICD-10-CM | POA: Diagnosis not present

## 2023-01-31 ENCOUNTER — Encounter: Payer: Self-pay | Admitting: Family Medicine

## 2023-01-31 ENCOUNTER — Ambulatory Visit (INDEPENDENT_AMBULATORY_CARE_PROVIDER_SITE_OTHER): Payer: Medicare Other | Admitting: Family Medicine

## 2023-01-31 VITALS — BP 111/63 | HR 63 | Temp 98.8°F | Ht 61.0 in | Wt 145.0 lb

## 2023-01-31 DIAGNOSIS — M62838 Other muscle spasm: Secondary | ICD-10-CM | POA: Diagnosis not present

## 2023-01-31 DIAGNOSIS — N1832 Chronic kidney disease, stage 3b: Secondary | ICD-10-CM

## 2023-01-31 DIAGNOSIS — R7303 Prediabetes: Secondary | ICD-10-CM

## 2023-01-31 LAB — BAYER DCA HB A1C WAIVED: HB A1C (BAYER DCA - WAIVED): 5.9 % — ABNORMAL HIGH (ref 4.8–5.6)

## 2023-01-31 NOTE — Progress Notes (Signed)
Subjective: CC: Chronic follow-up PCP: Raliegh Ip, DO Kimberly Suarez is a 82 y.o. female presenting to clinic today for:  1.  Prediabetes A1c had risen into the diabetes range last visit but she really wants to work on lifestyle modification to ensure that this in fact was a true diagnosis.  She presents today and tells me that she has really reduced sugar intake.  She tries eat sugar-free items if she has a sweet tooth.  She exercises 2-3 times per week.  2.  CKD 3B/ back pain Patient is compliant with her ACE inhibitor.  No NSAID use despite upper back pain today.  She reports tension in the shoulders.  She is considering chiropractic medicine.  No reports of edema, chest pain.   ROS: Per HPI  No Known Allergies Past Medical History:  Diagnosis Date   ASCUS (atypical squamous cells of undetermined significance) on Pap smear 2011x2   Negative high-risk HPV, normal Pap smears 2012/2013   Atypical nevus 05/02/2007   right back-moderate    Cancer (HCC)    Cataract    Diverticulosis    GERD (gastroesophageal reflux disease)    Hiatal hernia    Hyperlipemia    Hypertension    IBS (irritable bowel syndrome)    Kidney disease    Lymphocytic colitis 2007   Neuropathy    SCC (squamous cell carcinoma) 01/19/2006   bridge of nose (CX35FU)   SCC (squamous cell carcinoma) 03/08/2017   right inner lower shin (CX35FU)   SCC (squamous cell carcinoma) x 2 02/24/2002   right bridge of nose (Cx35FU),right bridge of nose   Uterine prolapse    Pessary    Current Outpatient Medications:    Alpha-Lipoic Acid 600 MG CAPS, Take by mouth., Disp: , Rfl:    atorvastatin (LIPITOR) 40 MG tablet, TAKE 1 TABLET BY MOUTH DAILY, Disp: 100 tablet, Rfl: 2   enalapril (VASOTEC) 10 MG tablet, TAKE 1 TABLET BY MOUTH TWICE  DAILY, Disp: 200 tablet, Rfl: 2   estradiol (ESTRACE) 0.1 MG/GM vaginal cream, Place 0.5g nightly for two weeks then twice a week after, Disp: 30 g, Rfl: 11    famotidine (PEPCID) 20 MG tablet, Take 1 tablet (20 mg total) by mouth daily., Disp: 90 tablet, Rfl: 3   fluticasone (FLONASE) 50 MCG/ACT nasal spray, Place 1 spray into both nostrils 2 (two) times daily as needed for allergies or rhinitis., Disp: 16 g, Rfl: 6   gabapentin (NEURONTIN) 100 MG capsule, TAKE 3 CAPSULES BY MOUTH AT  BEDTIME, Disp: 300 capsule, Rfl: 2   Glucosamine-Chondroitin (GLUCOSAMINE CHONDR COMPLEX PO), Take 1 tablet by mouth 2 (two) times daily., Disp: , Rfl:    hydrochlorothiazide (HYDRODIURIL) 25 MG tablet, TAKE 1 TABLET BY MOUTH DAILY, Disp: 100 tablet, Rfl: 0   Multiple Vitamins-Minerals (MULTIVITAMIN ADULT PO), Take 1 tablet by mouth daily., Disp: , Rfl:    Omega-3 Fatty Acids (FISH OIL) 1000 MG CAPS, Take 1 capsule by mouth daily., Disp: , Rfl:    saccharomyces boulardii (FLORASTOR) 250 MG capsule, Take 250 mg by mouth 2 (two) times daily., Disp: , Rfl:    vitamin B-12 (CYANOCOBALAMIN) 1000 MCG tablet, Take 1,000 mcg by mouth daily., Disp: , Rfl:    vitamin B-12 (CYANOCOBALAMIN) 500 MCG tablet, Take 500 mcg by mouth daily., Disp: , Rfl:    VITAMIN D PO, Take by mouth., Disp: , Rfl:  Social History   Socioeconomic History   Marital status: Married    Spouse name:  Not on file   Number of children: 3    Years of education: Not on file   Highest education level: 8th grade  Occupational History   Occupation: Retired   Tobacco Use   Smoking status: Never   Smokeless tobacco: Never  Vaping Use   Vaping Use: Never used  Substance and Sexual Activity   Alcohol use: Not Currently   Drug use: No   Sexual activity: Not Currently    Birth control/protection: Post-menopausal    Comment: 1st intercourse 15 yo--1 partner  Other Topics Concern   Not on file  Social History Narrative   Lives at home with husband.   Right-handed.   Caffeine use: one cup caffeine some days.   Social Determinants of Health   Financial Resource Strain: Low Risk  (10/09/2018)   Overall  Financial Resource Strain (CARDIA)    Difficulty of Paying Living Expenses: Not hard at all  Food Insecurity: No Food Insecurity (10/09/2018)   Hunger Vital Sign    Worried About Running Out of Food in the Last Year: Never true    Ran Out of Food in the Last Year: Never true  Transportation Needs: No Transportation Needs (10/09/2018)   PRAPARE - Administrator, Civil Service (Medical): No    Lack of Transportation (Non-Medical): No  Physical Activity: Insufficiently Active (10/09/2018)   Exercise Vital Sign    Days of Exercise per Week: 2 days    Minutes of Exercise per Session: 30 min  Stress: No Stress Concern Present (10/09/2018)   [employer] of Occupational Health - Occupational Stress Questionnaire    Feeling of Stress : Not at all  Social Connections: Moderately Integrated (10/09/2018)   Social Connection and Isolation Panel [NHANES]    Frequency of Communication with Friends and Family: More than three times a week    Frequency of Social Gatherings with Friends and Family: More than three times a week    Attends Religious Services: More than 4 times per year    Active Member of [employer] or Organizations: No    Attends Banker Meetings: Never    Marital Status: Married  Catering manager Violence: Not At Risk (10/09/2018)   Humiliation, Afraid, Rape, and Kick questionnaire    Fear of Current or Ex-Partner: No    Emotionally Abused: No    Physically Abused: No    Sexually Abused: No   Family History  Problem Relation Age of Onset   Heart disease Mother    Diabetes Brother        BKA   COPD Father    Asthma Father    Alzheimer's disease Sister    Alzheimer's disease Brother    Multiple sclerosis Sister    Heart disease Sister    Diabetes Brother    Early death Brother        died in war   Colon cancer Neg Hx    Kidney disease Neg Hx    Liver disease Neg Hx     Objective: Office vital signs reviewed. BP 111/63   Pulse 63   Temp 98.8 F  (37.1 C)   Ht 5\' 1"  (1.549 m)   Wt 145 lb (65.8 kg)   SpO2 96%   BMI 27.40 kg/m   Physical Examination:  General: Awake, alert, well nourished, No acute distress HEENT: Sclera white.  Moist mucous membranes Cardio: regular rate and rhythm, S1S2 heard, no murmurs appreciated Pulm: clear to auscultation bilaterally, no wheezes, rhonchi or  rales; normal work of breathing on room air MSK: Increased tenderness of the trapezius muscles.  Ambulating independently.  No midline tenderness palpation to the thoracic spine  Assessment/ Plan: 82 y.o. female   Pre-diabetes - Plan: Bayer DCA Hb A1c Waived  Stage 3b chronic kidney disease (HCC) - Plan: Renal Function Panel  Trapezius muscle spasm  A1c with drop.  She remains in prediabetes range.  Continue to watch diet, exercise regularly  Check renal function panel given CKD.  No evidence of fluid overload  Having trapezius muscle spasm.  Discussed use of Salonpas patches, heat.  Okay to use Tylenol but avoid NSAIDs given CKD.  Okay to proceed with massage therapy if she finds that to be helpful  Orders Placed This Encounter  Procedures   Bayer DCA Hb A1c Waived   Renal Function Panel   No orders of the defined types were placed in this encounter.    Raliegh Ip, DO [facility] [phone]

## 2023-01-31 NOTE — Progress Notes (Signed)
Patient r/ c °

## 2023-02-01 LAB — RENAL FUNCTION PANEL
Albumin: 4.1 g/dL (ref 3.7–4.7)
BUN/Creatinine Ratio: 18 (ref 12–28)
BUN: 22 mg/dL (ref 8–27)
CO2: 26 mmol/L (ref 20–29)
Calcium: 9.7 mg/dL (ref 8.7–10.3)
Chloride: 99 mmol/L (ref 96–106)
Creatinine, Ser: 1.19 mg/dL — ABNORMAL HIGH (ref 0.57–1.00)
Glucose: 108 mg/dL — ABNORMAL HIGH (ref 70–99)
Phosphorus: 4.1 mg/dL (ref 3.0–4.3)
Potassium: 4.4 mmol/L (ref 3.5–5.2)
Sodium: 138 mmol/L (ref 134–144)
eGFR: 46 mL/min/{1.73_m2} — ABNORMAL LOW

## 2023-02-23 DIAGNOSIS — M170 Bilateral primary osteoarthritis of knee: Secondary | ICD-10-CM | POA: Diagnosis not present

## 2023-05-15 ENCOUNTER — Encounter: Payer: Self-pay | Admitting: Family Medicine

## 2023-05-15 ENCOUNTER — Ambulatory Visit (INDEPENDENT_AMBULATORY_CARE_PROVIDER_SITE_OTHER): Payer: Medicare Other | Admitting: Family Medicine

## 2023-05-15 VITALS — BP 133/71 | HR 68 | Temp 97.5°F | Ht 61.0 in | Wt 147.2 lb

## 2023-05-15 DIAGNOSIS — M546 Pain in thoracic spine: Secondary | ICD-10-CM | POA: Diagnosis not present

## 2023-05-15 DIAGNOSIS — G8929 Other chronic pain: Secondary | ICD-10-CM | POA: Diagnosis not present

## 2023-05-15 DIAGNOSIS — Z23 Encounter for immunization: Secondary | ICD-10-CM | POA: Diagnosis not present

## 2023-05-15 MED ORDER — PREDNISONE 10 MG PO TABS: ORAL_TABLET | ORAL | 0 refills | Status: DC

## 2023-05-15 MED ORDER — ACETAMINOPHEN 500 MG PO TABS: 1000.0000 mg | ORAL_TABLET | Freq: Three times a day (TID) | ORAL | 99 refills | Status: AC

## 2023-05-15 NOTE — Progress Notes (Signed)
Chief Complaint  Patient presents with   Shoulder Pain    HPI  Patient presents today for Eight months of intermittent pain in the left shoulder blade with shoulder motion. Not related to exertional activity. Makes it hard to raise the left arm. Aching sensation. Sometimes wakes up at night and has to sit up to breathe for a moment. Does not coincide with this pain. No cough or other dyspnea.   PMH: Smoking status noted ROS: Per HPI  Objective: BP 133/71   Pulse 68   Temp (!) 97.5 F (36.4 C)   Ht 5\' 1"  (1.549 m)   Wt 147 lb 3.2 oz (66.8 kg)   SpO2 95%   BMI 27.81 kg/m  Gen: NAD, alert, cooperative with exam HEENT: NCAT, EOMI, PERRL CV: RRR, good S1/S2, no murmur Resp: CTABL, no wheezes, non-labored Abd: SNTND, BS present, no guarding or organomegaly Ext: No edema, warm Neuro: Alert and oriented, No gross deficits  Assessment and plan:  1. Chronic left-sided thoracic back pain   2. Encounter for immunization     Meds ordered this encounter  Medications   acetaminophen (TYLENOL) 500 MG tablet    Sig: Take 2 tablets (1,000 mg total) by mouth 3 (three) times daily.    Dispense:  180 tablet    Refill:  PRN   predniSONE (DELTASONE) 10 MG tablet    Sig: Take 5 daily for 2 days followed by 4,3,2 and 1 for 2 days each.    Dispense:  30 tablet    Refill:  0    Orders Placed This Encounter  Procedures   Flu Vaccine Trivalent High Dose (Fluad)    Follow up as needed.  Mechele Claude, MD

## 2023-06-06 DIAGNOSIS — M170 Bilateral primary osteoarthritis of knee: Secondary | ICD-10-CM | POA: Diagnosis not present

## 2023-06-22 ENCOUNTER — Other Ambulatory Visit: Payer: Medicare Other

## 2023-06-22 DIAGNOSIS — N1831 Chronic kidney disease, stage 3a: Secondary | ICD-10-CM | POA: Diagnosis not present

## 2023-06-22 DIAGNOSIS — R7303 Prediabetes: Secondary | ICD-10-CM | POA: Diagnosis not present

## 2023-06-22 LAB — BAYER DCA HB A1C WAIVED: HB A1C (BAYER DCA - WAIVED): 5.9 % — ABNORMAL HIGH (ref 4.8–5.6)

## 2023-06-23 LAB — RENAL FUNCTION PANEL
Albumin: 4.1 g/dL (ref 3.7–4.7)
BUN/Creatinine Ratio: 15 (ref 12–28)
BUN: 21 mg/dL (ref 8–27)
CO2: 25 mmol/L (ref 20–29)
Calcium: 9.7 mg/dL (ref 8.7–10.3)
Chloride: 98 mmol/L (ref 96–106)
Creatinine, Ser: 1.38 mg/dL — ABNORMAL HIGH (ref 0.57–1.00)
Glucose: 101 mg/dL — ABNORMAL HIGH (ref 70–99)
Phosphorus: 3.2 mg/dL (ref 3.0–4.3)
Potassium: 4.2 mmol/L (ref 3.5–5.2)
Sodium: 136 mmol/L (ref 134–144)
eGFR: 38 mL/min/{1.73_m2} — ABNORMAL LOW

## 2023-06-23 LAB — MICROALBUMIN / CREATININE URINE RATIO
Creatinine, Urine: 85.9 mg/dL
Microalb/Creat Ratio: 19 mg/g{creat} (ref 0–29)
Microalbumin, Urine: 16.7 ug/mL

## 2023-06-26 ENCOUNTER — Encounter: Payer: Self-pay | Admitting: Family Medicine

## 2023-06-26 ENCOUNTER — Ambulatory Visit (INDEPENDENT_AMBULATORY_CARE_PROVIDER_SITE_OTHER): Payer: Medicare Other | Admitting: Family Medicine

## 2023-06-26 VITALS — BP 117/61 | HR 91 | Temp 97.9°F | Ht 61.0 in | Wt 147.0 lb

## 2023-06-26 DIAGNOSIS — I4891 Unspecified atrial fibrillation: Secondary | ICD-10-CM | POA: Diagnosis not present

## 2023-06-26 DIAGNOSIS — R7303 Prediabetes: Secondary | ICD-10-CM | POA: Diagnosis not present

## 2023-06-26 DIAGNOSIS — K222 Esophageal obstruction: Secondary | ICD-10-CM | POA: Diagnosis not present

## 2023-06-26 DIAGNOSIS — R002 Palpitations: Secondary | ICD-10-CM

## 2023-06-26 DIAGNOSIS — K219 Gastro-esophageal reflux disease without esophagitis: Secondary | ICD-10-CM | POA: Diagnosis not present

## 2023-06-26 DIAGNOSIS — N1832 Chronic kidney disease, stage 3b: Secondary | ICD-10-CM

## 2023-06-26 MED ORDER — FAMOTIDINE 20 MG PO TABS: 20.0000 mg | ORAL_TABLET | ORAL | 3 refills | Status: DC

## 2023-06-26 MED ORDER — APIXABAN 5 MG PO TABS: 5.0000 mg | ORAL_TABLET | Freq: Two times a day (BID) | ORAL | 0 refills | Status: DC

## 2023-06-26 MED ORDER — METOPROLOL TARTRATE 25 MG PO TABS: 12.5000 mg | ORAL_TABLET | Freq: Two times a day (BID) | ORAL | 0 refills | Status: DC

## 2023-06-26 NOTE — Progress Notes (Signed)
Subjective: CC: Interval checkup PCP: Raliegh Ip, DO [id number] Kimberly Suarez is a 82 y.o. female presenting to clinic today for:  1.  CKD 3, prediabetes, heart palpitations She had labs drawn.  Continues to see Dr. Arrie Aran for her kidney.  Next office visit in a couple of months.  She notes that she is been having some intermittent heart palpitations where her chest just feels like it is fluttering.  She denies any associated chest pain, shortness of breath, swelling.  She has not had any bleeding anywhere.  No changes in diet.  She would like to see Dr. Antoine Poche, her husband sees him.   ROS: Per HPI  No Known Allergies Past Medical History:  Diagnosis Date   ASCUS (atypical squamous cells of undetermined significance) on Pap smear 2011x2   Negative high-risk HPV, normal Pap smears 2012/2013   Atypical nevus 05/02/2007   right back-moderate    Cancer (HCC)    Cataract    Diverticulosis    GERD (gastroesophageal reflux disease)    Hiatal hernia    Hyperlipemia    Hypertension    IBS (irritable bowel syndrome)    Kidney disease    Lymphocytic colitis 2007   Neuropathy    SCC (squamous cell carcinoma) 01/19/2006   bridge of nose (CX35FU)   SCC (squamous cell carcinoma) 03/08/2017   right inner lower shin (CX35FU)   SCC (squamous cell carcinoma) x 2 02/24/2002   right bridge of nose (Cx35FU),right bridge of nose   Uterine prolapse    Pessary    Current Outpatient Medications:    acetaminophen (TYLENOL) 500 MG tablet, Take 2 tablets (1,000 mg total) by mouth 3 (three) times daily., Disp: 180 tablet, Rfl: PRN   Alpha-Lipoic Acid 600 MG CAPS, Take by mouth., Disp: , Rfl:    atorvastatin (LIPITOR) 40 MG tablet, TAKE 1 TABLET BY MOUTH DAILY, Disp: 100 tablet, Rfl: 2   enalapril (VASOTEC) 10 MG tablet, TAKE 1 TABLET BY MOUTH TWICE  DAILY, Disp: 200 tablet, Rfl: 2   estradiol (ESTRACE) 0.1 MG/GM vaginal cream, Place 0.5g nightly for two weeks then twice a week after,  Disp: 30 g, Rfl: 11   famotidine (PEPCID) 20 MG tablet, Take 1 tablet (20 mg total) by mouth daily., Disp: 90 tablet, Rfl: 3   fluticasone (FLONASE) 50 MCG/ACT nasal spray, Place 1 spray into both nostrils 2 (two) times daily as needed for allergies or rhinitis., Disp: 16 g, Rfl: 6   gabapentin (NEURONTIN) 100 MG capsule, TAKE 3 CAPSULES BY MOUTH AT  BEDTIME, Disp: 300 capsule, Rfl: 2   Glucosamine-Chondroitin (GLUCOSAMINE CHONDR COMPLEX PO), Take 1 tablet by mouth 2 (two) times daily., Disp: , Rfl:    hydrochlorothiazide (HYDRODIURIL) 25 MG tablet, TAKE 1 TABLET BY MOUTH DAILY, Disp: 100 tablet, Rfl: 0   Multiple Vitamins-Minerals (MULTIVITAMIN ADULT PO), Take 1 tablet by mouth daily., Disp: , Rfl:    Omega-3 Fatty Acids (FISH OIL) 1000 MG CAPS, Take 1 capsule by mouth daily., Disp: , Rfl:    saccharomyces boulardii (FLORASTOR) 250 MG capsule, Take 250 mg by mouth 2 (two) times daily., Disp: , Rfl:    vitamin B-12 (CYANOCOBALAMIN) 1000 MCG tablet, Take 1,000 mcg by mouth daily., Disp: , Rfl:    vitamin B-12 (CYANOCOBALAMIN) 500 MCG tablet, Take 500 mcg by mouth daily., Disp: , Rfl:    VITAMIN D PO, Take by mouth., Disp: , Rfl:  Social History   Socioeconomic History   Marital status: Married  Spouse name: Not on file   Number of children: 3    Years of education: Not on file   Highest education level: 8th grade  Occupational History   Occupation: Retired   Tobacco Use   Smoking status: Never   Smokeless tobacco: Never  Vaping Use   Vaping status: Never Used  Substance and Sexual Activity   Alcohol use: Not Currently   Drug use: No   Sexual activity: Not Currently    Birth control/protection: Post-menopausal    Comment: 1st intercourse 15 yo--1 partner  Other Topics Concern   Not on file  Social History Narrative   Lives at home with husband.   Right-handed.   Caffeine use: one cup caffeine some days.   Social Determinants of Health   Financial Resource Strain: Low Risk   (10/09/2018)   Overall Financial Resource Strain (CARDIA)    Difficulty of Paying Living Expenses: Not hard at all  Food Insecurity: No Food Insecurity (10/09/2018)   Hunger Vital Sign    Worried About Running Out of Food in the Last Year: Never true    Ran Out of Food in the Last Year: Never true  Transportation Needs: No Transportation Needs (10/09/2018)   PRAPARE - Administrator, Civil Service (Medical): No    Lack of Transportation (Non-Medical): No  Physical Activity: Insufficiently Active (10/09/2018)   Exercise Vital Sign    Days of Exercise per Week: 2 days    Minutes of Exercise per Session: 30 min  Stress: No Stress Concern Present (10/09/2018)   [employer] of Occupational Health - Occupational Stress Questionnaire    Feeling of Stress : Not at all  Social Connections: Moderately Integrated (10/09/2018)   Social Connection and Isolation Panel [NHANES]    Frequency of Communication with Friends and Family: More than three times a week    Frequency of Social Gatherings with Friends and Family: More than three times a week    Attends Religious Services: More than 4 times per year    Active Member of [employer] or Organizations: No    Attends Banker Meetings: Never    Marital Status: Married  Catering manager Violence: Not At Risk (10/09/2018)   Humiliation, Afraid, Rape, and Kick questionnaire    Fear of Current or Ex-Partner: No    Emotionally Abused: No    Physically Abused: No    Sexually Abused: No   Family History  Problem Relation Age of Onset   Heart disease Mother    Diabetes Brother        BKA   COPD Father    Asthma Father    Alzheimer's disease Sister    Alzheimer's disease Brother    Multiple sclerosis Sister    Heart disease Sister    Diabetes Brother    Early death Brother        died in war   Colon cancer Neg Hx    Kidney disease Neg Hx    Liver disease Neg Hx     Objective: Office vital signs reviewed. BP 117/61    Pulse 91   Temp 97.9 F (36.6 C)   Ht 5\' 1"  (1.549 m)   Wt 147 lb (66.7 kg)   SpO2 96%   BMI 27.78 kg/m   Physical Examination:  General: Awake, alert, well nourished, No acute distress HEENT: Sclera white.  No conjunctival pallor.  No exophthalmos or goiter. Cardio: Irregularly irregular. S1S2 heard, no murmurs appreciated Pulm: clear to auscultation  bilaterally, no wheezes, rhonchi or rales; normal work of breathing on room air    Recent Results (from the past 2160 hour(s))  Bayer DCA Hb A1c Waived     Status: Abnormal   Collection Time: 06/22/23  9:29 AM  Result Value Ref Range   HB A1C (BAYER DCA - WAIVED) 5.9 (H) 4.8 - 5.6 %    Comment:          Prediabetes: 5.7 - 6.4          Diabetes: >6.4          Glycemic control for adults with diabetes: <7.0   Renal Function Panel     Status: Abnormal   Collection Time: 06/22/23  9:35 AM  Result Value Ref Range   Glucose 101 (H) 70 - 99 mg/dL   BUN 21 8 - 27 mg/dL   Creatinine, Ser 1.61 (H) 0.57 - 1.00 mg/dL   eGFR 38 (L) >09 UE/AVW/0.98   BUN/Creatinine Ratio 15 12 - 28   Sodium 136 134 - 144 mmol/L   Potassium 4.2 3.5 - 5.2 mmol/L   Chloride 98 96 - 106 mmol/L   CO2 25 20 - 29 mmol/L   Calcium 9.7 8.7 - 10.3 mg/dL   Phosphorus 3.2 3.0 - 4.3 mg/dL   Albumin 4.1 3.7 - 4.7 g/dL  Microalbumin / creatinine urine ratio     Status: None   Collection Time: 06/22/23  9:35 AM  Result Value Ref Range   Creatinine, Urine 85.9 Not Estab. mg/dL   Microalbumin, Urine 11.9 Not Estab. ug/mL   Microalb/Creat Ratio 19 0 - 29 mg/g creat    Comment:                        Normal:                0 -  29                        Moderately increased: 30 - 300                        Severely increased:       >300      Assessment/ Plan: 82 y.o. female   New onset a-fib (HCC) - Plan: metoprolol tartrate (LOPRESSOR) 25 MG tablet, apixaban (ELIQUIS) 5 MG TABS tablet  Heart palpitations - Plan: EKG 12-Lead, Ambulatory referral to  Cardiology  Stage 3b chronic kidney disease (HCC) - Plan: EKG 12-Lead  Pre-diabetes  GERD with stricture - Plan: famotidine (PEPCID) 20 MG tablet  Her heart exam was irregularly irregular on exam so EKG was obtained and this did confirm atrial fibrillation.  This is a new diagnosis for patient.  Her CHADSVASC 4. STAT referral to cardiology as she declined eval in hospital.  Since she denied red flag signs or symptoms, will try and arrange outpatient stat referral with cardiology for her.  I discussed with her red flag signs and symptoms warranting emergent evaluation in the ER and she voiced good understanding.  In the meantime I have placed her on a low-dose beta-blocker as well as Eliquis 5 mg twice daily.  Will need to watch her blood pressure carefully with the beta-blocker.  I have reached out to Dr. Antoine Poche in efforts to coordinate care and will gladly assist as he directs.  I reviewed with her her labs.  Continue to follow-up with nephrology as  directed.  Continue dietary restriction given history of prediabetes  Last Rx for famotidine was 2022.  Given progression of renal disease I have adjusted her dose for every other day dosing given GFR of less than 50.  Raliegh Ip, DO [facility] [phone]

## 2023-07-02 ENCOUNTER — Telehealth: Payer: Self-pay | Admitting: Family Medicine

## 2023-07-02 NOTE — Telephone Encounter (Signed)
Appt scheduled with G 11/26 at 12:05. Pt made aware.  She will monitor BP and heart rate until then. Advised pt if she feels, light headed, dizzy, feeling of passing out then she needs to go to hospital or see [place] sooner. Informed if BO too low or heart rate too low to call the office for a sooner appt.

## 2023-07-04 ENCOUNTER — Ambulatory Visit: Payer: Self-pay | Admitting: Family Medicine

## 2023-07-04 NOTE — Telephone Encounter (Signed)
Copied from CRM [id number]. Topic: Clinical - Red Word Triage >> Jul 04, 2023  4:14 PM Raven B wrote: Red Word that prompted transfer to Nurse Triage: PT advised pulse has decreased since taking prescribed medication metoprolol tartrate.   Chief Complaint: Medication Question Symptoms: Pulse in the 50s, some lightheadedness and dizziness. Frequency: Often, since medication started on 11/4 Pertinent Negatives: Patient denies Shortness of Breath, Chest Pain, or any other acute-life threatening systems.  Disposition: [] ED /[] Urgent Care (no appt availability in office) / [x] Appointment(In office/virtual)/ []  [facility]/ [] Home Care/ [] Refused Recommended Disposition /[] [facility]/ []  Follow-up with PCP Additional Notes: Appointment scheduled for tomorrow, Primary Provider is out of office.     Reason for Disposition  [1] Caller has URGENT medicine question about med that PCP or specialist prescribed AND [2] triager unable to answer question  Answer Assessment - Initial Assessment Questions 1. NAME of MEDICINE: "What medicine(s) are you calling about?"     Metoprolol Tartrate  2. QUESTION: "What is your question?" (e.g., double dose of medicine, side effect)     Is this normal? 3. PRESCRIBER: "Who prescribed the medicine?" Reason: if prescribed by specialist, call should be referred to that group.     Delynn Flavin, DO 4. SYMPTOMS: "Do you have any symptoms?" If Yes, ask: "What symptoms are you having?"  "How bad are the symptoms (e.g., mild, moderate, severe)     Pulse is in the 50's. Dizziness, Lightheadedness, since medication start. Prescribed it on 10/29, didn't obtain until 11/1, started taking 11/4.  5. PREGNANCY:  "Is there any chance that you are pregnant?" "When was your last menstrual period?"     No.  Protocols used: Medication Question Call-A-AH

## 2023-07-05 ENCOUNTER — Encounter: Payer: Self-pay | Admitting: Family Medicine

## 2023-07-05 ENCOUNTER — Ambulatory Visit (INDEPENDENT_AMBULATORY_CARE_PROVIDER_SITE_OTHER): Payer: Medicare Other | Admitting: Family Medicine

## 2023-07-05 ENCOUNTER — Ambulatory Visit: Payer: Medicare Other

## 2023-07-05 VITALS — BP 125/68 | HR 60 | Temp 98.2°F | Ht 61.0 in | Wt 147.0 lb

## 2023-07-05 DIAGNOSIS — I4891 Unspecified atrial fibrillation: Secondary | ICD-10-CM

## 2023-07-05 DIAGNOSIS — N1832 Chronic kidney disease, stage 3b: Secondary | ICD-10-CM

## 2023-07-05 NOTE — Progress Notes (Signed)
Subjective:  Patient ID: Kimberly Suarez, female    DOB: [birth year], 82 y.o.   MRN: [id number]  Patient Care Team: Raliegh Ip, DO as PCP - General (Family Medicine) Pyrtle, Carie Caddy, MD as Consulting Physician (Gastroenterology) Glyn Ade, PA-C as Physician Assistant (Dermatology) Terrial Rhodes, MD as Consulting Physician (Nephrology) Ardell Isaacs, Forrestine Him, MD as Consulting Physician (Obstetrics and Gynecology)   Chief Complaint:  No chief complaint on file.  HPI: Kimberly Suarez is a 82 y.o. female presenting on 07/05/2023 for Medication Problem Patient states that her HR is staying at 50-52 since starting metoprolol. Endorses slight dizziness. States that it is mostly when she stands up. Denies symptoms of presyncope and fatigue. States that her BP has been normal at home. In addition, she is concerned that the eliquis may harm her kidneys and would like to know if she can stop medication.  She is scheduled with Cardiology 12/17 and reports that she is following up with nephrology later this month.   Relevant past medical, surgical, family, and social history reviewed and updated as indicated.  Allergies and medications reviewed and updated. Data reviewed: Chart in Epic.   Past Medical History:  Diagnosis Date   ASCUS (atypical squamous cells of undetermined significance) on Pap smear 2011x2   Negative high-risk HPV, normal Pap smears 2012/2013   Atypical nevus 05/02/2007   right back-moderate    Cancer (HCC)    Cataract    Diverticulosis    GERD (gastroesophageal reflux disease)    Hiatal hernia    Hyperlipemia    Hypertension    IBS (irritable bowel syndrome)    Kidney disease    Lymphocytic colitis 2007   Neuropathy    SCC (squamous cell carcinoma) 01/19/2006   bridge of nose (CX35FU)   SCC (squamous cell carcinoma) 03/08/2017   right inner lower shin (CX35FU)   SCC (squamous cell carcinoma) x 2 02/24/2002   right bridge of nose  (Cx35FU),right bridge of nose   Uterine prolapse    Pessary    Past Surgical History:  Procedure Laterality Date   BREAST BIOPSY     unsure which breast   CATARACT EXTRACTION W/ INTRAOCULAR LENS IMPLANT Bilateral     Social History   Socioeconomic History   Marital status: Married    Spouse name: Not on file   Number of children: 3    Years of education: Not on file   Highest education level: 8th grade  Occupational History   Occupation: Retired   Tobacco Use   Smoking status: Never   Smokeless tobacco: Never  Vaping Use   Vaping status: Never Used  Substance and Sexual Activity   Alcohol use: Not Currently   Drug use: No   Sexual activity: Not Currently    Birth control/protection: Post-menopausal    Comment: 1st intercourse 15 yo--1 partner  Other Topics Concern   Not on file  Social History Narrative   Lives at home with husband.   Right-handed.   Caffeine use: one cup caffeine some days.   Social Determinants of Health   Financial Resource Strain: Low Risk  (10/09/2018)   Overall Financial Resource Strain (CARDIA)    Difficulty of Paying Living Expenses: Not hard at all  Food Insecurity: No Food Insecurity (10/09/2018)   Hunger Vital Sign    Worried About Running Out of Food in the Last Year: Never true    Ran Out of Food in the Last Year: Never  true  Transportation Needs: No Transportation Needs (10/09/2018)   PRAPARE - Administrator, Civil Service (Medical): No    Lack of Transportation (Non-Medical): No  Physical Activity: Insufficiently Active (10/09/2018)   Exercise Vital Sign    Days of Exercise per Week: 2 days    Minutes of Exercise per Session: 30 min  Stress: No Stress Concern Present (10/09/2018)   [employer] of Occupational Health - Occupational Stress Questionnaire    Feeling of Stress : Not at all  Social Connections: Moderately Integrated (10/09/2018)   Social Connection and Isolation Panel [NHANES]    Frequency of  Communication with Friends and Family: More than three times a week    Frequency of Social Gatherings with Friends and Family: More than three times a week    Attends Religious Services: More than 4 times per year    Active Member of [employer] or Organizations: No    Attends Banker Meetings: Never    Marital Status: Married  Catering manager Violence: Not At Risk (10/09/2018)   Humiliation, Afraid, Rape, and Kick questionnaire    Fear of Current or Ex-Partner: No    Emotionally Abused: No    Physically Abused: No    Sexually Abused: No    Outpatient Encounter Medications as of 07/05/2023  Medication Sig   acetaminophen (TYLENOL) 500 MG tablet Take 2 tablets (1,000 mg total) by mouth 3 (three) times daily.   Alpha-Lipoic Acid 600 MG CAPS Take by mouth.   apixaban (ELIQUIS) 5 MG TABS tablet Take 1 tablet (5 mg total) by mouth 2 (two) times daily. For afib   atorvastatin (LIPITOR) 40 MG tablet TAKE 1 TABLET BY MOUTH DAILY   enalapril (VASOTEC) 10 MG tablet TAKE 1 TABLET BY MOUTH TWICE  DAILY   estradiol (ESTRACE) 0.1 MG/GM vaginal cream Place 0.5g nightly for two weeks then twice a week after   famotidine (PEPCID) 20 MG tablet Take 1 tablet (20 mg total) by mouth every other day. ** PLEASE NOTE THIS IS ADJUSTED FOR KIDNEY FUNCTION   fluticasone (FLONASE) 50 MCG/ACT nasal spray Place 1 spray into both nostrils 2 (two) times daily as needed for allergies or rhinitis.   gabapentin (NEURONTIN) 100 MG capsule TAKE 3 CAPSULES BY MOUTH AT  BEDTIME   Glucosamine-Chondroitin (GLUCOSAMINE CHONDR COMPLEX PO) Take 1 tablet by mouth 2 (two) times daily.   hydrochlorothiazide (HYDRODIURIL) 25 MG tablet TAKE 1 TABLET BY MOUTH DAILY   metoprolol tartrate (LOPRESSOR) 25 MG tablet Take 0.5 tablets (12.5 mg total) by mouth 2 (two) times daily.   Multiple Vitamins-Minerals (MULTIVITAMIN ADULT PO) Take 1 tablet by mouth daily.   Omega-3 Fatty Acids (FISH OIL) 1000 MG CAPS Take 1 capsule by mouth daily.    saccharomyces boulardii (FLORASTOR) 250 MG capsule Take 250 mg by mouth 2 (two) times daily.   vitamin B-12 (CYANOCOBALAMIN) 1000 MCG tablet Take 1,000 mcg by mouth daily.   vitamin B-12 (CYANOCOBALAMIN) 500 MCG tablet Take 500 mcg by mouth daily.   VITAMIN D PO Take by mouth.   No facility-administered encounter medications on file as of 07/05/2023.    No Known Allergies  Review of Systems As per HPI Objective:  BP 125/68   Pulse 60   Temp 98.2 F (36.8 C)   Ht 5\' 1"  (1.549 m)   Wt 147 lb (66.7 kg)   SpO2 98%   BMI 27.78 kg/m    Wt Readings from Last 3 Encounters:  07/05/23 147 lb (66.7  kg)  06/26/23 147 lb (66.7 kg)  05/15/23 147 lb 3.2 oz (66.8 kg)    Physical Exam Constitutional:      General: She is awake. She is not in acute distress.    Appearance: Normal appearance. She is well-developed and well-groomed. She is not ill-appearing, toxic-appearing or diaphoretic.  Cardiovascular:     Rate and Rhythm: Regular rhythm. Bradycardia present.     Pulses: Normal pulses.          Radial pulses are 2+ on the right side and 2+ on the left side.       Posterior tibial pulses are 2+ on the right side and 2+ on the left side.     Heart sounds: Normal heart sounds. No murmur heard.    No gallop.  Pulmonary:     Effort: Pulmonary effort is normal. No respiratory distress.     Breath sounds: Normal breath sounds. No stridor. No wheezing, rhonchi or rales.  Musculoskeletal:     Cervical back: Full passive range of motion without pain and neck supple.     Right lower leg: No edema.     Left lower leg: No edema.  Skin:    General: Skin is warm.     Capillary Refill: Capillary refill takes less than 2 seconds.  Neurological:     General: No focal deficit present.     Mental Status: She is alert, oriented to person, place, and time and easily aroused. Mental status is at baseline.     GCS: GCS eye subscore is 4. GCS verbal subscore is 5. GCS motor subscore is 6.     Motor:  No weakness.  Psychiatric:        Attention and Perception: Attention and perception normal.        Mood and Affect: Mood and affect normal.        Speech: Speech normal.        Behavior: Behavior normal. Behavior is cooperative.        Thought Content: Thought content normal. Thought content does not include homicidal or suicidal ideation. Thought content does not include homicidal or suicidal plan.        Cognition and Memory: Cognition and memory normal.        Judgment: Judgment normal.     Results for orders placed or performed in visit on 06/22/23  Bayer DCA Hb A1c Waived  Result Value Ref Range   HB A1C (BAYER DCA - WAIVED) 5.9 (H) 4.8 - 5.6 %  Renal Function Panel  Result Value Ref Range   Glucose 101 (H) 70 - 99 mg/dL   BUN 21 8 - 27 mg/dL   Creatinine, Ser 1.61 (H) 0.57 - 1.00 mg/dL   eGFR 38 (L) >09 UE/AVW/0.98   BUN/Creatinine Ratio 15 12 - 28   Sodium 136 134 - 144 mmol/L   Potassium 4.2 3.5 - 5.2 mmol/L   Chloride 98 96 - 106 mmol/L   CO2 25 20 - 29 mmol/L   Calcium 9.7 8.7 - 10.3 mg/dL   Phosphorus 3.2 3.0 - 4.3 mg/dL   Albumin 4.1 3.7 - 4.7 g/dL  Microalbumin / creatinine urine ratio  Result Value Ref Range   Creatinine, Urine 85.9 Not Estab. mg/dL   Microalbumin, Urine 11.9 Not Estab. ug/mL   Microalb/Creat Ratio 19 0 - 29 mg/g creat       07/05/2023    3:59 PM 06/26/2023    1:43 PM 05/15/2023    1:58 PM 01/31/2023  9:17 AM 10/18/2022    9:48 AM  Depression screen PHQ 2/9  Decreased Interest 0 0 0 0 0  Down, Depressed, Hopeless 0 0 0 0 0  PHQ - 2 Score 0 0 0 0 0  Altered sleeping 0 0  0 0  Tired, decreased energy 0 0  0 0  Change in appetite 0 0  0 0  Feeling bad or failure about yourself  0 0  0 0  Trouble concentrating 0 0  0 0  Moving slowly or fidgety/restless 0 0  0 0  Suicidal thoughts 0 0  0 0  PHQ-9 Score 0 0  0 0  Difficult doing work/chores Not difficult at all   Not difficult at all Not difficult at all       07/05/2023    3:59 PM  06/26/2023    1:44 PM 05/15/2023    1:59 PM 01/31/2023    9:18 AM  GAD 7 : Generalized Anxiety Score  Nervous, Anxious, on Edge 1 0 0 0  Control/stop worrying 1 0 2 0  Worry too much - different things 1 1 2  0  Trouble relaxing 2 1 0 0  Restless 0 0 0 0  Easily annoyed or irritable 0 0 0 0  Afraid - awful might happen 0 1 2 0  Total GAD 7 Score 5 3 6  0  Anxiety Difficulty  Somewhat difficult Somewhat difficult Not difficult at all   Pertinent labs & imaging results that were available during my care of the patient were reviewed by me and considered in my medical decision making.  Assessment & Plan:  Kimberly Suarez was seen today for medication problem.  Diagnoses and all orders for this visit:  New onset a-fib [facility]) Reassured patient that given her age, weight, and current renal function, she is on the appropriate dose of eliquis. Encouraged patient to continue to take eliquis as prescribed. Will repeat labs as below to monitor renal function. Patient to follow up with nephrology and PCP later this month. Provided patient with hold parameters for metoprolol. Instructed patient to hold metoprolol for HR less than 60 bpm. BP stable. EKG as below Confirmed, converted to NSR. Now sinus bradycardia. -     CMP14+EGFR -     EKG 12-Lead  Stage 3b chronic kidney disease (HCC) Reassured patient that given her age, weight, and current renal function, she is on the appropriate dose of eliquis. Encouraged patient to continue to take eliquis as prescribed. Will repeat labs as below to monitor renal function. Patient to follow up with nephrology and PCP later this month -     CMP14+EGFR -     EKG 12-Lead  Continue all other maintenance medications.  Follow up plan: Return for has appt scheduled .  Continue healthy lifestyle choices, including diet (rich in fruits, vegetables, and lean proteins, and low in salt and simple carbohydrates) and exercise (at least 30 minutes of moderate physical activity  daily).  Written and verbal instructions provided   The above assessment and management plan was discussed with the patient. The patient verbalized understanding of and has agreed to the management plan. Patient is aware to call the clinic if they develop any new symptoms or if symptoms persist or worsen. Patient is aware when to return to the clinic for a follow-up visit. Patient educated on when it is appropriate to go to the emergency department.   Neale Burly, DNP-FNP [facility] [address] [place], [place]  [address] [phone] 

## 2023-07-05 NOTE — Patient Instructions (Addendum)
Check HR in morning, if HR is less than 60 beats per minute, do not take metoprolol. If HR is greater than 60, then take metoprolol.  Repeat this in the evenings.  Write down your measurements daily and bring log to follow up with [facility] and with Cardiology, [place].  Continue to take eliquis.

## 2023-07-06 LAB — CMP14+EGFR
ALT: 26 [IU]/L (ref 0–32)
AST: 27 [IU]/L (ref 0–40)
Albumin: 4.2 g/dL (ref 3.7–4.7)
Alkaline Phosphatase: 51 [IU]/L (ref 44–121)
BUN/Creatinine Ratio: 19 (ref 12–28)
BUN: 23 mg/dL (ref 8–27)
Bilirubin Total: 0.3 mg/dL (ref 0.0–1.2)
CO2: 27 mmol/L (ref 20–29)
Calcium: 9.3 mg/dL (ref 8.7–10.3)
Chloride: 95 mmol/L — ABNORMAL LOW (ref 96–106)
Creatinine, Ser: 1.24 mg/dL — ABNORMAL HIGH (ref 0.57–1.00)
Globulin, Total: 2.4 g/dL (ref 1.5–4.5)
Glucose: 109 mg/dL — ABNORMAL HIGH (ref 70–99)
Potassium: 4.4 mmol/L (ref 3.5–5.2)
Sodium: 134 mmol/L (ref 134–144)
Total Protein: 6.6 g/dL (ref 6.0–8.5)
eGFR: 43 mL/min/{1.73_m2} — ABNORMAL LOW

## 2023-07-10 NOTE — Progress Notes (Signed)
Renal function remains in range where eliquis is at correct dose. Recommend patient follow up with PCP.

## 2023-07-12 ENCOUNTER — Ambulatory Visit: Payer: Medicare Other | Attending: Cardiology | Admitting: Cardiology

## 2023-07-12 ENCOUNTER — Ambulatory Visit (INDEPENDENT_AMBULATORY_CARE_PROVIDER_SITE_OTHER): Payer: Medicare Other

## 2023-07-12 ENCOUNTER — Encounter: Payer: Self-pay | Admitting: Cardiology

## 2023-07-12 VITALS — BP 128/70 | HR 71 | Ht 64.0 in | Wt 148.4 lb

## 2023-07-12 DIAGNOSIS — I10 Essential (primary) hypertension: Secondary | ICD-10-CM

## 2023-07-12 DIAGNOSIS — I480 Paroxysmal atrial fibrillation: Secondary | ICD-10-CM

## 2023-07-12 DIAGNOSIS — Z7901 Long term (current) use of anticoagulants: Secondary | ICD-10-CM | POA: Diagnosis not present

## 2023-07-12 DIAGNOSIS — Z79899 Other long term (current) drug therapy: Secondary | ICD-10-CM | POA: Diagnosis not present

## 2023-07-12 DIAGNOSIS — N183 Chronic kidney disease, stage 3 unspecified: Secondary | ICD-10-CM

## 2023-07-12 NOTE — Patient Instructions (Signed)
Medication Instructions:  Your physician recommends that you continue on your current medications as directed. Please refer to the Current Medication list given to you today.  *If you need a refill on your cardiac medications before your next appointment, please call your pharmacy*   Lab Work: CBC If you have labs (blood work) drawn today and your tests are completely normal, you will receive your results only by: MyChart Message (if you have MyChart) OR A paper copy in the mail If you have any lab test that is abnormal or we need to change your treatment, we will call you to review the results.   Testing/Procedures: Your physician has requested that you have an echocardiogram. Echocardiography is a painless test that uses sound waves to create images of your heart. It provides your doctor with information about the size and shape of your heart and how well your heart's chambers and valves are working. This procedure takes approximately one hour. There are no restrictions for this procedure. Please do NOT wear cologne, perfume, aftershave, or lotions (deodorant is allowed). Please arrive 15 minutes prior to your appointment time.  Please note: We ask at that you not bring children with you during ultrasound (echo/ vascular) testing. Due to room size and safety concerns, children are not allowed in the ultrasound rooms during exams. Our front office staff cannot provide observation of children in our lobby area while testing is being conducted. An adult accompanying a patient to their appointment will only be allowed in the ultrasound room at the discretion of the ultrasound technician under special circumstances. We apologize for any inconvenience.    Follow-Up: At [facility], you and your health needs are our priority.  As part of our continuing mission to provide you with exceptional heart care, we have created designated Provider Care Teams.  These Care Teams include your primary  Cardiologist (physician) and Advanced Practice Providers (APPs -  Physician Assistants and Nurse Practitioners) who all work together to provide you with the care you need, when you need it. ZIO XT- Long Term Monitor Instructions  Your physician has requested you wear a ZIO patch monitor for 14 days.  This is a single patch monitor. [employer] supplies one patch monitor per enrollment. Additional stickers are not available. Please do not apply patch if you will be having a Nuclear Stress Test,  Echocardiogram, Cardiac CT, MRI, or Chest Xray during the period you would be wearing the  monitor. The patch cannot be worn during these tests. You cannot remove and re-apply the  ZIO XT patch monitor.  Your ZIO patch monitor will be mailed 3 day USPS to your address on file. It may take 3-5 days  to receive your monitor after you have been enrolled.  Once you have received your monitor, please review the enclosed instructions. Your monitor  has already been registered assigning a specific monitor serial # to you.  Billing and Patient Assistance Program Information  We have supplied [employer] with any of your insurance information on file for billing purposes. [employer] offers a sliding scale Patient Assistance Program for patients that do not have  insurance, or whose insurance does not completely cover the cost of the ZIO monitor.  You must apply for the Patient Assistance Program to qualify for this discounted rate.  To apply, please call [employer] at [phone], select option 4, select option 2, ask to apply for  Patient Assistance Program. Meredeth Ide will ask your household income, and how many people  are  in your household. They will quote your out-of-pocket cost based on that information.  [employer] will also be able to set up a 9-month, interest-free payment plan if needed.  Applying the monitor   Shave hair from upper left chest.  Hold abrader disc by orange tab. Rub abrader in 40 strokes over the  upper left chest as  indicated in your monitor instructions.  Clean area with 4 enclosed alcohol pads. Let dry.  Apply patch as indicated in monitor instructions. Patch will be placed under collarbone on left  side of chest with arrow pointing upward.  Rub patch adhesive wings for 2 minutes. Remove white label marked "1". Remove the white  label marked "2". Rub patch adhesive wings for 2 additional minutes.  While looking in a mirror, press and release button in center of patch. A small green light will  flash 3-4 times. This will be your only indicator that the monitor has been turned on.  Do not shower for the first 24 hours. You may shower after the first 24 hours.  Press the button if you feel a symptom. You will hear a small click. Record Date, Time and  Symptom in the Patient Logbook.  When you are ready to remove the patch, follow instructions on the last 2 pages of Patient  Logbook. Stick patch monitor onto the last page of Patient Logbook.  Place Patient Logbook in the blue and white box. Use locking tab on box and tape box closed  securely. The blue and white box has prepaid postage on it. Please place it in the mailbox as  soon as possible. Your physician should have your test results approximately 7 days after the  monitor has been mailed back to [facility].  Call [facility] Customer Care at [phone] if you have questions regarding  your ZIO XT patch monitor. Call them immediately if you see an orange light blinking on your  monitor.  If your monitor falls off in less than 4 days, contact our Monitor department at [phone].  If your monitor becomes loose or falls off after 4 days call [employer] at [phone] for  suggestions on securing your monitor   Your next appointment:   3 month(s)  Provider:   Dr. Rollene Rotunda in [place]

## 2023-07-12 NOTE — Progress Notes (Unsigned)
Enrolled for [employer] to mail a ZIO XT long term holter monitor to the patients address on file.  

## 2023-07-13 LAB — CBC WITH DIFFERENTIAL/PLATELET
Basophils Absolute: 0 10*3/uL (ref 0.0–0.2)
Basos: 1 %
EOS (ABSOLUTE): 0.1 10*3/uL (ref 0.0–0.4)
Eos: 1 %
Hematocrit: 42.7 % (ref 34.0–46.6)
Hemoglobin: 14.1 g/dL (ref 11.1–15.9)
Immature Grans (Abs): 0 10*3/uL (ref 0.0–0.1)
Immature Granulocytes: 0 %
Lymphocytes Absolute: 2 10*3/uL (ref 0.7–3.1)
Lymphs: 29 %
MCH: 30.4 pg (ref 26.6–33.0)
MCHC: 33 g/dL (ref 31.5–35.7)
MCV: 92 fL (ref 79–97)
Monocytes Absolute: 0.7 10*3/uL (ref 0.1–0.9)
Monocytes: 10 %
Neutrophils Absolute: 4 10*3/uL (ref 1.4–7.0)
Neutrophils: 59 %
Platelets: 231 10*3/uL (ref 150–450)
RBC: 4.64 x10E6/uL (ref 3.77–5.28)
RDW: 13.7 % (ref 11.7–15.4)
WBC: 6.8 10*3/uL (ref 3.4–10.8)

## 2023-07-16 ENCOUNTER — Telehealth: Payer: Self-pay | Admitting: Cardiology

## 2023-07-16 NOTE — Progress Notes (Signed)
Cardiology Office Note:    Date:  07/16/2023   ID:  Kimberly Suarez, Kimberly Suarez August 19, 1941, MRN [id number]  PCP:  Raliegh Ip, DO  Cardiologist:  Thomasene Ripple, DO  Electrophysiologist:  None   Referring MD: Raliegh Ip, DO   " I am ok"  History of Present Illness:    Kimberly Suarez is a 82 y.o. female with a hx with a history of stage 3 kidney disease, Hypertension , presents after a recent diagnosis of atrial fibrillation (AFib) by her primary care doctor. The patient reports that her blood pressure has been low and her pulse rate increased to 98. She has been started on Eliquis and metoprolol for the AFib. The patient has not had any previous cardiac issues. The patient also mentions that heart disease runs in her family, with both her mother and siblings having heart trouble.  Past Medical History:  Diagnosis Date   ASCUS (atypical squamous cells of undetermined significance) on Pap smear 2011x2   Negative high-risk HPV, normal Pap smears 2012/2013   Atypical nevus 05/02/2007   right back-moderate    Cancer (HCC)    Cataract    Diverticulosis    GERD (gastroesophageal reflux disease)    Hiatal hernia    Hyperlipemia    Hypertension    IBS (irritable bowel syndrome)    Kidney disease    Lymphocytic colitis 2007   Neuropathy    SCC (squamous cell carcinoma) 01/19/2006   bridge of nose (CX35FU)   SCC (squamous cell carcinoma) 03/08/2017   right inner lower shin (CX35FU)   SCC (squamous cell carcinoma) x 2 02/24/2002   right bridge of nose (Cx35FU),right bridge of nose   Uterine prolapse    Pessary    Past Surgical History:  Procedure Laterality Date   BREAST BIOPSY     unsure which breast   CATARACT EXTRACTION W/ INTRAOCULAR LENS IMPLANT Bilateral     Current Medications: Current Meds  Medication Sig   acetaminophen (TYLENOL) 500 MG tablet Take 2 tablets (1,000 mg total) by mouth 3 (three) times daily.   Alpha-Lipoic Acid 600 MG CAPS Take by mouth.    apixaban (ELIQUIS) 5 MG TABS tablet Take 1 tablet (5 mg total) by mouth 2 (two) times daily. For afib   atorvastatin (LIPITOR) 40 MG tablet TAKE 1 TABLET BY MOUTH DAILY   enalapril (VASOTEC) 10 MG tablet TAKE 1 TABLET BY MOUTH TWICE  DAILY   estradiol (ESTRACE) 0.1 MG/GM vaginal cream Place 0.5g nightly for two weeks then twice a week after   famotidine (PEPCID) 20 MG tablet Take 1 tablet (20 mg total) by mouth every other day. ** PLEASE NOTE THIS IS ADJUSTED FOR KIDNEY FUNCTION   fluticasone (FLONASE) 50 MCG/ACT nasal spray Place 1 spray into both nostrils 2 (two) times daily as needed for allergies or rhinitis.   gabapentin (NEURONTIN) 100 MG capsule TAKE 3 CAPSULES BY MOUTH AT  BEDTIME   Glucosamine-Chondroitin (GLUCOSAMINE CHONDR COMPLEX PO) Take 1 tablet by mouth 2 (two) times daily.   hydrochlorothiazide (HYDRODIURIL) 25 MG tablet TAKE 1 TABLET BY MOUTH DAILY   metoprolol tartrate (LOPRESSOR) 25 MG tablet Take 0.5 tablets (12.5 mg total) by mouth 2 (two) times daily.   Multiple Vitamins-Minerals (MULTIVITAMIN ADULT PO) Take 1 tablet by mouth daily.   Omega-3 Fatty Acids (FISH OIL) 1000 MG CAPS Take 1 capsule by mouth daily.   saccharomyces boulardii (FLORASTOR) 250 MG capsule Take 250 mg by mouth 2 (two) times daily.  vitamin B-12 (CYANOCOBALAMIN) 1000 MCG tablet Take 1,000 mcg by mouth daily.   vitamin B-12 (CYANOCOBALAMIN) 500 MCG tablet Take 500 mcg by mouth daily.   VITAMIN D PO Take by mouth.     Allergies:   Patient has no known allergies.   Social History   Socioeconomic History   Marital status: Married    Spouse name: Not on file   Number of children: 3    Years of education: Not on file   Highest education level: 8th grade  Occupational History   Occupation: Retired   Tobacco Use   Smoking status: Never   Smokeless tobacco: Never  Vaping Use   Vaping status: Never Used  Substance and Sexual Activity   Alcohol use: Not Currently   Drug use: No   Sexual activity:  Not Currently    Birth control/protection: Post-menopausal    Comment: 1st intercourse 15 yo--1 partner  Other Topics Concern   Not on file  Social History Narrative   Lives at home with husband.   Right-handed.   Caffeine use: one cup caffeine some days.   Social Determinants of Health   Financial Resource Strain: Low Risk  (10/09/2018)   Overall Financial Resource Strain (CARDIA)    Difficulty of Paying Living Expenses: Not hard at all  Food Insecurity: No Food Insecurity (10/09/2018)   Hunger Vital Sign    Worried About Running Out of Food in the Last Year: Never true    Ran Out of Food in the Last Year: Never true  Transportation Needs: No Transportation Needs (10/09/2018)   PRAPARE - Administrator, Civil Service (Medical): No    Lack of Transportation (Non-Medical): No  Physical Activity: Insufficiently Active (10/09/2018)   Exercise Vital Sign    Days of Exercise per Week: 2 days    Minutes of Exercise per Session: 30 min  Stress: No Stress Concern Present (10/09/2018)   [employer] of Occupational Health - Occupational Stress Questionnaire    Feeling of Stress : Not at all  Social Connections: Moderately Integrated (10/09/2018)   Social Connection and Isolation Panel [NHANES]    Frequency of Communication with Friends and Family: More than three times a week    Frequency of Social Gatherings with Friends and Family: More than three times a week    Attends Religious Services: More than 4 times per year    Active Member of [employer] or Organizations: No    Attends Engineer, structural: Never    Marital Status: Married     Family History: The patient's family history includes Alzheimer's disease in her brother and sister; Asthma in her father; COPD in her father; Diabetes in her brother and brother; Early death in her brother; Heart disease in her mother and sister; Multiple sclerosis in her sister. There is no history of Colon cancer, Kidney disease, or  Liver disease.  ROS:   Review of Systems  Constitution: Negative for decreased appetite, fever and weight gain.  HENT: Negative for congestion, ear discharge, hoarse voice and sore throat.   Eyes: Negative for discharge, redness, vision loss in right eye and visual halos.  Cardiovascular: Negative for chest pain, dyspnea on exertion, leg swelling, orthopnea and palpitations.  Respiratory: Negative for cough, hemoptysis, shortness of breath and snoring.   Endocrine: Negative for heat intolerance and polyphagia.  Hematologic/Lymphatic: Negative for bleeding problem. Does not bruise/bleed easily.  Skin: Negative for flushing, nail changes, rash and suspicious lesions.  Musculoskeletal: Negative for arthritis,  joint pain, muscle cramps, myalgias, neck pain and stiffness.  Gastrointestinal: Negative for abdominal pain, bowel incontinence, diarrhea and excessive appetite.  Genitourinary: Negative for decreased libido, genital sores and incomplete emptying.  Neurological: Negative for brief paralysis, focal weakness, headaches and loss of balance.  Psychiatric/Behavioral: Negative for altered mental status, depression and suicidal ideas.  Allergic/Immunologic: Negative for HIV exposure and persistent infections.    EKGs/Labs/Other Studies Reviewed:    The following studies were reviewed today:   EKG:  The ekg ordered today demonstrates   Recent Labs: 10/18/2022: TSH 2.040 07/05/2023: ALT 26; BUN 23; Creatinine, Ser 1.24; Potassium 4.4; Sodium 134 07/12/2023: Hemoglobin 14.1; Platelets 231  Recent Lipid Panel    Component Value Date/Time   CHOL 145 10/18/2022 0953   CHOL 138 12/02/2012 0857   TRIG 130 10/18/2022 0953   TRIG 139 10/30/2016 1653   TRIG 102 12/02/2012 0857   HDL 50 10/18/2022 0953   HDL 43 10/30/2016 1653   HDL 50 12/02/2012 0857   CHOLHDL 2.9 10/18/2022 0953   LDLCALC 72 10/18/2022 0953   LDLCALC 69 05/18/2014 0816   LDLCALC 68 12/02/2012 0857    Physical Exam:     VS:  BP 128/70 (BP Location: Left Arm, Patient Position: Sitting, Cuff Size: Normal)   Pulse 71   Ht 5\' 4"  (1.626 m)   Wt 148 lb 6.4 oz (67.3 kg)   SpO2 93%   BMI 25.47 kg/m     Wt Readings from Last 3 Encounters:  07/12/23 148 lb 6.4 oz (67.3 kg)  07/05/23 147 lb (66.7 kg)  06/26/23 147 lb (66.7 kg)     GEN: Well nourished, well developed in no acute distress HEENT: Normal NECK: No JVD; No carotid bruits LYMPHATICS: No lymphadenopathy CARDIAC: S1S2 noted,RRR, no murmurs, rubs, gallops RESPIRATORY:  Clear to auscultation without rales, wheezing or rhonchi  ABDOMEN: Soft, non-tender, non-distended, +bowel sounds, no guarding. EXTREMITIES: No edema, No cyanosis, no clubbing MUSCULOSKELETAL:  No deformity  SKIN: Warm and dry NEUROLOGIC:  Alert and oriented x 3, non-focal PSYCHIATRIC:  Normal affect, good insight  ASSESSMENT:    1. Medication management   2. Paroxysmal atrial fibrillation (HCC)   3. Primary hypertension   4. Hypertension, unspecified type   5. Stage 3 chronic kidney disease, unspecified whether stage 3a or 3b CKD (HCC)   6. Chronic anticoagulation    PLAN:    Paroxysmal Atrial Fibrillation Newly diagnosed with episodes of increased heart rate. Currently on Eliquis and Metoprolol. Reviewed EKGs confirming diagnosis. Discussed the need to understand frequency of AFib episodes and heart function. -Order 2-week heart monitor to assess frequency of AFib episodes. -Order echocardiogram to assess heart function. -Continue Eliquis and Metoprolol as prescribed.  Chronic Kidney Disease (Stage 3) Discussed potential impact of Eliquis on kidney function. Current kidney function within acceptable range for Eliquis dosing. -Order blood work to assess hemoglobin and kidney function.  Hypertension - blood pressure at target  Follow-up plan -Schedule follow-up appointment with Dr. Yorkville Lions in [place] in 3 months. -Communicate results of blood work and heart monitor  via [employer] and phone call.  The patient is in agreement with the above plan. The patient left the office in stable condition.  The patient will follow up in   Medication Adjustments/Labs and Tests Ordered: Current medicines are reviewed at length with the patient today.  Concerns regarding medicines are outlined above.  Orders Placed This Encounter  Procedures   CBC with Differential/Platelet   LONG TERM MONITOR (3-14 DAYS)  ECHOCARDIOGRAM COMPLETE   No orders of the defined types were placed in this encounter.   Patient Instructions  Medication Instructions:  Your physician recommends that you continue on your current medications as directed. Please refer to the Current Medication list given to you today.  *If you need a refill on your cardiac medications before your next appointment, please call your pharmacy*   Lab Work: CBC If you have labs (blood work) drawn today and your tests are completely normal, you will receive your results only by: MyChart Message (if you have MyChart) OR A paper copy in the mail If you have any lab test that is abnormal or we need to change your treatment, we will call you to review the results.   Testing/Procedures: Your physician has requested that you have an echocardiogram. Echocardiography is a painless test that uses sound waves to create images of your heart. It provides your doctor with information about the size and shape of your heart and how well your heart's chambers and valves are working. This procedure takes approximately one hour. There are no restrictions for this procedure. Please do NOT wear cologne, perfume, aftershave, or lotions (deodorant is allowed). Please arrive 15 minutes prior to your appointment time.  Please note: We ask at that you not bring children with you during ultrasound (echo/ vascular) testing. Due to room size and safety concerns, children are not allowed in the ultrasound rooms during exams. Our front office  staff cannot provide observation of children in our lobby area while testing is being conducted. An adult accompanying a patient to their appointment will only be allowed in the ultrasound room at the discretion of the ultrasound technician under special circumstances. We apologize for any inconvenience.    Follow-Up: At [facility], you and your health needs are our priority.  As part of our continuing mission to provide you with exceptional heart care, we have created designated Provider Care Teams.  These Care Teams include your primary Cardiologist (physician) and Advanced Practice Providers (APPs -  Physician Assistants and Nurse Practitioners) who all work together to provide you with the care you need, when you need it. ZIO XT- Long Term Monitor Instructions  Your physician has requested you wear a ZIO patch monitor for 14 days.  This is a single patch monitor. [employer] supplies one patch monitor per enrollment. Additional stickers are not available. Please do not apply patch if you will be having a Nuclear Stress Test,  Echocardiogram, Cardiac CT, MRI, or Chest Xray during the period you would be wearing the  monitor. The patch cannot be worn during these tests. You cannot remove and re-apply the  ZIO XT patch monitor.  Your ZIO patch monitor will be mailed 3 day USPS to your address on file. It may take 3-5 days  to receive your monitor after you have been enrolled.  Once you have received your monitor, please review the enclosed instructions. Your monitor  has already been registered assigning a specific monitor serial # to you.  Billing and Patient Assistance Program Information  We have supplied [employer] with any of your insurance information on file for billing purposes. [employer] offers a sliding scale Patient Assistance Program for patients that do not have  insurance, or whose insurance does not completely cover the cost of the ZIO monitor.  You must apply for the  Patient Assistance Program to qualify for this discounted rate.  To apply, please call [employer] at [phone], select option 4, select option  2, ask to apply for  Patient Assistance Program. Meredeth Ide will ask your household income, and how many people  are in your household. They will quote your out-of-pocket cost based on that information.  [employer] will also be able to set up a 13-month, interest-free payment plan if needed.  Applying the monitor   Shave hair from upper left chest.  Hold abrader disc by orange tab. Rub abrader in 40 strokes over the upper left chest as  indicated in your monitor instructions.  Clean area with 4 enclosed alcohol pads. Let dry.  Apply patch as indicated in monitor instructions. Patch will be placed under collarbone on left  side of chest with arrow pointing upward.  Rub patch adhesive wings for 2 minutes. Remove white label marked "1". Remove the white  label marked "2". Rub patch adhesive wings for 2 additional minutes.  While looking in a mirror, press and release button in center of patch. A small green light will  flash 3-4 times. This will be your only indicator that the monitor has been turned on.  Do not shower for the first 24 hours. You may shower after the first 24 hours.  Press the button if you feel a symptom. You will hear a small click. Record Date, Time and  Symptom in the Patient Logbook.  When you are ready to remove the patch, follow instructions on the last 2 pages of Patient  Logbook. Stick patch monitor onto the last page of Patient Logbook.  Place Patient Logbook in the blue and white box. Use locking tab on box and tape box closed  securely. The blue and white box has prepaid postage on it. Please place it in the mailbox as  soon as possible. Your physician should have your test results approximately 7 days after the  monitor has been mailed back to [facility].  Call [facility] Customer Care at [phone] if you have  questions regarding  your ZIO XT patch monitor. Call them immediately if you see an orange light blinking on your  monitor.  If your monitor falls off in less than 4 days, contact our Monitor department at [phone].  If your monitor becomes loose or falls off after 4 days call [employer] at [phone] for  suggestions on securing your monitor   Your next appointment:   3 month(s)  Provider:   Dr. Rollene Rotunda in [place]    Adopting a Healthy Lifestyle.  Know what a healthy weight is for you (roughly BMI <25) and aim to maintain this   Aim for 7+ servings of fruits and vegetables daily   65-80+ fluid ounces of water or unsweet tea for healthy kidneys   Limit to max 1 drink of alcohol per day; avoid smoking/tobacco   Limit animal fats in diet for cholesterol and heart health - choose grass fed whenever available   Avoid highly processed foods, and foods high in saturated/trans fats   Aim for low stress - take time to unwind and care for your mental health   Aim for 150 min of moderate intensity exercise weekly for heart health, and weights twice weekly for bone health   Aim for 7-9 hours of sleep daily   When it comes to diets, agreement about the perfect plan isnt easy to find, even among the experts. Experts at the [facility] of [employer] developed an idea known as the Healthy Eating Plate. Just imagine a plate divided into logical, healthy portions.   The emphasis is on  diet quality:   Load up on vegetables and fruits - one-half of your plate: Aim for color and variety, and remember that potatoes dont count.   Go for whole grains - one-quarter of your plate: Whole wheat, barley, wheat berries, quinoa, oats, brown rice, and foods made with them. If you want pasta, go with whole wheat pasta.   Protein power - one-quarter of your plate: Fish, chicken, beans, and nuts are all healthy, versatile protein sources. Limit red meat.   The diet, however, does go  beyond the plate, offering a few other suggestions.   Use healthy plant oils, such as olive, canola, soy, corn, sunflower and peanut. Check the labels, and avoid partially hydrogenated oil, which have unhealthy trans fats.   If youre thirsty, drink water. Coffee and tea are good in moderation, but skip sugary drinks and limit milk and dairy products to one or two daily servings.   The type of carbohydrate in the diet is more important than the amount. Some sources of carbohydrates, such as vegetables, fruits, whole grains, and beans-are healthier than others.   Finally, stay active  Signed, Thomasene Ripple, DO  07/16/2023 6:37 PM    [facility]

## 2023-07-16 NOTE — Telephone Encounter (Signed)
Spoke to patient and advised not to wear monitor until after her ECHO. She is scheduled to wear monitor for 14 days. She also asked if she could change her ECHO location to [facility]. Message sent to Thea Alken to reschedule.

## 2023-07-16 NOTE — Telephone Encounter (Signed)
Pt called in stating she received heart monitor but she is supposed to have Echo 11/26. She wants to know if she should wait to wear heart monitor until after or she can push echo back. Please advise.

## 2023-07-18 ENCOUNTER — Other Ambulatory Visit: Payer: Self-pay | Admitting: Family Medicine

## 2023-07-23 DIAGNOSIS — M1990 Unspecified osteoarthritis, unspecified site: Secondary | ICD-10-CM | POA: Diagnosis not present

## 2023-07-23 DIAGNOSIS — E785 Hyperlipidemia, unspecified: Secondary | ICD-10-CM | POA: Diagnosis not present

## 2023-07-23 DIAGNOSIS — R7303 Prediabetes: Secondary | ICD-10-CM | POA: Diagnosis not present

## 2023-07-23 DIAGNOSIS — D472 Monoclonal gammopathy: Secondary | ICD-10-CM | POA: Diagnosis not present

## 2023-07-23 DIAGNOSIS — N1831 Chronic kidney disease, stage 3a: Secondary | ICD-10-CM | POA: Diagnosis not present

## 2023-07-23 DIAGNOSIS — I129 Hypertensive chronic kidney disease with stage 1 through stage 4 chronic kidney disease, or unspecified chronic kidney disease: Secondary | ICD-10-CM | POA: Diagnosis not present

## 2023-07-23 DIAGNOSIS — K219 Gastro-esophageal reflux disease without esophagitis: Secondary | ICD-10-CM | POA: Diagnosis not present

## 2023-07-24 ENCOUNTER — Ambulatory Visit: Payer: Medicare Other | Admitting: Family Medicine

## 2023-07-24 ENCOUNTER — Other Ambulatory Visit: Payer: Self-pay | Admitting: Family Medicine

## 2023-07-24 ENCOUNTER — Other Ambulatory Visit: Payer: Medicare Other

## 2023-07-24 DIAGNOSIS — I4891 Unspecified atrial fibrillation: Secondary | ICD-10-CM

## 2023-07-24 MED ORDER — METOPROLOL TARTRATE 25 MG PO TABS: 12.5000 mg | ORAL_TABLET | Freq: Two times a day (BID) | ORAL | 0 refills | Status: DC

## 2023-07-24 MED ORDER — APIXABAN 5 MG PO TABS: 5.0000 mg | ORAL_TABLET | Freq: Two times a day (BID) | ORAL | 0 refills | Status: DC

## 2023-07-25 ENCOUNTER — Ambulatory Visit (HOSPITAL_COMMUNITY)
Admission: RE | Admit: 2023-07-25 | Discharge: 2023-07-25 | Disposition: A | Discharge status: Home / Self Care | Payer: Medicare Other | Source: Ambulatory Visit | Attending: Cardiology | Admitting: Cardiology

## 2023-07-25 DIAGNOSIS — I480 Paroxysmal atrial fibrillation: Secondary | ICD-10-CM | POA: Diagnosis present

## 2023-07-25 LAB — ECHOCARDIOGRAM COMPLETE
AR max vel: 1.65 cm2
AV Area VTI: 1.47 cm2
AV Area mean vel: 1.59 cm2
AV Mean grad: 3 mmHg
AV Peak grad: 5.5 mmHg
Ao pk vel: 1.17 m/s
Area-P 1/2: 3.48 cm2
S' Lateral: 3.35 cm

## 2023-07-26 DIAGNOSIS — I480 Paroxysmal atrial fibrillation: Secondary | ICD-10-CM | POA: Diagnosis not present

## 2023-08-14 ENCOUNTER — Ambulatory Visit: Payer: Self-pay | Admitting: Cardiology

## 2023-08-14 DIAGNOSIS — I480 Paroxysmal atrial fibrillation: Secondary | ICD-10-CM | POA: Diagnosis not present

## 2023-09-07 ENCOUNTER — Telehealth: Payer: Self-pay | Admitting: Cardiology

## 2023-09-07 NOTE — Telephone Encounter (Signed)
 Called and spoke with patient. Verified name and DOB. Patient calling in for results to ECHO and Monitor. Below message relayed for ECHO per Dr Sheena. Told patient results for Monitor are not back at this time. Will send to Dr Sheena for review.    Kimberly Tobb, DO 08/06/2023  8:20 AM EST   Echo normal

## 2023-09-07 NOTE — Telephone Encounter (Signed)
Patient is requesting call back to go over results.  

## 2023-09-11 NOTE — Telephone Encounter (Signed)
 Called pt, set up an appointment with Dr. Servando Salina for Friday

## 2023-09-14 ENCOUNTER — Encounter: Payer: Self-pay | Admitting: Cardiology

## 2023-09-14 ENCOUNTER — Ambulatory Visit: Payer: Medicare Other | Attending: Cardiovascular Disease | Admitting: Cardiology

## 2023-09-14 VITALS — BP 113/71 | HR 65 | Ht 64.0 in | Wt 148.0 lb

## 2023-09-14 DIAGNOSIS — I480 Paroxysmal atrial fibrillation: Secondary | ICD-10-CM | POA: Diagnosis not present

## 2023-09-14 DIAGNOSIS — Z7901 Long term (current) use of anticoagulants: Secondary | ICD-10-CM | POA: Diagnosis not present

## 2023-09-14 DIAGNOSIS — N183 Chronic kidney disease, stage 3 unspecified: Secondary | ICD-10-CM

## 2023-09-14 MED ORDER — METOPROLOL SUCCINATE ER 25 MG PO TB24: 25.0000 mg | ORAL_TABLET | Freq: Every day | ORAL | 3 refills | Status: DC

## 2023-09-14 NOTE — Patient Instructions (Addendum)
Medication Instructions:  Your physician has recommended you make the following change in your medication:  STOP: Lopressor START: Toprol-XL 25 mg once daily  *If you need a refill on your cardiac medications before your next appointment, please call your pharmacy*  Follow-Up: At [facility], you and your health needs are our priority.  As part of our continuing mission to provide you with exceptional heart care, we have created designated Provider Care Teams.  These Care Teams include your primary Cardiologist (physician) and Advanced Practice Providers (APPs -  Physician Assistants and Nurse Practitioners) who all work together to provide you with the care you need, when you need it.  Your next appointment:    Keep appointment   Provider:   Rollene Rotunda, MD     Other Instructions

## 2023-09-14 NOTE — Progress Notes (Unsigned)
Virtual Visit via Telephone Note   Because of Lutricia Hickson Schueller's co-morbid illnesses, she is at least at moderate risk for complications without adequate follow up.  This format is felt to be most appropriate for this patient at this time.  The patient did not have access to video technology/had technical difficulties with video requiring transitioning to audio format only (telephone).  All issues noted in this document were discussed and addressed.  No physical exam could be performed with this format.  Please refer to the patient's chart for her consent to telehealth for [facility].   Date:  09/16/2023   ID:  Kimberly Suarez, Kimberly Suarez May 20, 1941, MRN [id number]  Patient Location: Home Provider Location: Office/Clinic  Virtual Visit via telephone  Note . I connected with the patient today by a telephone enabled telemedicine application and verified that I am speaking with the correct person using two identifiers.   PCP:  Raliegh Ip, DO  Cardiologist:  Rollene Rotunda, MD  Electrophysiologist:  None   Evaluation Performed:  Follow-Up Visit  Chief Complaint:  " I am ok"  History of Present Illness:    GLADYSE DUREE is a 83 y.o. female with a history of stage 3 kidney disease, Hypertension , presents after a recent diagnosis of atrial fibrillation. At her initial visit with me, I placed a monitor on the patient . She is here today for a follow up visit to discuss her monitor.   From her description of what is happening at home she is symptomatic. She reports fatigue and tells me she feels her heart our is out of rhythm and racing.    She is symptomatic, experiencing discomfort that is likely due to AFib. The doctor proposes a change to a longer-acting metoprolol for 24-hour coverage and the addition of a medication to prevent AFib episodes. The doctor suggests flecainide, a medication that has been on the market for many years and is indicated for persistent atrial  fibrillation. However, before starting flecainide, the doctor wants to ensure the patient has no blockages and plans to order a stress test. The patient is also transitioning to a new doctor, who will be involved in the decision-making process regarding the use of flecainide.  The patient does not have symptoms concerning for COVID-19 infection (fever, chills, cough, or new shortness of breath).    Past Medical History:  Diagnosis Date   ASCUS (atypical squamous cells of undetermined significance) on Pap smear 2011x2   Negative high-risk HPV, normal Pap smears 2012/2013   Atypical nevus 05/02/2007   right back-moderate    Cancer (HCC)    Cataract    Diverticulosis    GERD (gastroesophageal reflux disease)    Hiatal hernia    Hyperlipemia    Hypertension    IBS (irritable bowel syndrome)    Kidney disease    Lymphocytic colitis 2007   Neuropathy    SCC (squamous cell carcinoma) 01/19/2006   bridge of nose (CX35FU)   SCC (squamous cell carcinoma) 03/08/2017   right inner lower shin (CX35FU)   SCC (squamous cell carcinoma) x 2 02/24/2002   right bridge of nose (Cx35FU),right bridge of nose   Uterine prolapse    Pessary   Past Surgical History:  Procedure Laterality Date   BREAST BIOPSY     unsure which breast   CATARACT EXTRACTION W/ INTRAOCULAR LENS IMPLANT Bilateral      Current Meds  Medication Sig   acetaminophen (TYLENOL) 500 MG tablet Take 2  tablets (1,000 mg total) by mouth 3 (three) times daily.   Alpha-Lipoic Acid 600 MG CAPS Take by mouth.   apixaban (ELIQUIS) 5 MG TABS tablet Take 1 tablet (5 mg total) by mouth 2 (two) times daily. For afib   atorvastatin (LIPITOR) 40 MG tablet TAKE 1 TABLET BY MOUTH DAILY   enalapril (VASOTEC) 10 MG tablet TAKE 1 TABLET BY MOUTH TWICE  DAILY   estradiol (ESTRACE) 0.1 MG/GM vaginal cream Place 0.5g nightly for two weeks then twice a week after   famotidine (PEPCID) 20 MG tablet Take 1 tablet (20 mg total) by mouth every other  day. ** PLEASE NOTE THIS IS ADJUSTED FOR KIDNEY FUNCTION   fluticasone (FLONASE) 50 MCG/ACT nasal spray Place 1 spray into both nostrils 2 (two) times daily as needed for allergies or rhinitis.   gabapentin (NEURONTIN) 100 MG capsule TAKE 3 CAPSULES BY MOUTH AT  BEDTIME   Glucosamine-Chondroitin (GLUCOSAMINE CHONDR COMPLEX PO) Take 1 tablet by mouth 2 (two) times daily.   metoprolol succinate (TOPROL XL) 25 MG 24 hr tablet Take 1 tablet (25 mg total) by mouth daily.   Multiple Vitamins-Minerals (MULTIVITAMIN ADULT PO) Take 1 tablet by mouth daily.   Omega-3 Fatty Acids (FISH OIL) 1000 MG CAPS Take 1 capsule by mouth daily.   saccharomyces boulardii (FLORASTOR) 250 MG capsule Take 250 mg by mouth 2 (two) times daily.   VITAMIN D PO Take by mouth.   [DISCONTINUED] metoprolol tartrate (LOPRESSOR) 25 MG tablet Take 0.5 tablets (12.5 mg total) by mouth 2 (two) times daily.     Allergies:   Patient has no known allergies.   Social History   Tobacco Use   Smoking status: Never   Smokeless tobacco: Never  Vaping Use   Vaping status: Never Used  Substance Use Topics   Alcohol use: Not Currently   Drug use: No     Family Hx: The patient's family history includes Alzheimer's disease in her brother and sister; Asthma in her father; COPD in her father; Diabetes in her brother and brother; Early death in her brother; Heart disease in her mother and sister; Multiple sclerosis in her sister. There is no history of Colon cancer, Kidney disease, or Liver disease.  ROS:   Please see the history of present illness.    Palpations, fatigue and shortness of breath All other systems reviewed and are negative.   Prior CV studies:   The following studies were reviewed today:  Zio monitor   Labs/Other Tests and Data Reviewed:    EKG:  No ECG reviewed.  Recent Labs: 10/18/2022: TSH 2.040 07/05/2023: ALT 26; BUN 23; Creatinine, Ser 1.24; Potassium 4.4; Sodium 134 07/12/2023: Hemoglobin 14.1;  Platelets 231   Recent Lipid Panel Lab Results  Component Value Date/Time   CHOL 145 10/18/2022 09:53 AM   CHOL 138 12/02/2012 08:57 AM   TRIG 130 10/18/2022 09:53 AM   TRIG 139 10/30/2016 04:53 PM   TRIG 102 12/02/2012 08:57 AM   HDL 50 10/18/2022 09:53 AM   HDL 43 10/30/2016 04:53 PM   HDL 50 12/02/2012 08:57 AM   CHOLHDL 2.9 10/18/2022 09:53 AM   LDLCALC 72 10/18/2022 09:53 AM   LDLCALC 69 05/18/2014 08:16 AM   LDLCALC 68 12/02/2012 08:57 AM    Wt Readings from Last 3 Encounters:  09/14/23 148 lb (67.1 kg)  07/12/23 148 lb 6.4 oz (67.3 kg)  07/05/23 147 lb (66.7 kg)     Objective:    Vital Signs:  BP  113/71 (BP Location: Left Arm, Patient Position: Sitting, Cuff Size: Normal)   Pulse 65   Ht 5\' 4"  (1.626 m)   Wt 148 lb (67.1 kg)   BMI 25.40 kg/m      ASSESSMENT & PLAN:    PAF - she is symptomatic with current rate control strategy. Discussed transitioning to rhythm control strategy with Flecainide, pending rule out of coronary artery disease with stress test. Also discussed switching from short-acting Metoprolol (Lopressor) to long-acting Metoprolol (Toprol XL) for better rate control.  She is transitioning her care to Dr. [place] Lions and prefers to continue discussion on rhythm strategy.  So for today will transition her from lopressor to toprol xl for rate control and cont her Eliquis 5 mg daily.  Anticoagulation On Eliquis for stroke prevention in the setting of Atrial Fibrillation. -Continue Eliquis as prescribed.    COVID-19 Education: The signs and symptoms of COVID-19 were discussed with the patient and how to seek care for testing (follow up with PCP or arrange E-visit).  The importance of social distancing was discussed today.  Time:   Today, I have spent 20 minutes with the patient with telehealth technology discussing the above problems.     Medication Adjustments/Labs and Tests Ordered: Current medicines are reviewed at length with the patient today.   Concerns regarding medicines are outlined above.   Tests Ordered: No orders of the defined types were placed in this encounter.   Medication Changes: Meds ordered this encounter  Medications   metoprolol succinate (TOPROL XL) 25 MG 24 hr tablet    Sig: Take 1 tablet (25 mg total) by mouth daily.    Dispense:  90 tablet    Refill:  3    Follow Up:   will follow with Dr. [place] Lions - schedule for April 2025    Mellody Drown [place], DO  09/16/2023 3:12 PM    [facility]

## 2023-09-27 ENCOUNTER — Other Ambulatory Visit: Payer: Self-pay | Admitting: Family Medicine

## 2023-10-15 ENCOUNTER — Encounter: Payer: Self-pay | Admitting: Family Medicine

## 2023-10-15 ENCOUNTER — Other Ambulatory Visit: Payer: Medicare Other

## 2023-10-15 DIAGNOSIS — R7303 Prediabetes: Secondary | ICD-10-CM

## 2023-10-15 LAB — BAYER DCA HB A1C WAIVED: HB A1C (BAYER DCA - WAIVED): 5.9 % — ABNORMAL HIGH (ref 4.8–5.6)

## 2023-10-16 ENCOUNTER — Telehealth: Payer: Self-pay | Admitting: Cardiology

## 2023-10-16 NOTE — Telephone Encounter (Signed)
Kimberly Albee, PA-C to Me     10/16/23  1:54 PM Kimberly Suarez is currently out of the office. From my review of her note, looks like the stress test would have been ordered if the patient was agreeable to starting flecainide. Since they decided to stick with metoprolol for now, the stress test was not ordered. Sounds like patient wanted to continue to discuss flecainide with Dr. Antoine Poche when she establishes with him in April, and a stress test would be ordered at that time.  Also- I see that she called the office today with some shortness of breath. Looks like that has not been discussed with Kimberly Suarez recently, I suspect she will need to be seen by an APP within the next week or 2  Thanks KJ __________________________________________________________________ Patient identification verified by 2 forms. Kimberly Rail, RN    Called and spoke to patient  Relayed provider message Patient states:   -when she lays down on her back or side she feels like she is being smothered   -feels better sitting up compared to laying down   -symptom also occurs when walking   -has a lot of stomach bloating right under her chest   -sx started over a month ago  -HR mainly in the 60s   -117/60 HR 55  -takes Metoprolol 25mg  daily   -takes Eliquis only once a day  Patient denies:   -palpitations  Advised patient to take Eliquis twice daily, she agrees  Patient scheduled for OV 2/21 at 1:55pm with NP west  Reviewed ED warning signs/precautions  Patient verbalized understanding, no questions at this time

## 2023-10-16 NOTE — Telephone Encounter (Signed)
New Message:     Patient said when she had her last visit with Dr Servando Salina, she thought Dr Servando Salina said she was going to order a Stress Test. She would like to hava a Stress Test asap please.

## 2023-10-16 NOTE — Telephone Encounter (Signed)
Pt c/o Shortness Of Breath: STAT if SOB developed within the last 24 hours or pt is noticeably SOB on the phone  1. Are you currently SOB (can you hear that pt is SOB on the phone)? No- feels like she is smothering when she lays down-   2. How long have you been experiencing SOB? Since October- had quite a few episodes lately  3. Are you SOB when sitting or when up moving around?  When she is laying down- feels like she is smothering  4. Are you currently experiencing any other symptoms?  She says she feels weak and tired sometimes

## 2023-10-16 NOTE — Telephone Encounter (Signed)
Please see documentation in alternate 2/18 telephone encounter

## 2023-10-19 ENCOUNTER — Ambulatory Visit: Payer: Medicare Other | Admitting: Family Medicine

## 2023-10-19 ENCOUNTER — Ambulatory Visit: Payer: Self-pay | Admitting: Family Medicine

## 2023-10-19 ENCOUNTER — Encounter: Payer: Self-pay | Admitting: Family Medicine

## 2023-10-19 ENCOUNTER — Ambulatory Visit: Payer: Medicare Other | Admitting: Cardiology

## 2023-10-19 VITALS — BP 122/62 | HR 59 | Temp 98.1°F | Ht 64.0 in | Wt 150.0 lb

## 2023-10-19 DIAGNOSIS — R8290 Unspecified abnormal findings in urine: Secondary | ICD-10-CM | POA: Diagnosis not present

## 2023-10-19 DIAGNOSIS — R300 Dysuria: Secondary | ICD-10-CM | POA: Diagnosis not present

## 2023-10-19 DIAGNOSIS — B379 Candidiasis, unspecified: Secondary | ICD-10-CM | POA: Diagnosis not present

## 2023-10-19 LAB — MICROSCOPIC EXAMINATION: Renal Epithel, UA: NONE SEEN /HPF

## 2023-10-19 LAB — URINALYSIS, ROUTINE W REFLEX MICROSCOPIC
Bilirubin, UA: NEGATIVE
Glucose, UA: NEGATIVE
Ketones, UA: NEGATIVE
Nitrite, UA: NEGATIVE
Protein,UA: NEGATIVE
Specific Gravity, UA: 1.01 (ref 1.005–1.030)
Urobilinogen, Ur: 0.2 mg/dL (ref 0.2–1.0)
pH, UA: 7 (ref 5.0–7.5)

## 2023-10-19 MED ORDER — CEPHALEXIN 500 MG PO CAPS: 500.0000 mg | ORAL_CAPSULE | Freq: Two times a day (BID) | ORAL | 0 refills | Status: AC

## 2023-10-19 MED ORDER — FLUCONAZOLE 150 MG PO TABS: 150.0000 mg | ORAL_TABLET | Freq: Once | ORAL | 0 refills | Status: AC

## 2023-10-19 NOTE — Progress Notes (Signed)
   Acute Office Visit  Subjective:     Patient ID: Kimberly Suarez, female    DOB: September 09, 1940, 83 y.o.   MRN: [id number]  Chief Complaint  Patient presents with   Dysuria   cloudy urine    Dysuria  This is a new problem. The current episode started yesterday. The problem has been gradually worsening. The quality of the pain is described as burning. The patient is experiencing no pain. Associated symptoms include frequency and urgency. Pertinent negatives include no chills, discharge, flank pain, hematuria, nausea or vomiting. Associated symptoms comments: Cloudy urine. She has tried nothing for the symptoms. There is no history of recurrent UTIs.     Review of Systems  Constitutional:  Negative for chills.  Gastrointestinal:  Negative for nausea and vomiting.  Genitourinary:  Positive for dysuria, frequency and urgency. Negative for flank pain and hematuria.        Objective:    BP 122/62   Pulse (!) 59   Temp 98.1 F (36.7 C) (Temporal)   Ht 5\' 4"  (1.626 m)   Wt 150 lb (68 kg)   SpO2 98%   BMI 25.75 kg/m    Physical Exam Vitals and nursing note reviewed.  Constitutional:      General: She is not in acute distress.    Appearance: Normal appearance. She is not ill-appearing, toxic-appearing or diaphoretic.  Cardiovascular:     Rate and Rhythm: Normal rate and regular rhythm.     Heart sounds: Normal heart sounds. No murmur heard. Pulmonary:     Effort: Pulmonary effort is normal. No respiratory distress.     Breath sounds: Normal breath sounds. No wheezing, rhonchi or rales.  Abdominal:     General: Bowel sounds are normal. There is no distension.     Tenderness: There is no abdominal tenderness. There is no right CVA tenderness, left CVA tenderness, guarding or rebound.  Musculoskeletal:     Right lower leg: No edema.     Left lower leg: No edema.  Skin:    General: Skin is warm and dry.  Neurological:     General: No focal deficit present.     Mental  Status: She is alert and oriented to person, place, and time.  Psychiatric:        Mood and Affect: Mood normal.        Behavior: Behavior normal.    Urine dipstick shows positive for RBC's and positive for leukocytes.  Micro exam: 11-30 WBC's per HPF, 0-2 RBC's per HPF, and few + bacteria. Yeast present.      Assessment & Plan:   Kimberly Suarez was seen today for dysuria and cloudy urine.  Diagnoses and all orders for this visit:  Dysuria Will treat with keflex for UTI pending urine culture given symptoms with bacteria and increase leuks.  -     Urinalysis, Routine w reflex microscopic -     Urine Culture -     cephALEXin (KEFLEX) 500 MG capsule; Take 1 capsule (500 mg total) by mouth 2 (two) times daily for 7 days.  Yeast detected Diflucan as below.  -     fluconazole (DIFLUCAN) 150 MG tablet; Take 1 tablet (150 mg total) by mouth once for 1 dose.   Return if symptoms worsen or fail to improve.  The patient indicates understanding of these issues and agrees with the plan.  Gabriel Earing, FNP

## 2023-10-19 NOTE — Telephone Encounter (Signed)
Chief Complaint: Urinary Symptoms Symptoms: Cloudy urine, foul odor, greasy film apperance, back pain Frequency: today Pertinent Negatives: Patient denies fever Disposition: [] ED /[] Urgent Care (no appt availability in office) / [x] Appointment(In office/virtual)/ []  [facility]/ [] Home Care/ [] Refused Recommended Disposition /[] [place]/ []  Follow-up with PCP Additional Notes: Patient called in stating she noticed her urine was cloudy this morning, with a foul odor, and had a greasy film appearance. Patient noticed burning with urination yesterday but not today. Patient states she has had intermittent lower back pain, but unsure if its related to urinary symptoms. Patient denies fever, blood in urine, abdominal pain, but states just a few days ago she was experiencing the chills. Patient has stage 3 CKD. Patient appt created for today for further evaluation.   Copied from CRM [id number]. Topic: Clinical - Red Word Triage >> Oct 19, 2023 11:12 AM Kristie Cowman wrote: Red Word that prompted transfer to Nurse Triage: Patient with kidney disease.  Noted cloudy urine this morning, also a greasy film to it. Reason for Disposition  Bad or foul-smelling urine  Answer Assessment - Initial Assessment Questions 1. SYMPTOM: "What's the main symptom you're concerned about?" (e.g., frequency, incontinence)     Cloudy urine, film appearance, lower back pain 2. ONSET: "When did the  cloudiness  start?"     today 3. PAIN: "Is there any pain?" If Yes, ask: "How bad is it?" (Scale: 1-10; mild, moderate, severe)     Mild  4. CAUSE: "What do you think is causing the symptoms?"     Unsure - patient has stage 3 CKD 5. OTHER SYMPTOMS: "Do you have any other symptoms?" (e.g., blood in urine, fever, flank pain, pain with urination)     Abnormal odor, cloudy urine, film appearance  Protocols used: Urinary Symptoms-A-AH

## 2023-10-20 LAB — URINE CULTURE

## 2023-10-26 ENCOUNTER — Ambulatory Visit: Payer: Medicare Other | Admitting: Family Medicine

## 2023-10-26 ENCOUNTER — Encounter: Payer: Self-pay | Admitting: Family Medicine

## 2023-10-26 VITALS — BP 116/63 | HR 77 | Temp 98.7°F | Ht 64.0 in | Wt 144.0 lb

## 2023-10-26 DIAGNOSIS — Z0001 Encounter for general adult medical examination with abnormal findings: Secondary | ICD-10-CM | POA: Diagnosis not present

## 2023-10-26 DIAGNOSIS — N1832 Chronic kidney disease, stage 3b: Secondary | ICD-10-CM

## 2023-10-26 DIAGNOSIS — E7800 Pure hypercholesterolemia, unspecified: Secondary | ICD-10-CM

## 2023-10-26 DIAGNOSIS — I10 Essential (primary) hypertension: Secondary | ICD-10-CM | POA: Diagnosis not present

## 2023-10-26 DIAGNOSIS — I480 Paroxysmal atrial fibrillation: Secondary | ICD-10-CM | POA: Diagnosis not present

## 2023-10-26 DIAGNOSIS — Z0000 Encounter for general adult medical examination without abnormal findings: Secondary | ICD-10-CM

## 2023-10-26 DIAGNOSIS — R7303 Prediabetes: Secondary | ICD-10-CM | POA: Diagnosis not present

## 2023-10-26 NOTE — Progress Notes (Signed)
 Kimberly Suarez is a 83 y.o. female presents to office today for annual physical exam examination.    Concerns today include: 1.  Diarrhea She reports that she has had mustard colored loose stools for the last 2 weeks.  Today was the first day where she was not having significant diarrhea.  She denies any blood in stool.  No associate abdominal pain, nausea or vomiting.  She continues to use her probiotic as directed.  Occupation: Retired, Marital status: Married, Substance use: None There are no preventive care reminders to display for this patient.  Refills needed today: None  Immunization History  Administered Date(s) Administered   Fluad Quad(high Dose 65+) 06/26/2019, 06/25/2020, 06/08/2021, 06/23/2022   Fluad Trivalent(High Dose 65+) 05/15/2023   Influenza, High Dose Seasonal PF 06/28/2015, 06/01/2016, 06/06/2017, 06/04/2018   Influenza,inj,Quad PF,6+ Mos 07/02/2013, 05/26/2014   Moderna Sars-Covid-2 Vaccination 12/23/2019, 01/20/2020, 09/07/2020   Pneumococcal Conjugate-13 07/02/2013   Pneumococcal Polysaccharide-23 01/25/2021   Td 10/18/2022   Tdap 05/29/2011   Zoster Recombinant(Shingrix) 04/05/2022, 08/24/2022   Past Medical History:  Diagnosis Date   ASCUS (atypical squamous cells of undetermined significance) on Pap smear 2011x2   Negative high-risk HPV, normal Pap smears 2012/2013   Atypical nevus 05/02/2007   right back-moderate    Cancer (HCC)    Cataract    Diverticulosis    GERD (gastroesophageal reflux disease)    Hiatal hernia    Hyperlipemia    Hypertension    IBS (irritable bowel syndrome)    Kidney disease    Lymphocytic colitis 2007   Neuropathy    SCC (squamous cell carcinoma) 01/19/2006   bridge of nose (CX35FU)   SCC (squamous cell carcinoma) 03/08/2017   right inner lower shin (CX35FU)   SCC (squamous cell carcinoma) x 2 02/24/2002   right bridge of nose (Cx35FU),right bridge of nose   Uterine prolapse    Pessary   Social History    Socioeconomic History   Marital status: Married    Spouse name: Not on file   Number of children: 3    Years of education: Not on file   Highest education level: 8th grade  Occupational History   Occupation: Retired   Tobacco Use   Smoking status: Never   Smokeless tobacco: Never  Vaping Use   Vaping status: Never Used  Substance and Sexual Activity   Alcohol use: Not Currently   Drug use: No   Sexual activity: Not Currently    Birth control/protection: Post-menopausal    Comment: 1st intercourse 15 yo--1 partner  Other Topics Concern   Not on file  Social History Narrative   Lives at home with husband.   Right-handed.   Caffeine use: one cup caffeine some days.   Social Drivers of Corporate investment banker Strain: Low Risk  (10/09/2018)   Overall Financial Resource Strain (CARDIA)    Difficulty of Paying Living Expenses: Not hard at all  Food Insecurity: No Food Insecurity (10/09/2018)   Hunger Vital Sign    Worried About Running Out of Food in the Last Year: Never true    Ran Out of Food in the Last Year: Never true  Transportation Needs: No Transportation Needs (10/09/2018)   PRAPARE - Administrator, Civil Service (Medical): No    Lack of Transportation (Non-Medical): No  Physical Activity: Insufficiently Active (10/09/2018)   Exercise Vital Sign    Days of Exercise per Week: 2 days    Minutes of Exercise per Session:  30 min  Stress: No Stress Concern Present (10/09/2018)   [employer] of Occupational Health - Occupational Stress Questionnaire    Feeling of Stress : Not at all  Social Connections: Moderately Integrated (10/09/2018)   Social Connection and Isolation Panel [NHANES]    Frequency of Communication with Friends and Family: More than three times a week    Frequency of Social Gatherings with Friends and Family: More than three times a week    Attends Religious Services: More than 4 times per year    Active Member of [employer] or  Organizations: No    Attends Banker Meetings: Never    Marital Status: Married  Catering manager Violence: Not At Risk (10/09/2018)   Humiliation, Afraid, Rape, and Kick questionnaire    Fear of Current or Ex-Partner: No    Emotionally Abused: No    Physically Abused: No    Sexually Abused: No   Past Surgical History:  Procedure Laterality Date   BREAST BIOPSY     unsure which breast   CATARACT EXTRACTION W/ INTRAOCULAR LENS IMPLANT Bilateral    Family History  Problem Relation Age of Onset   Heart disease Mother    Diabetes Brother        BKA   COPD Father    Asthma Father    Alzheimer's disease Sister    Alzheimer's disease Brother    Multiple sclerosis Sister    Heart disease Sister    Diabetes Brother    Early death Brother        died in war   Colon cancer Neg Hx    Kidney disease Neg Hx    Liver disease Neg Hx     Current Outpatient Medications:    acetaminophen (TYLENOL) 500 MG tablet, Take 2 tablets (1,000 mg total) by mouth 3 (three) times daily., Disp: 180 tablet, Rfl: PRN   Alpha-Lipoic Acid 600 MG CAPS, Take by mouth., Disp: , Rfl:    apixaban (ELIQUIS) 5 MG TABS tablet, Take 1 tablet (5 mg total) by mouth 2 (two) times daily. For afib, Disp: 180 tablet, Rfl: 0   atorvastatin (LIPITOR) 40 MG tablet, TAKE 1 TABLET BY MOUTH DAILY, Disp: 100 tablet, Rfl: 0   cephALEXin (KEFLEX) 500 MG capsule, Take 1 capsule (500 mg total) by mouth 2 (two) times daily for 7 days., Disp: 14 capsule, Rfl: 0   enalapril (VASOTEC) 10 MG tablet, TAKE 1 TABLET BY MOUTH TWICE  DAILY, Disp: 200 tablet, Rfl: 0   estradiol (ESTRACE) 0.1 MG/GM vaginal cream, Place 0.5g nightly for two weeks then twice a week after, Disp: 30 g, Rfl: 11   famotidine (PEPCID) 20 MG tablet, Take 1 tablet (20 mg total) by mouth every other day. ** PLEASE NOTE THIS IS ADJUSTED FOR KIDNEY FUNCTION, Disp: 45 tablet, Rfl: 3   fluticasone (FLONASE) 50 MCG/ACT nasal spray, Place 1 spray into both nostrils  2 (two) times daily as needed for allergies or rhinitis., Disp: 16 g, Rfl: 6   gabapentin (NEURONTIN) 100 MG capsule, TAKE 3 CAPSULES BY MOUTH AT  BEDTIME, Disp: 300 capsule, Rfl: 0   Glucosamine-Chondroitin (GLUCOSAMINE CHONDR COMPLEX PO), Take 1 tablet by mouth 2 (two) times daily., Disp: , Rfl:    Melatonin 10 MG CAPS, Take by mouth at bedtime and may repeat dose one time if needed., Disp: , Rfl:    metoprolol succinate (TOPROL XL) 25 MG 24 hr tablet, Take 1 tablet (25 mg total) by mouth daily., Disp: 90 tablet,  Rfl: 3   Multiple Vitamins-Minerals (MULTIVITAMIN ADULT PO), Take 1 tablet by mouth daily., Disp: , Rfl:    Omega-3 Fatty Acids (FISH OIL) 1000 MG CAPS, Take 1 capsule by mouth daily., Disp: , Rfl:    saccharomyces boulardii (FLORASTOR) 250 MG capsule, Take 250 mg by mouth 2 (two) times daily., Disp: , Rfl:    TURMERIC CURCUMIN PO, Take 1,500 mg by mouth 2 (two) times daily., Disp: , Rfl:    vitamin B-12 (CYANOCOBALAMIN) 1000 MCG tablet, Take 1,000 mcg by mouth daily., Disp: , Rfl:    vitamin B-12 (CYANOCOBALAMIN) 500 MCG tablet, Take 500 mcg by mouth daily., Disp: , Rfl:    VITAMIN D PO, Take by mouth., Disp: , Rfl:    hydrochlorothiazide (HYDRODIURIL) 25 MG tablet, TAKE 1 TABLET BY MOUTH DAILY (Patient not taking: Reported on 10/26/2023), Disp: 100 tablet, Rfl: 0  No Known Allergies   ROS: Review of Systems A comprehensive review of systems was negative except for: Genitourinary: positive for urinary incontinence    Physical exam BP 116/63   Pulse 77   Temp 98.7 F (37.1 C)   Ht 5\' 4"  (1.626 m)   Wt 144 lb (65.3 kg)   SpO2 96%   BMI 24.72 kg/m  General appearance: alert, cooperative, appears stated age, and no distress Head: Normocephalic, without obvious abnormality, atraumatic Eyes: negative findings: lids and lashes normal, conjunctivae and sclerae normal, corneas clear, and pupils equal, round, reactive to light and accomodation Ears: normal TM's and external ear  canals both ears Nose: Nares normal. Septum midline. Mucosa normal. No drainage or sinus tenderness. Throat: lips, mucosa, and tongue normal; teeth and gums normal Neck: no adenopathy, supple, symmetrical, trachea midline, and thyroid not enlarged, symmetric, no tenderness/mass/nodules Back:  Increased kyphosis of thoracic spine present Lungs: clear to auscultation bilaterally Heart: regular rate and rhythm, S1, S2 normal, no murmur, click, rub or gallop Abdomen: soft, non-tender; bowel sounds normal; no masses,  no organomegaly Extremities: extremities normal, atraumatic, no cyanosis or edema Pulses: 2+ and symmetric Skin: Skin color, texture, turgor normal. No rashes or lesions Lymph nodes: Cervical, supraclavicular, and axillary nodes normal. Neurologic: Grossly normal      10/26/2023   10:34 AM 10/26/2023   10:23 AM 07/05/2023    3:59 PM  Depression screen PHQ 2/9  Decreased Interest 0 0 0  Down, Depressed, Hopeless 0 0 0  PHQ - 2 Score 0 0 0  Altered sleeping 2 0 0  Tired, decreased energy 2 0 0  Change in appetite 0 0 0  Feeling bad or failure about yourself  0 0 0  Trouble concentrating 0 0 0  Moving slowly or fidgety/restless 0 0 0  Suicidal thoughts 0 0 0  PHQ-9 Score 4 0 0  Difficult doing work/chores Somewhat difficult  Not difficult at all      10/26/2023   10:34 AM 10/26/2023   10:23 AM 07/05/2023    3:59 PM 06/26/2023    1:44 PM  GAD 7 : Generalized Anxiety Score  Nervous, Anxious, on Edge 0 1 1 0  Control/stop worrying 2 1 1  0  Worry too much - different things 2 0 1 1  Trouble relaxing 0 1 2 1   Restless 0 0 0 0  Easily annoyed or irritable 0 0 0 0  Afraid - awful might happen 0 0 0 1  Total GAD 7 Score 4 3 5 3   Anxiety Difficulty Somewhat difficult   Somewhat difficult  Assessment/ Plan: Anselm Jungling here for annual physical exam.   Annual physical exam  Primary hypertension - Plan: CMP14+EGFR  Stage 3b chronic kidney disease (HCC) - Plan:  CMP14+EGFR, VITAMIN D 25 Hydroxy (Vit-D Deficiency, Fractures)  Pre-diabetes - Plan: CMP14+EGFR  Pure hypercholesterolemia - Plan: CMP14+EGFR, Lipid Panel  Paroxysmal atrial fibrillation (HCC) - Plan: CMP14+EGFR, TSH, Magnesium  Fasting labs collected.  Blood pressure controlled.  No changes needed.  Check renal function given CKD 3B, vitamin D level  She will continue metoprolol, HCTZ, Lipitor as directed.  Eliquis sample given today.  She was rate and rhythm controlled on exam.  Check TSH, magnesium and electrolytes  Counseled on healthy lifestyle choices, including diet (rich in fruits, vegetables and lean meats and low in salt and simple carbohydrates) and exercise (at least 30 minutes of moderate physical activity daily).  Patient to follow up 6 months for renal follow-up, sooner if concerns arise  Terrilee Dudzik M. Nadine Counts, DO

## 2023-10-26 NOTE — Patient Instructions (Signed)
 Preventive Care 43 Years and Older, Female Preventive care refers to lifestyle choices and visits with your health care provider that can promote health and wellness. Preventive care visits are also called wellness exams. What can I expect for my preventive care visit? Counseling Your health care provider may ask you questions about your: Medical history, including: Past medical problems. Family medical history. Pregnancy and menstrual history. History of falls. Current health, including: Memory and ability to understand (cognition). Emotional well-being. Home life and relationship well-being. Sexual activity and sexual health. Lifestyle, including: Alcohol, nicotine or tobacco, and drug use. Access to firearms. Diet, exercise, and sleep habits. Work and work Astronomer. Sunscreen use. Safety issues such as seatbelt and bike helmet use. Physical exam Your health care provider will check your: Height and weight. These may be used to calculate your BMI (body mass index). BMI is a measurement that tells if you are at a healthy weight. Waist circumference. This measures the distance around your waistline. This measurement also tells if you are at a healthy weight and may help predict your risk of certain diseases, such as type 2 diabetes and high blood pressure. Heart rate and blood pressure. Body temperature. Skin for abnormal spots. What immunizations do I need?  Vaccines are usually given at various ages, according to a schedule. Your health care provider will recommend vaccines for you based on your age, medical history, and lifestyle or other factors, such as travel or where you work. What tests do I need? Screening Your health care provider may recommend screening tests for certain conditions. This may include: Lipid and cholesterol levels. Hepatitis C test. Hepatitis B test. HIV (human immunodeficiency virus) test. STI (sexually transmitted infection) testing, if you are at  risk. Lung cancer screening. Colorectal cancer screening. Diabetes screening. This is done by checking your blood sugar (glucose) after you have not eaten for a while (fasting). Mammogram. Talk with your health care provider about how often you should have regular mammograms. BRCA-related cancer screening. This may be done if you have a family history of breast, ovarian, tubal, or peritoneal cancers. Bone density scan. This is done to screen for osteoporosis. Talk with your health care provider about your test results, treatment options, and if necessary, the need for more tests. Follow these instructions at home: Eating and drinking  Eat a diet that includes fresh fruits and vegetables, whole grains, lean protein, and low-fat dairy products. Limit your intake of foods with high amounts of sugar, saturated fats, and salt. Take vitamin and mineral supplements as recommended by your health care provider. Do not drink alcohol if your health care provider tells you not to drink. If you drink alcohol: Limit how much you have to 0-1 drink a day. Know how much alcohol is in your drink. In the [place] one drink equals one 12 oz bottle of beer (355 mL), one 5 oz glass of wine (148 mL), or one 1 oz glass of hard liquor (44 mL). Lifestyle Brush your teeth every morning and night with fluoride toothpaste. Floss one time each day. Exercise for at least 30 minutes 5 or more days each week. Do not use any products that contain nicotine or tobacco. These products include cigarettes, chewing tobacco, and vaping devices, such as e-cigarettes. If you need help quitting, ask your health care provider. Do not use drugs. If you are sexually active, practice safe sex. Use a condom or other form of protection in order to prevent STIs. Take aspirin only as told by  your health care provider. Make sure that you understand how much to take and what form to take. Work with your health care provider to find out whether it  is safe and beneficial for you to take aspirin daily. Ask your health care provider if you need to take a cholesterol-lowering medicine (statin). Find healthy ways to manage stress, such as: Meditation, yoga, or listening to music. Journaling. Talking to a trusted person. Spending time with friends and family. Minimize exposure to UV radiation to reduce your risk of skin cancer. Safety Always wear your seat belt while driving or riding in a vehicle. Do not drive: If you have been drinking alcohol. Do not ride with someone who has been drinking. When you are tired or distracted. While texting. If you have been using any mind-altering substances or drugs. Wear a helmet and other protective equipment during sports activities. If you have firearms in your house, make sure you follow all gun safety procedures. What's next? Visit your health care provider once a year for an annual wellness visit. Ask your health care provider how often you should have your eyes and teeth checked. Stay up to date on all vaccines. This information is not intended to replace advice given to you by your health care provider. Make sure you discuss any questions you have with your health care provider. Document Revised: 02/09/2021 Document Reviewed: 02/09/2021 Elsevier Patient Education  2024 [employer].

## 2023-10-27 LAB — LIPID PANEL
Chol/HDL Ratio: 3.4 ratio (ref 0.0–4.4)
Cholesterol, Total: 146 mg/dL (ref 100–199)
HDL: 43 mg/dL
LDL Chol Calc (NIH): 80 mg/dL (ref 0–99)
Triglycerides: 127 mg/dL (ref 0–149)
VLDL Cholesterol Cal: 23 mg/dL (ref 5–40)

## 2023-10-27 LAB — TSH: TSH: 1.51 u[IU]/mL (ref 0.450–4.500)

## 2023-10-27 LAB — CMP14+EGFR
ALT: 17 [IU]/L (ref 0–32)
AST: 24 [IU]/L (ref 0–40)
Albumin: 4.3 g/dL (ref 3.7–4.7)
Alkaline Phosphatase: 49 [IU]/L (ref 44–121)
BUN/Creatinine Ratio: 12 (ref 12–28)
BUN: 14 mg/dL (ref 8–27)
Bilirubin Total: 0.5 mg/dL (ref 0.0–1.2)
CO2: 25 mmol/L (ref 20–29)
Calcium: 9.9 mg/dL (ref 8.7–10.3)
Chloride: 97 mmol/L (ref 96–106)
Creatinine, Ser: 1.19 mg/dL — ABNORMAL HIGH (ref 0.57–1.00)
Globulin, Total: 2.6 g/dL (ref 1.5–4.5)
Glucose: 97 mg/dL (ref 70–99)
Potassium: 4.1 mmol/L (ref 3.5–5.2)
Sodium: 137 mmol/L (ref 134–144)
Total Protein: 6.9 g/dL (ref 6.0–8.5)
eGFR: 46 mL/min/{1.73_m2} — ABNORMAL LOW

## 2023-10-27 LAB — MAGNESIUM: Magnesium: 1.9 mg/dL (ref 1.6–2.3)

## 2023-10-27 LAB — VITAMIN D 25 HYDROXY (VIT D DEFICIENCY, FRACTURES): Vit D, 25-Hydroxy: 54.4 ng/mL (ref 30.0–100.0)

## 2023-10-29 ENCOUNTER — Encounter: Payer: Self-pay | Admitting: Family Medicine

## 2023-11-05 ENCOUNTER — Encounter: Payer: Self-pay | Admitting: *Deleted

## 2023-11-08 ENCOUNTER — Ambulatory Visit: Payer: Self-pay | Admitting: Family Medicine

## 2023-11-08 NOTE — Telephone Encounter (Signed)
 Chief Complaint: diarrhea Symptoms: loose, yellow bowel movements Frequency: x couple of months, worsened a few days ago Pertinent Negatives: Patient denies vomiting, dizziness, dehydration, blood in stool or black tarry stool. Disposition: [] ED /[] Urgent Care (no appt availability in office) / [x] Appointment(In office/virtual)/ []  [place] [facility]/ [] Home Care/ [x] Refused Recommended Disposition /[] [facility]/ []  Follow-up with PCP Additional Notes: Patient complaining of diarrhea x couple of months and worsened a few nights ago. She states she was not able to make it to the toilet and soiled herself. She states she has had 3 episodes of yellow loose stools in the last 24 hours. She states she had a similar episode last year and was placed on prednisone and that cleared it up. Patient states she takes Turmeric and is not sure if that has been contributing to the yellow colored stool. Offered patient acute visits today and tomorrow. She states she would rather send a message to her PCP to see if she will write her a tapered dose of  prednisone. Advised patient that her PCP may not write the prescription and also advise an office visit, she is okay with waiting to hear back from PCP. Advised patient to call back for any new or worsening symptoms.  Summary: Ongoing Diarrhea, Rx request   Copied From CRM [phone]. Reason for Triage: Ongoing diarrhea and chills  Requesting prednisone  Best contact: [phone]         Reason for Disposition  [1] Mild diarrhea (e.g., 1-3 or more stools than normal in past 24 hours) without known cause AND [2] present >  7 days  Answer Assessment - Initial Assessment Questions 1. DIARRHEA SEVERITY: "How bad is the diarrhea?" "How many more stools have you had in the past 24 hours than normal?"    - NO DIARRHEA (SCALE 0)   - MILD (SCALE 1-3): Few loose or mushy BMs; increase of 1-3 stools over normal daily number of stools; mild increase in  ostomy output.   -  MODERATE (SCALE 4-7): Increase of 4-6 stools daily over normal; moderate increase in ostomy output.   -  SEVERE (SCALE 8-10; OR "WORST POSSIBLE"): Increase of 7 or more stools daily over normal; moderate increase in ostomy output; incontinence.     2 yesterday and 1 this morning.  2. ONSET: "When did the diarrhea begin?"      Couple of months. She states it comes in waves. Worsened "the other night" she states she was not able to make it to the bathroom.  3. BM CONSISTENCY: "How loose or watery is the diarrhea?"      "Real loose and yellow color like mustard"  4. VOMITING: "Are you also vomiting?" If Yes, ask: "How many times in the past 24 hours?"      Denies.  5. ABDOMEN PAIN: "Are you having any abdomen pain?" If Yes, ask: "What does it feel like?" (e.g., crampy, dull, intermittent, constant)      Denies.  6. ABDOMEN PAIN SEVERITY: If present, ask: "How bad is the pain?"  (e.g., Scale 1-10; mild, moderate, or severe)   - MILD (1-3): doesn't interfere with normal activities, abdomen soft and not tender to touch    - MODERATE (4-7): interferes with normal activities or awakens from sleep, abdomen tender to touch    - SEVERE (8-10): excruciating pain, doubled over, unable to do any normal activities       Denies.  7. ORAL INTAKE: If vomiting, "Have you been able to drink  liquids?" "How much liquids have you had in the past 24 hours?"     No vomiting.  8. HYDRATION: "Any signs of dehydration?" (e.g., dry mouth [not just dry lips], too weak to stand, dizziness, new weight loss) "When did you last urinate?"     Patient states she thinks she is eating and drinking enough. She states she is still making urine.  9. EXPOSURE: "Have you traveled to a foreign country recently?" "Have you been exposed to anyone with diarrhea?" "Could you have eaten any food that was spoiled?"     Denies.  10. ANTIBIOTIC USE: "Are you taking antibiotics now or have you taken antibiotics in  the past 2 months?"       Denies any antibiotic use, states she was prescribed some for a UTI and had not started taking them and was told it was not a UTI so she never took them.  11. OTHER SYMPTOMS: "Do you have any other symptoms?" (e.g., fever, blood in stool)       Chills  12. PREGNANCY: "Is there any chance you are pregnant?" "When was your last menstrual period?"       N/A.  Protocols used: [facility]

## 2023-11-09 ENCOUNTER — Encounter: Payer: Self-pay | Admitting: Family Medicine

## 2023-11-09 ENCOUNTER — Ambulatory Visit (INDEPENDENT_AMBULATORY_CARE_PROVIDER_SITE_OTHER): Admitting: Family Medicine

## 2023-11-09 VITALS — BP 109/61 | HR 90 | Temp 98.8°F | Ht 64.0 in | Wt 145.0 lb

## 2023-11-09 DIAGNOSIS — K52839 Microscopic colitis, unspecified: Secondary | ICD-10-CM

## 2023-11-09 MED ORDER — PREDNISONE 10 MG (21) PO TBPK: ORAL_TABLET | ORAL | 0 refills | Status: DC

## 2023-11-09 NOTE — Progress Notes (Signed)
 Subjective: CC: Colitis PCP: Raliegh Ip, DO Kimberly Suarez is a 83 y.o. female presenting to clinic today for:  1.  Microscopic colitis She reports that she has been having a flareup over the last several weeks.  We saw each other back in February and at that time she had had it for the 2 weeks preceding that visit.  She denies any blood in stool.  No abdominal pain, nausea or vomiting.  She is having at least a few episodes per day and has had some fecal incontinence.  Yesterday she really tried did not eat much because she was scared about having a fecal incontinent episode again.  She is on probiotics.  She used to be under the care of gastroenterology but has been lost to follow-up.  She notes that she was prescribed a steroid last year which seemed to improve symptoms.  Would like to repeat this today   ROS: Per HPI  No Known Allergies Past Medical History:  Diagnosis Date   ASCUS (atypical squamous cells of undetermined significance) on Pap smear 2011x2   Negative high-risk HPV, normal Pap smears 2012/2013   Atypical nevus 05/02/2007   right back-moderate    Cancer (HCC)    Cataract    Diverticulosis    GERD (gastroesophageal reflux disease)    Hiatal hernia    Hyperlipemia    Hypertension    IBS (irritable bowel syndrome)    Kidney disease    Lymphocytic colitis 2007   Neuropathy    SCC (squamous cell carcinoma) 01/19/2006   bridge of nose (CX35FU)   SCC (squamous cell carcinoma) 03/08/2017   right inner lower shin (CX35FU)   SCC (squamous cell carcinoma) x 2 02/24/2002   right bridge of nose (Cx35FU),right bridge of nose   Uterine prolapse    Pessary    Current Outpatient Medications:    acetaminophen (TYLENOL) 500 MG tablet, Take 2 tablets (1,000 mg total) by mouth 3 (three) times daily., Disp: 180 tablet, Rfl: PRN   Alpha-Lipoic Acid 600 MG CAPS, Take by mouth., Disp: , Rfl:    apixaban (ELIQUIS) 5 MG TABS tablet, Take 1 tablet (5 mg total) by  mouth 2 (two) times daily. For afib, Disp: 180 tablet, Rfl: 0   atorvastatin (LIPITOR) 40 MG tablet, TAKE 1 TABLET BY MOUTH DAILY, Disp: 100 tablet, Rfl: 0   enalapril (VASOTEC) 10 MG tablet, TAKE 1 TABLET BY MOUTH TWICE  DAILY, Disp: 200 tablet, Rfl: 0   estradiol (ESTRACE) 0.1 MG/GM vaginal cream, Place 0.5g nightly for two weeks then twice a week after, Disp: 30 g, Rfl: 11   famotidine (PEPCID) 20 MG tablet, Take 1 tablet (20 mg total) by mouth every other day. ** PLEASE NOTE THIS IS ADJUSTED FOR KIDNEY FUNCTION, Disp: 45 tablet, Rfl: 3   fluticasone (FLONASE) 50 MCG/ACT nasal spray, Place 1 spray into both nostrils 2 (two) times daily as needed for allergies or rhinitis., Disp: 16 g, Rfl: 6   gabapentin (NEURONTIN) 100 MG capsule, TAKE 3 CAPSULES BY MOUTH AT  BEDTIME, Disp: 300 capsule, Rfl: 0   Glucosamine-Chondroitin (GLUCOSAMINE CHONDR COMPLEX PO), Take 1 tablet by mouth 2 (two) times daily., Disp: , Rfl:    hydrochlorothiazide (HYDRODIURIL) 25 MG tablet, TAKE 1 TABLET BY MOUTH DAILY, Disp: 100 tablet, Rfl: 0   Melatonin 10 MG CAPS, Take by mouth at bedtime and may repeat dose one time if needed., Disp: , Rfl:    metoprolol succinate (TOPROL XL) 25 MG 24  hr tablet, Take 1 tablet (25 mg total) by mouth daily., Disp: 90 tablet, Rfl: 3   Multiple Vitamins-Minerals (MULTIVITAMIN ADULT PO), Take 1 tablet by mouth daily., Disp: , Rfl:    Omega-3 Fatty Acids (FISH OIL) 1000 MG CAPS, Take 1 capsule by mouth daily., Disp: , Rfl:    predniSONE (STERAPRED UNI-PAK 21 TAB) 10 MG (21) TBPK tablet, As directed x 6 days, Disp: 21 tablet, Rfl: 0   saccharomyces boulardii (FLORASTOR) 250 MG capsule, Take 250 mg by mouth 2 (two) times daily., Disp: , Rfl:    TURMERIC CURCUMIN PO, Take 1,500 mg by mouth 2 (two) times daily., Disp: , Rfl:    vitamin B-12 (CYANOCOBALAMIN) 1000 MCG tablet, Take 1,000 mcg by mouth daily., Disp: , Rfl:    vitamin B-12 (CYANOCOBALAMIN) 500 MCG tablet, Take 500 mcg by mouth daily.,  Disp: , Rfl:    VITAMIN D PO, Take by mouth., Disp: , Rfl:  Social History   Socioeconomic History   Marital status: Married    Spouse name: Not on file   Number of children: 3    Years of education: Not on file   Highest education level: 8th grade  Occupational History   Occupation: Retired   Tobacco Use   Smoking status: Never   Smokeless tobacco: Never  Vaping Use   Vaping status: Never Used  Substance and Sexual Activity   Alcohol use: Not Currently   Drug use: No   Sexual activity: Not Currently    Birth control/protection: Post-menopausal    Comment: 1st intercourse 15 yo--1 partner  Other Topics Concern   Not on file  Social History Narrative   Lives at home with husband.   Right-handed.   Caffeine use: one cup caffeine some days.   Social Drivers of Corporate investment banker Strain: Low Risk  (10/09/2018)   Overall Financial Resource Strain (CARDIA)    Difficulty of Paying Living Expenses: Not hard at all  Food Insecurity: No Food Insecurity (10/09/2018)   Hunger Vital Sign    Worried About Running Out of Food in the Last Year: Never true    Ran Out of Food in the Last Year: Never true  Transportation Needs: No Transportation Needs (10/09/2018)   PRAPARE - Administrator, Civil Service (Medical): No    Lack of Transportation (Non-Medical): No  Physical Activity: Insufficiently Active (10/09/2018)   Exercise Vital Sign    Days of Exercise per Week: 2 days    Minutes of Exercise per Session: 30 min  Stress: No Stress Concern Present (10/09/2018)   [employer] of Occupational Health - Occupational Stress Questionnaire    Feeling of Stress : Not at all  Social Connections: Moderately Integrated (10/09/2018)   Social Connection and Isolation Panel [NHANES]    Frequency of Communication with Friends and Family: More than three times a week    Frequency of Social Gatherings with Friends and Family: More than three times a week    Attends Religious  Services: More than 4 times per year    Active Member of [employer] or Organizations: No    Attends Banker Meetings: Never    Marital Status: Married  Catering manager Violence: Not At Risk (10/09/2018)   Humiliation, Afraid, Rape, and Kick questionnaire    Fear of Current or Ex-Partner: No    Emotionally Abused: No    Physically Abused: No    Sexually Abused: No   Family History  Problem Relation Age  of Onset   Heart disease Mother    Diabetes Brother        BKA   COPD Father    Asthma Father    Alzheimer's disease Sister    Alzheimer's disease Brother    Multiple sclerosis Sister    Heart disease Sister    Diabetes Brother    Early death Brother        died in war   Colon cancer Neg Hx    Kidney disease Neg Hx    Liver disease Neg Hx     Objective: Office vital signs reviewed. BP 109/61   Pulse 90   Temp 98.8 F (37.1 C)   Ht 5\' 4"  (1.626 m)   Wt 145 lb (65.8 kg)   SpO2 92%   BMI 24.89 kg/m   Physical Examination:  General: Awake, alert, well nourished, No acute distress HEENT:  sclera white, MMM GI: soft, non-tender, non-distended, bowel sounds present x4, no hepatomegaly, no splenomegaly, no masses    Assessment/ Plan: 83 y.o. female   Microscopic colitis, unspecified microscopic colitis type - Plan: Ambulatory referral to Gastroenterology, Cdiff NAA+O+P+Stool Culture, Fecal occult blood, imunochemical([facility]), predniSONE (STERAPRED UNI-PAK 21 TAB) 10 MG (21) TBPK tablet  Prednisone given.  Wonder if she may need to go on budesonide?  Will defer to gastroenterology, whom I referred her back to today.  Will collect stool studies to ensure that this is not an infectious issue as well as look for fecal occult blood   Larine Fielding Hulen Skains, DO [facility] [phone]

## 2023-11-09 NOTE — Telephone Encounter (Signed)
 Have her keep appt today.

## 2023-11-12 ENCOUNTER — Other Ambulatory Visit

## 2023-11-12 DIAGNOSIS — K52839 Microscopic colitis, unspecified: Secondary | ICD-10-CM | POA: Diagnosis not present

## 2023-11-13 LAB — FECAL OCCULT BLOOD, IMMUNOCHEMICAL: Fecal Occult Bld: POSITIVE — AB

## 2023-11-14 ENCOUNTER — Encounter: Payer: Self-pay | Admitting: Family Medicine

## 2023-11-14 ENCOUNTER — Telehealth: Payer: Self-pay | Admitting: Gastroenterology

## 2023-11-14 NOTE — Telephone Encounter (Signed)
 Patient called and stated that she has a referral for Microscopic colitis and patient is wanting to schedule only with Dr. Rhea Belton. I advise patient that his first available will be for June 12 th or June 16 th. Patient stated that the appointment was to far away for her and is wanting to speak to the nurse to see if she can get a sooner appointment with Dr. Rhea Belton. Patient is requesting a call back. Please advise.

## 2023-11-14 NOTE — Telephone Encounter (Signed)
 Pt states she needs to be seen, h/o microscopic colitis. Also mentioned her stool cards were positive for blood. Pt scheduled to see Boone Master PA 11/26/23 at 1:30pm. Pt aware of appt.

## 2023-11-14 NOTE — Telephone Encounter (Signed)
 This pt is a Dr Rhea Belton pt that is requesting to only be seen by Dr Rhea Belton. She did see Shanda Bumps in 2022 but should be sent to the correct POD going forward.

## 2023-11-16 ENCOUNTER — Telehealth: Payer: Self-pay

## 2023-11-16 LAB — CDIFF NAA+O+P+STOOL CULTURE
E coli, Shiga toxin Assay: NEGATIVE
Toxigenic C. Difficile by PCR: NEGATIVE

## 2023-11-16 NOTE — Telephone Encounter (Signed)
 Copied from CRM [id number]. Topic: Clinical - Lab/Test Results >> Nov 16, 2023  9:28 AM Ivette P wrote: Reason for CRM: PT called in to see about results.   Advised results as follow:   "Raliegh Ip, DO to [facility] Clinical Regarding results: 2     11/16/23  8:06 AM Result Note Negative stool studies so far"    Pt understood, no further questions

## 2023-11-23 DIAGNOSIS — M170 Bilateral primary osteoarthritis of knee: Secondary | ICD-10-CM | POA: Diagnosis not present

## 2023-11-26 ENCOUNTER — Ambulatory Visit: Admitting: Gastroenterology

## 2023-11-26 ENCOUNTER — Encounter: Payer: Self-pay | Admitting: Gastroenterology

## 2023-11-26 ENCOUNTER — Other Ambulatory Visit: Payer: Self-pay | Admitting: *Deleted

## 2023-11-26 ENCOUNTER — Other Ambulatory Visit (INDEPENDENT_AMBULATORY_CARE_PROVIDER_SITE_OTHER)

## 2023-11-26 VITALS — BP 142/80 | HR 60 | Ht 61.0 in | Wt 143.2 lb

## 2023-11-26 DIAGNOSIS — Z7901 Long term (current) use of anticoagulants: Secondary | ICD-10-CM

## 2023-11-26 DIAGNOSIS — K52839 Microscopic colitis, unspecified: Secondary | ICD-10-CM

## 2023-11-26 DIAGNOSIS — R195 Other fecal abnormalities: Secondary | ICD-10-CM

## 2023-11-26 DIAGNOSIS — R634 Abnormal weight loss: Secondary | ICD-10-CM | POA: Diagnosis not present

## 2023-11-26 DIAGNOSIS — I480 Paroxysmal atrial fibrillation: Secondary | ICD-10-CM

## 2023-11-26 DIAGNOSIS — I4891 Unspecified atrial fibrillation: Secondary | ICD-10-CM

## 2023-11-26 LAB — CBC WITH DIFFERENTIAL/PLATELET
Basophils Absolute: 0.1 10*3/uL (ref 0.0–0.1)
Basophils Relative: 0.8 % (ref 0.0–3.0)
Eosinophils Absolute: 0.1 10*3/uL (ref 0.0–0.7)
Eosinophils Relative: 0.8 % (ref 0.0–5.0)
HCT: 37.6 % (ref 36.0–46.0)
Hemoglobin: 12.5 g/dL (ref 12.0–15.0)
Lymphocytes Relative: 22.3 % (ref 12.0–46.0)
Lymphs Abs: 2.4 10*3/uL (ref 0.7–4.0)
MCHC: 33.3 g/dL (ref 30.0–36.0)
MCV: 91.9 fl (ref 78.0–100.0)
Monocytes Absolute: 1.2 10*3/uL — ABNORMAL HIGH (ref 0.1–1.0)
Monocytes Relative: 11 % (ref 3.0–12.0)
Neutro Abs: 7.1 10*3/uL (ref 1.4–7.7)
Neutrophils Relative %: 65.1 % (ref 43.0–77.0)
Platelets: 243 10*3/uL (ref 150.0–400.0)
RBC: 4.09 Mil/uL (ref 3.87–5.11)
RDW: 13.6 % (ref 11.5–15.5)
WBC: 10.9 10*3/uL — ABNORMAL HIGH (ref 4.0–10.5)

## 2023-11-26 MED ORDER — AMBULATORY NON FORMULARY MEDICATION: 0 refills | Status: DC

## 2023-11-26 NOTE — Progress Notes (Signed)
 Chief Complaint: diarrhea Primary GI MD: Dr. Rhea Belton  HPI: 83 year old female with history of microscopic colitis and others as listed below presents for diarrhea  Last seen 08/2020 by Doug Sou, PA-C History of microscopic colitis previously treated in 2018 and compounded budesonide to [facility] in [place], due to expense and responded well.  At last appointment she was having intermittent diarrhea.  Budesonide was sent in to the apothecary in [place] with a taper of 10 Mg daily, 7.5 Mg daily, 5 Mg daily, to 2.5 Mg daily and then discontinue.  She was also having white coating on her tongue that scrapes off and halitosis so she was given nystatin swish  Recently seen by PCP for annual exam Workup includes positive fecal occult.  Negative C. difficile and stool culture CMP shows stable CKD.  CBC with no anemia TSH normal  Patient presents today with her daughter.  She has noted having intermittent loose stools over the past 6 weeks.  She states it feels similar to when she typically has a microscopic colitis flareup with her last flareup being in 2022 although she does note some mild intermittent flareups in between that have not required medication.  Denies melena/hematochezia.  Denies abdominal pain.  Denies nausea/vomiting.  Recently got diagnosed with A-fib in November 2024 and was started on Eliquis   PREVIOUS GI WORKUP   Colonoscopy 2012 - melanosis - moderate diverticulosis in sigmoid/descending No polyps  EGD for dysphagia 2012 - hiatal hernia - otherwise normal  Past Medical History:  Diagnosis Date   ASCUS (atypical squamous cells of undetermined significance) on Pap smear 2011x2   Negative high-risk HPV, normal Pap smears 2012/2013   Atrial fibrillation (HCC)    Atypical nevus 05/02/2007   right back-moderate    Cancer (HCC)    Cataract    Diverticulosis    GERD (gastroesophageal reflux disease)    Hiatal hernia    Hyperlipemia     Hypertension    IBS (irritable bowel syndrome)    Kidney disease    Lymphocytic colitis 2007   Neuropathy    SCC (squamous cell carcinoma) 01/19/2006   bridge of nose (CX35FU)   SCC (squamous cell carcinoma) 03/08/2017   right inner lower shin (CX35FU)   SCC (squamous cell carcinoma) x 2 02/24/2002   right bridge of nose (Cx35FU),right bridge of nose   Uterine prolapse    Pessary    Past Surgical History:  Procedure Laterality Date   BREAST BIOPSY     unsure which breast   CATARACT EXTRACTION W/ INTRAOCULAR LENS IMPLANT Bilateral     Current Outpatient Medications  Medication Sig Dispense Refill   acetaminophen (TYLENOL) 500 MG tablet Take 2 tablets (1,000 mg total) by mouth 3 (three) times daily. 180 tablet PRN   Alpha-Lipoic Acid 600 MG CAPS Take by mouth.     apixaban (ELIQUIS) 5 MG TABS tablet Take 1 tablet (5 mg total) by mouth 2 (two) times daily. For afib 180 tablet 0   atorvastatin (LIPITOR) 40 MG tablet TAKE 1 TABLET BY MOUTH DAILY 100 tablet 0   enalapril (VASOTEC) 10 MG tablet TAKE 1 TABLET BY MOUTH TWICE  DAILY 200 tablet 0   estradiol (ESTRACE) 0.1 MG/GM vaginal cream Place 0.5g nightly for two weeks then twice a week after 30 g 11   famotidine (PEPCID) 20 MG tablet Take 1 tablet (20 mg total) by mouth every other day. ** PLEASE NOTE THIS IS ADJUSTED FOR KIDNEY FUNCTION 45 tablet 3  fluticasone (FLONASE) 50 MCG/ACT nasal spray Place 1 spray into both nostrils 2 (two) times daily as needed for allergies or rhinitis. 16 g 6   gabapentin (NEURONTIN) 100 MG capsule TAKE 3 CAPSULES BY MOUTH AT  BEDTIME 300 capsule 0   Glucosamine-Chondroitin (GLUCOSAMINE CHONDR COMPLEX PO) Take 1 tablet by mouth 2 (two) times daily.     hydrochlorothiazide (HYDRODIURIL) 25 MG tablet TAKE 1 TABLET BY MOUTH DAILY (Patient taking differently: as needed.) 100 tablet 0   Melatonin 10 MG CAPS Take by mouth at bedtime and may repeat dose one time if needed.     metoprolol succinate (TOPROL XL)  25 MG 24 hr tablet Take 1 tablet (25 mg total) by mouth daily. 90 tablet 3   Multiple Vitamins-Minerals (MULTIVITAMIN ADULT PO) Take 1 tablet by mouth daily.     Omega-3 Fatty Acids (FISH OIL) 1000 MG CAPS Take 1 capsule by mouth daily.     saccharomyces boulardii (FLORASTOR) 250 MG capsule Take 250 mg by mouth 2 (two) times daily.     TURMERIC CURCUMIN PO Take 1,500 mg by mouth 2 (two) times daily.     vitamin B-12 (CYANOCOBALAMIN) 1000 MCG tablet Take 1,000 mcg by mouth daily.     vitamin B-12 (CYANOCOBALAMIN) 500 MCG tablet Take 500 mcg by mouth daily.     VITAMIN D PO Take by mouth.     AMBULATORY NON FORMULARY MEDICATION Budesonide 5mg    10 Mg daily x 4 weeks, 7.5 Mg daily x 4 weeks, 5 Mg daily x 4 weeks, to 2.5 Mg daily x 4 weeks and then discontinue 140 tablet 0   predniSONE (STERAPRED UNI-PAK 21 TAB) 10 MG (21) TBPK tablet As directed x 6 days (Patient not taking: Reported on 11/26/2023) 21 tablet 0   No current facility-administered medications for this visit.    Allergies as of 11/26/2023   (No Known Allergies)    Family History  Problem Relation Age of Onset   Heart disease Mother    Diabetes Brother        BKA   COPD Father    Asthma Father    Alzheimer's disease Sister    Alzheimer's disease Brother    Multiple sclerosis Sister    Heart disease Sister    Diabetes Brother    Early death Brother        died in war   Colon cancer Neg Hx    Kidney disease Neg Hx    Liver disease Neg Hx     Social History   Socioeconomic History   Marital status: Married    Spouse name: Not on file   Number of children: 3    Years of education: Not on file   Highest education level: 8th grade  Occupational History   Occupation: Retired   Tobacco Use   Smoking status: Never   Smokeless tobacco: Never  Vaping Use   Vaping status: Never Used  Substance and Sexual Activity   Alcohol use: Not Currently   Drug use: No   Sexual activity: Not Currently    Birth  control/protection: Post-menopausal    Comment: 1st intercourse 15 yo--1 partner  Other Topics Concern   Not on file  Social History Narrative   Lives at home with husband.   Right-handed.   Caffeine use: one cup caffeine some days.   Social Drivers of Health   Financial Resource Strain: Low Risk  (10/09/2018)   Overall Financial Resource Strain (CARDIA)    Difficulty of  Paying Living Expenses: Not hard at all  Food Insecurity: No Food Insecurity (10/09/2018)   Hunger Vital Sign    Worried About Running Out of Food in the Last Year: Never true    Ran Out of Food in the Last Year: Never true  Transportation Needs: No Transportation Needs (10/09/2018)   PRAPARE - Administrator, Civil Service (Medical): No    Lack of Transportation (Non-Medical): No  Physical Activity: Insufficiently Active (10/09/2018)   Exercise Vital Sign    Days of Exercise per Week: 2 days    Minutes of Exercise per Session: 30 min  Stress: No Stress Concern Present (10/09/2018)   [employer] of Occupational Health - Occupational Stress Questionnaire    Feeling of Stress : Not at all  Social Connections: Moderately Integrated (10/09/2018)   Social Connection and Isolation Panel [NHANES]    Frequency of Communication with Friends and Family: More than three times a week    Frequency of Social Gatherings with Friends and Family: More than three times a week    Attends Religious Services: More than 4 times per year    Active Member of [employer] or Organizations: No    Attends Banker Meetings: Never    Marital Status: Married  Catering manager Violence: Not At Risk (10/09/2018)   Humiliation, Afraid, Rape, and Kick questionnaire    Fear of Current or Ex-Partner: No    Emotionally Abused: No    Physically Abused: No    Sexually Abused: No    Review of Systems:    Constitutional: No weight loss, fever, chills, weakness or fatigue HEENT: Eyes: No change in vision               Ears,  Nose, Throat:  No change in hearing or congestion Skin: No rash or itching Cardiovascular: No chest pain, chest pressure or palpitations   Respiratory: No SOB or cough Gastrointestinal: See HPI and otherwise negative Genitourinary: No dysuria or change in urinary frequency Neurological: No headache, dizziness or syncope Musculoskeletal: No new muscle or joint pain Hematologic: No bleeding or bruising Psychiatric: No history of depression or anxiety    Physical Exam:  Vital signs: BP (!) 142/80 (BP Location: Left Arm, Patient Position: Sitting, Cuff Size: Normal)   Pulse 60   Ht 5\' 1"  (1.549 m) Comment: height measured without shoes  Wt 143 lb 4 oz (65 kg)   BMI 27.07 kg/m   Constitutional: NAD, Well developed, Well nourished, alert and cooperative Head:  Normocephalic and atraumatic. Eyes:   PEERL, EOMI. No icterus. Conjunctiva pink. Respiratory: Respirations even and unlabored. Lungs clear to auscultation bilaterally.   No wheezes, crackles, or rhonchi.  Cardiovascular:  Regular rate and rhythm. No peripheral edema, cyanosis or pallor.  Gastrointestinal:  Soft, nondistended, nontender. No rebound or guarding. Normal bowel sounds. No appreciable masses or hepatomegaly. Rectal:  Not performed.  Msk:  Symmetrical without gross deformities. Without edema, no deformity or joint abnormality.  Neurologic:  Alert and  oriented x4;  grossly normal neurologically.  Skin:   Dry and intact without significant lesions or rashes. Psychiatric: Oriented to person, place and time. Demonstrates good judgement and reason without abnormal affect or behaviors.    RELEVANT LABS AND IMAGING: CBC    Component Value Date/Time   WBC 6.8 07/12/2023 1646   WBC 5.0 05/18/2014 0835   RBC 4.64 07/12/2023 1646   RBC 4.4 05/18/2014 0835   HGB 14.1 07/12/2023 1646   HCT 42.7 07/12/2023  1646   PLT 231 07/12/2023 1646   MCV 92 07/12/2023 1646   MCH 30.4 07/12/2023 1646   MCH 29.0 05/18/2014 0835   MCHC  33.0 07/12/2023 1646   MCHC 32.2 05/18/2014 0835   RDW 13.7 07/12/2023 1646   LYMPHSABS 2.0 07/12/2023 1646   EOSABS 0.1 07/12/2023 1646   BASOSABS 0.0 07/12/2023 1646    CMP     Component Value Date/Time   NA 137 10/26/2023 1106   K 4.1 10/26/2023 1106   CL 97 10/26/2023 1106   CO2 25 10/26/2023 1106   GLUCOSE 97 10/26/2023 1106   GLUCOSE 99 12/02/2012 0857   BUN 14 10/26/2023 1106   CREATININE 1.19 (H) 10/26/2023 1106   CREATININE 1.28 (H) 12/02/2012 0857   CALCIUM 9.9 10/26/2023 1106   PROT 6.9 10/26/2023 1106   ALBUMIN 4.3 10/26/2023 1106   AST 24 10/26/2023 1106   ALT 17 10/26/2023 1106   ALKPHOS 49 10/26/2023 1106   BILITOT 0.5 10/26/2023 1106   GFRNONAA 34 (L) 06/25/2020 0845   GFRAA 39 (L) 06/25/2020 0845     Assessment/Plan:      Microscopic colitis   Experiencing a flare-up of microscopic colitis with six weeks of intermittent diarrhea, requiring a pad on some days. Feels similar to previous episodes. Stool culture negative, hemocult positive.  Repeat colonoscopy was discussed at last visit in which she deferred.  She defers repeat colonoscopy at this time as well. - Budesonide sent in to the apothecary in [place] with a taper of 10 Mg daily, 7.5 Mg daily, 5 Mg daily, to 2.5 Mg daily and then discontinue  Heme positive stool Positive Hemoccult with PCP.  She is on Eliquis for history of A-fib.  Extensive discussion with patient and daughter about proceeding with colonoscopy for further evaluation of positive Hemoccult (although she does not have overt bleeding).  Last CBC November 2024 without anemia.  Patient is not interested in repeat colonoscopy.  Though she is agreeable if she starts having bleeding or anemia. - CBC to check for anemia - Colonoscopy if overt bleeding or anemia - Patient appears younger than stated age and is in adequate health to undergo colonoscopy.  Patient declined colonoscopy today.  If she changes her mind she can call us  back  A-fib Newly diagnosed November 2024 on Eliquis  Weight loss 20 pound weight loss over the past 2 years since her husband has passed due to being home alone often and not cooking as much - Continue to monitor - If continued weight loss would once again recommend colonoscopy  [facility] 11/26/2023, 2:07 PM  Cc: Raliegh Ip, DO

## 2023-11-26 NOTE — Patient Instructions (Addendum)
 We have sent the following medications to your pharmacy for you to pick up at your convenience:  Budesonide 5mg  : 10 Mg daily x 4 weeks, 7.5 Mg daily x 4 weeks, 5 Mg daily x 4 weeks, to 2.5 Mg daily x 4 weeks and then discontinue.  _______________________________________________________  If your blood pressure at your visit was 140/90 or greater, please contact your primary care physician to follow up on this.  _______________________________________________________  If you are age 45 or older, your body mass index should be between 23-30. Your Body mass index is 27.07 kg/m. If this is out of the aforementioned range listed, please consider follow up with your Primary Care Provider.  If you are age 75 or younger, your body mass index should be between 19-25. Your Body mass index is 27.07 kg/m. If this is out of the aformentioned range listed, please consider follow up with your Primary Care Provider.   ________________________________________________________  The [facility] providers would like to encourage you to use [facility] to communicate with providers for non-urgent requests or questions.  Due to long hold times on the telephone, sending your provider a message by [facility] may be a faster and more efficient way to get a response.  Please allow 48 business hours for a response.  Please remember that this is for non-urgent requests.  _______________________________________________________

## 2023-11-27 ENCOUNTER — Telehealth: Payer: Self-pay | Admitting: Gastroenterology

## 2023-11-27 ENCOUNTER — Telehealth: Payer: Self-pay

## 2023-11-27 NOTE — Telephone Encounter (Signed)
 Called [facility] to confirm how to administer budesonide taper. I asked to speak with pharmacist Kimberly Suarez and was left on hold for 11 mins. 11/27/23

## 2023-11-27 NOTE — Telephone Encounter (Signed)
 Marylene Land, pharmacist with [facility] is calling to confirm the order for Budesonide. They need to know exactly how the prescription should be taken. Please call them at [phone]

## 2023-12-04 ENCOUNTER — Telehealth: Payer: Self-pay

## 2023-12-04 MED ORDER — BUDESONIDE 3 MG PO CPEP: ORAL_CAPSULE | ORAL | 2 refills | Status: DC

## 2023-12-04 NOTE — Telephone Encounter (Signed)
 Clearance request sent to hold Eliquis for 2 days prior to colon.

## 2023-12-04 NOTE — Telephone Encounter (Signed)
 Spoke with pt and she is aware. Script sent to pharmacy and goodrx coupon mailed to pt and info entered in script.  Pt states she did see some BRB in her stool today and thinks she wants to proceed wth a colonoscopy now. Please advise.

## 2023-12-04 NOTE — Telephone Encounter (Signed)
   Name: Kimberly Suarez  DOB: 12/29/1940  MRN: [id number]  Primary Cardiologist: Rollene Rotunda, MD  Chart reviewed as part of pre-operative protocol coverage. The patient has an upcoming visit scheduled with Dr. Antoine Poche on 12/05/2023 at which time clearance can be addressed in case there are any issues that would impact surgical recommendations.  Colonoscopy is not scheduled until TBD as below. I added preop FYI to appointment note so that provider is aware to address at time of outpatient visit.  Per office protocol the cardiology provider should forward their finalized clearance decision and recommendations regarding antiplatelet therapy to the requesting party below.    This message will also be routed to pharmacy pool for input on holding Eliquis as requested below so that this information is available to the clearing provider at time of patient's appointment.   I will route this message as FYI to requesting party and remove this message from the preop box as separate preop APP input not needed at this time.   Please call with any questions.  Denyce Robert, NP  12/04/2023, 2:01 PM

## 2023-12-04 NOTE — Progress Notes (Signed)
  Cardiology Office Note:   Date:  12/05/2023  ID:  Kimberly, Suarez August 27, 1941, MRN [id number] PCP: Raliegh Ip, DO  [facility] Providers Cardiologist:  Rollene Rotunda, MD {  History of Present Illness:   Kimberly Suarez is a 83 y.o. female who saw Dr. Servando Salina for PAF.  She has CKD 3.  She has HTN.  She wore a monitor in 2024 and had PAF 2%.  She had symptoms associated with the atrial fib.      She said taking the beta-blocker she has had much improvement.  She has not had any of the spells in greater than a month.  If they do happen she feels her heart racing.  She has to sit up.  She has to take deep breaths.  She is not having any presyncope or syncope.  She is not having any chest pressure, neck or arm discomfort.  She has had no weight gain or edema.  She has noticed a little bleeding in her bowel movements and is having a colonoscopy apparently soon.  ROS: As stated in the HPI and negative for all other systems.  Studies Reviewed:    EKG:   NA  Risk Assessment/Calculations:    CHA2DS2-VASc Score = 4   This indicates a 4.8% annual risk of stroke. The patient's score is based upon: CHF History: 0 HTN History: 1 Diabetes History: 0 Stroke History: 0 Vascular Disease History: 0 Age Score: 2 Gender Score: 1    Physical Exam:   VS:  BP (!) 90/58   Pulse 72   Ht 5\' 1"  (1.549 m)   Wt 140 lb (63.5 kg)   BMI 26.45 kg/m    Wt Readings from Last 3 Encounters:  12/05/23 140 lb (63.5 kg)  11/26/23 143 lb 4 oz (65 kg)  11/09/23 145 lb (65.8 kg)     GEN: Well nourished, well developed in no acute distress NECK: No JVD; left carotid bruits CARDIAC: RRR, no murmurs, rubs, gallops RESPIRATORY:  Clear to auscultation without rales, wheezing or rhonchi  ABDOMEN: Soft, non-tender, non-distended EXTREMITIES:  No edema; No deformity   ASSESSMENT AND PLAN:   PAF: She tolerates anticoagulation.  She has few paroxysms.  No change in therapy is indicated.  She can  hold her anticoagulation for colonoscopy and we can decide if she is continues to be an anticoagulation candidate.  If not I will talk to her about Watchman.   Anticoagulation: She tolerates this as above.  Bruit: I will check carotid Doppler.    Follow up with me in 1 year  Signed, Rollene Rotunda, MD

## 2023-12-04 NOTE — Telephone Encounter (Signed)
 [facility] Pre-operative Risk Assessment     Request for surgical clearance:     Endoscopy Procedure  What type of surgery is being performed?     colonoscopy  When is this surgery scheduled?     Not scheduled yet-colonoscopy  What type of clearance is required ?   Pharmacy  Are there any medications that need to be held prior to surgery and how long? Eliquis 2 days  Practice name and name of physician performing surgery?    Dr. [facility]  What is your office phone and fax number?      Phone- [phone]  Fax- [phone]  Anesthesia type (None, local, MAC, general) ?       MAC   Please route your response to Selinda Michaels RN

## 2023-12-04 NOTE — Telephone Encounter (Signed)
 Patient called and stated that her budesonide taper was costing 422 and can not afford to pay for it. Patient stated that is having middle lower abdominal pain. Patient also stated that she has prednisone that she was prescribed but is not sure if she take it. Patient mentioned she had a knee injection a week ago and was not sure if it was ok to take because of her knee injection. Patient is requesting a speak to the nurse please advise.

## 2023-12-04 NOTE — Telephone Encounter (Signed)
 Pt states she cannot afford the budesonide, it is $422. She is wanting to know if there is something else that she could be prescribed. Also states since Sunday she has been having pain/pressure in her lower stomach. She is not sure what has caused this. She did get a steroid injection in her knee recently. Please advise.

## 2023-12-05 ENCOUNTER — Ambulatory Visit: Payer: Medicare Other | Admitting: Cardiology

## 2023-12-05 ENCOUNTER — Other Ambulatory Visit: Payer: Self-pay | Admitting: Family Medicine

## 2023-12-05 ENCOUNTER — Telehealth: Payer: Self-pay | Admitting: Gastroenterology

## 2023-12-05 ENCOUNTER — Encounter: Payer: Self-pay | Admitting: Cardiology

## 2023-12-05 VITALS — BP 90/58 | HR 72 | Ht 61.0 in | Wt 140.0 lb

## 2023-12-05 DIAGNOSIS — R197 Diarrhea, unspecified: Secondary | ICD-10-CM

## 2023-12-05 DIAGNOSIS — R195 Other fecal abnormalities: Secondary | ICD-10-CM

## 2023-12-05 DIAGNOSIS — D6869 Other thrombophilia: Secondary | ICD-10-CM | POA: Diagnosis not present

## 2023-12-05 DIAGNOSIS — I480 Paroxysmal atrial fibrillation: Secondary | ICD-10-CM | POA: Diagnosis not present

## 2023-12-05 DIAGNOSIS — R0989 Other specified symptoms and signs involving the circulatory and respiratory systems: Secondary | ICD-10-CM

## 2023-12-05 DIAGNOSIS — K625 Hemorrhage of anus and rectum: Secondary | ICD-10-CM

## 2023-12-05 MED ORDER — NA SULFATE-K SULFATE-MG SULF 17.5-3.13-1.6 GM/177ML PO SOLN: ORAL | 0 refills | Status: DC

## 2023-12-05 NOTE — Telephone Encounter (Signed)
 Patient has been advised that per cardiology, she may hold eliquis 2 days prior to her upcoming colonoscopy procedure (scheduled for 01/08/24). She verbalizes understanding of this. Written instructions have been made available to patient through mychart as well.

## 2023-12-05 NOTE — Telephone Encounter (Signed)
 Patient with diagnosis of afib on Eliquis for anticoagulation.    Procedure: colonoscopy Date of procedure: TBD   CHA2DS2-VASc Score = 4   This indicates a 4.8% annual risk of stroke. The patient's score is based upon: CHF History: 0 HTN History: 1 Diabetes History: 0 Stroke History: 0 Vascular Disease History: 0 Age Score: 2 Gender Score: 1      CrCl 30 ml/min Platelet count 243  Per office protocol, patient can hold Eliquis for 2 days prior to procedure.    **This guidance is not considered finalized until pre-operative APP has relayed final recommendations.**

## 2023-12-05 NOTE — Patient Instructions (Signed)
 Medication Instructions:  The current medical regimen is effective;  continue present plan and medications.  *If you need a refill on your cardiac medications before your next appointment, please call your pharmacy*  Testing/Procedures: Your physician has requested that you have a carotid duplex. This test is an ultrasound of the carotid arteries in your neck. It looks at blood flow through these arteries that supply the brain with blood. Allow one hour for this exam. There are no restrictions or special instructions. You will be contacted to be scheduled for this testing to be completed at the [facility] office.  Follow-Up: At [facility], you and your health needs are our priority.  As part of our continuing mission to provide you with exceptional heart care, our providers are all part of one team.  This team includes your primary Cardiologist (physician) and Advanced Practice Providers or APPs (Physician Assistants and Nurse Practitioners) who all work together to provide you with the care you need, when you need it.  Your next appointment:   1 year(s)  Provider:   Rollene Rotunda, MD    We recommend signing up for the patient portal called "MyChart".  Sign up information is provided on this After Visit Summary.  MyChart is used to connect with patients for Virtual Visits (Telemedicine).  Patients are able to view lab/test results, encounter notes, upcoming appointments, etc.  Non-urgent messages can be sent to your provider as well.   To learn more about what you can do with MyChart, go to [email or web address].     1st Floor: - Lobby - Registration  - Pharmacy  - Lab - Cafe  2nd Floor: - PV Lab - Diagnostic Testing (echo, CT, nuclear med)  3rd Floor: - Vacant  4th Floor: - TCTS (cardiothoracic surgery) - AFib Clinic - Structural Heart Clinic - Vascular Surgery  - Vascular Ultrasound  5th Floor: - HeartCare Cardiology (general and EP) - Clinical Pharmacy  for coumadin, hypertension, lipid, weight-loss medications, and med management appointments    Valet parking services will be available as well.

## 2023-12-05 NOTE — Telephone Encounter (Signed)
   Patient Name: Kimberly Suarez  DOB: [birth year] MRN: [id number]  Primary Cardiologist: Rollene Rotunda, MD  Chart reviewed as part of pre-operative protocol coverage. Given past medical history and time since last visit, based on ACC/AHA guidelines, Kyandra W Cena is at acceptable risk for the planned procedure without further cardiovascular testing.   Seen in office by Dr. Antoine Poche on 12/05/2023 and cleared for colonoscopy from cardiac perspective.   Per office protocol, patient can hold Eliquis for 2 days prior to procedure.    I will route this recommendation to the requesting party via Epic fax function and remove from pre-op pool.  Please call with questions.  Denyce Robert, NP 12/05/2023, 1:01 PM

## 2023-12-05 NOTE — Telephone Encounter (Signed)
 See 11/27/23 Gastro telephone note, 12/05/23 gastro telephone note. I have spoken to patient who states she is worried as she has started seeing small amounts of blood in her stool. She has been cleared to move forward with colonoscopy and hold eliquis 2 days prior to her test. Therefore, she has scheduled colonoscopy with Dr Rhea Belton on 01/08/24 at 3 pm. Patient has been advised of time/date/location for upcoming procedure and has been given generalized verbal prep instructions. Discussed that a care partner 18 years or older should bring her, stay for the procedure and drive home due to sedation. Written instructions have been made available to the patient for additional review via mychart. Patient was advised that she should hold eliquis 2 days prior to her upcoming procedure and she verbalizes understanding of all information.  She is advised that should she develop large amounts of rectal bleeding, large clots, severe abdominal pain before procedure, she should seek immediate medical care.

## 2023-12-05 NOTE — Telephone Encounter (Signed)
 Inbound call from patient stating she started seeing blood in her stool. Requesting a call back to discuss further. Please advise, thank you.

## 2023-12-06 NOTE — Progress Notes (Signed)
 Addendum: Reviewed and agree with assessment and management plan. If ongoing weight loss or diarrhea no response to budesonide then I would have a low threshold for cross-sectional imaging of the abdomen pelvis with CT plus contrast Abbegayle Denault, Carie Caddy, MD

## 2023-12-20 ENCOUNTER — Encounter: Payer: Self-pay | Admitting: *Deleted

## 2023-12-21 NOTE — Telephone Encounter (Signed)
Handled by nurse  °

## 2023-12-25 ENCOUNTER — Telehealth: Payer: Self-pay | Admitting: Gastroenterology

## 2023-12-25 NOTE — Telephone Encounter (Signed)
 Patient called and stated that she would like to know weather she can come in on the week of the 8 th to get her blood work done since she is not going to be in [place] this week. Patient is requesting a call back. Please advise.

## 2023-12-25 NOTE — Telephone Encounter (Signed)
 Patient advised that she may have labwork on 01/03/24 when she will be in town.

## 2023-12-27 ENCOUNTER — Telehealth: Payer: Self-pay

## 2023-12-27 ENCOUNTER — Ambulatory Visit (INDEPENDENT_AMBULATORY_CARE_PROVIDER_SITE_OTHER): Admitting: Family Medicine

## 2023-12-27 VITALS — BP 120/61 | HR 59 | Temp 98.2°F | Ht 61.0 in | Wt 142.0 lb

## 2023-12-27 DIAGNOSIS — N3090 Cystitis, unspecified without hematuria: Secondary | ICD-10-CM | POA: Diagnosis not present

## 2023-12-27 DIAGNOSIS — R350 Frequency of micturition: Secondary | ICD-10-CM | POA: Diagnosis not present

## 2023-12-27 LAB — URINALYSIS, ROUTINE W REFLEX MICROSCOPIC
Bilirubin, UA: NEGATIVE
Glucose, UA: NEGATIVE
Ketones, UA: NEGATIVE
Nitrite, UA: POSITIVE — AB
Protein,UA: NEGATIVE
RBC, UA: NEGATIVE
Specific Gravity, UA: 1.01 (ref 1.005–1.030)
Urobilinogen, Ur: 0.2 mg/dL (ref 0.2–1.0)
pH, UA: 7 (ref 5.0–7.5)

## 2023-12-27 LAB — MICROSCOPIC EXAMINATION
Epithelial Cells (non renal): NONE SEEN /HPF (ref 0–10)
RBC, Urine: NONE SEEN /HPF (ref 0–2)
Renal Epithel, UA: NONE SEEN /HPF
Yeast, UA: NONE SEEN

## 2023-12-27 MED ORDER — FLUCONAZOLE 100 MG PO TABS: ORAL_TABLET | ORAL | 0 refills | Status: DC

## 2023-12-27 MED ORDER — SULFAMETHOXAZOLE-TRIMETHOPRIM 800-160 MG PO TABS: 1.0000 | ORAL_TABLET | Freq: Two times a day (BID) | ORAL | 0 refills | Status: DC

## 2023-12-27 NOTE — Telephone Encounter (Signed)
 Scheduled pt to see DOD today. Pt aware.

## 2023-12-27 NOTE — Progress Notes (Unsigned)
 Subjective:  Patient ID: Kimberly Suarez, female    DOB: Jan 14, 1941  Age: 83 y.o. MRN: [id number]  CC: Urinary Tract Infection (Peed 5 times last night and there is stuff is stuff in it. )   HPI Kimberly Suarez presents for frequent urination last night. Noted mucoid DC in the urine. burning with urination and frequency. Denies fever . No flank pain. No nausea, vomiting.      11/09/2023   11:29 AM 10/26/2023   10:34 AM 10/26/2023   10:23 AM  Depression screen PHQ 2/9  Decreased Interest 0 0 0  Down, Depressed, Hopeless 0 0 0  PHQ - 2 Score 0 0 0  Altered sleeping 1 2 0  Tired, decreased energy 1 2 0  Change in appetite 0 0 0  Feeling bad or failure about yourself  0 0 0  Trouble concentrating 0 0 0  Moving slowly or fidgety/restless 0 0 0  Suicidal thoughts 0 0 0  PHQ-9 Score 2 4 0  Difficult doing work/chores Somewhat difficult Somewhat difficult     History Kimberly Suarez has a past medical history of ASCUS (atypical squamous cells of undetermined significance) on Pap smear (2011x2), Atrial fibrillation (HCC), Atypical nevus (05/02/2007), Cancer (HCC), Cataract, Diverticulosis, GERD (gastroesophageal reflux disease), Hiatal hernia, Hyperlipemia, Hypertension, IBS (irritable bowel syndrome), Kidney disease, Lymphocytic colitis (2007), Neuropathy, SCC (squamous cell carcinoma) (01/19/2006), SCC (squamous cell carcinoma) (03/08/2017), SCC (squamous cell carcinoma) x 2 (02/24/2002), and Uterine prolapse.   Kimberly Suarez has a past surgical history that includes Cataract extraction w/ intraocular lens implant (Bilateral) and Breast biopsy.   Her family history includes Alzheimer's disease in her brother and sister; Asthma in her father; COPD in her father; Diabetes in her brother and brother; Early death in her brother; Heart disease in her mother and sister; Multiple sclerosis in her sister.Kimberly Suarez reports that Kimberly Suarez has never smoked. Kimberly Suarez has never used smokeless tobacco. Kimberly Suarez reports that Kimberly Suarez does not  currently use alcohol. Kimberly Suarez reports that Kimberly Suarez does not use drugs.    ROS Review of Systems  Constitutional:  Negative for chills, diaphoresis and fever.  HENT:  Negative for congestion.   Eyes:  Negative for visual disturbance.  Respiratory:  Negative for cough and shortness of breath.   Cardiovascular:  Negative for chest pain and palpitations.  Gastrointestinal:  Negative for constipation, diarrhea and nausea.  Genitourinary:  Positive for dysuria, frequency and urgency. Negative for decreased urine volume, flank pain, hematuria, menstrual problem and pelvic pain.  Musculoskeletal:  Negative for arthralgias and joint swelling.  Skin:  Negative for rash.  Neurological:  Negative for dizziness and numbness.    Objective:  BP 120/61   Pulse (!) 59   Temp 98.2 F (36.8 C)   Ht 5\' 1"  (1.549 m)   Wt 142 lb (64.4 kg)   SpO2 95%   BMI 26.83 kg/m   BP Readings from Last 3 Encounters:  12/27/23 120/61  12/05/23 (!) 90/58  11/26/23 (!) 142/80    Wt Readings from Last 3 Encounters:  12/27/23 142 lb (64.4 kg)  12/05/23 140 lb (63.5 kg)  11/26/23 143 lb 4 oz (65 kg)     Physical Exam Constitutional:      Appearance: Kimberly Suarez is well-developed.  HENT:     Head: Normocephalic and atraumatic.  Cardiovascular:     Rate and Rhythm: Normal rate and regular rhythm.     Heart sounds: No murmur heard. Pulmonary:     Effort: Pulmonary effort is normal.  Breath sounds: Normal breath sounds.  Abdominal:     General: Bowel sounds are normal.     Palpations: Abdomen is soft. There is no mass.     Tenderness: There is no abdominal tenderness. There is no guarding or rebound.  Musculoskeletal:        General: No tenderness.  Skin:    General: Skin is warm and dry.  Neurological:     Mental Status: Kimberly Suarez is alert and oriented to person, place, and time.  Psychiatric:        Behavior: Behavior normal.      Assessment & Plan:  Cystitis -     Fluconazole ; Take two with first dose.  Then starting the next day take one daily until all are taken.  Dispense: 15 tablet; Refill: 0 -     Sulfamethoxazole -Trimethoprim ; Take 1 tablet by mouth 2 (two) times daily.  Dispense: 14 tablet; Refill: 0  Frequent urination -     Urinalysis, Routine w reflex microscopic -     Urine Culture -     Microscopic Examination     Follow-up: No follow-ups on file.  Roise Cleaver, M.D.

## 2023-12-27 NOTE — Telephone Encounter (Signed)
 Copied from CRM [id number]. Topic: Clinical - Medical Advice >> Dec 27, 2023  9:54 AM Shelby Dessert H wrote: Reason for CRM: Patient would like a call back because she is noticing some stuff floating in her urine, she did use the restroom over 5 times last night, patients callback number is [phone]. Patient did keep some of the urine and wants to know can she bring it in for testing and to show what's going on?

## 2023-12-28 LAB — URINE CULTURE

## 2023-12-30 ENCOUNTER — Encounter: Payer: Self-pay | Admitting: Family Medicine

## 2023-12-31 ENCOUNTER — Ambulatory Visit: Payer: Self-pay | Admitting: *Deleted

## 2023-12-31 NOTE — Telephone Encounter (Signed)
 Pt made aware and understood. Aware to call back if symptoms to be improved.

## 2023-12-31 NOTE — Telephone Encounter (Signed)
 Please have the patient take the Diflucan  every other day instead of every day and that should be fine with her kidney function.

## 2023-12-31 NOTE — Telephone Encounter (Signed)
  Chief Complaint: medication question regarding diflucan  100 mg and kidney function. Should patient take diflucan  ?  Symptoms: currently treated sx with Bactrim  DS prescribed by Dr. Veleta Gerold. Patient pharmacy requested patient to call PCP to ask if she still recommended taking diflucan  since last labs show abnormal kidney results.  Frequency: 12/27/23 Pertinent Negatives: Patient denies na Disposition: [] ED /[] Urgent Care (no appt availability in office) / [] Appointment(In office/virtual)/ []  [place] [facility]/ [] Home Care/ [] Refused Recommended Disposition /[] [place] [facility]/ [x]  Follow-up with PCP Additional Notes:   Patient requesting Dr. Bonnell Butcher to call back if it is safe for her to take recommended diflucan  prescribed on 5/1/ 25. Patient requesting a call back. Patient has not taken medication.       Reason for Disposition  [1] Caller has medicine question about med NOT prescribed by PCP AND [2] triager unable to answer question (e.g., compatibility with other med, storage)  Answer Assessment - Initial Assessment Questions 1. NAME of MEDICINE: "What medicine(s) are you calling about?"     Diflucan  100 mg  2. QUESTION: "What is your question?" (e.g., double dose of medicine, side effect)    Is medication safe to take, regarding kidney function?  3. PRESCRIBER: "Who prescribed the medicine?" Reason: if prescribed by specialist, call should be referred to that group.     Dr. Veleta Gerold 4. SYMPTOMS: "Do you have any symptoms?" If Yes, ask: "What symptoms are you having?"  "How bad are the symptoms (e.g., mild, moderate, severe)     Currently taking antibiotics, Bactrim  DS 800-160 mg 5. PREGNANCY:  "Is there any chance that you are pregnant?" "When was your last menstrual period?"     na  Protocols used: Medication Question Call-A-AH

## 2024-01-03 ENCOUNTER — Other Ambulatory Visit

## 2024-01-03 ENCOUNTER — Ambulatory Visit (HOSPITAL_COMMUNITY)
Admission: RE | Admit: 2024-01-03 | Discharge: 2024-01-03 | Disposition: A | Discharge status: Home / Self Care | Source: Ambulatory Visit | Attending: Cardiology | Admitting: Cardiology

## 2024-01-03 DIAGNOSIS — R0989 Other specified symptoms and signs involving the circulatory and respiratory systems: Secondary | ICD-10-CM | POA: Diagnosis present

## 2024-01-03 DIAGNOSIS — R195 Other fecal abnormalities: Secondary | ICD-10-CM | POA: Diagnosis not present

## 2024-01-03 LAB — CBC WITH DIFFERENTIAL/PLATELET
Basophils Absolute: 0.1 10*3/uL (ref 0.0–0.1)
Basophils Relative: 1 % (ref 0.0–3.0)
Eosinophils Absolute: 0.1 10*3/uL (ref 0.0–0.7)
Eosinophils Relative: 0.7 % (ref 0.0–5.0)
HCT: 37 % (ref 36.0–46.0)
Hemoglobin: 12.4 g/dL (ref 12.0–15.0)
Lymphocytes Relative: 18 % (ref 12.0–46.0)
Lymphs Abs: 2.1 10*3/uL (ref 0.7–4.0)
MCHC: 33.5 g/dL (ref 30.0–36.0)
MCV: 90.4 fl (ref 78.0–100.0)
Monocytes Absolute: 0.7 10*3/uL (ref 0.1–1.0)
Monocytes Relative: 5.5 % (ref 3.0–12.0)
Neutro Abs: 8.9 10*3/uL — ABNORMAL HIGH (ref 1.4–7.7)
Neutrophils Relative %: 74.8 % (ref 43.0–77.0)
Platelets: 231 10*3/uL (ref 150.0–400.0)
RBC: 4.1 Mil/uL (ref 3.87–5.11)
RDW: 14.1 % (ref 11.5–15.5)
WBC: 11.9 10*3/uL — ABNORMAL HIGH (ref 4.0–10.5)

## 2024-01-07 ENCOUNTER — Encounter (HOSPITAL_COMMUNITY): Payer: Self-pay

## 2024-01-07 ENCOUNTER — Other Ambulatory Visit: Payer: Self-pay

## 2024-01-07 ENCOUNTER — Encounter: Payer: Self-pay | Admitting: Internal Medicine

## 2024-01-07 ENCOUNTER — Ambulatory Visit: Payer: Self-pay

## 2024-01-07 ENCOUNTER — Encounter: Payer: Self-pay | Admitting: *Deleted

## 2024-01-07 DIAGNOSIS — I4891 Unspecified atrial fibrillation: Secondary | ICD-10-CM

## 2024-01-07 NOTE — Telephone Encounter (Signed)
 Summary: blood thinner//eliquis    Copied From CRM [id number]. Reason for Triage: The patient needs a refill of her apixaban  (ELIQUIS ) 5 MG TABS tablet blood thinner prescription. Her call back number is [phone].     05/12- Refill request sent

## 2024-01-08 ENCOUNTER — Other Ambulatory Visit: Payer: Self-pay | Admitting: *Deleted

## 2024-01-08 ENCOUNTER — Encounter: Admitting: Internal Medicine

## 2024-01-08 DIAGNOSIS — I4891 Unspecified atrial fibrillation: Secondary | ICD-10-CM

## 2024-01-08 MED ORDER — APIXABAN 5 MG PO TABS
5.0000 mg | ORAL_TABLET | Freq: Two times a day (BID) | ORAL | 0 refills | Status: DC
  Filled 2024-02-28 – 2024-03-19 (×2): qty 60, 30d supply, fill #0

## 2024-01-08 NOTE — Telephone Encounter (Signed)
 Refill sent in another encounter.

## 2024-01-14 ENCOUNTER — Encounter: Payer: Self-pay | Admitting: Internal Medicine

## 2024-01-14 ENCOUNTER — Ambulatory Visit: Admitting: Internal Medicine

## 2024-01-14 VITALS — BP 114/66 | HR 83 | Temp 97.9°F | Resp 9 | Ht 61.0 in | Wt 143.0 lb

## 2024-01-14 DIAGNOSIS — K5730 Diverticulosis of large intestine without perforation or abscess without bleeding: Secondary | ICD-10-CM

## 2024-01-14 DIAGNOSIS — I4891 Unspecified atrial fibrillation: Secondary | ICD-10-CM | POA: Diagnosis not present

## 2024-01-14 DIAGNOSIS — Z8719 Personal history of other diseases of the digestive system: Secondary | ICD-10-CM | POA: Diagnosis not present

## 2024-01-14 DIAGNOSIS — R195 Other fecal abnormalities: Secondary | ICD-10-CM

## 2024-01-14 DIAGNOSIS — D122 Benign neoplasm of ascending colon: Secondary | ICD-10-CM

## 2024-01-14 DIAGNOSIS — K648 Other hemorrhoids: Secondary | ICD-10-CM | POA: Diagnosis not present

## 2024-01-14 DIAGNOSIS — K52839 Microscopic colitis, unspecified: Secondary | ICD-10-CM

## 2024-01-14 DIAGNOSIS — I10 Essential (primary) hypertension: Secondary | ICD-10-CM | POA: Diagnosis not present

## 2024-01-14 DIAGNOSIS — E785 Hyperlipidemia, unspecified: Secondary | ICD-10-CM | POA: Diagnosis not present

## 2024-01-14 MED ORDER — SODIUM CHLORIDE 0.9 % IV SOLN: 500.0000 mL | Freq: Once | INTRAVENOUS | Status: DC

## 2024-01-14 NOTE — Progress Notes (Signed)
 Pt in a fib upon discharge.  Pt instructed to take eliquis  this evening.  Dr pyrtle aware of change in rhythm.  Pt states going in and out of a fib is not uncommon for her.

## 2024-01-14 NOTE — Progress Notes (Signed)
 VS by DT  Pt's states no medical or surgical changes since previsit or office visit.

## 2024-01-14 NOTE — Progress Notes (Signed)
 Called to room to assist during endoscopic procedure.  Patient ID and intended procedure confirmed with present staff. Received instructions for my participation in the procedure from the performing physician.

## 2024-01-14 NOTE — Progress Notes (Signed)
 Pt awake, alert and oriented. VSS. Airway intact. SBAR complete to RN. All questions answered.

## 2024-01-14 NOTE — Progress Notes (Signed)
 GASTROENTEROLOGY PROCEDURE H&P NOTE   Primary Care Physician: Eliodoro Guerin, DO    Reason for Procedure:  Hemoccult positive stool and history of microscopic colitis  Plan:    Colonoscopy  Patient is appropriate for endoscopic procedure(s) in the ambulatory ([facility]) setting.  The nature of the procedure, as well as the risks, benefits, and alternatives were carefully and thoroughly reviewed with the patient. Ample time for discussion and questions allowed. The patient understood, was satisfied, and agreed to proceed.     HPI: Kimberly Suarez is a 83 y.o. female who presents for colonoscopy.  Medical history as below.  Tolerated the prep.  No recent chest pain or shortness of breath.  No abdominal pain today.  Eliquis  on hold x 48 hours  Past Medical History:  Diagnosis Date   Allergy    Arthritis    ASCUS (atypical squamous cells of undetermined significance) on Pap smear 2011x2   Negative high-risk HPV, normal Pap smears 2012/2013   Atrial fibrillation (HCC)    Atypical nevus 05/02/2007   right back-moderate    Cancer (HCC)    Cataract    Diverticulosis    GERD (gastroesophageal reflux disease)    Hiatal hernia    Hyperlipemia    Hypertension    IBS (irritable bowel syndrome)    Kidney disease    Lymphocytic colitis 2007   Neuropathy    SCC (squamous cell carcinoma) 01/19/2006   bridge of nose (CX35FU)   SCC (squamous cell carcinoma) 03/08/2017   right inner lower shin (CX35FU)   SCC (squamous cell carcinoma) x 2 02/24/2002   right bridge of nose (Cx35FU),right bridge of nose   Uterine prolapse    Pessary    Past Surgical History:  Procedure Laterality Date   BREAST BIOPSY     unsure which breast   CATARACT EXTRACTION W/ INTRAOCULAR LENS IMPLANT Bilateral    COLONOSCOPY      Prior to Admission medications   Medication Sig Start Date End Date Taking? Authorizing Provider  Alpha-Lipoic Acid 600 MG CAPS Take by mouth.   Yes [provider]   apixaban  (ELIQUIS ) 5 MG TABS tablet Take 1 tablet (5 mg total) by mouth 2 (two) times daily. For afib 01/08/24  Yes Gottschalk, Ashly M, DO  atorvastatin  (LIPITOR) 40 MG tablet TAKE 1 TABLET BY MOUTH DAILY 12/06/23  Yes Vicky Grange M, DO  budesonide  (ENTOCORT EC ) 3 MG 24 hr capsule Take 9mg  daily for 4 weeks, take 6mg  for 4 weeks, take 3mg  for 4 weeks. GoodRx couponVernard Goldberg [id number], PCN [id number], Group [id number], Member ID [id number] 12/04/23  Yes McMichael, Bayley M, PA-C  enalapril  (VASOTEC ) 10 MG tablet TAKE 1 TABLET BY MOUTH TWICE  DAILY 12/06/23  Yes Gottschalk, Ashly M, DO  famotidine  (PEPCID ) 20 MG tablet Take 1 tablet (20 mg total) by mouth every other day. ** PLEASE NOTE THIS IS ADJUSTED FOR KIDNEY FUNCTION 06/26/23  Yes Gottschalk, Sharolyn Decant M, DO  fluconazole  (DIFLUCAN ) 100 MG tablet Take two with first dose. Then starting the next day take one daily until all are taken. 12/27/23  Yes Roise Cleaver, MD  fluticasone  (FLONASE ) 50 MCG/ACT nasal spray Place 1 spray into both nostrils 2 (two) times daily as needed for allergies or rhinitis. 07/17/19  Yes Hawks, Kathaleen Pale A, FNP  gabapentin  (NEURONTIN ) 100 MG capsule TAKE 3 CAPSULES BY MOUTH AT  BEDTIME 12/06/23  Yes Gottschalk, Ashly M, DO  Glucosamine-Chondroitin (GLUCOSAMINE CHONDR COMPLEX PO) Take 1 tablet by mouth 2 (two)  times daily.   Yes [provider]  Melatonin 10 MG CAPS Take by mouth at bedtime and may repeat dose one time if needed.   Yes [provider]  metoprolol  succinate (TOPROL  XL) 25 MG 24 hr tablet Take 1 tablet (25 mg total) by mouth daily. 09/14/23  Yes Tobb, Kardie, DO  Multiple Vitamins-Minerals (MULTIVITAMIN ADULT PO) Take 1 tablet by mouth daily.   Yes [provider]  Omega-3 Fatty Acids (FISH OIL) 1000 MG CAPS Take 1 capsule by mouth daily.   Yes [provider]  TURMERIC CURCUMIN PO Take 1,500 mg by mouth 2 (two) times daily.   Yes [provider]  vitamin B-12 (CYANOCOBALAMIN) 500 MCG tablet  Take 500 mcg by mouth daily.   Yes [provider]  VITAMIN D  PO Take by mouth.   Yes [provider]  acetaminophen  (TYLENOL ) 500 MG tablet Take 2 tablets (1,000 mg total) by mouth 3 (three) times daily. 05/15/23   Roise Cleaver, MD  AMBULATORY NON FORMULARY MEDICATION Budesonide  5mg    10 Mg daily x 4 weeks, 7.5 Mg daily x 4 weeks, 5 Mg daily x 4 weeks, to 2.5 Mg daily x 4 weeks and then discontinue Patient not taking: Reported on 12/05/2023 11/26/23   Garr Kalata, PA-C  hydrochlorothiazide  (HYDRODIURIL ) 25 MG tablet TAKE 1 TABLET BY MOUTH DAILY Patient taking differently: as needed. 06/12/22   Eliodoro Guerin, DO  predniSONE  (STERAPRED UNI-PAK 21 TAB) 10 MG (21) TBPK tablet As directed x 6 days Patient not taking: Reported on 11/26/2023 11/09/23   Eliodoro Guerin, DO  saccharomyces boulardii (FLORASTOR) 250 MG capsule Take 250 mg by mouth 2 (two) times daily.    [provider]  sulfamethoxazole -trimethoprim  (BACTRIM  DS) 800-160 MG tablet Take 1 tablet by mouth 2 (two) times daily. 12/27/23   Roise Cleaver, MD    Current Outpatient Medications  Medication Sig Dispense Refill   Alpha-Lipoic Acid 600 MG CAPS Take by mouth.     apixaban  (ELIQUIS ) 5 MG TABS tablet Take 1 tablet (5 mg total) by mouth 2 (two) times daily. For afib 180 tablet 0   atorvastatin  (LIPITOR) 40 MG tablet TAKE 1 TABLET BY MOUTH DAILY 100 tablet 1   budesonide  (ENTOCORT EC ) 3 MG 24 hr capsule Take 9mg  daily for 4 weeks, take 6mg  for 4 weeks, take 3mg  for 4 weeks. GoodRx coupon: BIN [id number], PCN [id number], Group [id number], Member ID [id number] 90 capsule 2   enalapril  (VASOTEC ) 10 MG tablet TAKE 1 TABLET BY MOUTH TWICE  DAILY 200 tablet 1   famotidine  (PEPCID ) 20 MG tablet Take 1 tablet (20 mg total) by mouth every other day. ** PLEASE NOTE THIS IS ADJUSTED FOR KIDNEY FUNCTION 45 tablet 3   fluconazole  (DIFLUCAN ) 100 MG tablet Take two with first dose. Then starting the next day take one daily until  all are taken. 15 tablet 0   fluticasone  (FLONASE ) 50 MCG/ACT nasal spray Place 1 spray into both nostrils 2 (two) times daily as needed for allergies or rhinitis. 16 g 6   gabapentin  (NEURONTIN ) 100 MG capsule TAKE 3 CAPSULES BY MOUTH AT  BEDTIME 300 capsule 1   Glucosamine-Chondroitin (GLUCOSAMINE CHONDR COMPLEX PO) Take 1 tablet by mouth 2 (two) times daily.     Melatonin 10 MG CAPS Take by mouth at bedtime and may repeat dose one time if needed.     metoprolol  succinate (TOPROL  XL) 25 MG 24 hr tablet Take 1 tablet (25 mg total)  by mouth daily. 90 tablet 3   Multiple Vitamins-Minerals (MULTIVITAMIN ADULT PO) Take 1 tablet by mouth daily.     Omega-3 Fatty Acids (FISH OIL) 1000 MG CAPS Take 1 capsule by mouth daily.     TURMERIC CURCUMIN PO Take 1,500 mg by mouth 2 (two) times daily.     vitamin B-12 (CYANOCOBALAMIN) 500 MCG tablet Take 500 mcg by mouth daily.     VITAMIN D  PO Take by mouth.     acetaminophen  (TYLENOL ) 500 MG tablet Take 2 tablets (1,000 mg total) by mouth 3 (three) times daily. 180 tablet PRN   AMBULATORY NON FORMULARY MEDICATION Budesonide  5mg    10 Mg daily x 4 weeks, 7.5 Mg daily x 4 weeks, 5 Mg daily x 4 weeks, to 2.5 Mg daily x 4 weeks and then discontinue (Patient not taking: Reported on 12/05/2023) 140 tablet 0   hydrochlorothiazide  (HYDRODIURIL ) 25 MG tablet TAKE 1 TABLET BY MOUTH DAILY (Patient taking differently: as needed.) 100 tablet 0   predniSONE  (STERAPRED UNI-PAK 21 TAB) 10 MG (21) TBPK tablet As directed x 6 days (Patient not taking: Reported on 11/26/2023) 21 tablet 0   saccharomyces boulardii (FLORASTOR) 250 MG capsule Take 250 mg by mouth 2 (two) times daily.     sulfamethoxazole -trimethoprim  (BACTRIM  DS) 800-160 MG tablet Take 1 tablet by mouth 2 (two) times daily. 14 tablet 0   Current Facility-Administered Medications  Medication Dose Route Frequency Provider Last Rate Last Admin   0.9 %  sodium chloride  infusion  500 mL Intravenous Once Markeshia Giebel, Amber Bail, MD         Allergies as of 01/14/2024   (No Known Allergies)    Family History  Problem Relation Age of Onset   Heart disease Mother    COPD Father    Asthma Father    Alzheimer's disease Sister    Multiple sclerosis Sister    Heart disease Sister    Diabetes Brother        BKA   Alzheimer's disease Brother    Diabetes Brother    Early death Brother        died in war   Stomach cancer Paternal Aunt    Colon cancer Neg Hx    Kidney disease Neg Hx    Liver disease Neg Hx    Esophageal cancer Neg Hx    Rectal cancer Neg Hx     Social History   Socioeconomic History   Marital status: Married    Spouse name: Not on file   Number of children: 3    Years of education: Not on file   Highest education level: 8th grade  Occupational History   Occupation: Retired   Tobacco Use   Smoking status: Never   Smokeless tobacco: Never  Vaping Use   Vaping status: Never Used  Substance and Sexual Activity   Alcohol use: Not Currently   Drug use: No   Sexual activity: Not Currently    Birth control/protection: Post-menopausal    Comment: 1st intercourse 15 yo--1 partner  Other Topics Concern   Not on file  Social History Narrative   Lives at home with husband.   Right-handed.   Caffeine use: one cup caffeine some days.   Social Drivers of Health   Financial Resource Strain: Low Risk  (10/09/2018)   Overall Financial Resource Strain (CARDIA)    Difficulty of Paying Living Expenses: Not hard at all  Food Insecurity: No Food Insecurity (10/09/2018)   Hunger Vital Sign  Worried About Programme researcher, broadcasting/film/video in the Last Year: Never true    Ran Out of Food in the Last Year: Never true  Transportation Needs: No Transportation Needs (10/09/2018)   PRAPARE - Administrator, Civil Service (Medical): No    Lack of Transportation (Non-Medical): No  Physical Activity: Insufficiently Active (10/09/2018)   Exercise Vital Sign    Days of Exercise per Week: 2 days    Minutes of  Exercise per Session: 30 min  Stress: No Stress Concern Present (10/09/2018)   [employer] of Occupational Health - Occupational Stress Questionnaire    Feeling of Stress : Not at all  Social Connections: Moderately Integrated (10/09/2018)   Social Connection and Isolation Panel [NHANES]    Frequency of Communication with Friends and Family: More than three times a week    Frequency of Social Gatherings with Friends and Family: More than three times a week    Attends Religious Services: More than 4 times per year    Active Member of [employer] or Organizations: No    Attends Banker Meetings: Never    Marital Status: Married  Catering manager Violence: Not At Risk (10/09/2018)   Humiliation, Afraid, Rape, and Kick questionnaire    Fear of Current or Ex-Partner: No    Emotionally Abused: No    Physically Abused: No    Sexually Abused: No    Physical Exam: Vital signs in last 24 hours: @BP  (!) 155/79   Pulse 60   Temp 97.9 F (36.6 C) (Skin)   Ht 5\' 1"  (1.549 m)   Wt 143 lb (64.9 kg)   SpO2 98%   BMI 27.02 kg/m  GEN: NAD EYE: Sclerae anicteric ENT: MMM CV: Non-tachycardic Pulm: CTA b/l GI: Soft, NT/ND NEURO:  Alert & Oriented x 3   Laurell Pond, MD [facility]  01/14/2024 2:36 PM

## 2024-01-14 NOTE — Op Note (Addendum)
 [facility] Patient Name: Kimberly Suarez Procedure Date: 01/14/2024 2:44 PM MRN: [id number] Endoscopist: Nannette Babe , MD, [id number] Age: 83 Referring MD:  Date of Birth: June 16, 1941 Gender: Female Account #: [id number] Procedure:                Colonoscopy Indications:              Heme positive stool, hx of microscopic colitis and                            currently at end of budesonide  taper, last                            colonoscopy at [facility] 2012 with Dr. Adan Holms Medicines:                Monitored Anesthesia Care Procedure:                Pre-Anesthesia Assessment:                           - Prior to the procedure, a History and Physical                            was performed, and patient medications and                            allergies were reviewed. The patient's tolerance of                            previous anesthesia was also reviewed. The risks                            and benefits of the procedure and the sedation                            options and risks were discussed with the patient.                            All questions were answered, and informed consent                            was obtained. Prior Anticoagulants: The patient has                            taken Eliquis  (apixaban ), last dose was 2 days                            prior to procedure. ASA Grade Assessment: III - A                            patient with severe systemic disease. After                            reviewing the risks and benefits, the patient was  deemed in satisfactory condition to undergo the                            procedure.                           After obtaining informed consent, the colonoscope                            was passed under direct vision. Throughout the                            procedure, the patient's blood pressure, pulse, and                            oxygen saturations were monitored continuously.  The                            Olympus Scope [id number] was introduced through the                            anus and advanced to the cecum, identified by                            appendiceal orifice and ileocecal valve. The                            colonoscopy was performed without difficulty. The                            patient tolerated the procedure well. The quality                            of the bowel preparation was good. The ileocecal                            valve, appendiceal orifice, and rectum were                            photographed. Scope In: 2:55:39 PM Scope Out: 3:13:26 PM Scope Withdrawal Time: 0 hours 13 minutes 31 seconds  Total Procedure Duration: 0 hours 17 minutes 47 seconds  Findings:                 The digital rectal exam was normal.                           A 3 mm polyp was found in the ascending colon. The                            polyp was sessile. The polyp was removed with a                            cold snare. Resection and retrieval were complete.  Multiple medium-mouthed and small-mouthed                            diverticula were found in the sigmoid colon.                           Internal hemorrhoids were found during                            retroflexion. The hemorrhoids were small.                           The exam was otherwise without abnormality.                           Biopsies for histology were taken with a cold                            forceps from the right colon and left colon for                            evaluation of microscopic colitis. Complications:            No immediate complications. Estimated Blood Loss:     Estimated blood loss: none. Impression:               - One 3 mm polyp in the ascending colon, removed                            with a cold snare. Resected and retrieved.                           - Moderate diverticulosis in the sigmoid colon.                            - Small internal hemorrhoids.                           - The examination was otherwise normal.                           - Biopsies were taken with a cold forceps from the                            right colon and left colon for evaluation of                            microscopic colitis. Recommendation:           - Patient has a contact number available for                            emergencies. The signs and symptoms of potential                            delayed complications were discussed with  the                            patient. Return to normal activities tomorrow.                            Written discharge instructions were provided to the                            patient.                           - Resume previous diet.                           - Continue present medications.                           - Resume Eliquis  (apixaban ) at prior dose today.                            Refer to managing physician for further adjustment                            of therapy.                           - Await pathology results.                           - No repeat routine colonoscopy due to age. Nannette Babe, MD 01/14/2024 3:17:14 PM This report has been signed electronically.

## 2024-01-14 NOTE — Patient Instructions (Signed)
 Restart your Eliquis  today.  YOU HAD AN ENDOSCOPIC PROCEDURE TODAY AT THE [facility]:   Refer to the procedure report that was given to you for any specific questions about what was found during the examination.  If the procedure report does not answer your questions, please call your gastroenterologist to clarify.  If you requested that your care partner not be given the details of your procedure findings, then the procedure report has been included in a sealed envelope for you to review at your convenience later.  YOU SHOULD EXPECT: Some feelings of bloating in the abdomen. Passage of more gas than usual.  Walking can help get rid of the air that was put into your GI tract during the procedure and reduce the bloating. If you had a lower endoscopy (such as a colonoscopy or flexible sigmoidoscopy) you may notice spotting of blood in your stool or on the toilet paper. If you underwent a bowel prep for your procedure, you may not have a normal bowel movement for a few days.  Please Note:  You might notice some irritation and congestion in your nose or some drainage.  This is from the oxygen used during your procedure.  There is no need for concern and it should clear up in a day or so.  SYMPTOMS TO REPORT IMMEDIATELY:  Following lower endoscopy (colonoscopy or flexible sigmoidoscopy):  Excessive amounts of blood in the stool  Significant tenderness or worsening of abdominal pains  Swelling of the abdomen that is new, acute  Fever of 100F or higher   For urgent or emergent issues, a gastroenterologist can be reached at any hour by calling [phone]. Do not use MyChart messaging for urgent concerns.    DIET:  We do recommend a small meal at first, but then you may proceed to your regular diet.  Drink plenty of fluids but you should avoid alcoholic beverages for 24 hours.  ACTIVITY:  You should plan to take it easy for the rest of today and you should NOT DRIVE or use heavy  machinery until tomorrow (because of the sedation medicines used during the test).    FOLLOW UP: Our staff will call the number listed on your records the next business day following your procedure.  We will call around 7:15- 8:00 am to check on you and address any questions or concerns that you may have regarding the information given to you following your procedure. If we do not reach you, we will leave a message.     If any biopsies were taken you will be contacted by phone or by letter within the next 1-3 weeks.  Please call us  at [phone] if you have not heard about the biopsies in 3 weeks.    SIGNATURES/CONFIDENTIALITY: You and/or your care partner have signed paperwork which will be entered into your electronic medical record.  These signatures attest to the fact that that the information above on your After Visit Summary has been reviewed and is understood.  Full responsibility of the confidentiality of this discharge information lies with you and/or your care-partner.

## 2024-01-15 ENCOUNTER — Telehealth: Payer: Self-pay

## 2024-01-15 NOTE — Telephone Encounter (Signed)
  Follow up Call-     01/14/2024    1:58 PM  Call back number  Post procedure Call Back phone  # [phone]  Permission to leave phone message Yes     Patient questions:  Do you have a fever, pain , or abdominal swelling? No. Pain Score  0 *  Have you tolerated food without any problems? Yes.    Have you been able to return to your normal activities? Yes.    Do you have any questions about your discharge instructions: Diet   No. Medications  No. Follow up visit  No.  Do you have questions or concerns about your Care? No.  Actions: * If pain score is 4 or above: No action needed, pain <4.

## 2024-01-17 LAB — SURGICAL PATHOLOGY

## 2024-01-24 ENCOUNTER — Ambulatory Visit: Payer: Self-pay | Admitting: Internal Medicine

## 2024-01-28 ENCOUNTER — Ambulatory Visit: Payer: Self-pay

## 2024-01-28 NOTE — Telephone Encounter (Signed)
  Chief Complaint: cough Symptoms: productive white cough Frequency: x 1.5 weeks Pertinent Negatives: Patient denies fever, SOB, CP Disposition: [] ED /[] Urgent Care (no appt availability in office) / [x] Appointment(In office/virtual)/ []  [facility]/ [] Home Care/ [] Refused Recommended Disposition /[] [facility]/ []  Follow-up with PCP Additional Notes: Pt c/o productive white cough and rattling sounds when breathing and has concerns for PNA. Pt denies any fever, SOB, or CP. Scheduled patient per protocol on 01/29/2024 with alternate provider. Patient verbalized understanding and to call back with worsening symptoms.     Copied from CRM [id number]. Topic: Clinical - Medical Advice >> Jan 28, 2024 11:12 AM Crispin Dolphin wrote: Reason for CRM: Patient called. States she has had a cough for 9 days. Has kidney disease scared to take anything without consulting pcp. Would like to know if they can send something for her or if she needs to come in. Would like a call back. Thank You Reason for Disposition  SEVERE coughing spells (e.g., whooping sound after coughing, vomiting after coughing)  Answer Assessment - Initial Assessment Questions 1. ONSET: "When did the cough begin?"      X 9 days 2. SEVERITY: "How bad is the cough today?"      Continuous cough 3. SPUTUM: "Describe the color of your sputum" (none, dry cough; clear, white, yellow, green)     White, rattling sounds 4. HEMOPTYSIS: "Are you coughing up any blood?" If so ask: "How much?" (flecks, streaks, tablespoons, etc.)     denies 5. DIFFICULTY BREATHING: "Are you having difficulty breathing?" If Yes, ask: "How bad is it?" (e.g., mild, moderate, severe)    - MILD: No SOB at rest, mild SOB with walking, speaks normally in sentences, can lie down, no retractions, pulse < 100.    - MODERATE: SOB at rest, SOB with minimal exertion and prefers to sit, cannot lie down flat, speaks in phrases, mild retractions, audible wheezing,  pulse 100-120.    - SEVERE: Very SOB at rest, speaks in single words, struggling to breathe, sitting hunched forward, retractions, pulse > 120      Just coughing spells 6. FEVER: "Do you have a fever?" If Yes, ask: "What is your temperature, how was it measured, and when did it start?"     denies 7. CARDIAC HISTORY: "Do you have any history of heart disease?" (e.g., heart attack, congestive heart failure)      Hx a fib Denies other 8. LUNG HISTORY: "Do you have any history of lung disease?"  (e.g., pulmonary embolus, asthma, emphysema)     denies 9. PE RISK FACTORS: "Do you have a history of blood clots?" (or: recent major surgery, recent prolonged travel, bedridden)     denies 10. OTHER SYMPTOMS: "Do you have any other symptoms?" (e.g., runny nose, wheezing, chest pain)       Runny nose, rattling 11. PREGNANCY: "Is there any chance you are pregnant?" "When was your last menstrual period?"       N/a 12. TRAVEL: "Have you traveled out of the country in the last month?" (e.g., travel history, exposures)       N/a  Protocols used: Cough - Acute Productive-A-AH

## 2024-01-28 NOTE — Telephone Encounter (Signed)
 Closing pt is being seen in office tomorrow

## 2024-01-29 ENCOUNTER — Ambulatory Visit: Admitting: Family Medicine

## 2024-01-29 ENCOUNTER — Encounter: Payer: Self-pay | Admitting: Family Medicine

## 2024-01-29 VITALS — BP 114/57 | HR 60 | Temp 98.5°F | Ht 61.0 in | Wt 140.0 lb

## 2024-01-29 DIAGNOSIS — N3090 Cystitis, unspecified without hematuria: Secondary | ICD-10-CM

## 2024-01-29 DIAGNOSIS — R051 Acute cough: Secondary | ICD-10-CM

## 2024-01-29 DIAGNOSIS — N1831 Chronic kidney disease, stage 3a: Secondary | ICD-10-CM | POA: Diagnosis not present

## 2024-01-29 DIAGNOSIS — R0989 Other specified symptoms and signs involving the circulatory and respiratory systems: Secondary | ICD-10-CM | POA: Diagnosis not present

## 2024-01-29 LAB — URINALYSIS, ROUTINE W REFLEX MICROSCOPIC
Bilirubin, UA: NEGATIVE
Glucose, UA: NEGATIVE
Nitrite, UA: NEGATIVE
Protein,UA: NEGATIVE
Specific Gravity, UA: 1.01 (ref 1.005–1.030)
Urobilinogen, Ur: 0.2 mg/dL (ref 0.2–1.0)
pH, UA: 6 (ref 5.0–7.5)

## 2024-01-29 LAB — MICROSCOPIC EXAMINATION
Renal Epithel, UA: NONE SEEN /HPF
Yeast, UA: NONE SEEN

## 2024-01-29 MED ORDER — AMOXICILLIN-POT CLAVULANATE 875-125 MG PO TABS: 1.0000 | ORAL_TABLET | Freq: Two times a day (BID) | ORAL | 0 refills | Status: AC

## 2024-01-29 NOTE — Progress Notes (Signed)
 Subjective:  Patient ID: Kimberly Suarez, female    DOB: [birth year], 83 y.o.   MRN: [id number]  Patient Care Team: Eliodoro Guerin, DO as PCP - General (Family Medicine) Eilleen Grates, MD as PCP - Cardiology (Cardiology) Pyrtle, Amber Bail, MD as Consulting Physician (Gastroenterology) Dorthey Gave, PA-C as Physician Assistant (Dermatology) Charley Constable, MD as Consulting Physician (Nephrology) Jorie Newness, Blondie Burke, MD as Consulting Physician (Obstetrics and Gynecology)   Chief Complaint:  productive cough (X 9 days/)  HPI: Kimberly Suarez is a 83 y.o. female presenting on 01/29/2024 for productive cough (X 9 days/)  HPI Presents today with rhinorrhea, chest tightness, cough. Denies fever, ear pain, sinus congestion. Symptoms started 9 days ago. She is not taking anything OTC as she was scared to take it due to CKD. She notes that she had UTI about one month ago and had bactrim  and diflucan  for yeast infection. States that she notes she now has an odor and sediment in her urine. Denies burning, itching, discharge.   Relevant past medical, surgical, family, and social history reviewed and updated as indicated.  Allergies and medications reviewed and updated. Data reviewed: Chart in Epic.   Past Medical History:  Diagnosis Date   Allergy    Arthritis    ASCUS (atypical squamous cells of undetermined significance) on Pap smear 2011x2   Negative high-risk HPV, normal Pap smears 2012/2013   Atrial fibrillation (HCC)    Atypical nevus 05/02/2007   right back-moderate    Cancer (HCC)    Cataract    Diverticulosis    GERD (gastroesophageal reflux disease)    Hiatal hernia    Hyperlipemia    Hypertension    IBS (irritable bowel syndrome)    Kidney disease    Lymphocytic colitis 2007   Neuropathy    SCC (squamous cell carcinoma) 01/19/2006   bridge of nose (CX35FU)   SCC (squamous cell carcinoma) 03/08/2017   right inner lower shin (CX35FU)   SCC (squamous  cell carcinoma) x 2 02/24/2002   right bridge of nose (Cx35FU),right bridge of nose   Uterine prolapse    Pessary   Past Surgical History:  Procedure Laterality Date   BREAST BIOPSY     unsure which breast   CATARACT EXTRACTION W/ INTRAOCULAR LENS IMPLANT Bilateral    COLONOSCOPY      Social History   Socioeconomic History   Marital status: Married    Spouse name: Not on file   Number of children: 3    Years of education: Not on file   Highest education level: 8th grade  Occupational History   Occupation: Retired   Tobacco Use   Smoking status: Never   Smokeless tobacco: Never  Vaping Use   Vaping status: Never Used  Substance and Sexual Activity   Alcohol use: Not Currently   Drug use: No   Sexual activity: Not Currently    Birth control/protection: Post-menopausal    Comment: 1st intercourse 15 yo--1 partner  Other Topics Concern   Not on file  Social History Narrative   Lives at home with husband.   Right-handed.   Caffeine use: one cup caffeine some days.   Social Drivers of Corporate investment banker Strain: Low Risk  (10/09/2018)   Overall Financial Resource Strain (CARDIA)    Difficulty of Paying Living Expenses: Not hard at all  Food Insecurity: No Food Insecurity (01/29/2024)   Hunger Vital Sign    Worried About Running  Out of Food in the Last Year: Never true    Ran Out of Food in the Last Year: Never true  Transportation Needs: No Transportation Needs (01/29/2024)   PRAPARE - Administrator, Civil Service (Medical): No    Lack of Transportation (Non-Medical): No  Physical Activity: Insufficiently Active (10/09/2018)   Exercise Vital Sign    Days of Exercise per Week: 2 days    Minutes of Exercise per Session: 30 min  Stress: No Stress Concern Present (10/09/2018)   [employer] of Occupational Health - Occupational Stress Questionnaire    Feeling of Stress : Not at all  Social Connections: Moderately Integrated (10/09/2018)   Social  Connection and Isolation Panel [NHANES]    Frequency of Communication with Friends and Family: More than three times a week    Frequency of Social Gatherings with Friends and Family: More than three times a week    Attends Religious Services: More than 4 times per year    Active Member of [employer] or Organizations: No    Attends Banker Meetings: Never    Marital Status: Married  Catering manager Violence: Not At Risk (01/29/2024)   Humiliation, Afraid, Rape, and Kick questionnaire    Fear of Current or Ex-Partner: No    Emotionally Abused: No    Physically Abused: No    Sexually Abused: No    Outpatient Encounter Medications as of 01/29/2024  Medication Sig   acetaminophen  (TYLENOL ) 500 MG tablet Take 2 tablets (1,000 mg total) by mouth 3 (three) times daily.   Alpha-Lipoic Acid 600 MG CAPS Take by mouth.   apixaban  (ELIQUIS ) 5 MG TABS tablet Take 1 tablet (5 mg total) by mouth 2 (two) times daily. For afib   atorvastatin  (LIPITOR) 40 MG tablet TAKE 1 TABLET BY MOUTH DAILY   budesonide  (ENTOCORT EC ) 3 MG 24 hr capsule Take 9mg  daily for 4 weeks, take 6mg  for 4 weeks, take 3mg  for 4 weeks. GoodRx coupon: BIN [id number], PCN [id number], Group [id number], Member ID [id number]   enalapril  (VASOTEC ) 10 MG tablet TAKE 1 TABLET BY MOUTH TWICE  DAILY   famotidine  (PEPCID ) 20 MG tablet Take 1 tablet (20 mg total) by mouth every other day. ** PLEASE NOTE THIS IS ADJUSTED FOR KIDNEY FUNCTION   fluconazole  (DIFLUCAN ) 100 MG tablet Take two with first dose. Then starting the next day take one daily until all are taken.   fluticasone  (FLONASE ) 50 MCG/ACT nasal spray Place 1 spray into both nostrils 2 (two) times daily as needed for allergies or rhinitis.   gabapentin  (NEURONTIN ) 100 MG capsule TAKE 3 CAPSULES BY MOUTH AT  BEDTIME   Glucosamine-Chondroitin (GLUCOSAMINE CHONDR COMPLEX PO) Take 1 tablet by mouth 2 (two) times daily.   hydrochlorothiazide  (HYDRODIURIL ) 25 MG tablet TAKE 1 TABLET BY MOUTH DAILY  (Patient taking differently: as needed.)   Melatonin 10 MG CAPS Take by mouth at bedtime and may repeat dose one time if needed.   metoprolol  succinate (TOPROL  XL) 25 MG 24 hr tablet Take 1 tablet (25 mg total) by mouth daily.   Multiple Vitamins-Minerals (MULTIVITAMIN ADULT PO) Take 1 tablet by mouth daily.   Omega-3 Fatty Acids (FISH OIL) 1000 MG CAPS Take 1 capsule by mouth daily.   saccharomyces boulardii (FLORASTOR) 250 MG capsule Take 250 mg by mouth 2 (two) times daily.   sulfamethoxazole -trimethoprim  (BACTRIM  DS) 800-160 MG tablet Take 1 tablet by mouth 2 (two) times daily.   TURMERIC CURCUMIN PO Take 1,500 mg  by mouth 2 (two) times daily.   vitamin B-12 (CYANOCOBALAMIN) 500 MCG tablet Take 500 mcg by mouth daily.   VITAMIN D  PO Take by mouth.   [DISCONTINUED] AMBULATORY NON FORMULARY MEDICATION Budesonide  5mg    10 Mg daily x 4 weeks, 7.5 Mg daily x 4 weeks, 5 Mg daily x 4 weeks, to 2.5 Mg daily x 4 weeks and then discontinue (Patient not taking: Reported on 12/05/2023)   [DISCONTINUED] predniSONE  (STERAPRED UNI-PAK 21 TAB) 10 MG (21) TBPK tablet As directed x 6 days (Patient not taking: Reported on 11/26/2023)   No facility-administered encounter medications on file as of 01/29/2024.    No Known Allergies  Review of Systems As per HPI  Objective:  BP (!) 114/57   Pulse 60   Temp 98.5 F (36.9 C)   Ht 5\' 1"  (1.549 m)   Wt 140 lb (63.5 kg)   SpO2 92%   BMI 26.45 kg/m    Wt Readings from Last 3 Encounters:  01/29/24 140 lb (63.5 kg)  01/14/24 143 lb (64.9 kg)  12/27/23 142 lb (64.4 kg)   Physical Exam Constitutional:      General: She is awake. She is not in acute distress.    Appearance: Normal appearance. She is well-developed, well-groomed and overweight. She is not ill-appearing, toxic-appearing or diaphoretic.  HENT:     Right Ear: A middle ear effusion is present. Tympanic membrane is scarred. Tympanic membrane is not perforated, erythematous, retracted or bulging.      Left Ear: There is impacted cerumen.  Cardiovascular:     Rate and Rhythm: Normal rate and regular rhythm.     Pulses: Normal pulses.          Radial pulses are 2+ on the right side and 2+ on the left side.       Posterior tibial pulses are 2+ on the right side and 2+ on the left side.     Heart sounds: Normal heart sounds. No murmur heard.    No gallop.  Pulmonary:     Effort: Pulmonary effort is normal. No respiratory distress.     Breath sounds: Transmitted upper airway sounds present. No stridor. Examination of the right-lower field reveals rhonchi and rales. Examination of the left-lower field reveals rhonchi. Rhonchi and rales present. No wheezing.  Musculoskeletal:     Cervical back: Full passive range of motion without pain and neck supple.     Right lower leg: No edema.     Left lower leg: No edema.  Lymphadenopathy:     Head:     Right side of head: No submental, submandibular, tonsillar or preauricular adenopathy.     Left side of head: No submental, submandibular, tonsillar or preauricular adenopathy.  Skin:    General: Skin is warm.     Capillary Refill: Capillary refill takes less than 2 seconds.  Neurological:     General: No focal deficit present.     Mental Status: She is alert, oriented to person, place, and time and easily aroused. Mental status is at baseline.     GCS: GCS eye subscore is 4. GCS verbal subscore is 5. GCS motor subscore is 6.     Motor: No weakness.  Psychiatric:        Attention and Perception: Attention and perception normal.        Mood and Affect: Mood and affect normal.        Speech: Speech normal.        Behavior:  Behavior normal. Behavior is cooperative.        Thought Content: Thought content normal. Thought content does not include homicidal or suicidal ideation. Thought content does not include homicidal or suicidal plan.        Cognition and Memory: Cognition and memory normal.        Judgment: Judgment normal.     Results for  orders placed or performed in visit on 01/14/24  Surgical pathology ([facility])   Collection Time: 01/14/24 12:00 AM  Result Value Ref Range   SURGICAL PATHOLOGY      SURGICAL PATHOLOGY [facility] [address] [place], [place] [address] Telephone [phone] or [phone] Fax [phone]  REPORT OF SURGICAL PATHOLOGY   Accession #: [id number] Patient Name: OLUWASEMILORE, PASCUZZI Visit # : [id number]  MRN: [id number] Physician: Nannette Babe. DOB/Age 08/16/1941 (Age: 46) Gender: F Collected Date: 01/14/2024 Received Date: 01/16/2024  FINAL DIAGNOSIS       1. Surgical [P], colon, ascending, polyp (1) :       - TUBULAR ADENOMA      - NEGATIVE FOR HIGH-GRADE DYSPLASIA OR MALIGNANCY       2. Surgical [P], random colon sites :       - COLONIC MUCOSA WITH NO SPECIFIC HISTOPATHOLOGIC CHANGES      - NEGATIVE FOR ACUTE INFLAMMATION, INCREASED INTRAEPITHELIAL LYMPHOCYTES OR      THICKENED SUBEPITHELIAL COLLAGEN TABLE       ELECTRONIC SIGNATURE : Kashikar Md, Nilesh, Sports administrator, International aid/development worker  MICROSCOPIC DESCRIPTION  CASE COMMENTS STAINS USED IN DIAGNOSIS: H&E H&E    CLINICAL HI STORY  SPECIMEN(S) OBTAINED 1. Surgical [P], Colon, Ascending, Polyp (1) 2. Surgical [P], Random Colon Sites  SPECIMEN COMMENTS: 1. Heme positive stool; microscopic colitis, unspecified microscopic colitis type; benign neoplasm of ascending colon SPECIMEN CLINICAL INFORMATION: 1. R/O adenoma 2. R/O microscopic colitis    Gross Description 1. Received in formalin are tan, soft tissue fragments that are submitted in toto.Number: 1 Size: 0.4 cm, (1B) ( TA ) 2. Received in formalin are tan, soft tissue fragments that are submitted in toto.Number: multiple, Size: 0.2 cm smallest to 0.3 cm largest, (1B) ( TA )        Report signed out from the following location(s) [place] [address], [place] [address]  CLIA #: [id number]  [facility] [address] [place], [place] [address] CLIA #: [id number]        01/29/2024   11:28 AM 11/09/2023   11:29 AM 10/26/2023   10:34 AM 10/26/2023   10:23 AM 07/05/2023    3:59 PM  Depression screen PHQ 2/9  Decreased Interest 0 0 0 0 0  Down, Depressed, Hopeless 0 0 0 0 0  PHQ - 2 Score 0 0 0 0 0  Altered sleeping 2 1 2  0 0  Tired, decreased energy 2 1 2  0 0  Change in appetite 0 0 0 0 0  Feeling bad or failure about yourself  0 0 0 0 0  Trouble concentrating 0 0 0 0 0  Moving slowly or fidgety/restless 0 0 0 0 0  Suicidal thoughts 0 0 0 0 0  PHQ-9 Score 4 2 4  0 0  Difficult doing work/chores  Somewhat difficult Somewhat difficult  Not difficult at all       01/29/2024   11:28 AM 11/09/2023   11:29 AM 10/26/2023   10:34 AM 10/26/2023  10:23 AM  GAD 7 : Generalized Anxiety Score  Nervous, Anxious, on Edge 0 0 0 1  Control/stop worrying 2 1 2 1   Worry too much - different things 2 1 2  0  Trouble relaxing 0 0 0 1  Restless 0 0 0 0  Easily annoyed or irritable 0 0 0 0  Afraid - awful might happen 2 0 0 0  Total GAD 7 Score 6 2 4 3   Anxiety Difficulty  Somewhat difficult Somewhat difficult    Pertinent labs & imaging results that were available during my care of the patient were reviewed by me and considered in my medical decision making.  Assessment & Plan:  Kimberly Suarez was seen today for productive cough.  Diagnoses and all orders for this visit:  Acute cough Provided patient with nebulizer treatment which improved lung sounds and oxygen saturation. Will provide abx as below to cover for PNA. Recommend patient continue to monitor for worsening symptoms. Discussed at home care.  -     amoxicillin -clavulanate (AUGMENTIN ) 875-125 MG tablet; Take 1 tablet by mouth 2 (two) times daily for 7 days.  Rhonchi As above.  -     amoxicillin -clavulanate (AUGMENTIN ) 875-125 MG tablet; Take 1 tablet by mouth 2 (two) times daily for 7  days.  Cystitis Labs as below. Will communicate results to patient once available. Will await results to determine next steps.  -     Urine Culture -     Urinalysis, Routine w reflex microscopic   Stage 3a chronic kidney disease (HCC) Based on CKD, no renal adjustment necessary for augmentin .   Continue all other maintenance medications.  Follow up plan: Return if symptoms worsen or fail to improve.   Continue healthy lifestyle choices, including diet (rich in fruits, vegetables, and lean proteins, and low in salt and simple carbohydrates) and exercise (at least 30 minutes of moderate physical activity daily).  Written and verbal instructions provided   The above assessment and management plan was discussed with the patient. The patient verbalized understanding of and has agreed to the management plan. Patient is aware to call the clinic if they develop any new symptoms or if symptoms persist or worsen. Patient is aware when to return to the clinic for a follow-up visit. Patient educated on when it is appropriate to go to the emergency department.   Jacqualyn Mates, DNP-FNP [facility] [address] [place], [place] [address] [phone]

## 2024-01-30 MED ORDER — IPRATROPIUM-ALBUTEROL 0.5-2.5 (3) MG/3ML IN SOLN
3.0000 mL | Freq: Once | RESPIRATORY_TRACT | Status: AC
  Administered 2024-01-29: 3 mL via RESPIRATORY_TRACT

## 2024-01-30 NOTE — Addendum Note (Signed)
 Addended by: Lucinda Saber D on: 01/30/2024 02:29 PM   Modules accepted: Orders

## 2024-01-31 ENCOUNTER — Ambulatory Visit: Payer: Self-pay | Admitting: Family Medicine

## 2024-01-31 LAB — URINE CULTURE

## 2024-01-31 NOTE — Progress Notes (Signed)
 Negative UTI. Follow up if symptoms continue.

## 2024-02-20 DIAGNOSIS — M170 Bilateral primary osteoarthritis of knee: Secondary | ICD-10-CM | POA: Diagnosis not present

## 2024-02-23 ENCOUNTER — Encounter (HOSPITAL_COMMUNITY): Payer: Self-pay

## 2024-02-23 ENCOUNTER — Emergency Department (HOSPITAL_COMMUNITY)

## 2024-02-23 ENCOUNTER — Inpatient Hospital Stay (HOSPITAL_COMMUNITY)
Admission: EM | Admit: 2024-02-23 | Discharge: 2024-02-28 | DRG: 280 | Disposition: A | Discharge status: Home Health | Attending: Internal Medicine | Admitting: Internal Medicine

## 2024-02-23 ENCOUNTER — Other Ambulatory Visit: Payer: Self-pay

## 2024-02-23 DIAGNOSIS — I5031 Acute diastolic (congestive) heart failure: Secondary | ICD-10-CM | POA: Diagnosis not present

## 2024-02-23 DIAGNOSIS — K219 Gastro-esophageal reflux disease without esophagitis: Secondary | ICD-10-CM | POA: Diagnosis present

## 2024-02-23 DIAGNOSIS — I4891 Unspecified atrial fibrillation: Secondary | ICD-10-CM | POA: Diagnosis not present

## 2024-02-23 DIAGNOSIS — N179 Acute kidney failure, unspecified: Secondary | ICD-10-CM | POA: Diagnosis not present

## 2024-02-23 DIAGNOSIS — J90 Pleural effusion, not elsewhere classified: Secondary | ICD-10-CM | POA: Diagnosis not present

## 2024-02-23 DIAGNOSIS — Z825 Family history of asthma and other chronic lower respiratory diseases: Secondary | ICD-10-CM

## 2024-02-23 DIAGNOSIS — Z833 Family history of diabetes mellitus: Secondary | ICD-10-CM | POA: Diagnosis not present

## 2024-02-23 DIAGNOSIS — G629 Polyneuropathy, unspecified: Secondary | ICD-10-CM | POA: Diagnosis present

## 2024-02-23 DIAGNOSIS — I110 Hypertensive heart disease with heart failure: Secondary | ICD-10-CM | POA: Diagnosis not present

## 2024-02-23 DIAGNOSIS — R7989 Other specified abnormal findings of blood chemistry: Secondary | ICD-10-CM

## 2024-02-23 DIAGNOSIS — Z8249 Family history of ischemic heart disease and other diseases of the circulatory system: Secondary | ICD-10-CM | POA: Diagnosis not present

## 2024-02-23 DIAGNOSIS — I428 Other cardiomyopathies: Secondary | ICD-10-CM | POA: Diagnosis present

## 2024-02-23 DIAGNOSIS — Z7901 Long term (current) use of anticoagulants: Secondary | ICD-10-CM

## 2024-02-23 DIAGNOSIS — Z800 Family history of malignant neoplasm of digestive organs: Secondary | ICD-10-CM | POA: Diagnosis not present

## 2024-02-23 DIAGNOSIS — I5041 Acute combined systolic (congestive) and diastolic (congestive) heart failure: Secondary | ICD-10-CM | POA: Diagnosis present

## 2024-02-23 DIAGNOSIS — I517 Cardiomegaly: Secondary | ICD-10-CM | POA: Diagnosis not present

## 2024-02-23 DIAGNOSIS — R079 Chest pain, unspecified: Secondary | ICD-10-CM | POA: Diagnosis not present

## 2024-02-23 DIAGNOSIS — E861 Hypovolemia: Secondary | ICD-10-CM | POA: Diagnosis present

## 2024-02-23 DIAGNOSIS — I130 Hypertensive heart and chronic kidney disease with heart failure and stage 1 through stage 4 chronic kidney disease, or unspecified chronic kidney disease: Secondary | ICD-10-CM | POA: Diagnosis present

## 2024-02-23 DIAGNOSIS — N1832 Chronic kidney disease, stage 3b: Secondary | ICD-10-CM | POA: Diagnosis present

## 2024-02-23 DIAGNOSIS — Z85828 Personal history of other malignant neoplasm of skin: Secondary | ICD-10-CM | POA: Diagnosis not present

## 2024-02-23 DIAGNOSIS — Z820 Family history of epilepsy and other diseases of the nervous system: Secondary | ICD-10-CM | POA: Diagnosis not present

## 2024-02-23 DIAGNOSIS — E785 Hyperlipidemia, unspecified: Secondary | ICD-10-CM | POA: Diagnosis present

## 2024-02-23 DIAGNOSIS — I509 Heart failure, unspecified: Principal | ICD-10-CM

## 2024-02-23 DIAGNOSIS — Z79899 Other long term (current) drug therapy: Secondary | ICD-10-CM | POA: Diagnosis not present

## 2024-02-23 DIAGNOSIS — E871 Hypo-osmolality and hyponatremia: Secondary | ICD-10-CM | POA: Diagnosis present

## 2024-02-23 DIAGNOSIS — I21A1 Myocardial infarction type 2: Secondary | ICD-10-CM | POA: Diagnosis present

## 2024-02-23 DIAGNOSIS — I2510 Atherosclerotic heart disease of native coronary artery without angina pectoris: Secondary | ICD-10-CM | POA: Diagnosis present

## 2024-02-23 DIAGNOSIS — I480 Paroxysmal atrial fibrillation: Secondary | ICD-10-CM | POA: Diagnosis present

## 2024-02-23 DIAGNOSIS — Z555 Less than a high school diploma: Secondary | ICD-10-CM | POA: Diagnosis not present

## 2024-02-23 DIAGNOSIS — R0989 Other specified symptoms and signs involving the circulatory and respiratory systems: Secondary | ICD-10-CM | POA: Diagnosis not present

## 2024-02-23 DIAGNOSIS — R0602 Shortness of breath: Secondary | ICD-10-CM | POA: Diagnosis not present

## 2024-02-23 DIAGNOSIS — I5021 Acute systolic (congestive) heart failure: Secondary | ICD-10-CM | POA: Diagnosis not present

## 2024-02-23 DIAGNOSIS — I214 Non-ST elevation (NSTEMI) myocardial infarction: Secondary | ICD-10-CM | POA: Diagnosis not present

## 2024-02-23 LAB — BASIC METABOLIC PANEL WITH GFR
Anion gap: 12 (ref 5–15)
BUN: 36 mg/dL — ABNORMAL HIGH (ref 8–23)
CO2: 24 mmol/L (ref 22–32)
Calcium: 9.1 mg/dL (ref 8.9–10.3)
Chloride: 92 mmol/L — ABNORMAL LOW (ref 98–111)
Creatinine, Ser: 1.04 mg/dL — ABNORMAL HIGH (ref 0.44–1.00)
GFR, Estimated: 53 mL/min — ABNORMAL LOW
Glucose, Bld: 132 mg/dL — ABNORMAL HIGH (ref 70–99)
Potassium: 4 mmol/L (ref 3.5–5.1)
Sodium: 128 mmol/L — ABNORMAL LOW (ref 135–145)

## 2024-02-23 LAB — HEPATIC FUNCTION PANEL
ALT: 136 U/L — ABNORMAL HIGH (ref 0–44)
AST: 88 U/L — ABNORMAL HIGH (ref 15–41)
Albumin: 3.9 g/dL (ref 3.5–5.0)
Alkaline Phosphatase: 46 U/L (ref 38–126)
Bilirubin, Direct: 0.1 mg/dL (ref 0.0–0.2)
Indirect Bilirubin: 1 mg/dL — ABNORMAL HIGH (ref 0.3–0.9)
Total Bilirubin: 1.1 mg/dL (ref 0.0–1.2)
Total Protein: 7.7 g/dL (ref 6.5–8.1)

## 2024-02-23 LAB — SODIUM, URINE, RANDOM: Sodium, Ur: 122 mmol/L

## 2024-02-23 LAB — HEPARIN LEVEL (UNFRACTIONATED): Heparin Unfractionated: >1.1 [IU]/mL — ABNORMAL HIGH (ref 0.30–0.70)

## 2024-02-23 LAB — APTT: aPTT: 32 s (ref 24–36)

## 2024-02-23 LAB — CBC
HCT: 37.2 % (ref 36.0–46.0)
Hemoglobin: 12.1 g/dL (ref 12.0–15.0)
MCH: 30.6 pg (ref 26.0–34.0)
MCHC: 32.5 g/dL (ref 30.0–36.0)
MCV: 94.2 fL (ref 80.0–100.0)
Platelets: 228 10*3/uL (ref 150–400)
RBC: 3.95 MIL/uL (ref 3.87–5.11)
RDW: 13.8 % (ref 11.5–15.5)
WBC: 17.4 10*3/uL — ABNORMAL HIGH (ref 4.0–10.5)
nRBC: 0 % (ref 0.0–0.2)

## 2024-02-23 LAB — TROPONIN I (HIGH SENSITIVITY)
Troponin I (High Sensitivity): 223 ng/L
Troponin I (High Sensitivity): 337 ng/L

## 2024-02-23 LAB — TSH: TSH: 2.766 u[IU]/mL (ref 0.350–4.500)

## 2024-02-23 LAB — MAGNESIUM: Magnesium: 1.9 mg/dL (ref 1.7–2.4)

## 2024-02-23 MED ORDER — GABAPENTIN 300 MG PO CAPS
300.0000 mg | ORAL_CAPSULE | Freq: Every day | ORAL | Status: DC
  Administered 2024-02-23 – 2024-02-27 (×5): 300 mg via ORAL
  Filled 2024-02-23 (×5): qty 1

## 2024-02-23 MED ORDER — ONDANSETRON HCL 4 MG/2ML IJ SOLN: 4.0000 mg | Freq: Four times a day (QID) | INTRAMUSCULAR | Status: DC | PRN

## 2024-02-23 MED ORDER — FAMOTIDINE 20 MG PO TABS: 20.0000 mg | ORAL_TABLET | ORAL | Status: DC

## 2024-02-23 MED ORDER — METOPROLOL SUCCINATE ER 25 MG PO TB24
25.0000 mg | ORAL_TABLET | Freq: Every day | ORAL | Status: DC
  Administered 2024-02-24: 25 mg via ORAL
  Filled 2024-02-23 (×3): qty 1

## 2024-02-23 MED ORDER — ATORVASTATIN CALCIUM 40 MG PO TABS
40.0000 mg | ORAL_TABLET | Freq: Every day | ORAL | Status: DC
  Administered 2024-02-23 – 2024-02-27 (×5): 40 mg via ORAL
  Filled 2024-02-23 (×5): qty 1

## 2024-02-23 MED ORDER — ONDANSETRON HCL 4 MG PO TABS: 4.0000 mg | ORAL_TABLET | Freq: Four times a day (QID) | ORAL | Status: DC | PRN

## 2024-02-23 MED ORDER — ACETAMINOPHEN 325 MG PO TABS: 650.0000 mg | ORAL_TABLET | Freq: Four times a day (QID) | ORAL | Status: DC | PRN

## 2024-02-23 MED ORDER — ASPIRIN 81 MG PO CHEW
324.0000 mg | CHEWABLE_TABLET | Freq: Once | ORAL | Status: AC
  Administered 2024-02-23: 324 mg via ORAL
  Filled 2024-02-23: qty 4

## 2024-02-23 MED ORDER — ACETAMINOPHEN 650 MG RE SUPP: 650.0000 mg | Freq: Four times a day (QID) | RECTAL | Status: DC | PRN

## 2024-02-23 MED ORDER — ENALAPRIL MALEATE 10 MG PO TABS
10.0000 mg | ORAL_TABLET | Freq: Two times a day (BID) | ORAL | Status: DC
  Administered 2024-02-24: 10 mg via ORAL
  Filled 2024-02-23 (×2): qty 1

## 2024-02-23 MED ORDER — VITAMIN D 25 MCG (1000 UNIT) PO TABS
1000.0000 [IU] | ORAL_TABLET | Freq: Every day | ORAL | Status: DC
  Administered 2024-02-23 – 2024-02-27 (×5): 1000 [IU] via ORAL
  Filled 2024-02-23 (×5): qty 1

## 2024-02-23 MED ORDER — FUROSEMIDE 10 MG/ML IJ SOLN
20.0000 mg | Freq: Once | INTRAMUSCULAR | Status: AC
  Administered 2024-02-23: 20 mg via INTRAVENOUS
  Filled 2024-02-23: qty 4

## 2024-02-23 MED ORDER — SENNOSIDES-DOCUSATE SODIUM 8.6-50 MG PO TABS: 1.0000 | ORAL_TABLET | Freq: Every evening | ORAL | Status: DC | PRN

## 2024-02-23 NOTE — Progress Notes (Signed)
 PHARMACY - ANTICOAGULATION CONSULT NOTE  Pharmacy Consult for heparin Indication: chest pain/ACS  No Known Allergies  Patient Measurements: Weight: 63.5 kg (139 lb 15.9 oz)  Vital Signs: Temp: 97.6 F (36.4 C) (06/28 1855) Temp Source: Oral (06/28 1855) BP: 150/96 (06/28 2100) Pulse Rate: 66 (06/28 2100)  Labs: Recent Labs    02/23/24 1927  HGB 12.1  HCT 37.2  PLT 228  CREATININE 1.04*  TROPONINIHS 223*    Estimated Creatinine Clearance: 35 mL/min (A) (by C-G formula based on SCr of 1.04 mg/dL (H)).   Medical History: Past Medical History:  Diagnosis Date   Allergy    Arthritis    ASCUS (atypical squamous cells of undetermined significance) on Pap smear 2011x2   Negative high-risk HPV, normal Pap smears 2012/2013   Atrial fibrillation (HCC)    Atypical nevus 05/02/2007   right back-moderate    Cancer (HCC)    Cataract    Diverticulosis    GERD (gastroesophageal reflux disease)    Hiatal hernia    Hyperlipemia    Hypertension    IBS (irritable bowel syndrome)    Kidney disease    Lymphocytic colitis 2007   Neuropathy    SCC (squamous cell carcinoma) 01/19/2006   bridge of nose (CX35FU)   SCC (squamous cell carcinoma) 03/08/2017   right inner lower shin (CX35FU)   SCC (squamous cell carcinoma) x 2 02/24/2002   right bridge of nose (Cx35FU),right bridge of nose   Uterine prolapse    Pessary     Assessment: 83 yo female presents with chest pressure, on apixaban  for  Hgb 12.1, plts 228, trop 223 Goal of Therapy:  Heparin level 0.3-0.7 units/ml aPTT 66-102 seconds Monitor platelets by anticoagulation protocol: Yes   Plan:  Baseline labs ordered STAT No bolus, start heparin drip at 750 units/hr Will monitor with aPTT levels aPTT in  8 hours Daily CBC  Leeroy Mace RPh 02/23/2024, 10:05 PM

## 2024-02-23 NOTE — H&P (Addendum)
 " History and Physical  Kimberly Suarez  FMW:[id number] DOB: [birth year] DOA: 02/23/2024  PCP: Jolinda Norene HERO, DO   Chief Complaint: Shortness of breath, chest pain  HPI: Kimberly Suarez is a 83 y.o. female with medical history significant for congestive heart failure, HTN, HLD, A-fib on Eliquis , arthritis, IBS, skin cancer, neuropathy and GERD who presented to the ED for evaluation of shortness of breath and chest pain.  Patient reports that around 3 AM, she had an episode of chest pressure that resolved on its own. In the afternoon, she started having shortness of breath and dyspnea on exertion. She also endorsed increased leg swelling as well as orthopnea. Reports she was diagnosed with pneumonia a few weeks ago and continues to have a persistent dry cough with occasional production of clear sputum. She reports occasional chills but denies any fevers, chest pain, nausea, vomiting, abdominal pain, dizziness, palpitations or dysuria.  Reports she was given a breathing treatment by the paramedics with seem to help her breathing.  ED Course: Initial vitals show temp 97.6, RR 20, HR 70, BP 155/86, SpO2 97% on room air. Initial labs significant for sodium 128, K+ 4.0, creatinine 1.04, WBC 17.4, glucose 123 troponin 223. EKG shows sinus rhythm with PACs and nonspecific ST changes. CXR shows mild cardiomegaly and pulmonary interstitial edema. Pt received Lasix  20 mg x 1 and aspirin  324 mg x 1.  Cardiology was consulted for evaluation and recommended admission at [facility]. [facility] was consulted for admission.   Review of Systems: Please see HPI for pertinent positives and negatives. A complete 10 system review of systems are otherwise negative.  Past Medical History:  Diagnosis Date   Allergy    Arthritis    ASCUS (atypical squamous cells of undetermined significance) on Pap smear 2011x2   Negative high-risk HPV, normal Pap smears 2012/2013   Atrial fibrillation (HCC)    Atypical nevus 05/02/2007   right  back-moderate    Cancer (HCC)    Cataract    Diverticulosis    GERD (gastroesophageal reflux disease)    Hiatal hernia    Hyperlipemia    Hypertension    IBS (irritable bowel syndrome)    Kidney disease    Lymphocytic colitis 2007   Neuropathy    SCC (squamous cell carcinoma) 01/19/2006   bridge of nose (CX35FU)   SCC (squamous cell carcinoma) 03/08/2017   right inner lower shin (CX35FU)   SCC (squamous cell carcinoma) x 2 02/24/2002   right bridge of nose (Cx35FU),right bridge of nose   Uterine prolapse    Pessary   Past Surgical History:  Procedure Laterality Date   BREAST BIOPSY     unsure which breast   CATARACT EXTRACTION W/ INTRAOCULAR LENS IMPLANT Bilateral    COLONOSCOPY     Social History:  reports that she has never smoked. She has never used smokeless tobacco. She reports that she does not currently use alcohol. She reports that she does not use drugs.  No Known Allergies  Family History  Problem Relation Age of Onset   Heart disease Mother    COPD Father    Asthma Father    Alzheimer's disease Sister    Multiple sclerosis Sister    Heart disease Sister    Diabetes Brother        BKA   Alzheimer's disease Brother    Diabetes Brother    Early death Brother        died in war   Stomach cancer Paternal Aunt  Colon cancer Neg Hx    Kidney disease Neg Hx    Liver disease Neg Hx    Esophageal cancer Neg Hx    Rectal cancer Neg Hx      Prior to Admission medications   Medication Sig Start Date End Date Taking? Authorizing Provider  Alpha-Lipoic Acid 600 MG CAPS Take by mouth.   Yes [provider]  apixaban  (ELIQUIS ) 5 MG TABS tablet Take 1 tablet (5 mg total) by mouth 2 (two) times daily. For afib 01/08/24  Yes Gottschalk, Ashly M, DO  atorvastatin  (LIPITOR) 40 MG tablet TAKE 1 TABLET BY MOUTH DAILY 12/06/23  Yes Gottschalk, Ashly M, DO  enalapril  (VASOTEC ) 10 MG tablet TAKE 1 TABLET BY MOUTH TWICE  DAILY 12/06/23  Yes Jolinda Potter M, DO   fluticasone  (FLONASE ) 50 MCG/ACT nasal spray Place 1 spray into both nostrils 2 (two) times daily as needed for allergies or rhinitis. 07/17/19  Yes Hawks, Bari A, FNP  gabapentin  (NEURONTIN ) 100 MG capsule TAKE 3 CAPSULES BY MOUTH AT  BEDTIME 12/06/23  Yes Gottschalk, Ashly M, DO  Glucosamine-Chondroitin (GLUCOSAMINE CHONDR COMPLEX PO) Take 1 tablet by mouth 2 (two) times daily.   Yes [provider]  hydrochlorothiazide  (HYDRODIURIL ) 25 MG tablet TAKE 1 TABLET BY MOUTH DAILY Patient taking differently: as needed. 06/12/22  Yes Jolinda Potter M, DO  Melatonin 10 MG CAPS Take by mouth at bedtime and may repeat dose one time if needed.   Yes [provider]  metoprolol  succinate (TOPROL  XL) 25 MG 24 hr tablet Take 1 tablet (25 mg total) by mouth daily. 09/14/23  Yes Tobb, Kardie, DO  Omega-3 Fatty Acids (FISH OIL) 1000 MG CAPS Take 1 capsule by mouth daily.   Yes [provider]  saccharomyces boulardii (FLORASTOR) 250 MG capsule Take 250 mg by mouth 2 (two) times daily.   Yes [provider]  TURMERIC CURCUMIN PO Take 1,500 mg by mouth 2 (two) times daily.   Yes [provider]  VITAMIN D  PO Take by mouth.   Yes [provider]  acetaminophen  (TYLENOL ) 500 MG tablet Take 2 tablets (1,000 mg total) by mouth 3 (three) times daily. Patient taking differently: Take 1,000 mg by mouth every 6 (six) hours as needed for mild pain (pain score 1-3), moderate pain (pain score 4-6), fever or headache. 05/15/23   Zollie Lowers, MD  budesonide  (ENTOCORT EC ) 3 MG 24 hr capsule Take 9mg  daily for 4 weeks, take 6mg  for 4 weeks, take 3mg  for 4 weeks. GoodRx couponBETHA ALLY [id number], PCN [id number], Group [id number], Member ID [id number] Patient not taking: Reported on 02/23/2024 12/04/23   Mollie Nestor HERO, PA-C  famotidine  (PEPCID ) 20 MG tablet Take 1 tablet (20 mg total) by mouth every other day. ** PLEASE NOTE THIS IS ADJUSTED FOR KIDNEY FUNCTION Patient not taking: Reported  on 02/23/2024 06/26/23   Jolinda Potter HERO, DO  fluconazole  (DIFLUCAN ) 100 MG tablet Take two with first dose. Then starting the next day take one daily until all are taken. Patient not taking: Reported on 02/23/2024 12/27/23   Zollie Lowers, MD  Multiple Vitamins-Minerals (MULTIVITAMIN ADULT PO) Take 1 tablet by mouth daily.    [provider]  sulfamethoxazole -trimethoprim  (BACTRIM  DS) 800-160 MG tablet Take 1 tablet by mouth 2 (two) times daily. Patient not taking: Reported on 02/23/2024 12/27/23   Zollie Lowers, MD  vitamin B-12 (CYANOCOBALAMIN) 500 MCG tablet Take 500 mcg by mouth daily.    [provider]  Physical Exam: BP (!) 149/82   Pulse 92   Temp 98.7 F (37.1 C) (Oral)   Resp 20   Wt 63.5 kg   SpO2 92%   BMI 26.45 kg/m  General: Pleasant, well-appearing elderly woman laying in bed. No acute distress. HEENT: [place]/AT. Anicteric sclera CV: RRR. No murmurs, rubs, or gallops.  Trace BLE edema Pulmonary: Lungs CTAB. Normal effort. Bibasilar rales. No wheezing. Abdominal: Soft, nontender, nondistended. Normal bowel sounds. Extremities: Palpable radial and DP pulses. Normal ROM. Skin: Warm and dry. No obvious rash or lesions. Neuro: A&Ox3. Moves all extremities. Normal sensation to light touch. No focal deficit. Psych: Normal mood and affect          Labs on Admission:  Basic Metabolic Panel: Recent Labs  Lab 02/23/24 1927  NA 128*  K 4.0  CL 92*  CO2 24  GLUCOSE 132*  BUN 36*  CREATININE 1.04*  CALCIUM  9.1   Liver Function Tests: No results for input(s): AST, ALT, ALKPHOS, BILITOT, PROT, ALBUMIN in the last 168 hours. No results for input(s): LIPASE, AMYLASE in the last 168 hours. No results for input(s): AMMONIA in the last 168 hours. CBC: Recent Labs  Lab 02/23/24 1927  WBC 17.4*  HGB 12.1  HCT 37.2  MCV 94.2  PLT 228   Cardiac Enzymes: No results for input(s): CKTOTAL, CKMB, CKMBINDEX, TROPONINI in the last  168 hours. BNP (last 3 results) No results for input(s): BNP in the last 8760 hours.  ProBNP (last 3 results) No results for input(s): PROBNP in the last 8760 hours.  CBG: No results for input(s): GLUCAP in the last 168 hours.  Radiological Exams on Admission: DG Chest 2 View Result Date: 02/23/2024 CLINICAL DATA:  chest pain EXAM: CHEST - 2 VIEW COMPARISON:  November 07, 2017 FINDINGS: Lower lung volumes. Bilateral perihilar interstitial opacities with patchy opacities in both lung bases. Blunting of both costophrenic sulci. Mild cardiomegaly. Tortuous aorta with aortic atherosclerosis.No acute fracture or destructive lesion. IMPRESSION: 1. Mild cardiomegaly. Low lung volumes with changes of either interstitial edema, bronchovascular crowding, or atypical/viral infection. 2. Trace bilateral pleural effusions. Electronically Signed   By: Rogelia Myers M.D.   On: 02/23/2024 19:18   Assessment/Plan Kimberly Suarez is a 83 y.o. female with medical history significant for congestive heart failure, HTN, HLD, A-fib on Eliquis , arthritis, IBS, skin cancer, neuropathy and GERD who presented to the ED for evaluation of shortness of breath and admitted for acute congestive heart failure  # Acute congestive heart failure - Last TTE on 06/2023 shows EF 55 to 60%, G1DD but no valvular abnormalities - Pt presented with progressive shortness of breath and dyspnea on exertion - Pt with clinical, radiological and laboratory signs of CHF exacerbation - Patient on room air, no significant respiratory distress - Start IV lasix  40 mg twice daily - Continue enalapril  and Toprol  XL - Follow up repeat TTE - Strict I&O, daily weights - Maintain K+ > 4.0, Mag > 2.0 - Supplemental O2 as needed - Telemetry  # Elevated troponin # ?NSTEMI versus demand ischemia - Patient found to have elevated troponin from 223 and up trended to 337 - Patient with reported brief episode of chest pressure prior to arrival  but no chest pain on admission - EKG shows sinus rhythm with PACs and nonspecific ST and T wave changes - Differential includes NSTEMI versus demand ischemia from acute CHF - Cardiology following, recommends transferring to [facility] and holding off on initiating heparin  drip for now -  Continue to trend troponin until peaked  # Hyponatremia - Sodium of 128 on admission from normal baseline - Pt hypervolemic on exam - Likely secondary to hypervolemic hyponatremia in the setting of CHF exacerbation - Continue IV diuresing as above - Check urine sodium and urine osm - F/u repeat sodium  # A-fib - EKG shows sinus rhythm with PACs and nonspecific ST changes - HR mostly in the 60s to 70s - Continue on Toprol -XL - Hold Eliquis  per cardiology - Telemetry  # HLD - Continue atorvastatin   # Neuropathy - Continue gabapentin    DVT prophylaxis: SCDs    Code Status: Full Code  Consults called: Cardiology  Family Communication: Discussed admission with daughter at bedside  Severity of Illness: The appropriate patient status for this patient is INPATIENT. Inpatient status is judged to be reasonable and necessary in order to provide the required intensity of service to ensure the patient's safety. The patient's presenting symptoms, physical exam findings, and initial radiographic and laboratory data in the context of their chronic comorbidities is felt to place them at high risk for further clinical deterioration. Furthermore, it is not anticipated that the patient will be medically stable for discharge from the hospital within 2 midnights of admission.   * I certify that at the point of admission it is my clinical judgment that the patient will require inpatient hospital care spanning beyond 2 midnights from the point of admission due to high intensity of service, high risk for further deterioration and high frequency of surveillance required.*  Level of care: Telemetry Cardiac   This record has  been created using Dragon voice recognition software. Errors have been sought and corrected, but may not always be located. Such creation errors do not reflect on the standard of care.   Lou Claretta HERO, MD 02/23/2024, 11:09 PM [facility] Pager: [phone] Isaiah 41:10   If 7PM-7AM, please contact night-coverage [email or web address] Password [email or web address]  "

## 2024-02-23 NOTE — ED Triage Notes (Signed)
 Pt arrives via POV. PT reports around 0300 this morning, she had an episode of chest pressure that did not last long. She reports she had been experiencing sob today, until the paramedics came to her house and gave her a breathing treatment. Pt also reports an ongoing cough. Pt is AxOx4. She denies chest pain at this time.

## 2024-02-23 NOTE — ED Provider Notes (Signed)
 [place] EMERGENCY DEPARTMENT AT [facility] Provider Note  CSN: [id number] Arrival date & time: 02/23/24 1846  Chief Complaint(s) Chest Pain, Shortness of Breath, and Cough  HPI Kimberly Suarez is a 83 y.o. female history of atrial fibrillation, hypertension, hyperlipidemia, on Eliquis  presenting to the emergency department with episode of chest tightness.  Patient reports chest tightness around 3 AM, lasted briefly.  Is also had subsequently shortness of breath, dyspnea on exertion, orthopnea.  Reports chronic leg swelling that is worse than typical for her.  No back pain.  No ongoing chest pain.  No nausea or vomiting.  Has been having a dry cough.  Denies similar episode previously.  received a breathing treatment at the paramedics which seemed to help some.   Past Medical History Past Medical History:  Diagnosis Date   Allergy    Arthritis    ASCUS (atypical squamous cells of undetermined significance) on Pap smear 2011x2   Negative high-risk HPV, normal Pap smears 2012/2013   Atrial fibrillation (HCC)    Atypical nevus 05/02/2007   right back-moderate    Cancer (HCC)    Cataract    Diverticulosis    GERD (gastroesophageal reflux disease)    Hiatal hernia    Hyperlipemia    Hypertension    IBS (irritable bowel syndrome)    Kidney disease    Lymphocytic colitis 2007   Neuropathy    SCC (squamous cell carcinoma) 01/19/2006   bridge of nose (CX35FU)   SCC (squamous cell carcinoma) 03/08/2017   right inner lower shin (CX35FU)   SCC (squamous cell carcinoma) x 2 02/24/2002   right bridge of nose (Cx35FU),right bridge of nose   Uterine prolapse    Pessary   Patient Active Problem List   Diagnosis Date Noted   Acute congestive heart failure (HCC) 02/23/2024   Paroxysmal atrial fibrillation (HCC) 10/26/2023   Polyneuropathy associated with underlying disease (HCC) 04/26/2020   Gait abnormality 04/26/2020   Paresthesia 03/11/2020   Chronic bilateral low back  pain with right-sided sciatica 03/11/2020   Incontinence of feces 03/11/2020   Stage III chronic kidney disease (HCC) 10/31/2017   Pre-diabetes 06/01/2014   Osteoarthritis of left knee 11/18/2013   HTN (hypertension) 06/26/2013   Microscopic colitis 10/30/2012   Hyperlipidemia 10/30/2012   Dysphagia 07/10/2011   Diverticulosis of colon 07/10/2011   GERD with stricture 07/10/2011   Home Medication(s) Prior to Admission medications   Medication Sig Start Date End Date Taking? Authorizing Provider  Alpha-Lipoic Acid 600 MG CAPS Take by mouth.   Yes [provider]  apixaban  (ELIQUIS ) 5 MG TABS tablet Take 1 tablet (5 mg total) by mouth 2 (two) times daily. For afib 01/08/24  Yes Gottschalk, Ashly M, DO  atorvastatin  (LIPITOR) 40 MG tablet TAKE 1 TABLET BY MOUTH DAILY 12/06/23  Yes Jolinda Potter M, DO  enalapril  (VASOTEC ) 10 MG tablet TAKE 1 TABLET BY MOUTH TWICE  DAILY 12/06/23  Yes Gottschalk, Ashly M, DO  fluticasone  (FLONASE ) 50 MCG/ACT nasal spray Place 1 spray into both nostrils 2 (two) times daily as needed for allergies or rhinitis. 07/17/19  Yes Hawks, Bari A, FNP  gabapentin  (NEURONTIN ) 100 MG capsule TAKE 3 CAPSULES BY MOUTH AT  BEDTIME 12/06/23  Yes Gottschalk, Ashly M, DO  Glucosamine-Chondroitin (GLUCOSAMINE CHONDR COMPLEX PO) Take 1 tablet by mouth 2 (two) times daily.   Yes [provider]  hydrochlorothiazide  (HYDRODIURIL ) 25 MG tablet TAKE 1 TABLET BY MOUTH DAILY Patient taking differently: as needed. 06/12/22  Yes Jolinda Potter M, DO  Melatonin 10 MG CAPS Take by mouth at bedtime and may repeat dose one time if needed.   Yes [provider]  metoprolol  succinate (TOPROL  XL) 25 MG 24 hr tablet Take 1 tablet (25 mg total) by mouth daily. 09/14/23  Yes Tobb, Kardie, DO  Omega-3 Fatty Acids (FISH OIL) 1000 MG CAPS Take 1 capsule by mouth daily.   Yes [provider]  saccharomyces boulardii (FLORASTOR) 250 MG capsule Take 250 mg by mouth  2 (two) times daily.   Yes [provider]  TURMERIC CURCUMIN PO Take 1,500 mg by mouth 2 (two) times daily.   Yes [provider]  VITAMIN D  PO Take by mouth.   Yes [provider]  acetaminophen  (TYLENOL ) 500 MG tablet Take 2 tablets (1,000 mg total) by mouth 3 (three) times daily. Patient taking differently: Take 1,000 mg by mouth every 6 (six) hours as needed for mild pain (pain score 1-3), moderate pain (pain score 4-6), fever or headache. 05/15/23   Zollie Lowers, MD  budesonide  (ENTOCORT EC ) 3 MG 24 hr capsule Take 9mg  daily for 4 weeks, take 6mg  for 4 weeks, take 3mg  for 4 weeks. GoodRx couponBETHA ALLY [id number], PCN [id number], Group [id number], Member ID [id number] Patient not taking: Reported on 02/23/2024 12/04/23   Mollie Nestor HERO, PA-C  famotidine  (PEPCID ) 20 MG tablet Take 1 tablet (20 mg total) by mouth every other day. ** PLEASE NOTE THIS IS ADJUSTED FOR KIDNEY FUNCTION Patient not taking: Reported on 02/23/2024 06/26/23   Jolinda Potter HERO, DO  fluconazole  (DIFLUCAN ) 100 MG tablet Take two with first dose. Then starting the next day take one daily until all are taken. Patient not taking: Reported on 02/23/2024 12/27/23   Zollie Lowers, MD  Multiple Vitamins-Minerals (MULTIVITAMIN ADULT PO) Take 1 tablet by mouth daily.    [provider]  sulfamethoxazole -trimethoprim  (BACTRIM  DS) 800-160 MG tablet Take 1 tablet by mouth 2 (two) times daily. Patient not taking: Reported on 02/23/2024 12/27/23   Zollie Lowers, MD  vitamin B-12 (CYANOCOBALAMIN) 500 MCG tablet Take 500 mcg by mouth daily.    [provider]                                                                                                                                    Past Surgical History Past Surgical History:  Procedure Laterality Date   BREAST BIOPSY     unsure which breast   CATARACT EXTRACTION W/ INTRAOCULAR LENS IMPLANT Bilateral    COLONOSCOPY     Family History Family History   Problem Relation Age of Onset   Heart disease Mother    COPD Father    Asthma Father    Alzheimer's disease Sister    Multiple sclerosis Sister    Heart disease Sister    Diabetes Brother        BKA   Alzheimer's disease  Brother    Diabetes Brother    Early death Brother        died in war   Stomach cancer Paternal Aunt    Colon cancer Neg Hx    Kidney disease Neg Hx    Liver disease Neg Hx    Esophageal cancer Neg Hx    Rectal cancer Neg Hx     Social History Social History   Tobacco Use   Smoking status: Never   Smokeless tobacco: Never  Vaping Use   Vaping status: Never Used  Substance Use Topics   Alcohol use: Not Currently   Drug use: No   Allergies Patient has no known allergies.  Review of Systems Review of Systems  All other systems reviewed and are negative.   Physical Exam Vital Signs  I have reviewed the triage vital signs BP (!) 150/96   Pulse 66   Temp 97.6 F (36.4 C) (Oral)   Resp 20   Wt 63.5 kg   SpO2 95%   BMI 26.45 kg/m  Physical Exam Vitals and nursing note reviewed.  Constitutional:      General: She is not in acute distress.    Appearance: She is well-developed.  HENT:     Head: Normocephalic and atraumatic.     Mouth/Throat:     Mouth: Mucous membranes are moist.   Eyes:     Pupils: Pupils are equal, round, and reactive to light.   Neck:     Vascular: Hepatojugular reflux and JVD present.   Cardiovascular:     Rate and Rhythm: Normal rate and regular rhythm.     Heart sounds: No murmur heard. Pulmonary:     Effort: Pulmonary effort is normal. No respiratory distress.     Breath sounds: Examination of the right-middle field reveals rales. Examination of the left-middle field reveals rales. Examination of the right-lower field reveals rales. Examination of the left-lower field reveals rales. Rales present.  Abdominal:     General: Abdomen is flat.     Palpations: Abdomen is soft.     Tenderness: There is no abdominal  tenderness.   Musculoskeletal:        General: No tenderness.     Right lower leg: Edema present.     Left lower leg: Edema present.   Skin:    General: Skin is warm and dry.   Neurological:     General: No focal deficit present.     Mental Status: She is alert. Mental status is at baseline.   Psychiatric:        Mood and Affect: Mood normal.        Behavior: Behavior normal.     ED Results and Treatments Labs (all labs ordered are listed, but only abnormal results are displayed) Labs Reviewed  BASIC METABOLIC PANEL WITH GFR - Abnormal; Notable for the following components:      Result Value   Sodium 128 (*)    Chloride 92 (*)    Glucose, Bld 132 (*)    BUN 36 (*)    Creatinine, Ser 1.04 (*)    GFR, Estimated 53 (*)    All other components within normal limits  CBC - Abnormal; Notable for the following components:   WBC 17.4 (*)    All other components within normal limits  TROPONIN I (HIGH SENSITIVITY) - Abnormal; Notable for the following components:   Troponin I (High Sensitivity) 223 (*)    All other components within normal limits  APTT  HEPARIN LEVEL (UNFRACTIONATED)  BRAIN NATRIURETIC PEPTIDE  HEPATIC FUNCTION PANEL  MAGNESIUM  TSH  SODIUM, URINE, RANDOM  OSMOLALITY, URINE  TROPONIN I (HIGH SENSITIVITY)                                                                                                                          Radiology DG Chest 2 View Result Date: 02/23/2024 CLINICAL DATA:  chest pain EXAM: CHEST - 2 VIEW COMPARISON:  November 07, 2017 FINDINGS: Lower lung volumes. Bilateral perihilar interstitial opacities with patchy opacities in both lung bases. Blunting of both costophrenic sulci. Mild cardiomegaly. Tortuous aorta with aortic atherosclerosis.No acute fracture or destructive lesion. IMPRESSION: 1. Mild cardiomegaly. Low lung volumes with changes of either interstitial edema, bronchovascular crowding, or atypical/viral infection. 2. Trace  bilateral pleural effusions. Electronically Signed   By: Rogelia Myers M.D.   On: 02/23/2024 19:18    Pertinent labs & imaging results that were available during my care of the patient were reviewed by me and considered in my medical decision making (see MDM for details).  Medications Ordered in ED Medications  aspirin chewable tablet 324 mg (has no administration in time range)  furosemide (LASIX) injection 20 mg (20 mg Intravenous Given 02/23/24 2107)                                                                                                                                     Procedures .Critical Care  Performed by: Francesca Elsie CROME, MD Authorized by: Francesca Elsie CROME, MD   Critical care provider statement:    Critical care time (minutes):  30   Critical care was necessary to treat or prevent imminent or life-threatening deterioration of the following conditions:  Cardiac failure   Critical care was time spent personally by me on the following activities:  Development of treatment plan with patient or surrogate, discussions with consultants, evaluation of patient's response to treatment, examination of patient, ordering and review of laboratory studies, ordering and review of radiographic studies, ordering and performing treatments and interventions, pulse oximetry, re-evaluation of patient's condition and review of old charts   Care discussed with: admitting provider     (including critical care time)  Medical Decision Making / ED Course   MDM:  83 year old presenting to the emergency department with chest pain.  Patient well-appearing, no chest pain at this time.  EKG shows no STEMI.  Exam with bibasilar crackles, JVD and lower extremity swelling.  Suspect acute CHF.  Given episode of chest pain, concern for underlying cardiac cause such as ACS.  No ongoing chest pain currently.  Troponin is elevated to 23.  Discussed with cardiology, Dr. Donnel, recommends  admission, recommends patient be admitted to [facility] for further management.  Agrees with aspirin and heparin drip.  Considered other causes of shortness of breath and chest pain such as pneumonia, has been having cough but no fevers, does have leukocytosis but seems more consistent with CHF given troponin elevation and physical exam findings.  No pneumothorax on chest x-ray.  Labs with hyponatremia likely due to volume overload.  Considered dissection but no ongoing chest pain, episode chest pains were brief and ultimately pretty mild.  Discussed with hospitalist Dr. Placido who will admit patient.        Additional history obtained: -Additional history obtained from family -External records from outside source obtained and reviewed including: Chart review including previous notes, labs, imaging, consultation notes including prior notes   Lab Tests: -I ordered, reviewed, and interpreted labs.   The pertinent results include:   Labs Reviewed  BASIC METABOLIC PANEL WITH GFR - Abnormal; Notable for the following components:      Result Value   Sodium 128 (*)    Chloride 92 (*)    Glucose, Bld 132 (*)    BUN 36 (*)    Creatinine, Ser 1.04 (*)    GFR, Estimated 53 (*)    All other components within normal limits  CBC - Abnormal; Notable for the following components:   WBC 17.4 (*)    All other components within normal limits  TROPONIN I (HIGH SENSITIVITY) - Abnormal; Notable for the following components:   Troponin I (High Sensitivity) 223 (*)    All other components within normal limits  APTT  HEPARIN LEVEL (UNFRACTIONATED)  BRAIN NATRIURETIC PEPTIDE  HEPATIC FUNCTION PANEL  MAGNESIUM  TSH  SODIUM, URINE, RANDOM  OSMOLALITY, URINE  TROPONIN I (HIGH SENSITIVITY)    Notable for elevated troponin  EKG   EKG Interpretation Date/Time:  Saturday February 23 2024 18:57:47 EDT Ventricular Rate:  70 PR Interval:  163 QRS Duration:  96 QT Interval:  404 QTC  Calculation: 436 R Axis:   2  Text Interpretation: Sinus rhythm Atrial premature complex Consider anterior infarct Confirmed by Francesca Fallow ([id number]) on 02/23/2024 8:29:49 PM         Imaging Studies ordered: I ordered imaging studies including CXR On my interpretation imaging demonstrates pulmonary edema  I independently visualized and interpreted imaging. I agree with the radiologist interpretation   Medicines ordered and prescription drug management: Meds ordered this encounter  Medications   furosemide (LASIX) injection 20 mg   aspirin chewable tablet 324 mg    -I have reviewed the patients home medicines and have made adjustments as needed  Cardiac Monitoring: The patient was maintained on a cardiac monitor.  I personally viewed and interpreted the cardiac monitored which showed an underlying rhythm of: sinus rhythm   Reevaluation: After the interventions noted above, I reevaluated the patient and found that their symptoms have improved  Co morbidities that complicate the patient evaluation  Past Medical History:  Diagnosis Date   Allergy    Arthritis    ASCUS (atypical squamous cells of undetermined significance) on Pap smear 2011x2   Negative high-risk HPV, normal Pap smears 2012/2013   Atrial fibrillation (HCC)    Atypical nevus 05/02/2007  right back-moderate    Cancer (HCC)    Cataract    Diverticulosis    GERD (gastroesophageal reflux disease)    Hiatal hernia    Hyperlipemia    Hypertension    IBS (irritable bowel syndrome)    Kidney disease    Lymphocytic colitis 2007   Neuropathy    SCC (squamous cell carcinoma) 01/19/2006   bridge of nose (CX35FU)   SCC (squamous cell carcinoma) 03/08/2017   right inner lower shin (CX35FU)   SCC (squamous cell carcinoma) x 2 02/24/2002   right bridge of nose (Cx35FU),right bridge of nose   Uterine prolapse    Pessary      Dispostion: Disposition decision including need for hospitalization was  considered, and patient admitted to the hospital.    Final Clinical Impression(s) / ED Diagnoses Final diagnoses:  Acute congestive heart failure, unspecified heart failure type [facility])  NSTEMI (non-ST elevated myocardial infarction) [facility])     This chart was dictated using voice recognition software.  Despite best efforts to proofread,  errors can occur which can change the documentation meaning.    Francesca Elsie CROME, MD 02/23/24 2245

## 2024-02-23 NOTE — Consult Note (Incomplete)
 Cardiology Consultation   Patient ID: KINJAL NEITZKE MRN: [id number]; DOB: March 08, 1941  Admit date: 02/23/2024 Date of Consult: 02/24/2024  PCP:  Jolinda Norene HERO, DO   [facility] Providers Cardiologist:  Lynwood Schilling, MD        Patient Profile: Kimberly Suarez is a 83 y.o. female with a hx of HTN, HLD, Stage III CKD, carotid artery stenosis (dopplers in 12/2023 showed 1-39% RICA and 40-59% LICA stenoses) and paroxysmal atrial fibrillation who is being seen 02/24/2024 for the evaluation of dyspnea and decompensated heart failure at the request of Dr. Francesca.  History of Present Illness:  She was last examined by Dr. Schilling in 11/2023 and reported occasional palpitations but symptoms would improve with taking a deep breath or sitting up. No changes were made to her cardiac medications and she was continued on Eliquis  5 mg twice daily, Enalapril  10 mg twice daily and Toprol -XL 25 mg daily.  She presented to [facility] ED yesterday evening for evaluation of chest pressure and shortness of breath. Initial labs showed WBC 17.4, Hgb 12.1, platelets 228, Na+ 128, K+ 4.0 and creatinine 1.04.  AST 88 and ALT 136. Magnesium 1.9. TSH 2.766. BNP elevated at 1329. Initial and repeat Hs Troponin values at 223, 337 and 321. CXR showed mild cardiomegaly and low lung volumes with changes of either interstitial edema, bronchovascular crowding or atypical/viral infection.  Was also noted to have trace bilateral pleural effusions. EKG shows normal sinus rhythm, rate 70 with PAC's with no acute ST changes.  She received 20 mg IV Lasix while in the ED and has been started on 40 mg twice daily. Recorded net output of -2.3 L thus far.  Echocardiogram pending for later today but most recent echo in 06/2023 showed a preserved EF of 55 to 60% with grade 1 diastolic dysfunction and normal RV function.  In talk with the patient today, she reports she tries to be active at baseline and goes to a  local exercise class a few days a week but activity is overall limited due to knee pain. She denies any recent chest pain or dyspnea on exertion when doing routine activities such as at her exercise class or walking around the grocery store. Two nights ago, she developed chest pressure which she felt was due to acid reflux that she took Rolaids and it improved. Yesterday, she reports having significant shortness of breath and fatigue which would occur with minimal activity such as walking from room to room in her home. She does report having orthopnea along with worsening lower extremity edema. Says that she does not add salt to her food routinely. Reports good compliance with her medical therapy. Says that her heart rate is sometimes in the 40's to 50's at night but no associated lightheadedness, dizziness or presyncope. At the time of this encounter, she is in atrial fibrillation with RVR with heart rate in the 110's to 140's and asymptomatic.   Past Medical History:  Diagnosis Date   Allergy    Arthritis    ASCUS (atypical squamous cells of undetermined significance) on Pap smear 2011x2   Negative high-risk HPV, normal Pap smears 2012/2013   Atrial fibrillation (HCC)    Atypical nevus 05/02/2007   right back-moderate    Cancer (HCC)    Cataract    Diverticulosis    GERD (gastroesophageal reflux disease)    Hiatal hernia    Hyperlipemia    Hypertension    IBS (irritable bowel syndrome)  Kidney disease    Lymphocytic colitis 2007   Neuropathy    SCC (squamous cell carcinoma) 01/19/2006   bridge of nose (CX35FU)   SCC (squamous cell carcinoma) 03/08/2017   right inner lower shin (CX35FU)   SCC (squamous cell carcinoma) x 2 02/24/2002   right bridge of nose (Cx35FU),right bridge of nose   Uterine prolapse    Pessary    Past Surgical History:  Procedure Laterality Date   BREAST BIOPSY     unsure which breast   CATARACT EXTRACTION W/ INTRAOCULAR LENS IMPLANT Bilateral     COLONOSCOPY       Home Medications:  Prior to Admission medications   Medication Sig Start Date End Date Taking? Authorizing Provider  acetaminophen  (TYLENOL ) 500 MG tablet Take 2 tablets (1,000 mg total) by mouth 3 (three) times daily. 05/15/23   Zollie Lowers, MD  Alpha-Lipoic Acid 600 MG CAPS Take by mouth.    [provider]  apixaban  (ELIQUIS ) 5 MG TABS tablet Take 1 tablet (5 mg total) by mouth 2 (two) times daily. For afib 01/08/24   Jolinda Potter M, DO  atorvastatin  (LIPITOR) 40 MG tablet TAKE 1 TABLET BY MOUTH DAILY 12/06/23   Jolinda Potter M, DO  budesonide  (ENTOCORT EC ) 3 MG 24 hr capsule Take 9mg  daily for 4 weeks, take 6mg  for 4 weeks, take 3mg  for 4 weeks. GoodRx couponBETHA ALLY [id number], PCN [id number], Group [id number], Member ID [id number] 12/04/23   Mollie Nestor HERO, PA-C  enalapril  (VASOTEC ) 10 MG tablet TAKE 1 TABLET BY MOUTH TWICE  DAILY 12/06/23   Jolinda Potter M, DO  famotidine  (PEPCID ) 20 MG tablet Take 1 tablet (20 mg total) by mouth every other day. ** PLEASE NOTE THIS IS ADJUSTED FOR KIDNEY FUNCTION 06/26/23   Jolinda Potter M, DO  fluconazole  (DIFLUCAN ) 100 MG tablet Take two with first dose. Then starting the next day take one daily until all are taken. 12/27/23   Zollie Lowers, MD  fluticasone  (FLONASE ) 50 MCG/ACT nasal spray Place 1 spray into both nostrils 2 (two) times daily as needed for allergies or rhinitis. 07/17/19   Lavell Bari LABOR, FNP  gabapentin  (NEURONTIN ) 100 MG capsule TAKE 3 CAPSULES BY MOUTH AT  BEDTIME 12/06/23   Jolinda Potter M, DO  Glucosamine-Chondroitin (GLUCOSAMINE CHONDR COMPLEX PO) Take 1 tablet by mouth 2 (two) times daily.    [provider]  hydrochlorothiazide  (HYDRODIURIL ) 25 MG tablet TAKE 1 TABLET BY MOUTH DAILY Patient taking differently: as needed. 06/12/22   Jolinda Potter HERO, DO  Melatonin 10 MG CAPS Take by mouth at bedtime and may repeat dose one time if needed.    [provider]  metoprolol  succinate  (TOPROL  XL) 25 MG 24 hr tablet Take 1 tablet (25 mg total) by mouth daily. 09/14/23   Tobb, Kardie, DO  Multiple Vitamins-Minerals (MULTIVITAMIN ADULT PO) Take 1 tablet by mouth daily.    [provider]  Omega-3 Fatty Acids (FISH OIL) 1000 MG CAPS Take 1 capsule by mouth daily.    [provider]  saccharomyces boulardii (FLORASTOR) 250 MG capsule Take 250 mg by mouth 2 (two) times daily.    [provider]  sulfamethoxazole -trimethoprim  (BACTRIM  DS) 800-160 MG tablet Take 1 tablet by mouth 2 (two) times daily. 12/27/23   Zollie Lowers, MD  TURMERIC CURCUMIN PO Take 1,500 mg by mouth 2 (two) times daily.    [provider]  vitamin B-12 (CYANOCOBALAMIN) 500 MCG tablet Take 500 mcg by mouth daily.  [provider]  VITAMIN D  PO Take by mouth.    [provider]    Scheduled Meds:  atorvastatin   40 mg Oral QHS   cholecalciferol  1,000 Units Oral QHS   enalapril   10 mg Oral BID   furosemide  40 mg Intravenous BID   gabapentin   300 mg Oral QHS   metoprolol  succinate  25 mg Oral Daily   Continuous Infusions:  PRN Meds: acetaminophen  **OR** acetaminophen , ondansetron  **OR** ondansetron  (ZOFRAN ) IV, senna-docusate  Allergies:   No Known Allergies  Social History:   Social History   Socioeconomic History   Marital status: Married    Spouse name: Not on file   Number of children: 3    Years of education: Not on file   Highest education level: 8th grade  Occupational History   Occupation: Retired   Tobacco Use   Smoking status: Never   Smokeless tobacco: Never  Vaping Use   Vaping status: Never Used  Substance and Sexual Activity   Alcohol use: Not Currently   Drug use: No   Sexual activity: Not Currently    Birth control/protection: Post-menopausal    Comment: 1st intercourse 15 yo--1 partner  Other Topics Concern   Not on file  Social History Narrative   Lives at home with husband.   Right-handed.   Caffeine use: one  cup caffeine some days.   Social Drivers of Corporate investment banker Strain: Low Risk  (10/09/2018)   Overall Financial Resource Strain (CARDIA)    Difficulty of Paying Living Expenses: Not hard at all  Food Insecurity: No Food Insecurity (01/29/2024)   Hunger Vital Sign    Worried About Running Out of Food in the Last Year: Never true    Ran Out of Food in the Last Year: Never true  Transportation Needs: No Transportation Needs (01/29/2024)   PRAPARE - Administrator, Civil Service (Medical): No    Lack of Transportation (Non-Medical): No  Physical Activity: Insufficiently Active (10/09/2018)   Exercise Vital Sign    Days of Exercise per Week: 2 days    Minutes of Exercise per Session: 30 min  Stress: No Stress Concern Present (10/09/2018)   [employer] of Occupational Health - Occupational Stress Questionnaire    Feeling of Stress : Not at all  Social Connections: Moderately Integrated (10/09/2018)   Social Connection and Isolation Panel    Frequency of Communication with Friends and Family: More than three times a week    Frequency of Social Gatherings with Friends and Family: More than three times a week    Attends Religious Services: More than 4 times per year    Active Member of [employer] or Organizations: No    Attends Banker Meetings: Never    Marital Status: Married  Catering manager Violence: Not At Risk (01/29/2024)   Humiliation, Afraid, Rape, and Kick questionnaire    Fear of Current or Ex-Partner: No    Emotionally Abused: No    Physically Abused: No    Sexually Abused: No    Family History:    Family History  Problem Relation Age of Onset   Heart disease Mother    COPD Father    Asthma Father    Alzheimer's disease Sister    Multiple sclerosis Sister    Heart disease Sister    Diabetes Brother        BKA   Alzheimer's disease Brother    Diabetes Brother  Early death Brother        died in war   Stomach cancer Paternal Aunt     Colon cancer Neg Hx    Kidney disease Neg Hx    Liver disease Neg Hx    Esophageal cancer Neg Hx    Rectal cancer Neg Hx      ROS:  Please see the history of present illness.   All other ROS reviewed and negative.     Physical Exam/Data: Vitals:   02/24/24 0400 02/24/24 0613 02/24/24 0730 02/24/24 0900  BP: 127/71 114/63 (!) 118/94 121/68  Pulse: (!) 57 (!) 55 68 93  Resp: 18 18 20  (!) 22  Temp:  97.6 F (36.4 C)    TempSrc:  Oral    SpO2: 94% 95% 94% 95%  Weight:      Height:        Intake/Output Summary (Last 24 hours) at 02/24/2024 1045 Last data filed at 02/24/2024 0237 Gross per 24 hour  Intake --  Output 1800 ml  Net -1800 ml      02/23/2024   11:12 PM 02/23/2024   10:00 PM 01/29/2024   11:21 AM  Last 3 Weights  Weight (lbs) 140 lb 139 lb 15.9 oz 140 lb  Weight (kg) 63.504 kg 63.5 kg 63.504 kg     Body mass index is 24.03 kg/m.  General: Pleasant, elderly female appearing in no acute distress. HEENT: normal Neck: No carotid bruits.  Vascular: No carotid bruits; Distal pulses 2+ bilaterally Cardiac:  normal S1, S2; irregular irregular Lungs: Rales along right base.  No wheezing. Abd: soft, nontender, no hepatomegaly  Ext: Trace lower extremity edema Musculoskeletal:  No deformities, BUE and BLE strength normal and equal Skin: warm and dry  Neuro:  CNs 2-12 intact, no focal abnormalities noted Psych:  Normal affect    EKG: Personally reviewed and shows normal sinus rhythm, rate 70 with PAC's with no acute ST changes.  Cardiac Studies:   Echocardiogram: 06/2023 IMPRESSIONS     1. Left ventricular ejection fraction, by estimation, is 55 to 60%. The  left ventricle has normal function. The left ventricle has no regional  wall motion abnormalities. Left ventricular diastolic parameters are  consistent with Grade I diastolic  dysfunction (impaired relaxation).   2. Right ventricular systolic function is normal. The right ventricular  size is normal.  There is normal pulmonary artery systolic pressure.   3. No evidence of mitral valve regurgitation.   4. The aortic valve is grossly normal. Aortic valve regurgitation is not  visualized.   5. The inferior vena cava is normal in size with greater than 50%  respiratory variability, suggesting right atrial pressure of 3 mmHg.   Event Monitor: 07/2023 Patch Wear Time:  13 days and 16 hours (2024-11-28T20:23:58-0500 to 2024-12-12T12:47:16-0500)   Patient had a min HR of 43 bpm, max HR of 187 bpm, and avg HR of 58 bpm. Predominant underlying rhythm was Sinus Rhythm.    Atrial Fibrillation occurred (2% burden), ranging from 69-187 bpm (avg of 124 bpm), the longest lasting 2 hours 45 mins with an avg rate of 132 bpm. Atrial Fibrillation was detected within +/- 45 seconds of symptomatic patient event(s). Isolated SVEs were rare (<1.0%), SVE Couplets were rare (<1.0%), and SVE Triplets were rare (<1.0%). Isolated VEs were rare (<1.0%, 1919), VE Couplets were rare (<1.0%, 9), and VE Triplets were rare (<1.0%, 1). Ventricular Trigeminy was present.    Symptoms associated with paroxysmal atrial fibrillation.  Laboratory Data: High Sensitivity Troponin:   Recent Labs  Lab 02/23/24 1927 02/23/24 2242 02/24/24 0221  TROPONINIHS 223* 337* 321*     Chemistry Recent Labs  Lab 02/23/24 1927 02/23/24 2242  NA 128*  --   K 4.0  --   CL 92*  --   CO2 24  --   GLUCOSE 132*  --   BUN 36*  --   CREATININE 1.04*  --   CALCIUM  9.1  --   MG  --  1.9  GFRNONAA 53*  --   ANIONGAP 12  --     Recent Labs  Lab 02/23/24 2242  PROT 7.7  ALBUMIN 3.9  AST 88*  ALT 136*  ALKPHOS 46  BILITOT 1.1   Lipids No results for input(s): CHOL, TRIG, HDL, LABVLDL, LDLCALC, CHOLHDL in the last 168 hours.  Hematology Recent Labs  Lab 02/23/24 1927  WBC 17.4*  RBC 3.95  HGB 12.1  HCT 37.2  MCV 94.2  MCH 30.6  MCHC 32.5  RDW 13.8  PLT 228   Thyroid   Recent Labs  Lab 02/23/24 2242   TSH 2.766    BNP Recent Labs  Lab 02/23/24 2242  BNP 1,329.6*    DDimer No results for input(s): DDIMER in the last 168 hours.  Radiology/Studies:  DG Chest 2 View Result Date: 02/23/2024 CLINICAL DATA:  chest pain EXAM: CHEST - 2 VIEW COMPARISON:  November 07, 2017 FINDINGS: Lower lung volumes. Bilateral perihilar interstitial opacities with patchy opacities in both lung bases. Blunting of both costophrenic sulci. Mild cardiomegaly. Tortuous aorta with aortic atherosclerosis.No acute fracture or destructive lesion. IMPRESSION: 1. Mild cardiomegaly. Low lung volumes with changes of either interstitial edema, bronchovascular crowding, or atypical/viral infection. 2. Trace bilateral pleural effusions. Electronically Signed   By: Rogelia Myers M.D.   On: 02/23/2024 19:18     Assessment and Plan:  1.  Acute CHF (echocardiogram pending to determine HFrEF versus HFpEF) - Reports worsening dyspnea on exertion over the past few days along with episodic orthopnea and worsening lower extremity edema. BNP elevated at 1329 and CXR showed bilateral pleural effusions. Echocardiogram pending but would be concerned for a tachy-mediated cardiomyopathy due to atrial fibrillation with RVR.  - She has received a total of 60 mg IV Lasix thus far and is now on 40 mg twice daily. Will continue to follow I's and O's along with daily weights. She has already diuresed 2.3 L and reports significant proved in symptoms. Based off echocardiogram results, will likely further adjust medical therapy. May ultimately be a candidate for SGLT2 inhibitor but would hold off for now in case she requires procedures this admission.  2. Elevated Troponin Values - Hs troponin values have been elevated, peaking at 337. Reports 1 episode of chest pressure as discussed above which occurred at night and improved with Rolaids. No recurrent symptoms since. An echocardiogram is pending to assess for any structural abnormalities. - Suspect  enzyme elevation is secondary to demand ischemia in the setting of atrial fibrillation with RVR and her acute CHF exacerbation. If EF significantly reduced, would need to consider further ischemic evaluation this admission. As discussed below, will review with Dr. Alvan in regards to starting IV Heparin. Remains on Atorvastatin  40 mg daily and Toprol -XL 25 mg daily. Not on ASA given the need for Eliquis .  3.  Atrial fibrillation with RVR - She has a history of paroxysmal atrial fibrillation and prior monitor in 07/2023 showed a 2% burden but average heart  rate was in the 130's when in atrial fibrillation. As discussed above, she is asymptomatic at this time while in atrial fibrillation with heart rate in the 120's to 140's. - She has been continued on Toprol -XL 25 mg daily but has not yet received her morning dose. Pending at this time. Given that she reports her heart rate is in the 40's to 50's at times, suspect we will be limited in titration of this. - If rates remain poorly controlled, would plan to start IV Amiodarone given her soft BP with SBP in the low 100's. Will stop Enalapril . - Eliquis  is currently held in case she requires invasive procedures. Will review with Dr. Alvan in regards to restarting Eliquis  or bridging with IV Heparin.   4. Carotid Artery Stenosis - Dopplers in 12/2023 showing 1-39% RICA and 40-59% LICA stenoses. She has been continued on Atorvastatin  40 mg daily.  Not on ASA given the need for anticoagulation.  5. Hyponatremia - Na+ was at 128 on admission. Continue to follow with diuresis.   Risk Assessment/Risk Scores:       New York  Heart Association (NYHA) Functional Class NYHA Class III  CHA2DS2-VASc Score = 5   This indicates a 7.2% annual risk of stroke. The patient's score is based upon: CHF History: 0 HTN History: 1 Diabetes History: 0 Stroke History: 0 Vascular Disease History: 1 Age Score: 2 Gender Score: 1      For questions or updates,  please contact [facility] Please consult [email or web address] for contact info under    Signed, Laymon CHRISTELLA Qua, PA-C  02/24/2024 10:45 AM   Attending Note  Patient seen and discussed with PA Qua, I agree with her documentation. 83 yo female history of PAF, CKD 3, HTN, admitted with SOB and chest pain   Na 128 BUN 36 Cr 1.04 K 4 WBC 17.4 Plt 228 BNP 1329 Mg 1.9 TSH 2.7  Trop 223-->337-->321 EKG SR, no acute ischemic changes CXR edema vs atypical infection 06/2023 echo: LVEF 55-60%, no WMAs, grade I dd, normal RV    1.Acute HF - presented with SOB, LE edema, orthopnea.  - unknown type, echo is pending - CXR edema vs atypical infection, BNP 1329 -received IV lasix 20mg  yesterday, due for 40mg  bid today. Early limited I/Os thus far.  - certainly her frequent paroxsyms of afib with RVR could be playing a role.   2. Elevated troponin -episodes of chest of chest pain prior to admission - peak trop 337, trending down. EKG without acute ischemic changes - echo is pending.  - unclear if trop related to HF or possibly ACS. She has also had severel episodes afib with RVR this admission that is asymptomatic and could be related to trop elevation - F/u echo results to decide on ischemic testing.    3.Hyponatremia - hypervolemic hyponatremia - follow Na with diuresis  4. PAF - eliquis  on hold in case requires invasive testing, started on heparin - has been very paroxysmal this admission, SR by initial EKG. In and out of afib this morning, when afib rates to 150s. She is asymptomatic when in.  - her SR can be in the mid 50s, she is on toprol  25mg  daily I don't think she will tolerate titration of beta blocker due to low sinus rates -suspect her frequent paroxysms of fast afib are playing a role in her new clinical diagnosis of heart failure - will start IV amiodarone to better control afib   Dorn Alvan MD

## 2024-02-24 ENCOUNTER — Inpatient Hospital Stay (HOSPITAL_COMMUNITY)

## 2024-02-24 DIAGNOSIS — I509 Heart failure, unspecified: Secondary | ICD-10-CM | POA: Diagnosis not present

## 2024-02-24 DIAGNOSIS — R7989 Other specified abnormal findings of blood chemistry: Secondary | ICD-10-CM | POA: Diagnosis not present

## 2024-02-24 DIAGNOSIS — I214 Non-ST elevation (NSTEMI) myocardial infarction: Secondary | ICD-10-CM | POA: Diagnosis not present

## 2024-02-24 DIAGNOSIS — I5031 Acute diastolic (congestive) heart failure: Secondary | ICD-10-CM

## 2024-02-24 DIAGNOSIS — E871 Hypo-osmolality and hyponatremia: Secondary | ICD-10-CM

## 2024-02-24 DIAGNOSIS — I4891 Unspecified atrial fibrillation: Secondary | ICD-10-CM

## 2024-02-24 HISTORY — DX: Non-ST elevation (NSTEMI) myocardial infarction: I21.4

## 2024-02-24 LAB — TROPONIN I (HIGH SENSITIVITY): Troponin I (High Sensitivity): 321 ng/L

## 2024-02-24 LAB — BRAIN NATRIURETIC PEPTIDE: B Natriuretic Peptide: 1329.6 pg/mL — ABNORMAL HIGH (ref 0.0–100.0)

## 2024-02-24 LAB — OSMOLALITY, URINE: Osmolality, Ur: 535 mosm/kg (ref 300–900)

## 2024-02-24 LAB — URINALYSIS, ROUTINE W REFLEX MICROSCOPIC
Bacteria, UA: NONE SEEN
Bilirubin Urine: NEGATIVE
Glucose, UA: NEGATIVE mg/dL
Hgb urine dipstick: NEGATIVE
Ketones, ur: NEGATIVE mg/dL
Nitrite: NEGATIVE
Protein, ur: NEGATIVE mg/dL
Specific Gravity, Urine: 1.013 (ref 1.005–1.030)
pH: 5 (ref 5.0–8.0)

## 2024-02-24 LAB — APTT: aPTT: 45 s — ABNORMAL HIGH (ref 24–36)

## 2024-02-24 LAB — HEPARIN LEVEL (UNFRACTIONATED): Heparin Unfractionated: 1.08 [IU]/mL — ABNORMAL HIGH (ref 0.30–0.70)

## 2024-02-24 MED ORDER — HEPARIN (PORCINE) 25000 UT/250ML-% IV SOLN
1100.0000 [IU]/h | INTRAVENOUS | Status: DC
  Administered 2024-02-24: 900 [IU]/h via INTRAVENOUS
  Administered 2024-02-25: 1100 [IU]/h via INTRAVENOUS
  Filled 2024-02-24 (×2): qty 250

## 2024-02-24 MED ORDER — AMIODARONE LOAD VIA INFUSION
150.0000 mg | Freq: Once | INTRAVENOUS | Status: AC
  Administered 2024-02-24: 150 mg via INTRAVENOUS
  Filled 2024-02-24: qty 83.34

## 2024-02-24 MED ORDER — ORAL CARE MOUTH RINSE: 15.0000 mL | OROMUCOSAL | Status: DC | PRN

## 2024-02-24 MED ORDER — AMIODARONE HCL IN DEXTROSE 360-4.14 MG/200ML-% IV SOLN
60.0000 mg/h | INTRAVENOUS | Status: AC
  Administered 2024-02-24 (×2): 60 mg/h via INTRAVENOUS
  Filled 2024-02-24 (×2): qty 200

## 2024-02-24 MED ORDER — AMIODARONE HCL IN DEXTROSE 360-4.14 MG/200ML-% IV SOLN
30.0000 mg/h | INTRAVENOUS | Status: DC
  Administered 2024-02-24 – 2024-02-25 (×2): 30 mg/h via INTRAVENOUS
  Filled 2024-02-24: qty 200

## 2024-02-24 MED ORDER — FUROSEMIDE 10 MG/ML IJ SOLN
40.0000 mg | Freq: Two times a day (BID) | INTRAMUSCULAR | Status: DC
  Administered 2024-02-24 – 2024-02-25 (×3): 40 mg via INTRAVENOUS
  Filled 2024-02-24 (×3): qty 4

## 2024-02-24 NOTE — ED Notes (Signed)
 Report given to Grayce, RN @ [facility]

## 2024-02-24 NOTE — Progress Notes (Signed)
 PHARMACY - ANTICOAGULATION  Pharmacy Consult for heparin Indication: chest pain/ACS Brief A/P: aPTT subtherapeutic Increase Heparin rate  No Known Allergies  Patient Measurements: Height: 5' 4 (162.6 cm) Weight: 64.2 kg (141 lb 8.6 oz) IBW/kg (Calculated) : 54.7 HEPARIN DW (KG): 64.2  Vital Signs: Temp: 98.2 F (36.8 C) (06/29 1949) Temp Source: Oral (06/29 1949) BP: 132/65 (06/29 1949) Pulse Rate: 54 (06/29 1949)  Labs: Recent Labs    02/23/24 1927 02/23/24 2242 02/24/24 0221 02/24/24 2229  HGB 12.1  --   --   --   HCT 37.2  --   --   --   PLT 228  --   --   --   APTT  --  32  --  45*  HEPARINUNFRC  --  >1.10*  --   --   CREATININE 1.04*  --   --   --   TROPONINIHS 223* 337* 321*  --     Estimated Creatinine Clearance: 35.4 mL/min (A) (by C-G formula based on SCr of 1.04 mg/dL (H)).  Assessment: 83 y.o. female admitted with chest pain, h/o Afib and Eliquis  on hold, for heparin  Goal of Therapy:  Heparin level 0.3-0.7 units/ml aPTT 66-102 seconds Monitor platelets by anticoagulation protocol: Yes   Plan:  Increase Heparin 1100 units/hr Check aPTT in 8 hours  Cathlyn Arrant, PharmD, BCPS .

## 2024-02-24 NOTE — Progress Notes (Signed)
 PROGRESS NOTE    Kimberly Suarez  FMW:[id number] DOB: October 22, 1940 DOA: 02/23/2024 PCP: Jolinda Norene HERO, DO   Brief Narrative:  Kimberly Suarez is a 83 y.o. female with medical history significant for congestive heart failure, HTN, HLD, A-fib on Eliquis , arthritis, IBS, skin cancer, neuropathy and GERD who presented to the ED for evaluation of shortness of breath and chest pain.   Assessment & Plan:   Principal Problem:   Acute congestive heart failure (HCC) Active Problems:   NSTEMI (non-ST elevated myocardial infarction) (HCC)   Elevated troponin   Hyponatremia  Acute diastolic heart failure exacerbation - Last TTE on 06/2023 shows EF 55 to 60%, G1DD but no valvular abnormalities - Start IV lasix 40 mg twice daily - Continue enalapril  and Toprol  XL - Repeat TTE pending - Strict I&O, daily weights - Transferred to main campus for possible cath/further cardiology workup/management.   Elevated troponin Rule out ACS versus demand ischemia - Troponin elevated and uptrending - Chest pressure at intake resolved with supportive care - EKG shows normal sinus rhythm noted PACs without overt ST elevations - Cardiology following, recommends transferring to [facility] and holding off on initiating heparin drip for now - Troponin peak at 331 overnight, now downtrending appropriately   Hyponatremia - Likely hypervolemic in nature, continue diuretics - Urine lytes not useful in the setting of diuretics; elevated sodium level is likely inaccurate   A-fib, rate controlled - Continue on Toprol -XL - Hold Eliquis  per cardiology -no need for heparin drip at this time - Telemetry   HLD - Continue atorvastatin    Neuropathy - Continue gabapentin   DVT prophylaxis: Place and maintain sequential compression device Start: 02/24/24 0229 Code Status:   Code Status: Full Code Family Communication: Daughter at bedside  Status is: Inpatient  Dispo: The patient is from: Home               Anticipated d/c is to: Home              Anticipated d/c date is: 24 to 48 hours              Patient currently not medically stable for discharge  Consultants:  Cardiology  Procedures:  None  Antimicrobials:  None indicated  Subjective: No acute issues or events overnight currently denies any nausea vomiting diarrhea constipation headache fevers chills shortness of breath chest pain orthopnea or dyspnea with exertion.  Objective: Vitals:   02/24/24 0300 02/24/24 0330 02/24/24 0400 02/24/24 0613  BP: 118/82 121/67 127/71 114/63  Pulse: (!) 111 (!) 58 (!) 57 (!) 55  Resp: (!) 23 18 18 18   Temp:    97.6 F (36.4 C)  TempSrc:    Oral  SpO2: 95% 95% 94% 95%  Weight:      Height:        Intake/Output Summary (Last 24 hours) at 02/24/2024 0750 Last data filed at 02/24/2024 0237 Gross per 24 hour  Intake --  Output 1800 ml  Net -1800 ml   Filed Weights   02/23/24 2200 02/23/24 2312  Weight: 63.5 kg 63.5 kg    Examination:  General:  Pleasantly resting in bed, No acute distress. HEENT:  Normocephalic atraumatic.  Sclerae nonicteric, noninjected.  Extraocular movements intact bilaterally. Neck:  Without mass or deformity.  Trachea is midline. Lungs:  Clear to auscultate bilaterally without rhonchi, wheeze, or rales. Heart:  Regular rate and rhythm.  Without murmurs, rubs, or gallops. Abdomen:  Soft, nontender, nondistended.  Without guarding or rebound.  Extremities: Without cyanosis, clubbing, scant to 1+ edema, or obvious deformity.   Data Reviewed: I have personally reviewed following labs and imaging studies  CBC: Recent Labs  Lab 02/23/24 1927  WBC 17.4*  HGB 12.1  HCT 37.2  MCV 94.2  PLT 228   Basic Metabolic Panel: Recent Labs  Lab 02/23/24 1927 02/23/24 2242  NA 128*  --   K 4.0  --   CL 92*  --   CO2 24  --   GLUCOSE 132*  --   BUN 36*  --   CREATININE 1.04*  --   CALCIUM  9.1  --   MG  --  1.9   GFR: Estimated Creatinine Clearance: 35.4  mL/min (A) (by C-G formula based on SCr of 1.04 mg/dL (H)). Liver Function Tests: Recent Labs  Lab 02/23/24 2242  AST 88*  ALT 136*  ALKPHOS 46  BILITOT 1.1  PROT 7.7  ALBUMIN 3.9   Thyroid  Function Tests: Recent Labs    02/23/24 2242  TSH 2.766   No results found for this or any previous visit (from the past 240 hours).   Radiology Studies: DG Chest 2 View Result Date: 02/23/2024 CLINICAL DATA:  chest pain EXAM: CHEST - 2 VIEW COMPARISON:  November 07, 2017 FINDINGS: Lower lung volumes. Bilateral perihilar interstitial opacities with patchy opacities in both lung bases. Blunting of both costophrenic sulci. Mild cardiomegaly. Tortuous aorta with aortic atherosclerosis.No acute fracture or destructive lesion. IMPRESSION: 1. Mild cardiomegaly. Low lung volumes with changes of either interstitial edema, bronchovascular crowding, or atypical/viral infection. 2. Trace bilateral pleural effusions. Electronically Signed   By: Rogelia Myers M.D.   On: 02/23/2024 19:18        Scheduled Meds:  atorvastatin   40 mg Oral QHS   cholecalciferol  1,000 Units Oral QHS   enalapril   10 mg Oral BID   furosemide  40 mg Intravenous BID   gabapentin   300 mg Oral QHS   metoprolol  succinate  25 mg Oral Daily   Continuous Infusions:   LOS: 1 day   Time spent:  Elsie JAYSON Montclair, DO [facility]  If 7PM-7AM, please contact night-coverage [email or web address]  02/24/2024, 7:50 AM

## 2024-02-24 NOTE — Progress Notes (Signed)
 PHARMACY - ANTICOAGULATION CONSULT NOTE  Pharmacy Consult for heparin Indication: chest pain/ACS  No Known Allergies  Patient Measurements: Height: 5' 4 (162.6 cm) Weight: 64.2 kg (141 lb 8.6 oz) IBW/kg (Calculated) : 54.7 HEPARIN DW (KG): 64.2  Vital Signs: Temp: 97.7 F (36.5 C) (06/29 1056) Temp Source: Oral (06/29 1056) BP: 102/82 (06/29 1056) Pulse Rate: 110 (06/29 1056)  Labs: Recent Labs    02/23/24 1927 02/23/24 2242 02/24/24 0221  HGB 12.1  --   --   HCT 37.2  --   --   PLT 228  --   --   APTT  --  32  --   HEPARINUNFRC  --  >1.10*  --   CREATININE 1.04*  --   --   TROPONINIHS 223* 337* 321*    Estimated Creatinine Clearance: 35.4 mL/min (A) (by C-G formula based on SCr of 1.04 mg/dL (H)).   Medical History: Past Medical History:  Diagnosis Date   Allergy    Arthritis    ASCUS (atypical squamous cells of undetermined significance) on Pap smear 2011x2   Negative high-risk HPV, normal Pap smears 2012/2013   Atrial fibrillation (HCC)    Atypical nevus 05/02/2007   right back-moderate    Cancer (HCC)    Cataract    Diverticulosis    GERD (gastroesophageal reflux disease)    Hiatal hernia    Hyperlipemia    Hypertension    IBS (irritable bowel syndrome)    Kidney disease    Lymphocytic colitis 2007   Neuropathy    SCC (squamous cell carcinoma) 01/19/2006   bridge of nose (CX35FU)   SCC (squamous cell carcinoma) 03/08/2017   right inner lower shin (CX35FU)   SCC (squamous cell carcinoma) x 2 02/24/2002   right bridge of nose (Cx35FU),right bridge of nose   Uterine prolapse    Pessary     Assessment: 83 yo female presents with chest pressure, on apixaban  LD 2/28 @ 1430. Pharmacy consulted to dose IV heparin   -baseline heparin level > 1.1 due to recent apixaban  -hg = 12.1   Goal of Therapy:  Heparin level 0.3-0.7 units/ml aPTT 66-102 seconds Monitor platelets by anticoagulation protocol: Yes   Plan:  -Start heparin at 900  units/hr -aPTT in 8 hrs -Daily aPTT, heparin level and CBC  Prentice Poisson, PharmD Clinical Pharmacist **Pharmacist phone directory can now be found on [email or web address] ([email or web address]).  Listed under [facility].

## 2024-02-24 NOTE — Progress Notes (Incomplete)
 Attending Note  Patient seen and discussed with PA Johnson, I agree with her documentation. 83 yo female history of PAF, CKD 3, HTN, admitted with SOB and chest pain   Na 128 BUN 36 Cr 1.04 K 4 WBC 17.4 Plt 228 BNP 1329 Mg 1.9 TSH 2.7  Trop 223-->337-->321 EKG SR, no acute ischemic changes CXR edema vs atypical infection 06/2023 echo: LVEF 55-60%, no WMAs, grade I dd, normal RV    1.Acute HF - presented with SOB, LE edema, orthopnea.  - unknown type, echo is pending - CXR edema vs atypical infection, BNP 1329 -received IV lasix 20mg  yesterday, due for 40mg  bid today. Early limited I/Os thus far.  - certainly her frequent paroxsyms of afib with RVR could be playing a role.   2. Elevated troponin -episodes of chest of chest pain prior to admission - peak trop 337, trending down. EKG without acute ischemic changes - echo is pending.  - unclear if trop related to HF or possibly ACS. She has also had severel episodes afib with RVR this admission that is asymptomatic and could be related to trop elevation - F/u echo results to decide on ischemic testing.    3.Hyponatremia - hypervolemic hyponatremia - follow Na with diuresis  4. PAF - eliquis  on hold in case requires invasive testing - has been very paroxysmal this admission, SR by initial EKG. In and out of afib this morning, when afib rates to 150s. She is asymptomatic. - her SR can be in the mid 50s, she is on toprol  25mg  daily I don't think she will tolerate titration of beta blocker due to low sinus rates -suspect her frequent paroxysms of fast afib are playing a role in her new clinical diagnosis of heart failure - will start IV amiodarone to better control afib   Dorn Ross MD

## 2024-02-24 NOTE — Evaluation (Signed)
 Occupational Therapy Evaluation Patient Details Name: Kimberly Suarez MRN: [id number] DOB: 1941-03-17 Today's Date: 02/24/2024   History of Present Illness   Pt is a 83 y.o female admitted 6/28 for chest pains. Dx w/ pneumonia a few weeks ago. Elevated troponins, CHF exacerbations.PMH: a-fib, HTN, HLD, cataracts, CKD     Clinical Impressions Pt admitted based on above, and was seen based on problem list below. PTA pt was independent with ADLs and IADLs.Today pt is at her functional baseline of mod I/Independent with ADLs. Pt mobilizing in hallway with IV pole approx 172ft. Without cueing pt able to verbalize energy conservation strategies, and signs/symptoms of CHF exacerbation at home. Pt with good family support and safety awareness, no follow  up OT needs. No further acute OT needs identified. OT is signing off.        If plan is discharge home, recommend the following:   A little help with walking and/or transfers     Functional Status Assessment   Patient has had a recent decline in their functional status and demonstrates the ability to make significant improvements in function in a reasonable and predictable amount of time.     Equipment Recommendations   None recommended by OT      Precautions/Restrictions   Precautions Precautions: None Restrictions Weight Bearing Restrictions Per Provider Order: No     Mobility Bed Mobility   General bed mobility comments: Received BSC, returned to EOB    Transfers Overall transfer level: Needs assistance Equipment used:  (IV pole) Transfers: Sit to/from Stand, Bed to chair/wheelchair/BSC Sit to Stand: Supervision     Step pivot transfers: Supervision     General transfer comment: Use of IV pole, walked approc 165ft in hallway      Balance Overall balance assessment: Mild deficits observed, not formally tested       ADL either performed or assessed with clinical judgement   ADL Overall ADL's : Modified  independent       General ADL Comments: At baseline for ADLs,no AD, increased time     Vision Baseline Vision/History: 1 Wears glasses Vision Assessment?: No apparent visual deficits            Pertinent Vitals/Pain Pain Assessment Pain Assessment: No/denies pain     Extremity/Trunk Assessment Upper Extremity Assessment Upper Extremity Assessment: Overall WFL for tasks assessed   Lower Extremity Assessment Lower Extremity Assessment: Defer to PT evaluation   Cervical / Trunk Assessment Cervical / Trunk Assessment: Normal   Communication Communication Communication: No apparent difficulties   Cognition Arousal: Alert Behavior During Therapy: WFL for tasks assessed/performed Cognition: No apparent impairments     Following commands: Intact       Cueing  General Comments   Cueing Techniques: Verbal cues  VSS on RA           Home Living Family/patient expects to be discharged to:: Private residence Living Arrangements: Alone Available Help at Discharge: Family;Available PRN/intermittently (Children live nearby) Type of Home: House Home Access: Level entry (2 Steps to go from Den to living room w/ hand rail)     Home Layout: One level     Bathroom Shower/Tub: Walk-in shower;Tub/shower unit   Teacher, early years/pre: Yes How Accessible: Accessible via walker Home Equipment: Cane - single point;BSC/3in1;Grab bars - tub/shower          Prior Functioning/Environment Prior Level of Function : Independent/Modified Independent       Mobility Comments: No AD, no falls ADLs Comments:  Use of BSC for nighttime use    OT Problem List: Cardiopulmonary status limiting activity        OT Goals(Current goals can be found in the care plan section)   Acute Rehab OT Goals Patient Stated Goal: To go home OT Goal Formulation: With patient Time For Goal Achievement: 03/09/24 Potential to Achieve Goals: Good   AM-PAC OT 6  Clicks Daily Activity     Outcome Measure Help from another person eating meals?: None Help from another person taking care of personal grooming?: None Help from another person toileting, which includes using toliet, bedpan, or urinal?: None Help from another person bathing (including washing, rinsing, drying)?: None Help from another person to put on and taking off regular upper body clothing?: None Help from another person to put on and taking off regular lower body clothing?: None 6 Click Score: 24   End of Session Nurse Communication: Mobility status  Activity Tolerance: Patient tolerated treatment well Patient left: in bed;with call bell/phone within reach;with nursing/sitter in room;with family/visitor present  OT Visit Diagnosis: Muscle weakness (generalized) (M62.81)                Time: 8443-8383 OT Time Calculation (min): 20 min Charges:  OT General Charges $OT Visit: 1 Visit OT Evaluation $OT Eval Low Complexity: 1 Low  Adrianne BROCKS, OT  Acute Rehabilitation Services Office [phone] Secure chat preferred   Adrianne GORMAN Savers 02/24/2024, 4:27 PM

## 2024-02-24 NOTE — ED Notes (Signed)
 [facility] called for transport.

## 2024-02-25 ENCOUNTER — Inpatient Hospital Stay (HOSPITAL_COMMUNITY)

## 2024-02-25 DIAGNOSIS — I480 Paroxysmal atrial fibrillation: Secondary | ICD-10-CM | POA: Diagnosis not present

## 2024-02-25 DIAGNOSIS — I214 Non-ST elevation (NSTEMI) myocardial infarction: Secondary | ICD-10-CM | POA: Diagnosis not present

## 2024-02-25 DIAGNOSIS — R0602 Shortness of breath: Secondary | ICD-10-CM

## 2024-02-25 DIAGNOSIS — R7989 Other specified abnormal findings of blood chemistry: Secondary | ICD-10-CM | POA: Diagnosis not present

## 2024-02-25 DIAGNOSIS — E871 Hypo-osmolality and hyponatremia: Secondary | ICD-10-CM | POA: Diagnosis not present

## 2024-02-25 DIAGNOSIS — I509 Heart failure, unspecified: Secondary | ICD-10-CM | POA: Diagnosis not present

## 2024-02-25 DIAGNOSIS — I5031 Acute diastolic (congestive) heart failure: Secondary | ICD-10-CM | POA: Diagnosis not present

## 2024-02-25 LAB — BASIC METABOLIC PANEL WITH GFR
Anion gap: 16 — ABNORMAL HIGH (ref 5–15)
BUN: 31 mg/dL — ABNORMAL HIGH (ref 8–23)
CO2: 27 mmol/L (ref 22–32)
Calcium: 8.6 mg/dL — ABNORMAL LOW (ref 8.9–10.3)
Chloride: 87 mmol/L — ABNORMAL LOW (ref 98–111)
Creatinine, Ser: 1.37 mg/dL — ABNORMAL HIGH (ref 0.44–1.00)
GFR, Estimated: 38 mL/min — ABNORMAL LOW
Glucose, Bld: 215 mg/dL — ABNORMAL HIGH (ref 70–99)
Potassium: 2.9 mmol/L — ABNORMAL LOW (ref 3.5–5.1)
Sodium: 130 mmol/L — ABNORMAL LOW (ref 135–145)

## 2024-02-25 LAB — HEPARIN LEVEL (UNFRACTIONATED): Heparin Unfractionated: 0.74 [IU]/mL — ABNORMAL HIGH (ref 0.30–0.70)

## 2024-02-25 LAB — ECHOCARDIOGRAM COMPLETE
Area-P 1/2: 2.24 cm2
Calc EF: 42.5 %
Height: 64 in
S' Lateral: 3.7 cm
Single Plane A2C EF: 51.8 %
Single Plane A4C EF: 31 %
Weight: 2321 [oz_av]

## 2024-02-25 LAB — CBC
HCT: 35.9 % — ABNORMAL LOW (ref 36.0–46.0)
Hemoglobin: 12 g/dL (ref 12.0–15.0)
MCH: 30.1 pg (ref 26.0–34.0)
MCHC: 33.4 g/dL (ref 30.0–36.0)
MCV: 90 fL (ref 80.0–100.0)
Platelets: 232 10*3/uL (ref 150–400)
RBC: 3.99 MIL/uL (ref 3.87–5.11)
RDW: 13.7 % (ref 11.5–15.5)
WBC: 8.3 10*3/uL (ref 4.0–10.5)
nRBC: 0 % (ref 0.0–0.2)

## 2024-02-25 LAB — MAGNESIUM: Magnesium: 1.8 mg/dL (ref 1.7–2.4)

## 2024-02-25 LAB — APTT: aPTT: 68 s — ABNORMAL HIGH (ref 24–36)

## 2024-02-25 MED ORDER — AMIODARONE HCL 200 MG PO TABS
200.0000 mg | ORAL_TABLET | Freq: Two times a day (BID) | ORAL | Status: DC
  Administered 2024-02-25 – 2024-02-26 (×4): 200 mg via ORAL
  Filled 2024-02-25 (×4): qty 1

## 2024-02-25 MED ORDER — AMIODARONE HCL 200 MG PO TABS: 200.0000 mg | ORAL_TABLET | Freq: Every day | ORAL | Status: DC

## 2024-02-25 MED ORDER — POTASSIUM CHLORIDE CRYS ER 20 MEQ PO TBCR
40.0000 meq | EXTENDED_RELEASE_TABLET | Freq: Four times a day (QID) | ORAL | Status: AC
  Administered 2024-02-25 (×2): 40 meq via ORAL
  Filled 2024-02-25 (×2): qty 2

## 2024-02-25 MED ORDER — SODIUM CHLORIDE 0.9 % IV SOLN: INTRAVENOUS | Status: DC

## 2024-02-25 MED ORDER — PERFLUTREN LIPID MICROSPHERE
1.0000 mL | INTRAVENOUS | Status: AC | PRN
  Administered 2024-02-25: 2 mL via INTRAVENOUS

## 2024-02-25 MED ORDER — ASPIRIN 81 MG PO TBEC
81.0000 mg | DELAYED_RELEASE_TABLET | Freq: Every day | ORAL | Status: DC
  Administered 2024-02-25: 81 mg via ORAL
  Filled 2024-02-25: qty 1

## 2024-02-25 NOTE — Progress Notes (Signed)
 Physical Therapy Treatment Patient Details Name: Kimberly Suarez MRN: [id number] DOB: 1940-11-17 Today's Date: 02/25/2024   History of Present Illness Pt is a 83 y.o female admitted 6/28 for chest pains. Dx w/ pneumonia a few weeks ago. Elevated troponins, CHF exacerbations.PMH: a-fib, HTN, HLD, cataracts, CKD    PT Comments  Pt in bed upon arrival of PT, agreeable to evaluation at this time. Prior to admission the pt was independent with all mobility, not using DME, and with no falls. The pt reports attending chair exercise classes 2x/week but limited activity on other days due to pain in bilateral knees. The pt was able to complete transfers and hallway ambulation with CGA for safety. Pt initially reaching for UE support on rail or furniture, but improved with continued mobility. The pt was able to complete x2 stairs with single UE support and CGA for safety. Pt reports impacted by pain but generally able to complete mobility without additional assistance. Pt seems most limited by pain in knees and poor functional power and strength in LE, recommend return home with family support and OPPT for targeted strengthening of LE and balance training. Pt and daughter in agreement with plan.    If plan is discharge home, recommend the following: A little help with walking and/or transfers;A little help with bathing/dressing/bathroom;Assistance with cooking/housework;Assist for transportation;Help with stairs or ramp for entrance   Can travel by private vehicle        Equipment Recommendations  None recommended by PT    Recommendations for Other Services       Precautions / Restrictions Precautions Precautions: None Recall of Precautions/Restrictions: Intact Restrictions Weight Bearing Restrictions Per Provider Order: No     Mobility  Bed Mobility Overal bed mobility: Needs Assistance Bed Mobility: Supine to Sit     Supine to sit: Supervision     General bed mobility comments:  supervision, HOB was elevated but pt likely could do without    Transfers Overall transfer level: Needs assistance Equipment used:  (IV pole) Transfers: Sit to/from Stand, Bed to chair/wheelchair/BSC Sit to Stand: Supervision   Step pivot transfers: Supervision       General transfer comment: Use of IV pole to pull to standing. pt reports limited by pain. reaching for UE support when standing    Ambulation/Gait Ambulation/Gait assistance: Contact guard assist Gait Distance (Feet): 250 Feet Assistive device: None Gait Pattern/deviations: Step-through pattern, Decreased stride length Gait velocity: 0.56 m/s Gait velocity interpretation: 1.31 - 2.62 ft/sec, indicative of limited community ambulator   General Gait Details: pt with intermittent UE support on hallway rail, initially slowed speed with poor stability, improved with mobility. single LOB with turning and reaching outside of BOS   Stairs Stairs: Yes Stairs assistance: Contact guard assist Stair Management: One rail Left, Alternating pattern, Forwards Number of Stairs: 2 General stair comments: discussed up with good and down with bad, pt reports both knees are bad. able to complete with single UE support on rail      Balance Overall balance assessment: Needs assistance Sitting-balance support: No upper extremity supported, Feet supported Sitting balance-Leahy Scale: Good     Standing balance support: Single extremity supported, During functional activity Standing balance-Leahy Scale: Fair Standing balance comment: single LOB with turning to reach outside BOS                            Communication Communication Communication: No apparent difficulties  Cognition Arousal: Alert Behavior During  Therapy: WFL for tasks assessed/performed   PT - Cognitive impairments: No apparent impairments                       PT - Cognition Comments: WFL for session, not formally assessed Following  commands: Intact      Cueing Cueing Techniques: Verbal cues  Exercises General Exercises - Lower Extremity Long Arc Quad: AROM, Both, 10 reps, Seated Hip Flexion/Marching: AROM, Both, 10 reps, Seated Heel Raises: AROM, Both, 10 reps, Seated    General Comments General comments (skin integrity, edema, etc.): VSS on RA      Pertinent Vitals/Pain Pain Assessment Pain Assessment: 0-10 Pain Score: 1  Pain Location: bilateral knees with stairs and sit-stands Pain Descriptors / Indicators: Discomfort Pain Intervention(s): Limited activity within patient's tolerance, Monitored during session, Repositioned    Home Living Family/patient expects to be discharged to:: Private residence Living Arrangements: Alone Available Help at Discharge: Family;Available PRN/intermittently (Children live nearby) Type of Home: House Home Access: Level entry (2 Steps to go from Den to living room w/ hand rail)       Home Layout: One level Home Equipment: Cane - single point;BSC/3in1;Grab bars - tub/shower      Prior Function            PT Goals (current goals can now be found in the care plan section) Acute Rehab PT Goals Patient Stated Goal: improve strength in BLE to prepare for knee surgery PT Goal Formulation: With patient Time For Goal Achievement: 03/10/24 Potential to Achieve Goals: Good    Frequency    Min 2X/week       AM-PAC PT 6 Clicks Mobility   Outcome Measure  Help needed turning from your back to your side while in a flat bed without using bedrails?: None Help needed moving from lying on your back to sitting on the side of a flat bed without using bedrails?: None Help needed moving to and from a bed to a chair (including a wheelchair)?: A Little Help needed standing up from a chair using your arms (e.g., wheelchair or bedside chair)?: A Little Help needed to walk in hospital room?: A Little Help needed climbing 3-5 steps with a railing? : A Little 6 Click Score:  20    End of Session Equipment Utilized During Treatment: Gait belt Activity Tolerance: Patient tolerated treatment well Patient left: in bed;with call bell/phone within reach;with family/visitor present Nurse Communication: Mobility status PT Visit Diagnosis: Unsteadiness on feet (R26.81);Muscle weakness (generalized) (M62.81);Pain Pain - Right/Left: Right Pain - part of body: Knee     Time: 1230-1300 PT Time Calculation (min) (ACUTE ONLY): 30 min  Charges:    $Therapeutic Exercise: 8-22 mins PT General Charges $$ ACUTE PT VISIT: 1 Visit                     Izetta Call, PT, DPT   Acute Rehabilitation Department Office [phone] Secure Chat Communication Preferred   Izetta JULIANNA Call 02/25/2024, 1:20 PM

## 2024-02-25 NOTE — Plan of Care (Signed)

## 2024-02-25 NOTE — Progress Notes (Addendum)
 Progress Note  Patient Name: Kimberly Suarez Date of Encounter: 02/25/2024 [facility] Cardiologist: Lynwood Schilling, MD   Interval Summary   Patient reports feeling good this morning Good urine output with IV Lasix  Denies any chest pain, shortness of breath  Has been ambulating around room without issues  Pending updated echo  Vital Signs Vitals:   02/24/24 1949 02/24/24 2304 02/25/24 0345 02/25/24 0349  BP: 132/65 (!) 114/53 124/61   Pulse: (!) 54 (!) 56 (!) 50   Resp:      Temp: 98.2 F (36.8 C) 98.7 F (37.1 C) 98.5 F (36.9 C)   TempSrc: Oral Oral Oral   SpO2: 97% 100% 97%   Weight:    65.8 kg  Height:        Intake/Output Summary (Last 24 hours) at 02/25/2024 0906 Last data filed at 02/25/2024 0342 Gross per 24 hour  Intake 577.3 ml  Output 4000 ml  Net -3422.7 ml      02/25/2024    3:49 AM 02/24/2024   10:53 AM 02/23/2024   11:12 PM  Last 3 Weights  Weight (lbs) 145 lb 1 oz 141 lb 8.6 oz 140 lb  Weight (kg) 65.8 kg 64.2 kg 63.504 kg     Telemetry/ECG  Sinus bradycardia, HR 50s - Personally Reviewed  Physical Exam  GEN: No acute distress.   Neck: No JVD Cardiac: bradycardia, regular rhythm.  Respiratory: decreased breath sounds at bases, expiratory wheezing. GI: Soft, nontender, non-distended  MS: trace edema  Assessment & Plan  83 y.o. female with a hx of HTN, HLD, Stage III CKD, carotid artery stenosis (dopplers in 12/2023 showed 1-39% RICA and 40-59% LICA stenoses) and paroxysmal atrial fibrillation who is being seen for dyspnea and decompensated heart failure   Acute CHF, unknown EF  Presented to ED with shortness of breath, LE edema, orthopnea BNP 1,329 CXR showed cardiomegaly, edema vs infection, trace effusions  Echo 06/2023: LVEF 55-60%, G1DD, normal RV function, normal IVC  Started on IV Lasix.   Net -5.2 L this admission  Pending labs, have not updated labs since 6/28 Labs came back with bump in Cr.  She appears euvolemic on  exam.  Will d/c IV lasix.  Potassium 2.9, will replete  Elevated troponin level  223 > 337 > 321 EKG showed no acute ischemic changes Some reported episodes of chest pain prior to admission Unclear if troponin elevation 2/2 to CHF, A. Fib with RVR or ACS -- awaiting results of echo Pending echo -- results will determine need for ischemic evaluation   Atrial fibrillation with RVR  Known history of PAF on monitor 07/2023 with 2% burden, average HR 130s when in AF Asymptomatic when in A. Fib even with high rates When in sinus, rates 40-50s therefore would not tolerate higher dose of BB  Currently in sinus with HR 50s  Most recent HR 52, BP 124/61 Continue IV amiodarone  Holding BB this morning due to bradycardia  Pending echo   Hyperlipidemia  10/26/2023: HDL 43; LDL Chol Calc (NIH) 80 02/23/2024: ALT 136  Continue Lipitor 40 mg daily    For questions or updates, please contact [facility] Please consult [email or web address] for contact info under       Signed, Waddell DELENA Donath, PA-C   Patient seen and examined.  Agree with above documentation.  On exam, patient is alert and oriented, bradycardic, regular, no murmurs, lungs CTAB, no LE edema or JVD.  Net -3.4 L yesterday, -5.2  L on admission.  She is diuresing well.  Appears euvolemic on exam.  Worsening renal function, will discontinue IV Lasix.  Maintaining sinus rhythm with IV amiodarone, will convert to p.o. today.  Will follow-up echocardiogram today.  If EF reduced, likely plan for [facility] tomorrow.   Kimberly LITTIE Nanas, MD

## 2024-02-25 NOTE — Progress Notes (Signed)
 Mobility Specialist Progress Note:    02/25/24 1006  Mobility  Activity Ambulated independently in hallway  Level of Assistance Standby assist, set-up cues, supervision of patient - no hands on  Assistive Device None  Distance Ambulated (ft) 200 ft  Activity Response Tolerated well  Mobility Referral Yes  Mobility visit 1 Mobility  Mobility Specialist Start Time (ACUTE ONLY) 1006  Mobility Specialist Stop Time (ACUTE ONLY) 1018  Mobility Specialist Time Calculation (min) (ACUTE ONLY) 12 min   Pt pleasant and agreeable to session. Needed to use [facility] 1st before beginning. Transferred herself there and had the aid of daughter to clean. Pt moved well ambulating. No c/o symptoms. Pt left at EOB comfortable w/ all needs met and daughter in room.   Therisa Rana Mobility Specialist Please contact via SecureChat or  Rehab office at [phone]

## 2024-02-25 NOTE — H&P (View-Only) (Signed)
 Progress Note  Patient Name: Kimberly Suarez Date of Encounter: 02/25/2024 [facility] Cardiologist: Lynwood Schilling, MD   Interval Summary   Patient reports feeling good this morning Good urine output with IV Lasix  Denies any chest pain, shortness of breath  Has been ambulating around room without issues  Pending updated echo  Vital Signs Vitals:   02/24/24 1949 02/24/24 2304 02/25/24 0345 02/25/24 0349  BP: 132/65 (!) 114/53 124/61   Pulse: (!) 54 (!) 56 (!) 50   Resp:      Temp: 98.2 F (36.8 C) 98.7 F (37.1 C) 98.5 F (36.9 C)   TempSrc: Oral Oral Oral   SpO2: 97% 100% 97%   Weight:    65.8 kg  Height:        Intake/Output Summary (Last 24 hours) at 02/25/2024 0906 Last data filed at 02/25/2024 0342 Gross per 24 hour  Intake 577.3 ml  Output 4000 ml  Net -3422.7 ml      02/25/2024    3:49 AM 02/24/2024   10:53 AM 02/23/2024   11:12 PM  Last 3 Weights  Weight (lbs) 145 lb 1 oz 141 lb 8.6 oz 140 lb  Weight (kg) 65.8 kg 64.2 kg 63.504 kg     Telemetry/ECG  Sinus bradycardia, HR 50s - Personally Reviewed  Physical Exam  GEN: No acute distress.   Neck: No JVD Cardiac: bradycardia, regular rhythm.  Respiratory: decreased breath sounds at bases, expiratory wheezing. GI: Soft, nontender, non-distended  MS: trace edema  Assessment & Plan  83 y.o. female with a hx of HTN, HLD, Stage III CKD, carotid artery stenosis (dopplers in 12/2023 showed 1-39% RICA and 40-59% LICA stenoses) and paroxysmal atrial fibrillation who is being seen for dyspnea and decompensated heart failure   Acute CHF, unknown EF  Presented to ED with shortness of breath, LE edema, orthopnea BNP 1,329 CXR showed cardiomegaly, edema vs infection, trace effusions  Echo 06/2023: LVEF 55-60%, G1DD, normal RV function, normal IVC  Started on IV Lasix.   Net -5.2 L this admission  Pending labs, have not updated labs since 6/28 Labs came back with bump in Cr.  She appears euvolemic on  exam.  Will d/c IV lasix.  Potassium 2.9, will replete  Elevated troponin level  223 > 337 > 321 EKG showed no acute ischemic changes Some reported episodes of chest pain prior to admission Unclear if troponin elevation 2/2 to CHF, A. Fib with RVR or ACS -- awaiting results of echo Pending echo -- results will determine need for ischemic evaluation   Atrial fibrillation with RVR  Known history of PAF on monitor 07/2023 with 2% burden, average HR 130s when in AF Asymptomatic when in A. Fib even with high rates When in sinus, rates 40-50s therefore would not tolerate higher dose of BB  Currently in sinus with HR 50s  Most recent HR 52, BP 124/61 Continue IV amiodarone  Holding BB this morning due to bradycardia  Pending echo   Hyperlipidemia  10/26/2023: HDL 43; LDL Chol Calc (NIH) 80 02/23/2024: ALT 136  Continue Lipitor 40 mg daily    For questions or updates, please contact [facility] Please consult [email or web address] for contact info under       Signed, Waddell DELENA Donath, PA-C   Patient seen and examined.  Agree with above documentation.  On exam, patient is alert and oriented, bradycardic, regular, no murmurs, lungs CTAB, no LE edema or JVD.  Net -3.4 L yesterday, -5.2  L on admission.  She is diuresing well.  Appears euvolemic on exam.  Worsening renal function, will discontinue IV Lasix.  Maintaining sinus rhythm with IV amiodarone, will convert to p.o. today.  Will follow-up echocardiogram today.  If EF reduced, likely plan for [facility] tomorrow.   Lonni LITTIE Nanas, MD

## 2024-02-25 NOTE — Progress Notes (Signed)
 PHARMACY - ANTICOAGULATION CONSULT NOTE  Pharmacy Consult for heparin Indication: chest pain/ACS/Afib  No Known Allergies  Patient Measurements: Height: 5' 4 (162.6 cm) Weight: 65.8 kg (145 lb 1 oz) IBW/kg (Calculated) : 54.7 HEPARIN DW (KG): 64.2  Vital Signs: Temp: 98.5 F (36.9 C) (06/30 0345) Temp Source: Oral (06/30 0345) BP: 135/63 (06/30 0913) Pulse Rate: 51 (06/30 0913)  Labs: Recent Labs    02/23/24 1927 02/23/24 2242 02/24/24 0221 02/24/24 2229 02/25/24 0852 02/25/24 0919  HGB 12.1  --   --   --   --  12.0  HCT 37.2  --   --   --   --  35.9*  PLT 228  --   --   --   --  232  APTT  --  32  --  45* 68*  --   HEPARINUNFRC  --  >1.10*  --  1.08* 0.74*  --   CREATININE 1.04*  --   --   --   --  1.37*  TROPONINIHS 223* 337* 321*  --   --   --     Estimated Creatinine Clearance: 29 mL/min (A) (by C-G formula based on SCr of 1.37 mg/dL (H)).   Medical History: Past Medical History:  Diagnosis Date   Allergy    Arthritis    ASCUS (atypical squamous cells of undetermined significance) on Pap smear 2011x2   Negative high-risk HPV, normal Pap smears 2012/2013   Atrial fibrillation (HCC)    Atypical nevus 05/02/2007   right back-moderate    Cancer (HCC)    Cataract    Diverticulosis    GERD (gastroesophageal reflux disease)    Hiatal hernia    Hyperlipemia    Hypertension    IBS (irritable bowel syndrome)    Kidney disease    Lymphocytic colitis 2007   Neuropathy    SCC (squamous cell carcinoma) 01/19/2006   bridge of nose (CX35FU)   SCC (squamous cell carcinoma) 03/08/2017   right inner lower shin (CX35FU)   SCC (squamous cell carcinoma) x 2 02/24/2002   right bridge of nose (Cx35FU),right bridge of nose   Uterine prolapse    Pessary     Assessment: 83 yo female presents with chest pressure, on apixaban  LD 2/28 @ 1430. Pharmacy consulted to dose IV heparin   aPTT this morning came back therapeutic at 68, on heparin infusion at 1100 units/hr  (heparin level still slightly falsely elevated at 0.74 2/t recent DOAC). Hgb 12, plt 232. No s/sx of bleeding or infusion issues.   Goal of Therapy:  Heparin level 0.3-0.7 units/ml aPTT 66-102 seconds Monitor platelets by anticoagulation protocol: Yes   Plan:  -Continue heparin infusion at 1100 units/hr  -Daily aPTT, heparin level until correlate and CBC  Thank you for allowing pharmacy to participate in this patient's care,  Suzen Sour, PharmD, BCCCP Clinical Pharmacist  Phone: [phone] 02/25/2024 11:30 AM  Please check AMION for all [facility] Pharmacy phone numbers After 10:00 PM, call Main Pharmacy [phone]

## 2024-02-25 NOTE — Progress Notes (Signed)
 PROGRESS NOTE    Kimberly Suarez  FMW:[id number] DOB: March 10, 1941 DOA: 02/23/2024 PCP: Jolinda Norene HERO, DO   Brief Narrative:  Kimberly Suarez is a 83 y.o. female with medical history significant for congestive heart failure, HTN, HLD, A-fib on Eliquis , arthritis, IBS, skin cancer, neuropathy and GERD who presented to the ED for evaluation of shortness of breath and chest pain.   Assessment & Plan:   Acute diastolic heart failure exacerbation Last TTE on 06/2023 shows EF 55 to 60%, G1DD but no valvular abnormalities Patient was started on diuretics. Cardiology was consulted due to elevated troponin level. Repeat echocardiogram is pending. Continue strict ins and outs and daily weights. Labs are pending from today.   Elevated troponin Rule out ACS versus demand ischemia Noted to have elevated troponin levels.  EKG did not show any obvious ischemic changes. Cardiology is following.  Patient is on IV heparin infusion.  Patient is not noted to be on aspirin.-Initiate aspirin. Further management per cardiology.   Hyponatremia Probably due to hypovolemia.  Labs are pending from today.   A-fib, rate controlled - Continue on Toprol -XL On IV heparin   HLD - Continue atorvastatin    Neuropathy - Continue gabapentin   DVT prophylaxis: On IV heparin Code Status: Full code Family Communication: Daughter at bedside Disposition: Hopefully return home when improved.   Consultants:  Cardiology  Procedures:  None  Antimicrobials:  None indicated  Subjective: Patient denies any chest pain this morning.  Lower extremity swelling has improved.    Objective: Vitals:   02/24/24 2304 02/25/24 0345 02/25/24 0349 02/25/24 0913  BP: (!) 114/53 124/61  135/63  Pulse: (!) 56 (!) 50  (!) 51  Resp:      Temp: 98.7 F (37.1 C) 98.5 F (36.9 C)    TempSrc: Oral Oral    SpO2: 100% 97%    Weight:   65.8 kg   Height:        Intake/Output Summary (Last 24 hours) at 02/25/2024  1003 Last data filed at 02/25/2024 0928 Gross per 24 hour  Intake 577.3 ml  Output 4500 ml  Net -3922.7 ml   Filed Weights   02/23/24 2312 02/24/24 1053 02/25/24 0349  Weight: 63.5 kg 64.2 kg 65.8 kg    Examination:  General appearance: Awake alert.  In no distress Resp: Clear to auscultation bilaterally.  Normal effort Cardio: S1-S2 is normal regular.  No S3-S4.  No rubs murmurs or bruit GI: Abdomen is soft.  Nontender nondistended.  Bowel sounds are present normal.  No masses organomegaly Extremities: No edema.  Full range of motion of lower extremities. Neurologic: Alert and oriented x3.  No focal neurological deficits.     Data Reviewed: I have personally reviewed following labs and imaging studies  CBC: Recent Labs  Lab 02/23/24 1927  WBC 17.4*  HGB 12.1  HCT 37.2  MCV 94.2  PLT 228   Basic Metabolic Panel: Recent Labs  Lab 02/23/24 1927 02/23/24 2242  NA 128*  --   K 4.0  --   CL 92*  --   CO2 24  --   GLUCOSE 132*  --   BUN 36*  --   CREATININE 1.04*  --   CALCIUM  9.1  --   MG  --  1.9   GFR: Estimated Creatinine Clearance: 38.2 mL/min (A) (by C-G formula based on SCr of 1.04 mg/dL (H)). Liver Function Tests: Recent Labs  Lab 02/23/24 2242  AST 88*  ALT 136*  ALKPHOS  46  BILITOT 1.1  PROT 7.7  ALBUMIN 3.9   Thyroid  Function Tests: Recent Labs    02/23/24 2242  TSH 2.766     Radiology Studies: DG Chest 2 View Result Date: 02/23/2024 CLINICAL DATA:  chest pain EXAM: CHEST - 2 VIEW COMPARISON:  November 07, 2017 FINDINGS: Lower lung volumes. Bilateral perihilar interstitial opacities with patchy opacities in both lung bases. Blunting of both costophrenic sulci. Mild cardiomegaly. Tortuous aorta with aortic atherosclerosis.No acute fracture or destructive lesion. IMPRESSION: 1. Mild cardiomegaly. Low lung volumes with changes of either interstitial edema, bronchovascular crowding, or atypical/viral infection. 2. Trace bilateral pleural  effusions. Electronically Signed   By: Rogelia Myers M.D.   On: 02/23/2024 19:18     Scheduled Meds:  atorvastatin   40 mg Oral QHS   cholecalciferol  1,000 Units Oral QHS   furosemide  40 mg Intravenous BID   gabapentin   300 mg Oral QHS   metoprolol  succinate  25 mg Oral Daily   Continuous Infusions:  amiodarone 30 mg/hr (02/25/24 0342)   heparin 1,100 Units/hr (02/24/24 2356)     LOS: 2 days    Joette Pebbles,  [facility]  If 7PM-7AM, please contact night-coverage [email or web address]  02/25/2024, 10:04 AM

## 2024-02-26 ENCOUNTER — Encounter (HOSPITAL_COMMUNITY): Payer: Self-pay | Admitting: Student

## 2024-02-26 ENCOUNTER — Encounter (HOSPITAL_COMMUNITY)
Admission: EM | Disposition: A | Discharge status: Home Health | Payer: Self-pay | Source: Home / Self Care | Attending: Internal Medicine

## 2024-02-26 ENCOUNTER — Other Ambulatory Visit (HOSPITAL_COMMUNITY): Payer: Self-pay

## 2024-02-26 DIAGNOSIS — R7989 Other specified abnormal findings of blood chemistry: Secondary | ICD-10-CM | POA: Diagnosis not present

## 2024-02-26 DIAGNOSIS — I480 Paroxysmal atrial fibrillation: Secondary | ICD-10-CM | POA: Diagnosis not present

## 2024-02-26 DIAGNOSIS — I5021 Acute systolic (congestive) heart failure: Secondary | ICD-10-CM

## 2024-02-26 DIAGNOSIS — I4891 Unspecified atrial fibrillation: Secondary | ICD-10-CM | POA: Diagnosis not present

## 2024-02-26 HISTORY — PX: RIGHT/LEFT HEART CATH AND CORONARY ANGIOGRAPHY: CATH118266

## 2024-02-26 LAB — POCT I-STAT 7, (LYTES, BLD GAS, ICA,H+H)
Acid-Base Excess: 2 mmol/L (ref 0.0–2.0)
Bicarbonate: 27 mmol/L (ref 20.0–28.0)
Calcium, Ion: 1.08 mmol/L — ABNORMAL LOW (ref 1.15–1.40)
HCT: 35 % — ABNORMAL LOW (ref 36.0–46.0)
Hemoglobin: 11.9 g/dL — ABNORMAL LOW (ref 12.0–15.0)
O2 Saturation: 99 %
Potassium: 3.9 mmol/L (ref 3.5–5.1)
Sodium: 135 mmol/L (ref 135–145)
TCO2: 28 mmol/L (ref 22–32)
pCO2 arterial: 42.8 mmHg (ref 32–48)
pH, Arterial: 7.408 (ref 7.35–7.45)
pO2, Arterial: 116 mmHg — ABNORMAL HIGH (ref 83–108)

## 2024-02-26 LAB — POCT I-STAT EG7
Acid-Base Excess: 1 mmol/L (ref 0.0–2.0)
Acid-Base Excess: 4 mmol/L — ABNORMAL HIGH (ref 0.0–2.0)
Bicarbonate: 27.4 mmol/L (ref 20.0–28.0)
Bicarbonate: 30.3 mmol/L — ABNORMAL HIGH (ref 20.0–28.0)
Calcium, Ion: 1.04 mmol/L — ABNORMAL LOW (ref 1.15–1.40)
Calcium, Ion: 1.18 mmol/L (ref 1.15–1.40)
HCT: 34 % — ABNORMAL LOW (ref 36.0–46.0)
HCT: 37 % (ref 36.0–46.0)
Hemoglobin: 11.6 g/dL — ABNORMAL LOW (ref 12.0–15.0)
Hemoglobin: 12.6 g/dL (ref 12.0–15.0)
O2 Saturation: 70 %
O2 Saturation: 72 %
Potassium: 3.7 mmol/L (ref 3.5–5.1)
Potassium: 4.2 mmol/L (ref 3.5–5.1)
Sodium: 132 mmol/L — ABNORMAL LOW (ref 135–145)
Sodium: 134 mmol/L — ABNORMAL LOW (ref 135–145)
TCO2: 29 mmol/L (ref 22–32)
TCO2: 32 mmol/L (ref 22–32)
pCO2, Ven: 49.8 mmHg (ref 44–60)
pCO2, Ven: 50.5 mmHg (ref 44–60)
pH, Ven: 7.349 (ref 7.25–7.43)
pH, Ven: 7.387 (ref 7.25–7.43)
pO2, Ven: 39 mmHg (ref 32–45)
pO2, Ven: 39 mmHg (ref 32–45)

## 2024-02-26 LAB — BASIC METABOLIC PANEL WITH GFR
Anion gap: 8 (ref 5–15)
BUN: 32 mg/dL — ABNORMAL HIGH (ref 8–23)
CO2: 29 mmol/L (ref 22–32)
Calcium: 8.5 mg/dL — ABNORMAL LOW (ref 8.9–10.3)
Chloride: 94 mmol/L — ABNORMAL LOW (ref 98–111)
Creatinine, Ser: 1.29 mg/dL — ABNORMAL HIGH (ref 0.44–1.00)
GFR, Estimated: 41 mL/min — ABNORMAL LOW
Glucose, Bld: 117 mg/dL — ABNORMAL HIGH (ref 70–99)
Potassium: 3.9 mmol/L (ref 3.5–5.1)
Sodium: 131 mmol/L — ABNORMAL LOW (ref 135–145)

## 2024-02-26 LAB — CBC
HCT: 36.5 % (ref 36.0–46.0)
Hemoglobin: 12.2 g/dL (ref 12.0–15.0)
MCH: 29.9 pg (ref 26.0–34.0)
MCHC: 33.4 g/dL (ref 30.0–36.0)
MCV: 89.5 fL (ref 80.0–100.0)
Platelets: 241 10*3/uL (ref 150–400)
RBC: 4.08 MIL/uL (ref 3.87–5.11)
RDW: 13.6 % (ref 11.5–15.5)
WBC: 9.3 10*3/uL (ref 4.0–10.5)
nRBC: 0 % (ref 0.0–0.2)

## 2024-02-26 LAB — HEPARIN LEVEL (UNFRACTIONATED): Heparin Unfractionated: 0.95 [IU]/mL — ABNORMAL HIGH (ref 0.30–0.70)

## 2024-02-26 LAB — MAGNESIUM: Magnesium: 1.9 mg/dL (ref 1.7–2.4)

## 2024-02-26 LAB — APTT: aPTT: 73 s — ABNORMAL HIGH (ref 24–36)

## 2024-02-26 MED ORDER — HEPARIN (PORCINE) IN NACL 1000-0.9 UT/500ML-% IV SOLN
INTRAVENOUS | Status: DC | PRN
  Administered 2024-02-26 (×2): 500 mL

## 2024-02-26 MED ORDER — SODIUM CHLORIDE 0.9 % IV SOLN: INTRAVENOUS | Status: DC

## 2024-02-26 MED ORDER — APIXABAN 5 MG PO TABS
5.0000 mg | ORAL_TABLET | Freq: Two times a day (BID) | ORAL | Status: DC
  Administered 2024-02-26 – 2024-02-28 (×4): 5 mg via ORAL
  Filled 2024-02-26 (×4): qty 1

## 2024-02-26 MED ORDER — SODIUM CHLORIDE 0.9% FLUSH
3.0000 mL | Freq: Two times a day (BID) | INTRAVENOUS | Status: DC
  Administered 2024-02-26 – 2024-02-27 (×3): 3 mL via INTRAVENOUS

## 2024-02-26 MED ORDER — LIDOCAINE HCL (PF) 1 % IJ SOLN
INTRAMUSCULAR | Status: DC | PRN
  Administered 2024-02-26 (×2): 2 mL via INTRADERMAL

## 2024-02-26 MED ORDER — SODIUM CHLORIDE 0.9 % IV SOLN: 250.0000 mL | INTRAVENOUS | Status: DC | PRN

## 2024-02-26 MED ORDER — VERAPAMIL HCL 2.5 MG/ML IV SOLN
INTRAVENOUS | Status: AC
  Filled 2024-02-26: qty 2

## 2024-02-26 MED ORDER — ASPIRIN 81 MG PO CHEW
81.0000 mg | CHEWABLE_TABLET | ORAL | Status: AC
  Administered 2024-02-26: 81 mg via ORAL
  Filled 2024-02-26: qty 1

## 2024-02-26 MED ORDER — IOHEXOL 350 MG/ML SOLN
INTRAVENOUS | Status: DC | PRN
  Administered 2024-02-26: 20 mL

## 2024-02-26 MED ORDER — SODIUM CHLORIDE 0.9% FLUSH: 3.0000 mL | INTRAVENOUS | Status: DC | PRN

## 2024-02-26 MED ORDER — HEPARIN SODIUM (PORCINE) 1000 UNIT/ML IJ SOLN
INTRAMUSCULAR | Status: AC
  Filled 2024-02-26: qty 10

## 2024-02-26 MED ORDER — HEPARIN SODIUM (PORCINE) 1000 UNIT/ML IJ SOLN
INTRAMUSCULAR | Status: DC | PRN
  Administered 2024-02-26: 3500 [IU] via INTRAVENOUS

## 2024-02-26 MED ORDER — LIDOCAINE HCL (PF) 1 % IJ SOLN
INTRAMUSCULAR | Status: AC
  Filled 2024-02-26: qty 30

## 2024-02-26 MED ORDER — HYDRALAZINE HCL 20 MG/ML IJ SOLN: 10.0000 mg | INTRAMUSCULAR | Status: AC | PRN

## 2024-02-26 MED ORDER — VERAPAMIL HCL 2.5 MG/ML IV SOLN
INTRAVENOUS | Status: DC | PRN
  Administered 2024-02-26: 10 mL via INTRA_ARTERIAL

## 2024-02-26 NOTE — Progress Notes (Signed)
 Heart Failure Nurse Navigator Progress Note  PCP: Jolinda Norene HERO, DO PCP-Cardiologist: [place] Admission Diagnosis: Acute congestive heart failure, NSTEMI Admitted from: Home  Presentation:   Kimberly Suarez presented with chest pressure that occurred around 3 am in the morning, shortness of breath and a ongoing cough and chronic leg swelling that has become worse. BP 150/96, HR 66, BNP 1,329, Troponin 223, CXR with Mild cardiomegaly. Low lung volumes with changes of either interstitial edema, bronchovascular crowding, or atypical/viral infection. Trace bilateral pleural effusions. Left heart cath 02/26/2024, Coronary calcification without any significant obstructive coronary artery disease Mildly decompensated nonischemic cardiomyopathy.   Patient was educated on the sign and symptoms of heart failure. Daily weights, when to call her doctor or go to the ED. Diet/ fluid restrictions, taking all medications as prescribed and attending all medical appointments. Patient verbalized her understanding of all education. A HF TOC appointment was scheduled for 03/04/2024 @ 11 :45 am.   ECHO/ LVEF: 30-35%  Clinical Course:  Past Medical History:  Diagnosis Date   Allergy    Arthritis    ASCUS (atypical squamous cells of undetermined significance) on Pap smear 2011x2   Negative high-risk HPV, normal Pap smears 2012/2013   Atrial fibrillation (HCC)    Atypical nevus 05/02/2007   right back-moderate    Cancer (HCC)    Cataract    Diverticulosis    GERD (gastroesophageal reflux disease)    Hiatal hernia    Hyperlipemia    Hypertension    IBS (irritable bowel syndrome)    Kidney disease    Lymphocytic colitis 2007   Neuropathy    SCC (squamous cell carcinoma) 01/19/2006   bridge of nose (CX35FU)   SCC (squamous cell carcinoma) 03/08/2017   right inner lower shin (CX35FU)   SCC (squamous cell carcinoma) x 2 02/24/2002   right bridge of nose (Cx35FU),right bridge of nose   Uterine  prolapse    Pessary     Social History   Socioeconomic History   Marital status: Married    Spouse name: Not on file   Number of children: 3    Years of education: Not on file   Highest education level: 8th grade  Occupational History   Occupation: Retired   Tobacco Use   Smoking status: Never   Smokeless tobacco: Never  Vaping Use   Vaping status: Never Used  Substance and Sexual Activity   Alcohol use: Not Currently   Drug use: No   Sexual activity: Not Currently    Birth control/protection: Post-menopausal    Comment: 1st intercourse 15 yo--1 partner  Other Topics Concern   Not on file  Social History Narrative   Lives at home with husband.   Right-handed.   Caffeine use: one cup caffeine some days.   Social Drivers of Corporate investment banker Strain: Low Risk  (10/09/2018)   Overall Financial Resource Strain (CARDIA)    Difficulty of Paying Living Expenses: Not hard at all  Food Insecurity: No Food Insecurity (02/24/2024)   Hunger Vital Sign    Worried About Running Out of Food in the Last Year: Never true    Ran Out of Food in the Last Year: Never true  Transportation Needs: No Transportation Needs (02/24/2024)   PRAPARE - Administrator, Civil Service (Medical): No    Lack of Transportation (Non-Medical): No  Physical Activity: Insufficiently Active (10/09/2018)   Exercise Vital Sign    Days of Exercise per Week: 2 days  Minutes of Exercise per Session: 30 min  Stress: No Stress Concern Present (10/09/2018)   [employer] of Occupational Health - Occupational Stress Questionnaire    Feeling of Stress : Not at all  Social Connections: Moderately Isolated (02/24/2024)   Social Connection and Isolation Panel    Frequency of Communication with Friends and Family: More than three times a week    Frequency of Social Gatherings with Friends and Family: More than three times a week    Attends Religious Services: More than 4 times per year     Active Member of [employer] or Organizations: No    Attends Banker Meetings: Never    Marital Status: Widowed   Education Assessment and Provision:  Detailed education and instructions provided on heart failure disease management including the following:  Signs and symptoms of Heart Failure When to call the physician Importance of daily weights Low sodium diet Fluid restriction Medication management Anticipated future follow-up appointments  Patient education given on each of the above topics.  Patient acknowledges understanding via teach back method and acceptance of all instructions.  Education Materials:  Living Better With Heart Failure Booklet, HF zone tool, & Daily Weight Tracker Tool.  Patient has scale at home: Yes Patient has pill box at home: Yes    High Risk Criteria for Readmission and/or Poor Patient Outcomes: Heart failure hospital admissions (last 6 months): 1  No Show rate: 1 %  Difficult social situation: No, lives alone and her 3 children live close by.  Demonstrates medication adherence: Yes Primary Language: English  Literacy level: Reading, writing, and comprehension.   Barriers of Care:   Diet/ fluid restrictions ( drinks over 64 oz of water per day) Daily weights  Considerations/Referrals:   Referral made to Heart Failure Pharmacist Stewardship: Yes Referral made to Heart Failure CSW/NCM TOC: NA Referral made to Heart & Vascular TOC clinic: Yes, 03/04/2024 @ 11:45 am.   Items for Follow-up on DC/TOC: Continued HF education Diet/ fluid restrictions Daily weights.    Stephane Haddock, BSN, Scientist, clinical (histocompatibility and immunogenetics) Only

## 2024-02-26 NOTE — Progress Notes (Signed)
 PROGRESS NOTE    Kimberly Suarez  FMW:[id number] DOB: February 17, 1941 DOA: 02/23/2024 PCP: Jolinda Norene HERO, DO   Brief Narrative:  Kimberly Suarez is a 83 y.o. female with medical history significant for congestive heart failure, HTN, HLD, A-fib on Eliquis , arthritis, IBS, skin cancer, neuropathy and GERD who presented to the ED for evaluation of shortness of breath and chest pain.   Assessment & Plan:   Acute combined systolic and diastolic congestive heart failure Last TTE on 06/2023 shows EF 55 to 60%, G1DD but no valvular abnormalities Echocardiogram from this admission showed LVEF of 30 to 35% with treatment diastolic dysfunction.  Right ventricular systolic function was mildly reduced.  No significant valvular abnormalities noted. Patient underwent cardiac catheterization this morning which did not reveal significant obstruction. Diuretics on hold due to increasing creatinine. Cardiology continues to follow. Continue strict ins and outs and daily weights. GDMT per cardiology.  Elevated troponin Rule out ACS versus demand ischemia Noted to have elevated troponin levels.  EKG did not show any obvious ischemic changes. Patient was started on IV heparin infusion.  Aspirin was initiated. Seen by cardiology and underwent cardiac cath which did not reveal significant CAD. Plan is for transition back to apixaban  today.  Should be able to come off of aspirin now.     Hyponatremia Probably due to hypovolemia.  Stable.  Acute kidney injury on chronic kidney disease stage IIIb Baseline creatinine around 1.2.  Slight increase in creatinine noted during this admission.  Stable for the most part.  Continue to monitor.  Avoid nephrotoxic agents.  A-fib, rate controlled Apixaban  to be resumed later today.  Will discontinue the aspirin.  Continue amiodarone.  Beta-blocker on hold due to bradycardia.   HLD - Continue atorvastatin    Neuropathy - Continue gabapentin   DVT prophylaxis: On  apixaban  Code Status: Full code Family Communication: Daughter at bedside Disposition: Hopefully return home when improved.   Consultants:  Cardiology  Procedures:  Echocardiogram Cardiac catheterization  Subjective: Patient seen after she returned from the Cath Lab.  Denies any chest pain or shortness of breath.  Daughter is at the bedside.  Objective: Vitals:   02/26/24 0801 02/26/24 0806 02/26/24 0811 02/26/24 0828  BP: (!) 151/72 (!) 147/65 (!) 144/64 (!) 138/55  Pulse: (!) 55 (!) 54 (!) 53   Resp: 20 (!) 21 19 14   Temp:    (!) 97.5 F (36.4 C)  TempSrc:    Oral  SpO2: 96% 96% 96%   Weight:      Height:        Intake/Output Summary (Last 24 hours) at 02/26/2024 1054 Last data filed at 02/26/2024 0958 Gross per 24 hour  Intake 926.3 ml  Output 1350 ml  Net -423.7 ml   Filed Weights   02/24/24 1053 02/25/24 0349 02/26/24 0141  Weight: 64.2 kg 65.8 kg 63.7 kg    Examination:  General appearance: Awake alert.  In no distress Resp: Clear to auscultation bilaterally.  Normal effort Cardio: S1-S2 is normal regular.  No S3-S4.  No rubs murmurs or bruit GI: Abdomen is soft.  Nontender nondistended.  Bowel sounds are present normal.  No masses organomegaly Extremities: No edema.  Full range of motion of lower extremities. Neurologic: Alert and oriented x3.  No focal neurological deficits.     Data Reviewed: I have personally reviewed following labs and imaging studies  CBC: Recent Labs  Lab 02/23/24 1927 02/25/24 0919 02/26/24 0204  WBC 17.4* 8.3 9.3  HGB 12.1  12.0 12.2  HCT 37.2 35.9* 36.5  MCV 94.2 90.0 89.5  PLT 228 232 241   Basic Metabolic Panel: Recent Labs  Lab 02/23/24 1927 02/23/24 2242 02/25/24 0919 02/26/24 0204  NA 128*  --  130* 131*  K 4.0  --  2.9* 3.9  CL 92*  --  87* 94*  CO2 24  --  27 29  GLUCOSE 132*  --  215* 117*  BUN 36*  --  31* 32*  CREATININE 1.04*  --  1.37* 1.29*  CALCIUM  9.1  --  8.6* 8.5*  MG  --  1.9 1.8 1.9    GFR: Estimated Creatinine Clearance: 28.5 mL/min (A) (by C-G formula based on SCr of 1.29 mg/dL (H)). Liver Function Tests: Recent Labs  Lab 02/23/24 2242  AST 88*  ALT 136*  ALKPHOS 46  BILITOT 1.1  PROT 7.7  ALBUMIN 3.9   Thyroid  Function Tests: Recent Labs    02/23/24 2242  TSH 2.766     Radiology Studies: CARDIAC CATHETERIZATION Result Date: 02/26/2024 Coronary angiography 02/26/2024: LM: No significant disease LAD: Moderate calcification without any significant stenosis Lcx: Large dominant vessel.        Moderate calcification without any significant stenosis RCA: Small non dominant vessel. No significant disease LVEDP 23 mmHg Right heart catheterization 02/26/2024: RA: 7 mmHg RV: 38/4 mmHg PA: 39/17 mmHg, mPAP 29 mmHg PCW: 14 mmHg AO sats: 99% PA sats: 71% CO: 4.9 L/min CI: 2.9 L/min/m2 Coronary calcification without any significant obstructive coronary artery disease Mildly decompensated nonischemic cardiomyopathy [name], MD   ECHOCARDIOGRAM COMPLETE Result Date: 02/25/2024    ECHOCARDIOGRAM REPORT   Patient Name:   Kimberly Suarez Date of Exam: 02/25/2024 Medical Rec #:  [id number]        Height:       64.0 in Accession #:    [id number]       Weight:       145.1 lb Date of Birth:  09/30/1940         BSA:          1.707 m Patient Age:    83 years         BP:           135/63 mmHg Patient Gender: F                HR:           49 bpm. Exam Location:  Inpatient Procedure: 2D Echo, Cardiac Doppler, Color Doppler and Intracardiac            Opacification Agent (Both Spectral and Color Flow Doppler were            utilized during procedure). Indications:    R06.02 SOB  History:        Patient has prior history of Echocardiogram examinations, most                 recent 07/25/2023. CHF, Previous Myocardial Infarction, Abnormal                 ECG, Arrythmias:Atrial Fibrillation, Signs/Symptoms:Chest Pain;                 Risk Factors:Hypertension and Dyslipidemia.  Sonographer:     Ellouise Mose RDCS Referring Phys: [id number] PROSPER M AMPONSAH IMPRESSIONS  1. Left ventricular ejection fraction, by estimation, is 30 to 35%. The left ventricle has moderately decreased function. The left ventricle demonstrates global hypokinesis. Left ventricular diastolic parameters are  consistent with Grade I diastolic dysfunction (impaired relaxation).  2. Right ventricular systolic function is mildly reduced. The right ventricular size is normal. There is normal pulmonary artery systolic pressure. The estimated right ventricular systolic pressure is 23.2 mmHg.  3. The mitral valve is normal in structure. Trivial mitral valve regurgitation. No evidence of mitral stenosis.  4. The aortic valve is tricuspid. Aortic valve regurgitation is not visualized. No aortic stenosis is present. Comparison(s): The left ventricular function is significantly worse. FINDINGS  Left Ventricle: Left ventricular ejection fraction, by estimation, is 30 to 35%. The left ventricle has moderately decreased function. The left ventricle demonstrates global hypokinesis. Definity contrast agent was given IV to delineate the left ventricular endocardial borders. The left ventricular internal cavity size was normal in size. There is no left ventricular hypertrophy. Left ventricular diastolic parameters are consistent with Grade I diastolic dysfunction (impaired relaxation). Right Ventricle: The right ventricular size is normal. No increase in right ventricular wall thickness. Right ventricular systolic function is mildly reduced. There is normal pulmonary artery systolic pressure. The tricuspid regurgitant velocity is 2.25 m/s, and with an assumed right atrial pressure of 3 mmHg, the estimated right ventricular systolic pressure is 23.2 mmHg. Left Atrium: Left atrial size was normal in size. Right Atrium: Right atrial size was normal in size. Pericardium: There is no evidence of pericardial effusion. Mitral Valve: The mitral valve is normal in  structure. Trivial mitral valve regurgitation. No evidence of mitral valve stenosis. Tricuspid Valve: The tricuspid valve is normal in structure. Tricuspid valve regurgitation is mild . No evidence of tricuspid stenosis. Aortic Valve: The aortic valve is tricuspid. Aortic valve regurgitation is not visualized. No aortic stenosis is present. Pulmonic Valve: The pulmonic valve was normal in structure. Pulmonic valve regurgitation is trivial. No evidence of pulmonic stenosis. Aorta: The aortic root and ascending aorta are structurally normal, with no evidence of dilitation. IAS/Shunts: No atrial level shunt detected by color flow Doppler.  LEFT VENTRICLE PLAX 2D LVIDd:         4.10 cm     Diastology LVIDs:         3.70 cm     LV e' medial:    3.81 cm/s LV PW:         0.90 cm     LV E/e' medial:  21.5 LV IVS:        0.90 cm     LV e' lateral:   7.51 cm/s LVOT diam:     1.95 cm     LV E/e' lateral: 10.9 LV SV:         51 LV SV Index:   30 LVOT Area:     2.99 cm  LV Volumes (MOD) LV vol d, MOD A2C: 88.6 ml LV vol d, MOD A4C: 73.9 ml LV vol s, MOD A2C: 42.7 ml LV vol s, MOD A4C: 51.0 ml LV SV MOD A2C:     45.9 ml LV SV MOD A4C:     73.9 ml LV SV MOD BP:      34.9 ml RIGHT VENTRICLE             IVC RV S prime:     10.80 cm/s  IVC diam: 1.70 cm TAPSE (M-mode): 2.0 cm LEFT ATRIUM             Index        RIGHT ATRIUM           Index LA diam:  4.50 cm 2.64 cm/m   RA Area:     12.20 cm LA Vol (A2C):   36.2 ml 21.18 ml/m  RA Volume:   24.00 ml  14.06 ml/m LA Vol (A4C):   45.8 ml 26.83 ml/m LA Biplane Vol: 39.3 ml 23.03 ml/m  AORTIC VALVE LVOT Vmax:   63.40 cm/s LVOT Vmean:  41.400 cm/s LVOT VTI:    0.170 m  AORTA Ao Root diam: 3.10 cm Ao Asc diam:  3.20 cm MITRAL VALVE               TRICUSPID VALVE MV Area (PHT): 2.24 cm    TR Peak grad:   20.2 mmHg MV Decel Time: 338 msec    TR Vmax:        225.00 cm/s MV E velocity: 81.80 cm/s MV A velocity: 73.40 cm/s  SHUNTS MV E/A ratio:  1.11        Systemic VTI:  0.17 m                             Systemic Diam: 1.95 cm Aditya Sabharwal Electronically signed by Ria Commander Signature Date/Time: 02/25/2024/1:45:21 PM    Final      Scheduled Meds:  amiodarone  200 mg Oral BID   Followed by   NOREEN ON 03/10/2024] amiodarone  200 mg Oral Daily   apixaban   5 mg Oral BID   aspirin EC  81 mg Oral Daily   atorvastatin   40 mg Oral QHS   cholecalciferol  1,000 Units Oral QHS   gabapentin   300 mg Oral QHS   sodium chloride  flush  3 mL Intravenous Q12H   Continuous Infusions:  sodium chloride        LOS: 3 days    Joette Pebbles,  [facility]  If 7PM-7AM, please contact night-coverage [email or web address]  02/26/2024, 10:54 AM

## 2024-02-26 NOTE — Progress Notes (Addendum)
 Progress Note  Patient Name: Kimberly Suarez Date of Encounter: 02/26/2024  [facility] Cardiologist: Lynwood Schilling, MD   Interval Summary   Patient doing well post-cath, right radial site looks good, no bleeding or hematoma Denies any symptoms at this time, just tired  Vital Signs Vitals:   02/26/24 0801 02/26/24 0806 02/26/24 0811 02/26/24 0828  BP: (!) 151/72 (!) 147/65 (!) 144/64 (!) 138/55  Pulse: (!) 55 (!) 54 (!) 53   Resp: 20 (!) 21 19 14   Temp:    (!) 97.5 F (36.4 C)  TempSrc:    Oral  SpO2: 96% 96% 96%   Weight:      Height:       Intake/Output Summary (Last 24 hours) at 02/26/2024 1011 Last data filed at 02/26/2024 0958 Gross per 24 hour  Intake 1166.3 ml  Output 2150 ml  Net -983.7 ml      02/26/2024    1:41 AM 02/25/2024    3:49 AM 02/24/2024   10:53 AM  Last 3 Weights  Weight (lbs) 140 lb 6.9 oz 145 lb 1 oz 141 lb 8.6 oz  Weight (kg) 63.7 kg 65.8 kg 64.2 kg     Telemetry/ECG  Sinus bradycardia, HR 50s - Personally Reviewed  Physical Exam  GEN: No acute distress.   Neck: No JVD Cardiac: bradycardic, no murmurs, rubs, or gallops.  Respiratory: Clear to auscultation bilaterally. GI: Soft, nontender, non-distended  MS: No edema  Assessment & Plan  83 y.o. female with a hx of HTN, HLD, Stage III CKD, carotid artery stenosis (dopplers in 12/2023 showed 1-39% RICA and 40-59% LICA stenoses) and paroxysmal atrial fibrillation who is being seen for dyspnea and decompensated heart failure    Acute HFrEF Non-ischemic cardiomyopathy  Presented to ED with shortness of breath, LE edema, orthopnea BNP 1,329 CXR showed cardiomegaly, edema vs infection, trace effusions  Echo 06/2023: LVEF 55-60%, G1DD, normal RV function, normal IVC  Echo this admission showed: LVEF 30-35%, global hypokinesis, G1DD, mildly reduced RV function Started on IV Lasix.   Net -6.7 L this admission  Most recent BP 138/55, HR 53 RHC/LHC: non-obstructive CAD, mildly decompensated  non-ischemic cardiomyopathy  Renal function trending down, creatinine 1.29 today from 1.37 Holding beta-blocker due to bradycardia Decreased EF could be secondary to frequent episodes of rapid A. Fib will continue with amiodarone to attempt to keep her in normal rhythm  Consider re-checking TTE in a few months to see if EF improves, will discuss with MD   Elevated troponin level  223 > 337 > 321 EKG showed no acute ischemic changes Some reported episodes of chest pain prior to admission Unclear if troponin elevation 2/2 to CHF, A. Fib with RVR or ACS Echo showed: LVEF 30-35%, global hypokinesis, G1DD, mildly reduced RV function Underwent RHC/LHC: non-obstructive CAD, mildly decompensated non-ischemic cardiomyopathy    Atrial fibrillation with RVR  Known history of PAF on monitor 07/2023 with 2% burden, average HR 130s, up to 184 bpm when in AF Asymptomatic when in A. Fib even with high rates When in sinus, rates 40-50s therefore would not tolerate higher dose of BB  Currently in sinus with HR 50s  Most recent BP 138/55, HR 53 Continue PO amiodarone 200 mg BID x 2 weeks then 200 mg daily  Holding beta-blocker due to bradycardia  Okay to resume Eliquis  tonight    Hyperlipidemia  10/26/2023: HDL 43; LDL Chol Calc (NIH) 80 02/23/2024: ALT 136  Continue Lipitor 40 mg daily  For questions or updates, please contact [facility] Please consult [email or web address] for contact info under       Signed, Waddell DELENA Donath, PA-C   Patient seen and examined.  Agree with above documentation.  On exam, patient is alert and oriented, bradycardic, regular, no murmurs, lungs CTAB, no LE edema or JVD.  Cath today showed nonobstructive CAD.  RHC showed RA 7, RV 38/4, PA 39/17/29, PCWP 14, PA sat 71%, CI 2.9.  Unclear etiology of her nonischemic cardiomyopathy, could be secondary to A-fib.  Will continue amiodarone and plan recheck echocardiogram in 3 months of maintaining sinus rhythm.  Will add  GDMT as tolerated, would hold off for today given renal function.  Can restart Eliquis  tonight.  Lonni LITTIE Nanas, MD

## 2024-02-26 NOTE — Progress Notes (Signed)
   Heart Failure Stewardship Pharmacist Progress Note   PCP: Jolinda Norene HERO, DO PCP-Cardiologist: Lynwood Schilling, MD    HPI:  83 yo F with PMH of CHF, HTN, HLD, afib, CKD, arthritis, IBS, skin cancer, neuropathy, and GERD.   Presented to the ED on 6/28 with shortness of breath, orthopnea, and chest pain. CXR with cardiomegaly, edema vs infection, and trace pleural effusions. BNP elevated 1329. ECHO 6/30 with LVEF 30-35% (was 55-60% in 06/2023), global hypokinesis, G1DD, RV mildly reduced. R/LHC on 7/1 with coronary calcification without any significant obstructive CAD. RA 7, PA 29, wedge 14, CO 4.9, CI 2.9.   Met with patient and her granddaughter at bedside. States she is feeling well. No shortness of breath or LE edema. Does not have any questions from her cath results later today. States she has taken the enalapril  for years but thinks she was only taking it once daily. She can afford her Eliquis  copay but if additional medications are started that are also $47 per month she would need assistance with this.   Agreeable to using [facility] TOC pharmacy at discharge. She normally uses [facility] mail order for her generic medications. Fills Eliquis  with [facility] since this is the only medication with a copay. Interested in switching her [facility] pharmacy to [facility] order, especially if other medications requiring a grant are started.   Current HF Medications: None  Prior to admission HF Medications: Beta blocker: metoprolol  XL 25 mg daily ACE/ARB/ARNI: enalapril  10 mg BID - patient may have been only taking this once daily  Pertinent Lab Values: Serum creatinine 1.29, BUN 32, Potassium 3.9, Sodium 131, BNP 1329.6, Magnesium 1.9   Vital Signs: Weight: 140 lbs (admission weight: 145 lbs) Blood pressure: 130/70s  Heart rate: 50s  I/O: net -1.9L yesterday; net -6.3L since admission  Medication Assistance / Insurance Benefits Check: Does the patient have prescription insurance?  Yes Type of  insurance plan: UHC Medicare  Does the patient qualify for medication assistance through manufacturers or grants?   Yes Eligible grants and/or patient assistance programs: [employer] grant Medication assistance applications in progress: none Medication assistance applications approved: none Approved medication assistance renewals will be completed by: pending  Outpatient Pharmacy:  Prior to admission outpatient pharmacy: [facility] Is the patient willing to use [facility] TOC pharmacy at discharge? Yes Is the patient willing to transition their outpatient pharmacy to utilize a [facility] outpatient pharmacy?   Yes    Assessment: 1. Acute on chronic systolic CHF (LVEF 30-35%), due to NICM - thought to be primarily due to afib. NYHA class II symptoms. - Holding IV lasix with creatinine bump. RHC with mildly elevated filling pressures. Does not appear fluid overloaded on exam.  - Holding BB due to bradycardia - Consider starting Entresto 24/26 mg BID tomorrow if creatinine improved - Can consider adding spironolactone + SGLT2i if BP does not allow for Entresto titration   Plan: 1) Medication changes recommended at this time: - Pending renal function tomorrow, start Entresto 24/26 mg BID  2) Patient assistance: [employer] copay $47 - Farxiga/Jardiance copay $47 - Would need [employer] grant for cost assistance  3)  Education  - Initial education complete - Full education to be completed prior to discharge  Duwaine Plant, PharmD, BCPS Heart Failure Engineer, building services Phone [phone]

## 2024-02-26 NOTE — Progress Notes (Signed)
 PHARMACY - ANTICOAGULATION CONSULT NOTE  Pharmacy Consult for heparin Indication: chest pain/ACS/Afib  No Known Allergies  Patient Measurements: Height: 5' 4 (162.6 cm) Weight: 63.7 kg (140 lb 6.9 oz) IBW/kg (Calculated) : 54.7 HEPARIN DW (KG): 64.2  Vital Signs: Temp: 97.5 F (36.4 C) (07/01 0828) Temp Source: Oral (07/01 0828) BP: 138/55 (07/01 0828) Pulse Rate: 53 (07/01 0811)  Labs: Recent Labs    02/23/24 1927 02/23/24 2242 02/23/24 2242 02/24/24 0221 02/24/24 2229 02/25/24 9147 02/25/24 0919 02/26/24 0204  HGB 12.1  --   --   --   --   --  12.0 12.2  HCT 37.2  --   --   --   --   --  35.9* 36.5  PLT 228  --   --   --   --   --  232 241  APTT  --  32   < >  --  45* 68*  --  73*  HEPARINUNFRC  --  >1.10*   < >  --  1.08* 0.74*  --  0.95*  CREATININE 1.04*  --   --   --   --   --  1.37* 1.29*  TROPONINIHS 223* 337*  --  321*  --   --   --   --    < > = values in this interval not displayed.    Estimated Creatinine Clearance: 28.5 mL/min (A) (by C-G formula based on SCr of 1.29 mg/dL (H)).   Medical History: Past Medical History:  Diagnosis Date   Allergy    Arthritis    ASCUS (atypical squamous cells of undetermined significance) on Pap smear 2011x2   Negative high-risk HPV, normal Pap smears 2012/2013   Atrial fibrillation (HCC)    Atypical nevus 05/02/2007   right back-moderate    Cancer (HCC)    Cataract    Diverticulosis    GERD (gastroesophageal reflux disease)    Hiatal hernia    Hyperlipemia    Hypertension    IBS (irritable bowel syndrome)    Kidney disease    Lymphocytic colitis 2007   Neuropathy    SCC (squamous cell carcinoma) 01/19/2006   bridge of nose (CX35FU)   SCC (squamous cell carcinoma) 03/08/2017   right inner lower shin (CX35FU)   SCC (squamous cell carcinoma) x 2 02/24/2002   right bridge of nose (Cx35FU),right bridge of nose   Uterine prolapse    Pessary     Assessment: 83 yo female presents with chest pressure,  on apixaban  LD 2/28 @ 1430. Pharmacy consulted to dose IV heparin   aPTT this morning came back therapeutic at 73, on heparin infusion at 1100 units/hr (heparin level still slightly falsely elevated at 0.95 2/t recent DOAC). CBC stable. No s/sx of bleeding or infusion issues. - Heparin is on hold currently as patient is going for cardiac catheterization. Per PA, patient will likely resume eliquis  post cath.  Goal of Therapy:  Heparin level 0.3-0.7 units/ml aPTT 66-102 seconds Monitor platelets by anticoagulation protocol: Yes   Plan:  -Continue heparin infusion at 1100 units/hr  -Daily aPTT, heparin level until correlate and CBC -Follow up post cardiac catheterization anticoagulation plans  Thank you for allowing pharmacy to participate in this patient's care,  Koren Or, PharmD Clinical Pharmacist 02/26/2024 9:02 AM Please check AMION for all [facility] Pharmacy numbers

## 2024-02-26 NOTE — Progress Notes (Addendum)
 TR band removed with no complication. No sign of bleeding, no hematoma. Sterile transparent dressing applied.

## 2024-02-26 NOTE — Progress Notes (Signed)
 Mobility Specialist Progress Note:    02/26/24 1045  Mobility  Activity Ambulated independently in hallway  Level of Assistance Standby assist, set-up cues, supervision of patient - no hands on  Assistive Device None  Distance Ambulated (ft) 500 ft  Activity Response Tolerated well  Mobility Referral Yes  Mobility visit 1 Mobility  Mobility Specialist Start Time (ACUTE ONLY) 1045  Mobility Specialist Stop Time (ACUTE ONLY) 1055  Mobility Specialist Time Calculation (min) (ACUTE ONLY) 10 min   Pt agreeable and happy for session. Pt moves well and no c/o symptoms. Worries about SOB when returning to normal life but is showing no symptoms otherwise. Left pt on EOB w/ her daughter about to have breakfast and all needs met.   Therisa Rana Mobility Specialist Please contact via SecureChat or  Rehab office at [phone]

## 2024-02-26 NOTE — TOC CM/SW Note (Addendum)
 Transition of Care [facility]) - Inpatient Brief Assessment   Patient Details  Name: Kimberly Suarez MRN: [id number] Date of Birth: [birth year]  Transition of Care [facility]) CM/SW Contact:    Waddell Barnie Rama, RN Phone Number: 02/26/2024, 3:49 PM   Clinical Narrative: From home alone, has PCP and insurance on file, states has no HH services in place at this time or DME at home.  States family member will transport them home at [employer] and family is support system, states gets medications from [facility] in [place].  Pta self ambulatory.  She eats a low sodium diet, she uses no salt seasoning, and she has a scale at home.  Patient gives this NCM permission to speak to her daughter and sons.  Per PT eval rec outpt PT,  patient states she goes to a free gym  and she would like to continue that instead.  If she needs to go to outpt she will get her PCP to set it up for her at her  gym.   Transition of Care Asessment: Insurance and Status: Insurance coverage has been reviewed Patient has primary care physician: Yes Home environment has been reviewed: home alone Prior level of function:: indep Prior/Current Home Services: No current home services Social Drivers of Health Review: SDOH reviewed no interventions necessary Readmission risk has been reviewed: Yes Transition of care needs: transition of care needs identified, TOC will continue to follow

## 2024-02-26 NOTE — Interval H&P Note (Signed)
 History and Physical Interval Note:  02/26/2024 7:34 AM  Kimberly Suarez  has presented today for surgery, with the diagnosis of heart failure.  The various methods of treatment have been discussed with the patient and family. After consideration of risks, benefits and other options for treatment, the patient has consented to  Procedure(s): RIGHT/LEFT HEART CATH AND CORONARY ANGIOGRAPHY (N/A) as a surgical intervention.  The patient's history has been reviewed, patient examined, no change in status, stable for surgery.  I have reviewed the patient's chart and labs.  Questions were answered to the patient's satisfaction.     Karalyn Kadel J Lowanda Cashaw

## 2024-02-27 DIAGNOSIS — I5041 Acute combined systolic (congestive) and diastolic (congestive) heart failure: Secondary | ICD-10-CM | POA: Diagnosis not present

## 2024-02-27 DIAGNOSIS — I480 Paroxysmal atrial fibrillation: Secondary | ICD-10-CM | POA: Diagnosis not present

## 2024-02-27 DIAGNOSIS — I4891 Unspecified atrial fibrillation: Secondary | ICD-10-CM | POA: Diagnosis not present

## 2024-02-27 DIAGNOSIS — R7989 Other specified abnormal findings of blood chemistry: Secondary | ICD-10-CM | POA: Diagnosis not present

## 2024-02-27 DIAGNOSIS — I509 Heart failure, unspecified: Secondary | ICD-10-CM | POA: Diagnosis not present

## 2024-02-27 HISTORY — DX: Paroxysmal atrial fibrillation: I48.0

## 2024-02-27 LAB — CBC
HCT: 39.1 % (ref 36.0–46.0)
Hemoglobin: 13 g/dL (ref 12.0–15.0)
MCH: 29.8 pg (ref 26.0–34.0)
MCHC: 33.2 g/dL (ref 30.0–36.0)
MCV: 89.7 fL (ref 80.0–100.0)
Platelets: 264 10*3/uL (ref 150–400)
RBC: 4.36 MIL/uL (ref 3.87–5.11)
RDW: 13.8 % (ref 11.5–15.5)
WBC: 9 10*3/uL (ref 4.0–10.5)
nRBC: 0 % (ref 0.0–0.2)

## 2024-02-27 LAB — BASIC METABOLIC PANEL WITH GFR
Anion gap: 9 (ref 5–15)
BUN: 29 mg/dL — ABNORMAL HIGH (ref 8–23)
CO2: 26 mmol/L (ref 22–32)
Calcium: 8.5 mg/dL — ABNORMAL LOW (ref 8.9–10.3)
Chloride: 98 mmol/L (ref 98–111)
Creatinine, Ser: 1.35 mg/dL — ABNORMAL HIGH (ref 0.44–1.00)
GFR, Estimated: 39 mL/min — ABNORMAL LOW
Glucose, Bld: 128 mg/dL — ABNORMAL HIGH (ref 70–99)
Potassium: 4.2 mmol/L (ref 3.5–5.1)
Sodium: 133 mmol/L — ABNORMAL LOW (ref 135–145)

## 2024-02-27 MED ORDER — AMIODARONE HCL IN DEXTROSE 360-4.14 MG/200ML-% IV SOLN
60.0000 mg/h | INTRAVENOUS | Status: AC
  Administered 2024-02-27 (×2): 60 mg/h via INTRAVENOUS

## 2024-02-27 MED ORDER — AMIODARONE HCL IN DEXTROSE 360-4.14 MG/200ML-% IV SOLN
30.0000 mg/h | INTRAVENOUS | Status: DC
  Administered 2024-02-27: 30 mg/h via INTRAVENOUS
  Filled 2024-02-27 (×2): qty 200

## 2024-02-27 NOTE — Progress Notes (Signed)
 PROGRESS NOTE    Kimberly Suarez  FMW:[id number] DOB: November 20, 1940 DOA: 02/23/2024 PCP: Jolinda Norene HERO, DO   Brief Narrative:  Kimberly Suarez is a 83 y.o. female with medical history significant for congestive heart failure, HTN, HLD, A-fib on Eliquis , arthritis, IBS, skin cancer, neuropathy and GERD who presented to the ED for evaluation of shortness of breath and chest pain.   Assessment & Plan:   Acute combined systolic and diastolic congestive heart failure Last TTE on 06/2023 shows EF 55 to 60%, G1DD but no valvular abnormalities Echocardiogram from this admission showed LVEF of 30 to 35% with treatment diastolic dysfunction.  Right ventricular systolic function was mildly reduced.  No significant valvular abnormalities noted. Patient underwent cardiac catheterization which did not reveal significant obstruction. Diuretics on hold due to increasing creatinine. Cardiology continues to follow. Continue strict ins and outs and daily weights. GDMT per cardiology. Volume status stable.  Atrial fibrillation with RVR  Patient on apixaban .  Was on metoprolol  which had to be held due to significant bradycardia.  Patient subsequently placed on amiodarone.  Noted to have RVR overnight.  Cardiology to initiate amiodarone infusion today.   Patient remains asymptomatic.  Elevated troponin Rule out ACS versus demand ischemia Noted to have elevated troponin levels.  EKG did not show any obvious ischemic changes. Patient was started on IV heparin infusion.  Aspirin was initiated. Seen by cardiology and underwent cardiac cath which did not reveal significant CAD.   Hyponatremia Probably due to hypovolemia.  Stable.  Acute kidney injury on chronic kidney disease stage IIIb Baseline creatinine around 1.2.  Slight increase in creatinine noted during this admission.  Stable for the most part.  Continue to monitor.  Avoid nephrotoxic agents.   HLD Continue atorvastatin     Neuropathy Continue gabapentin   DVT prophylaxis: On apixaban  Code Status: Full code Family Communication: Daughter at bedside Disposition: Hopefully return home when improved.   Consultants:  Cardiology  Procedures:  Echocardiogram Cardiac catheterization  Subjective: Patient denies any chest pain shortness of breath.  Wants to go home.  No nausea or vomiting.  Daughter is at the bedside.  Objective: Vitals:   02/26/24 2353 02/26/24 2355 02/27/24 0421 02/27/24 0741  BP: 108/76 108/76 109/74 118/64  Pulse: (!) 108 (!) 104 75 77  Resp: 18  18 20   Temp: 98.4 F (36.9 C)  97.8 F (36.6 C) 97.7 F (36.5 C)  TempSrc: Oral  Oral Oral  SpO2: 96% 98% 100% 97%  Weight:   63.5 kg   Height:        Intake/Output Summary (Last 24 hours) at 02/27/2024 1049 Last data filed at 02/27/2024 0742 Gross per 24 hour  Intake 1830 ml  Output 1650 ml  Net 180 ml   Filed Weights   02/25/24 0349 02/26/24 0141 02/27/24 0421  Weight: 65.8 kg 63.7 kg 63.5 kg    Examination:  General appearance: Awake alert.  In no distress Resp: Clear to auscultation bilaterally.  Normal effort Cardio: S1-S2 is irregularly irregular GI: Abdomen is soft.  Nontender nondistended.  Bowel sounds are present normal.  No masses organomegaly Extremities: No edema.  Full range of motion of lower extremities. Neurologic: Alert and oriented x3.  No focal neurological deficits.    Data Reviewed:   CBC: Recent Labs  Lab 02/23/24 1927 02/25/24 0919 02/26/24 0204 02/26/24 0754 02/26/24 0758 02/27/24 0225  WBC 17.4* 8.3 9.3  --   --  9.0  HGB 12.1 12.0 12.2 11.6*  11.9*  12.6 13.0  HCT 37.2 35.9* 36.5 34.0*  35.0* 37.0 39.1  MCV 94.2 90.0 89.5  --   --  89.7  PLT 228 232 241  --   --  264   Basic Metabolic Panel: Recent Labs  Lab 02/23/24 1927 02/23/24 2242 02/25/24 0919 02/26/24 0204 02/26/24 0754 02/26/24 0758 02/27/24 0225  NA 128*  --  130* 131* 132*  135 134* 133*  K 4.0  --  2.9* 3.9 3.7   3.9 4.2 4.2  CL 92*  --  87* 94*  --   --  98  CO2 24  --  27 29  --   --  26  GLUCOSE 132*  --  215* 117*  --   --  128*  BUN 36*  --  31* 32*  --   --  29*  CREATININE 1.04*  --  1.37* 1.29*  --   --  1.35*  CALCIUM  9.1  --  8.6* 8.5*  --   --  8.5*  MG  --  1.9 1.8 1.9  --   --   --    GFR: Estimated Creatinine Clearance: 27.3 mL/min (A) (by C-G formula based on SCr of 1.35 mg/dL (H)). Liver Function Tests: Recent Labs  Lab 02/23/24 2242  AST 88*  ALT 136*  ALKPHOS 46  BILITOT 1.1  PROT 7.7  ALBUMIN 3.9    Radiology Studies: CARDIAC CATHETERIZATION Result Date: 02/26/2024 Coronary angiography 02/26/2024: LM: No significant disease LAD: Moderate calcification without any significant stenosis Lcx: Large dominant vessel.        Moderate calcification without any significant stenosis RCA: Small non dominant vessel. No significant disease LVEDP 23 mmHg Right heart catheterization 02/26/2024: RA: 7 mmHg RV: 38/4 mmHg PA: 39/17 mmHg, mPAP 29 mmHg PCW: 14 mmHg AO sats: 99% PA sats: 71% CO: 4.9 L/min CI: 2.9 L/min/m2 Coronary calcification without any significant obstructive coronary artery disease Mildly decompensated nonischemic cardiomyopathy Kimberly Kimberly Suarez Lawrence, MD   ECHOCARDIOGRAM COMPLETE Result Date: 02/25/2024    ECHOCARDIOGRAM REPORT   Patient Name:   Kimberly Suarez Date of Exam: 02/25/2024 Medical Rec #:  [id number]        Height:       64.0 in Accession #:    [id number]       Weight:       145.1 lb Date of Birth:  [birth year]         BSA:          1.707 m Patient Age:    83 years         BP:           135/63 mmHg Patient Gender: F                HR:           49 bpm. Exam Location:  Inpatient Procedure: 2D Echo, Cardiac Doppler, Color Doppler and Intracardiac            Opacification Agent (Both Spectral and Color Flow Doppler were            utilized during procedure). Indications:    R06.02 SOB  History:        Patient has prior history of Echocardiogram examinations, most                  recent 07/25/2023. CHF, Previous Myocardial Infarction, Abnormal  ECG, Arrythmias:Atrial Fibrillation, Signs/Symptoms:Chest Pain;                 Risk Factors:Hypertension and Dyslipidemia.  Sonographer:    Kimberly Suarez RDCS Referring Phys: [id number] Kimberly Suarez IMPRESSIONS  1. Left ventricular ejection fraction, by estimation, is 30 to 35%. The left ventricle has moderately decreased function. The left ventricle demonstrates global hypokinesis. Left ventricular diastolic parameters are consistent with Grade I diastolic dysfunction (impaired relaxation).  2. Right ventricular systolic function is mildly reduced. The right ventricular size is normal. There is normal pulmonary artery systolic pressure. The estimated right ventricular systolic pressure is 23.2 mmHg.  3. The mitral valve is normal in structure. Trivial mitral valve regurgitation. No evidence of mitral stenosis.  4. The aortic valve is tricuspid. Aortic valve regurgitation is not visualized. No aortic stenosis is present. Comparison(s): The left ventricular function is significantly worse. FINDINGS  Left Ventricle: Left ventricular ejection fraction, by estimation, is 30 to 35%. The left ventricle has moderately decreased function. The left ventricle demonstrates global hypokinesis. Definity contrast agent was given IV to delineate the left ventricular endocardial borders. The left ventricular internal cavity size was normal in size. There is no left ventricular hypertrophy. Left ventricular diastolic parameters are consistent with Grade I diastolic dysfunction (impaired relaxation). Right Ventricle: The right ventricular size is normal. No increase in right ventricular wall thickness. Right ventricular systolic function is mildly reduced. There is normal pulmonary artery systolic pressure. The tricuspid regurgitant velocity is 2.25 m/s, and with an assumed right atrial pressure of 3 mmHg, the estimated right ventricular systolic  pressure is 23.2 mmHg. Left Atrium: Left atrial size was normal in size. Right Atrium: Right atrial size was normal in size. Pericardium: There is no evidence of pericardial effusion. Mitral Valve: The mitral valve is normal in structure. Trivial mitral valve regurgitation. No evidence of mitral valve stenosis. Tricuspid Valve: The tricuspid valve is normal in structure. Tricuspid valve regurgitation is mild . No evidence of tricuspid stenosis. Aortic Valve: The aortic valve is tricuspid. Aortic valve regurgitation is not visualized. No aortic stenosis is present. Pulmonic Valve: The pulmonic valve was normal in structure. Pulmonic valve regurgitation is trivial. No evidence of pulmonic stenosis. Aorta: The aortic root and ascending aorta are structurally normal, with no evidence of dilitation. IAS/Shunts: No atrial level shunt detected by color flow Doppler.  LEFT VENTRICLE PLAX 2D LVIDd:         4.10 cm     Diastology LVIDs:         3.70 cm     LV e' medial:    3.81 cm/s LV PW:         0.90 cm     LV E/e' medial:  21.5 LV IVS:        0.90 cm     LV e' lateral:   7.51 cm/s LVOT diam:     1.95 cm     LV E/e' lateral: 10.9 LV SV:         51 LV SV Index:   30 LVOT Area:     2.99 cm  LV Volumes (MOD) LV vol d, MOD A2C: 88.6 ml LV vol d, MOD A4C: 73.9 ml LV vol s, MOD A2C: 42.7 ml LV vol s, MOD A4C: 51.0 ml LV SV MOD A2C:     45.9 ml LV SV MOD A4C:     73.9 ml LV SV MOD BP:      34.9 ml RIGHT VENTRICLE  IVC RV S prime:     10.80 cm/s  IVC diam: 1.70 cm TAPSE (M-mode): 2.0 cm LEFT ATRIUM             Index        RIGHT ATRIUM           Index LA diam:        4.50 cm 2.64 cm/m   RA Area:     12.20 cm LA Vol (A2C):   36.2 ml 21.18 ml/m  RA Volume:   24.00 ml  14.06 ml/m LA Vol (A4C):   45.8 ml 26.83 ml/m LA Biplane Vol: 39.3 ml 23.03 ml/m  AORTIC VALVE LVOT Vmax:   63.40 cm/s LVOT Vmean:  41.400 cm/s LVOT VTI:    0.170 m  AORTA Ao Root diam: 3.10 cm Ao Asc diam:  3.20 cm MITRAL VALVE               TRICUSPID  VALVE MV Area (PHT): 2.24 cm    TR Peak grad:   20.2 mmHg MV Decel Time: 338 msec    TR Vmax:        225.00 cm/s MV E velocity: 81.80 cm/s MV A velocity: 73.40 cm/s  SHUNTS MV E/A ratio:  1.11        Systemic VTI:  0.17 m                            Systemic Diam: 1.95 cm Aditya Sabharwal Electronically signed by Ria Commander Signature Date/Time: 02/25/2024/1:45:21 PM    Final      Scheduled Meds:  apixaban   5 mg Oral BID   atorvastatin   40 mg Oral QHS   cholecalciferol  1,000 Units Oral QHS   gabapentin   300 mg Oral QHS   sodium chloride  flush  3 mL Intravenous Q12H   Continuous Infusions:  amiodarone     amiodarone       LOS: 4 days    Joette Pebbles,  [facility]  If 7PM-7AM, please contact night-coverage [email or web address]  02/27/2024, 10:49 AM

## 2024-02-27 NOTE — Progress Notes (Signed)
 Patient in and out of afib with rate range 90-120s, patient denies symptoms.

## 2024-02-27 NOTE — Progress Notes (Signed)
 Physical Therapy Treatment Patient Details Name: Kimberly Suarez MRN: [id number] DOB: April 24, 1941 Today's Date: 02/27/2024   History of Present Illness Pt is a 83 y.o female admitted 6/28 for chest pains. Dx w/ pneumonia a few weeks ago. Elevated troponins, CHF exacerbations.PMH: a-fib, HTN, HLD, cataracts, CKD    PT Comments  The pt was agreeable to session with focus on progression of mobility, balance, and LE strengthening. She tolerated progression of ambulation well, but was dependent on UE support via IV pole for ambulation due to slight instability and sway initially. The pt tolerated balance challenges with mild sway, slowed speed, and CGA. She continues to report sit-stand transfers as most challenging due to LE weakness, completed 3 x 5 sets of sit-stand from various bed heights to challenge LE. Pt reports no pain in knees, only difficulty due to LE weakness, especially with eccentric lower. Recommendations remain appropriate, will continue efforts acutely.     If plan is discharge home, recommend the following: A little help with walking and/or transfers;A little help with bathing/dressing/bathroom;Assistance with cooking/housework;Assist for transportation;Help with stairs or ramp for entrance   Can travel by private vehicle        Equipment Recommendations  None recommended by PT    Recommendations for Other Services       Precautions / Restrictions Precautions Precautions: None Recall of Precautions/Restrictions: Intact Restrictions Weight Bearing Restrictions Per Provider Order: No     Mobility  Bed Mobility Overal bed mobility: Needs Assistance Bed Mobility: Supine to Sit     Supine to sit: Supervision     General bed mobility comments: supervision, HOB was elevated but pt likely could do without    Transfers Overall transfer level: Needs assistance Equipment used: None (IV pole) Transfers: Sit to/from Stand, Bed to chair/wheelchair/BSC Sit to Stand:  Supervision   Step pivot transfers: Supervision       General transfer comment: Use of IV pole to pull to standing. pt reports limited by pain. reaching for UE support when standing    Ambulation/Gait Ambulation/Gait assistance: Contact guard assist Gait Distance (Feet): 400 Feet Assistive device: None, IV Pole Gait Pattern/deviations: Step-through pattern, Decreased stride length Gait velocity: 0.56 m/s     General Gait Details: pt with intermittent UE support on hallway rail, initially slowed speed with poor stability, improved with mobility. single LOB with turning and reaching outside of BOS   Stairs             Wheelchair Mobility     Tilt Bed    Modified Rankin (Stroke Patients Only)       Balance Overall balance assessment: Needs assistance Sitting-balance support: No upper extremity supported, Feet supported Sitting balance-Leahy Scale: Good     Standing balance support: Single extremity supported, During functional activity Standing balance-Leahy Scale: Fair Standing balance comment: single LOB with turning to reach outside BOS                 Standardized Balance Assessment Standardized Balance Assessment : Dynamic Gait Index   Dynamic Gait Index Level Surface: Mild Impairment Change in Gait Speed: Mild Impairment Gait with Horizontal Head Turns: Mild Impairment Gait with Vertical Head Turns: Mild Impairment Gait and Pivot Turn: Mild Impairment Step Over Obstacle: Mild Impairment Step Around Obstacles: Mild Impairment Steps: Mild Impairment Total Score: 16      Communication Communication Communication: No apparent difficulties  Cognition Arousal: Alert Behavior During Therapy: WFL for tasks assessed/performed   PT - Cognitive impairments: No apparent impairments  PT - Cognition Comments: WFL for session, not formally assessed Following commands: Intact      Cueing Cueing Techniques: Verbal  cues  Exercises Other Exercises Other Exercises: 5x sit-stand from 3 different bed heights. single UE support and CGA    General Comments        Pertinent Vitals/Pain Pain Assessment Pain Assessment: No/denies pain Pain Intervention(s): Monitored during session     PT Goals (current goals can now be found in the care plan section) Acute Rehab PT Goals Patient Stated Goal: improve strength in BLE to prepare for knee surgery PT Goal Formulation: With patient Time For Goal Achievement: 03/10/24 Potential to Achieve Goals: Good Progress towards PT goals: Progressing toward goals    Frequency    Min 2X/week       AM-PAC PT 6 Clicks Mobility   Outcome Measure  Help needed turning from your back to your side while in a flat bed without using bedrails?: None Help needed moving from lying on your back to sitting on the side of a flat bed without using bedrails?: None Help needed moving to and from a bed to a chair (including a wheelchair)?: A Little Help needed standing up from a chair using your arms (e.g., wheelchair or bedside chair)?: A Little Help needed to walk in hospital room?: A Little Help needed climbing 3-5 steps with a railing? : A Little 6 Click Score: 20    End of Session Equipment Utilized During Treatment: Gait belt Activity Tolerance: Patient tolerated treatment well Patient left: in bed;with call bell/phone within reach;with family/visitor present Nurse Communication: Mobility status PT Visit Diagnosis: Unsteadiness on feet (R26.81);Muscle weakness (generalized) (M62.81);Pain Pain - Right/Left: Right Pain - part of body: Knee     Time: 8651-8591 PT Time Calculation (min) (ACUTE ONLY): 20 min  Charges:    $Therapeutic Exercise: 8-22 mins PT General Charges $$ ACUTE PT VISIT: 1 Visit                     Izetta Call, PT, DPT   Acute Rehabilitation Department Office [phone] Secure Chat Communication Preferred   Izetta JULIANNA Call 02/27/2024,  3:02 PM

## 2024-02-27 NOTE — Progress Notes (Signed)
 Progress Note  Patient Name: Kimberly Suarez Date of Encounter: 02/27/2024 [facility] Cardiologist: Lynwood Schilling, MD   Interval Summary   BP 118/64.  Creatinine stable at 1.35.  Denies any chest pain or dyspnea  Vital Signs Vitals:   02/26/24 2353 02/26/24 2355 02/27/24 0421 02/27/24 0741  BP: 108/76 108/76 109/74 118/64  Pulse: (!) 108 (!) 104 75 77  Resp: 18  18 20   Temp: 98.4 F (36.9 C)  97.8 F (36.6 C) 97.7 F (36.5 C)  TempSrc: Oral  Oral Oral  SpO2: 96% 98% 100% 97%  Weight:   63.5 kg   Height:       Intake/Output Summary (Last 24 hours) at 02/27/2024 1012 Last data filed at 02/27/2024 0742 Gross per 24 hour  Intake 1830 ml  Output 1650 ml  Net 180 ml      02/27/2024    4:21 AM 02/26/2024    1:41 AM 02/25/2024    3:49 AM  Last 3 Weights  Weight (lbs) 139 lb 15.9 oz 140 lb 6.9 oz 145 lb 1 oz  Weight (kg) 63.5 kg 63.7 kg 65.8 kg     Telemetry/ECG  Sinus bradycardia, HR 50s - Personally Reviewed  Physical Exam  GEN: No acute distress.   Neck: No JVD Cardiac: bradycardic, no murmurs, rubs, or gallops.  Respiratory: Clear to auscultation bilaterally. GI: Soft, nontender, non-distended  MS: No edema  Assessment & Plan  83 y.o. female with a hx of HTN, HLD, Stage III CKD, carotid artery stenosis (dopplers in 12/2023 showed 1-39% RICA and 40-59% LICA stenoses) and paroxysmal atrial fibrillation who is being seen for dyspnea and decompensated heart failure    Atrial fibrillation with RVR  Known history of PAF on monitor 07/2023 with 2% burden, average HR 130s, up to 184 bpm when in AF Asymptomatic when in A. Fib even with high rates When in sinus, rates 40-50s therefore would not tolerate higher dose of BB  She went back into A-fib with RVR overnight.  Rates 80s to 130s Will start amiodarone drip Holding beta-blocker due to bradycardia when in sinus  Continue Eliquis     Acute HFrEF Non-ischemic cardiomyopathy  Presented to ED with shortness of  breath, LE edema, orthopnea BNP 1,329 CXR showed cardiomegaly, edema vs infection, trace effusions  Echo 06/2023: LVEF 55-60%, G1DD, normal RV function, normal IVC  Echo this admission showed: LVEF 30-35%, global hypokinesis, G1DD, mildly reduced RV function Started on IV Lasix.   Net -6.7 L this admission  RHC/LHC: non-obstructive CAD, mildly decompensated non-ischemic cardiomyopathy  Renal function trending down, creatinine 1.29 today from 1.37 Holding beta-blocker due to bradycardia Decreased EF could be secondary to frequent episodes of rapid A. Fib will continue with amiodarone to attempt to keep her in normal rhythm  Plan re-checking TTE in a few months to see if EF improves   Elevated troponin level  223 > 337 > 321 EKG showed no acute ischemic changes Some reported episodes of chest pain prior to admission Unclear if troponin elevation 2/2 to CHF, A. Fib with RVR or ACS Echo showed: LVEF 30-35%, global hypokinesis, G1DD, mildly reduced RV function Underwent RHC/LHC: non-obstructive CAD, mildly decompensated non-ischemic cardiomyopathy    Hyperlipidemia  10/26/2023: HDL 43; LDL Chol Calc (NIH) 80 02/23/2024: ALT 136  Continue Lipitor 40 mg daily     For questions or updates, please contact [facility] Please consult [email or web address] for contact info under       Signed, Lonni CROME  Kate, MD

## 2024-02-27 NOTE — Care Management Important Message (Signed)
 Important Message  Patient Details  Name: Kimberly Suarez MRN: [id number] Date of Birth: 1940-09-27   Important Message Given:  Yes - Medicare IM     Vonzell Arrie Sharps 02/27/2024, 9:49 AM

## 2024-02-27 NOTE — Plan of Care (Signed)

## 2024-02-27 NOTE — Progress Notes (Signed)
 Mobility Specialist Progress Note:    02/27/24 0920  Mobility  Activity Ambulated with assistance in hallway  Level of Assistance Standby assist, set-up cues, supervision of patient - no hands on  Assistive Device Front wheel walker  Distance Ambulated (ft) 200 ft  Mobility Referral Yes  Mobility visit 1 Mobility  Mobility Specialist Start Time (ACUTE ONLY) 0920  Mobility Specialist Stop Time (ACUTE ONLY) [id number]  Mobility Specialist Time Calculation (min) (ACUTE ONLY) 18 min   Pt agreeable and pleasant to session. Pt c/o slight lightheadedness when first standing but slowly went away throughout session. Though pt had no other complaints, noticed fluctuating HR's some reaching to 150's which was unusual for the pt. Once back in bed, HR stay in the mid 90's - 110's. Pt left in bed w/ no c/o and all needs met.   During Mobility: HR 130's - 150's Post Mobility: HR 90's - 110's  Venetia Keel Mobility Specialist Please Neurosurgeon or Rehab Office at [phone]

## 2024-02-27 NOTE — Progress Notes (Signed)
   Heart Failure Stewardship Pharmacist Progress Note   PCP: Jolinda Norene HERO, DO PCP-Cardiologist: Lynwood Schilling, MD    HPI:  83 yo F with PMH of CHF, HTN, HLD, afib, CKD, arthritis, IBS, skin cancer, neuropathy, and GERD.   Presented to the ED on 6/28 with shortness of breath, orthopnea, and chest pain. CXR with cardiomegaly, edema vs infection, and trace pleural effusions. BNP elevated 1329. ECHO 6/30 with LVEF 30-35% (was 55-60% in 06/2023), global hypokinesis, G1DD, RV mildly reduced. R/LHC on 7/1 with coronary calcification without any significant obstructive CAD. RA 7, PA 29, wedge 14, CO 4.9, CI 2.9.   States she is feeling well. No shortness of breath or LE edema. Did not feel when she went into afib. Says she slept well last night.   States she has taken the enalapril  for years but thinks she was only taking it once daily. She can afford her Eliquis  copay but if additional medications are started that are also $47 per month she would need assistance with this.   Agreeable to using [facility]OC pharmacy at discharge. She normally uses [facility] mail order for her generic medications. Fills Eliquis  with [facility] since this is the only medication with a copay. Interested in switching her [facility] pharmacy to [facility] order, especially if other medications requiring a grant are started.   Current HF Medications: None  Prior to admission HF Medications: Beta blocker: metoprolol  XL 25 mg daily ACE/ARB/ARNI: enalapril  10 mg BID - patient may have been only taking this once daily  Pertinent Lab Values: Serum creatinine 1.29>1.35, BUN 29, Potassium 4.2, Sodium 133, BNP 1329.6, Magnesium 1.9   Vital Signs: Weight: 139 lbs (admission weight: 145 lbs) Blood pressure: 110/70s  Heart rate: 90-100s  I/O: net +0.2L yesterday; net -6.5L since admission  Medication Assistance / Insurance Benefits Check: Does the patient have prescription insurance?  Yes Type of insurance plan: UHC  Medicare  Does the patient qualify for medication assistance through manufacturers or grants?   Yes Eligible grants and/or patient assistance programs: [employer] grant Medication assistance applications in progress: none Medication assistance applications approved: none Approved medication assistance renewals will be completed by: pending  Outpatient Pharmacy:  Prior to admission outpatient pharmacy: [facility] Is the patient willing to use [facility] TOC pharmacy at discharge? Yes Is the patient willing to transition their outpatient pharmacy to utilize a [facility] outpatient pharmacy?   Yes    Assessment: 1. Acute on chronic systolic CHF (LVEF 30-35%), due to NICM - thought to be primarily due to afib. NYHA class II symptoms. - Holding IV lasix with creatinine bump. RHC with mildly elevated filling pressures. Does not appear fluid overloaded on exam.  - Previously holding BB due to bradycardia. HR is now back up. In and out of afib. Consider starting metoprolol  XL 25 mg daily pending HR when in sinus.  - Consider starting Entresto 24/26 mg BID, MRA, and SGLT2i prior to discharge vs at follow up. Creatinine remains elevated from baseline.    Plan: 1) Medication changes recommended at this time: - No changes at this time   2) Patient assistance: [employer] copay $47 - Farxiga/Jardiance copay $47 - Would need [employer] grant for cost assistance  3)  Education  - Initial education complete - Full education to be completed prior to discharge  Duwaine Plant, PharmD, BCPS Heart Failure Engineer, building services Phone [phone]

## 2024-02-28 ENCOUNTER — Other Ambulatory Visit (HOSPITAL_COMMUNITY): Payer: Self-pay

## 2024-02-28 DIAGNOSIS — I4891 Unspecified atrial fibrillation: Secondary | ICD-10-CM | POA: Diagnosis not present

## 2024-02-28 DIAGNOSIS — I5041 Acute combined systolic (congestive) and diastolic (congestive) heart failure: Secondary | ICD-10-CM

## 2024-02-28 LAB — COMPREHENSIVE METABOLIC PANEL WITH GFR
ALT: 34 U/L (ref 0–44)
AST: 16 U/L (ref 15–41)
Albumin: 2.7 g/dL — ABNORMAL LOW (ref 3.5–5.0)
Alkaline Phosphatase: 36 U/L — ABNORMAL LOW (ref 38–126)
Anion gap: 8 (ref 5–15)
BUN: 27 mg/dL — ABNORMAL HIGH (ref 8–23)
CO2: 27 mmol/L (ref 22–32)
Calcium: 9 mg/dL (ref 8.9–10.3)
Chloride: 94 mmol/L — ABNORMAL LOW (ref 98–111)
Creatinine, Ser: 1.35 mg/dL — ABNORMAL HIGH (ref 0.44–1.00)
GFR, Estimated: 39 mL/min — ABNORMAL LOW
Glucose, Bld: 120 mg/dL — ABNORMAL HIGH (ref 70–99)
Potassium: 4 mmol/L (ref 3.5–5.1)
Sodium: 129 mmol/L — ABNORMAL LOW (ref 135–145)
Total Bilirubin: 0.6 mg/dL (ref 0.0–1.2)
Total Protein: 5.8 g/dL — ABNORMAL LOW (ref 6.5–8.1)

## 2024-02-28 LAB — LIPOPROTEIN A (LPA): Lipoprotein (a): 208.4 nmol/L — ABNORMAL HIGH

## 2024-02-28 MED ORDER — FUROSEMIDE 40 MG PO TABS
40.0000 mg | ORAL_TABLET | ORAL | 0 refills | Status: DC | PRN
  Filled 2024-02-28: qty 30, 30d supply, fill #0

## 2024-02-28 MED ORDER — AMIODARONE HCL 200 MG PO TABS
ORAL_TABLET | ORAL | 0 refills | Status: DC
  Filled 2024-02-28: qty 88, 74d supply, fill #0

## 2024-02-28 MED ORDER — AMIODARONE HCL 200 MG PO TABS: 200.0000 mg | ORAL_TABLET | Freq: Every day | ORAL | Status: DC

## 2024-02-28 MED ORDER — AMIODARONE HCL 200 MG PO TABS
200.0000 mg | ORAL_TABLET | Freq: Two times a day (BID) | ORAL | Status: DC
  Administered 2024-02-28: 200 mg via ORAL
  Filled 2024-02-28: qty 1

## 2024-02-28 NOTE — Progress Notes (Addendum)
 Progress Note  Patient Name: Kimberly Suarez Date of Encounter: 02/28/2024 [facility] Cardiologist: Lynwood Schilling, MD   Interval Summary   Patient feeling good Converted back to NSR around 5 pm 7/2 Asymptomatic when in A. Fib with RVR  Currently on IV amiodarone    Vital Signs Vitals:   02/27/24 2004 02/28/24 0127 02/28/24 0406 02/28/24 0704  BP: (!) 140/57 (!) 137/54 (!) 151/62 (!) 141/58  Pulse: 64 60 60 (!) 56  Resp: 19 19 19 18   Temp: 98.1 F (36.7 C) 97.7 F (36.5 C) 97.6 F (36.4 C) (!) 97.5 F (36.4 C)  TempSrc: Oral Oral Oral Oral  SpO2: 96% 100% 99% 97%  Weight:   64.8 kg   Height:       Intake/Output Summary (Last 24 hours) at 02/28/2024 0839 Last data filed at 02/28/2024 9177 Gross per 24 hour  Intake 1427.32 ml  Output 2450 ml  Net -1022.68 ml      02/28/2024    4:06 AM 02/27/2024    4:21 AM 02/26/2024    1:41 AM  Last 3 Weights  Weight (lbs) 142 lb 13.7 oz 139 lb 15.9 oz 140 lb 6.9 oz  Weight (kg) 64.8 kg 63.5 kg 63.7 kg     Telemetry/ECG  Sinus rhythm, HR 60s, converted to NSR from A. Fib around 5 pm 7/2 - Personally Reviewed  Physical Exam  GEN: No acute distress.   Neck: No JVD Cardiac: RRR, no murmurs, rubs, or gallops.  Respiratory: Clear to auscultation bilaterally. GI: Soft, nontender, non-distended  MS: No edema  Assessment & Plan  83 y.o. female with a hx of HTN, HLD, Stage III CKD, carotid artery stenosis (dopplers in 12/2023 showed 1-39% RICA and 40-59% LICA stenoses) and paroxysmal atrial fibrillation who is being seen for dyspnea and decompensated heart failure    Atrial fibrillation with RVR  Known history of PAF on monitor 07/2023 with 2% burden, average HR 130s, up to 184 bpm when in AF Asymptomatic when in A. Fib even with high rates When in sinus, rates 40-50s therefore would not tolerate higher dose of BB  She went back into A-fib with RVR yesterday with HR 80s to 130s Re-started on IV amiodarone  Currently on IV  amiodarone and maintaining NSR, will discuss with MD in terms of timing to transitioning to PO amiodarone in an attempt to remain in NSR  Holding beta-blocker due to bradycardia when in sinus  Continue Eliquis  5 mg BID    Acute HFrEF Non-ischemic cardiomyopathy  Presented to ED with shortness of breath, LE edema, orthopnea BNP 1,329 CXR showed cardiomegaly, edema vs infection, trace effusions  Echo 06/2023: LVEF 55-60%, G1DD, normal RV function, normal IVC  Echo this admission showed: LVEF 30-35%, global hypokinesis, G1DD, mildly reduced RV function Given IV Lasix, last dose 6/30  Net -7.5 L this admission  RHC/LHC: non-obstructive CAD, mildly decompensated non-ischemic cardiomyopathy  Renal function stable, 1.35 today Holding beta-blocker due to bradycardia Decreased EF could be secondary to frequent episodes of rapid A. Fib will continue with amiodarone to attempt to keep her in normal rhythm Plan re-checking TTE in a few months to see if EF improves   Elevated troponin level  223 > 337 > 321 EKG showed no acute ischemic changes Some reported episodes of chest pain prior to admission Unclear if troponin elevation 2/2 to CHF, A. Fib with RVR or ACS Echo showed: LVEF 30-35%, global hypokinesis, G1DD, mildly reduced RV function Underwent RHC/LHC: non-obstructive  CAD, mildly decompensated non-ischemic cardiomyopathy    Hyperlipidemia  10/26/2023: HDL 43; LDL Chol Calc (NIH) 80 02/23/2024: ALT 136  Continue Lipitor 40 mg daily  [facility] will sign off.   Medication Recommendations: Amiodarone 200 mg twice daily x 2 weeks then reduce to 200 mg daily, Eliquis  5 mg twice daily, atorvastatin  40 mg daily.  Recommend monitoring daily weights and can take Lasix 40 mg daily as needed if gains more than 3 pounds in 1 day or 5 pounds in 1 week Other recommendations (labs, testing, etc): BMET in 1 week.  Would plan an echocardiogram in 3 months to evaluate for improvement in systolic  function with maintaining sinus rhythm Follow up as an outpatient: Will schedule    For questions or updates, please contact [facility] Please consult [email or web address] for contact info under       Signed, Waddell DELENA Donath, PA-C   Patient seen and examined.  Agree with above documentation.  On exam, patient is alert and oriented, regular rate and rhythm, no murmurs, lungs CTAB, no LE edema or JVD.  She converted to sinus rhythm, will convert from IV to p.o. amiodarone.  Recommend amiodarone 200 mg twice daily x 2 weeks, then decrease to 200 mg daily.  Okay for discharge from cardiac standpoint.  Would discharge with as needed Lasix, can take if gains more than 3 pounds in 1 day or 5 pounds 1 week.  Can plan to add additional GDMT if stable BP and renal function as outpatient.  Plan repeat echo in 3 months to evaluate for improvement in systolic function with maintaining sinus rhythm  Lonni LITTIE Nanas, MD

## 2024-02-28 NOTE — Progress Notes (Signed)
 Mobility Specialist Progress Note:    02/28/24 0920  Mobility  Activity Ambulated independently in hallway  Level of Assistance Standby assist, set-up cues, supervision of patient - no hands on  Assistive Device None  Distance Ambulated (ft) 500 ft  Activity Response Tolerated well  Mobility Referral Yes  Mobility visit 1 Mobility  Mobility Specialist Start Time (ACUTE ONLY) 0920  Mobility Specialist Stop Time (ACUTE ONLY) [id number]  Mobility Specialist Time Calculation (min) (ACUTE ONLY) 17 min   Pt agreeable and feeling better compared to yesterday morning. Pt felt good and stated its the best she has felt since being here. Ambulation also seemed to be faster than usual. No c/o to symptoms. Left pt in bed w/ all needs met and daughter in room.   Venetia Keel Mobility Specialist Please Neurosurgeon or Rehab Office at [phone]

## 2024-02-28 NOTE — Discharge Summary (Signed)
 [facility]  Physician Discharge Summary   Patient ID: Kimberly Suarez MRN: [id number] DOB/AGE: Dec 08, 1940 83 y.o.  Admit date: 02/23/2024 Discharge date: 02/28/2024    PCP: Jolinda Norene HERO, DO  DISCHARGE DIAGNOSES:    Acute Systolic congestive heart failure (HCC)   PAF (paroxysmal atrial fibrillation) (HCC)   NSTEMI (non-ST elevated myocardial infarction) (HCC)   Elevated troponin   Hyponatremia   RECOMMENDATIONS FOR OUTPATIENT FOLLOW UP: Cardiology to schedule outpatient follow-up  Home Health: Home health PT Equipment/Devices: None  CODE STATUS: Full code  DISCHARGE CONDITION: fair  Diet recommendation: Heart healthy  INITIAL HISTORY: 83 y.o. female with medical history significant for congestive heart failure, HTN, HLD, A-fib on Eliquis , arthritis, IBS, skin cancer, neuropathy and GERD who presented to the ED for evaluation of shortness of breath and chest pain.    HOSPITAL COURSE:   Acute combined systolic and diastolic congestive heart failure Last TTE on 06/2023 shows EF 55 to 60%, G1DD but no valvular abnormalities Echocardiogram from this admission showed LVEF of 30 to 35% with treatment diastolic dysfunction.  Right ventricular systolic function was mildly reduced.  No significant valvular abnormalities noted. Patient underwent cardiac catheterization which did not reveal significant obstruction. GDMT to be pursued in the outpatient setting. Diuretics only as needed. Cardiology will schedule outpatient follow-up.  Volume status is stable.     Atrial fibrillation with RVR  Patient on apixaban .   Was on metoprolol  which had to be held due to significant bradycardia.  Patient subsequently placed on amiodarone .  Required amiodarone  infusion with which she converted to sinus rhythm.  Will be discharged on oral amiodarone .  She will be off of beta-blockers.     Elevated troponin Rule out ACS versus demand ischemia Noted to have elevated troponin  levels.  EKG did not show any obvious ischemic changes. Patient was started on IV heparin  infusion.  Aspirin  was initiated. Seen by cardiology and underwent cardiac cath which did not reveal significant CAD.   Hyponatremia Probably due to hypovolemia.  Stable.   Acute kidney injury on chronic kidney disease stage IIIb Baseline creatinine around 1.2.  Slight increase in creatinine noted during this admission.  Stable for the most part.     HLD Continue atorvastatin    Neuropathy Continue gabapentin   Patient is stable.  Cleared by cardiology.  Okay for discharge home today.  Discussed with her daughter.   PERTINENT LABS:  The results of significant diagnostics from this hospitalization (including imaging, microbiology, ancillary and laboratory) are listed below for reference.     Labs:   Basic Metabolic Panel: Recent Labs  Lab 02/23/24 1927 02/23/24 2242 02/25/24 0919 02/26/24 9795 02/26/24 0754 02/26/24 0758 02/27/24 0225 02/28/24 0216  NA 128*  --  130* 131* 132*  135 134* 133* 129*  K 4.0  --  2.9* 3.9 3.7  3.9 4.2 4.2 4.0  CL 92*  --  87* 94*  --   --  98 94*  CO2 24  --  27 29  --   --  26 27  GLUCOSE 132*  --  215* 117*  --   --  128* 120*  BUN 36*  --  31* 32*  --   --  29* 27*  CREATININE 1.04*  --  1.37* 1.29*  --   --  1.35* 1.35*  CALCIUM  9.1  --  8.6* 8.5*  --   --  8.5* 9.0  MG  --  1.9 1.8 1.9  --   --   --   --  Liver Function Tests: Recent Labs  Lab 02/23/24 2242 02/28/24 0216  AST 88* 16  ALT 136* 34  ALKPHOS 46 36*  BILITOT 1.1 0.6  PROT 7.7 5.8*  ALBUMIN 3.9 2.7*   CBC: Recent Labs  Lab 02/23/24 1927 02/25/24 0919 02/26/24 0204 02/26/24 0754 02/26/24 0758 02/27/24 0225  WBC 17.4* 8.3 9.3  --   --  9.0  HGB 12.1 12.0 12.2 11.6*  11.9* 12.6 13.0  HCT 37.2 35.9* 36.5 34.0*  35.0* 37.0 39.1  MCV 94.2 90.0 89.5  --   --  89.7  PLT 228 232 241  --   --  264   BNP: BNP (last 3 results) Recent Labs    02/23/24 2242  BNP  1,329.6*      IMAGING STUDIES CARDIAC CATHETERIZATION Result Date: 02/26/2024 Coronary angiography 02/26/2024: LM: No significant disease LAD: Moderate calcification without any significant stenosis Lcx: Large dominant vessel.        Moderate calcification without any significant stenosis RCA: Small non dominant vessel. No significant disease LVEDP 23 mmHg Right heart catheterization 02/26/2024: RA: 7 mmHg RV: 38/4 mmHg PA: 39/17 mmHg, mPAP 29 mmHg PCW: 14 mmHg AO sats: 99% PA sats: 71% CO: 4.9 L/min CI: 2.9 L/min/m2 Coronary calcification without any significant obstructive coronary artery disease Mildly decompensated nonischemic cardiomyopathy [name], MD   ECHOCARDIOGRAM COMPLETE Result Date: 02/25/2024    ECHOCARDIOGRAM REPORT   Patient Name:   Kimberly Suarez Date of Exam: 02/25/2024 Medical Rec #:  [id number]        Height:       64.0 in Accession #:    [id number]       Weight:       145.1 lb Date of Birth:  1941-02-01         BSA:          1.707 m Patient Age:    83 years         BP:           135/63 mmHg Patient Gender: F                HR:           49 bpm. Exam Location:  Inpatient Procedure: 2D Echo, Cardiac Doppler, Color Doppler and Intracardiac            Opacification Agent (Both Spectral and Color Flow Doppler were            utilized during procedure). Indications:    R06.02 SOB  History:        Patient has prior history of Echocardiogram examinations, most                 recent 07/25/2023. CHF, Previous Myocardial Infarction, Abnormal                 ECG, Arrythmias:Atrial Fibrillation, Signs/Symptoms:Chest Pain;                 Risk Factors:Hypertension and Dyslipidemia.  Sonographer:    Ellouise Mose RDCS Referring Phys: [id number] PROSPER M AMPONSAH IMPRESSIONS  1. Left ventricular ejection fraction, by estimation, is 30 to 35%. The left ventricle has moderately decreased function. The left ventricle demonstrates global hypokinesis. Left ventricular diastolic parameters are consistent  with Grade I diastolic dysfunction (impaired relaxation).  2. Right ventricular systolic function is mildly reduced. The right ventricular size is normal. There is normal pulmonary artery systolic pressure. The estimated right ventricular systolic pressure is 23.2 mmHg.  3. The mitral valve is normal in structure. Trivial mitral valve regurgitation. No evidence of mitral stenosis.  4. The aortic valve is tricuspid. Aortic valve regurgitation is not visualized. No aortic stenosis is present. Comparison(s): The left ventricular function is significantly worse. FINDINGS  Left Ventricle: Left ventricular ejection fraction, by estimation, is 30 to 35%. The left ventricle has moderately decreased function. The left ventricle demonstrates global hypokinesis. Definity  contrast agent was given IV to delineate the left ventricular endocardial borders. The left ventricular internal cavity size was normal in size. There is no left ventricular hypertrophy. Left ventricular diastolic parameters are consistent with Grade I diastolic dysfunction (impaired relaxation). Right Ventricle: The right ventricular size is normal. No increase in right ventricular wall thickness. Right ventricular systolic function is mildly reduced. There is normal pulmonary artery systolic pressure. The tricuspid regurgitant velocity is 2.25 m/s, and with an assumed right atrial pressure of 3 mmHg, the estimated right ventricular systolic pressure is 23.2 mmHg. Left Atrium: Left atrial size was normal in size. Right Atrium: Right atrial size was normal in size. Pericardium: There is no evidence of pericardial effusion. Mitral Valve: The mitral valve is normal in structure. Trivial mitral valve regurgitation. No evidence of mitral valve stenosis. Tricuspid Valve: The tricuspid valve is normal in structure. Tricuspid valve regurgitation is mild . No evidence of tricuspid stenosis. Aortic Valve: The aortic valve is tricuspid. Aortic valve regurgitation is not  visualized. No aortic stenosis is present. Pulmonic Valve: The pulmonic valve was normal in structure. Pulmonic valve regurgitation is trivial. No evidence of pulmonic stenosis. Aorta: The aortic root and ascending aorta are structurally normal, with no evidence of dilitation. IAS/Shunts: No atrial level shunt detected by color flow Doppler.  LEFT VENTRICLE PLAX 2D LVIDd:         4.10 cm     Diastology LVIDs:         3.70 cm     LV e' medial:    3.81 cm/s LV PW:         0.90 cm     LV E/e' medial:  21.5 LV IVS:        0.90 cm     LV e' lateral:   7.51 cm/s LVOT diam:     1.95 cm     LV E/e' lateral: 10.9 LV SV:         51 LV SV Index:   30 LVOT Area:     2.99 cm  LV Volumes (MOD) LV vol d, MOD A2C: 88.6 ml LV vol d, MOD A4C: 73.9 ml LV vol s, MOD A2C: 42.7 ml LV vol s, MOD A4C: 51.0 ml LV SV MOD A2C:     45.9 ml LV SV MOD A4C:     73.9 ml LV SV MOD BP:      34.9 ml RIGHT VENTRICLE             IVC RV S prime:     10.80 cm/s  IVC diam: 1.70 cm TAPSE (M-mode): 2.0 cm LEFT ATRIUM             Index        RIGHT ATRIUM           Index LA diam:        4.50 cm 2.64 cm/m   RA Area:     12.20 cm LA Vol (A2C):   36.2 ml 21.18 ml/m  RA Volume:   24.00 ml  14.06 ml/m LA Vol (A4C):  45.8 ml 26.83 ml/m LA Biplane Vol: 39.3 ml 23.03 ml/m  AORTIC VALVE LVOT Vmax:   63.40 cm/s LVOT Vmean:  41.400 cm/s LVOT VTI:    0.170 m  AORTA Ao Root diam: 3.10 cm Ao Asc diam:  3.20 cm MITRAL VALVE               TRICUSPID VALVE MV Area (PHT): 2.24 cm    TR Peak grad:   20.2 mmHg MV Decel Time: 338 msec    TR Vmax:        225.00 cm/s MV E velocity: 81.80 cm/s MV A velocity: 73.40 cm/s  SHUNTS MV E/A ratio:  1.11        Systemic VTI:  0.17 m                            Systemic Diam: 1.95 cm Aditya Sabharwal Electronically signed by Ria Commander Signature Date/Time: 02/25/2024/1:45:21 PM    Final    DG Chest 2 View Result Date: 02/23/2024 CLINICAL DATA:  chest pain EXAM: CHEST - 2 VIEW COMPARISON:  November 07, 2017 FINDINGS: Lower lung  volumes. Bilateral perihilar interstitial opacities with patchy opacities in both lung bases. Blunting of both costophrenic sulci. Mild cardiomegaly. Tortuous aorta with aortic atherosclerosis.No acute fracture or destructive lesion. IMPRESSION: 1. Mild cardiomegaly. Low lung volumes with changes of either interstitial edema, bronchovascular crowding, or atypical/viral infection. 2. Trace bilateral pleural effusions. Electronically Signed   By: Rogelia Myers M.D.   On: 02/23/2024 19:18    DISCHARGE EXAMINATION: Vitals:   02/27/24 2004 02/28/24 0127 02/28/24 0406 02/28/24 0704  BP: (!) 140/57 (!) 137/54 (!) 151/62 (!) 141/58  Pulse: 64 60 60 (!) 56  Resp: 19 19 19 18   Temp: 98.1 F (36.7 C) 97.7 F (36.5 C) 97.6 F (36.4 C) (!) 97.5 F (36.4 C)  TempSrc: Oral Oral Oral Oral  SpO2: 96% 100% 99% 97%  Weight:   64.8 kg   Height:       General appearance: Awake alert.  In no distress Resp: Clear to auscultation bilaterally.  Normal effort Cardio: S1-S2 is normal regular.  No S3-S4.  No rubs murmurs or bruit GI: Abdomen is soft.  Nontender nondistended.  Bowel sounds are present normal.  No masses organomegaly Extremities: No edema.  Full range of motion of lower extremities. Neurologic: Alert and oriented x3.  No focal neurological deficits.    DISPOSITION: Home  Discharge Instructions     (HEART FAILURE PATIENTS) Call MD:  Anytime you have any of the following symptoms: 1) 3 pound weight gain in 24 hours or 5 pounds in 1 week 2) shortness of breath, with or without a dry hacking cough 3) swelling in the hands, feet or stomach 4) if you have to sleep on extra pillows at night in order to breathe.   Complete by: As directed    AMB referral to Phase II Cardiac Rehabilitation   Complete by: As directed    Diagnosis: Heart Failure (see criteria below if ordering Phase II)   Heart Failure Type: Chronic Systolic   After initial evaluation and assessments completed: Virtual Based Care may  be provided alone or in conjunction with Phase 2 Cardiac Rehab based on patient barriers.: Yes   Intensive Cardiac Rehabilitation (ICR) [facility] location only OR Traditional Cardiac Rehabilitation (TCR) *If criteria for ICR are not met will enroll in TCR [facility] only): Yes   Call MD for:  difficulty  breathing, headache or visual disturbances   Complete by: As directed    Call MD for:  extreme fatigue   Complete by: As directed    Call MD for:  persistant dizziness or light-headedness   Complete by: As directed    Call MD for:  persistant nausea and vomiting   Complete by: As directed    Call MD for:  severe uncontrolled pain   Complete by: As directed    Call MD for:  temperature >100.4   Complete by: As directed    Diet - low sodium heart healthy   Complete by: As directed    Discharge instructions   Complete by: As directed    Please take your medications as prescribed.  Please follow instructions provided by cardiology.  You were cared for by a hospitalist during your hospital stay. If you have any questions about your discharge medications or the care you received while you were in the hospital after you are discharged, you can call the unit and asked to speak with the hospitalist on call if the hospitalist that took care of you is not available. Once you are discharged, your primary care physician will handle any further medical issues. Please note that NO REFILLS for any discharge medications will be authorized once you are discharged, as it is imperative that you return to your primary care physician (or establish a relationship with a primary care physician if you do not have one) for your aftercare needs so that they can reassess your need for medications and monitor your lab values. If you do not have a primary care physician, you can call [phone] for a physician referral.   Increase activity slowly   Complete by: As directed          Allergies as of 02/28/2024   No Known Allergies       Medication List     STOP taking these medications    enalapril  10 MG tablet Commonly known as: VASOTEC    famotidine  20 MG tablet Commonly known as: PEPCID    hydrochlorothiazide  25 MG tablet Commonly known as: HYDRODIURIL    metoprolol  succinate 25 MG 24 hr tablet Commonly known as: Toprol  XL       TAKE these medications    acetaminophen  500 MG tablet Commonly known as: TYLENOL  Take 2 tablets (1,000 mg total) by mouth 3 (three) times daily. What changed:  when to take this reasons to take this   Alpha-Lipoic Acid 600 MG Caps Take by mouth.   amiodarone  200 MG tablet Commonly known as: PACERONE  Take 1 tablet (200 mg total) by mouth 2 (two) times daily for 14 days, THEN 1 tablet (200 mg total) daily. Start taking on: February 28, 2024   apixaban  5 MG Tabs tablet Commonly known as: ELIQUIS  Take 1 tablet (5 mg total) by mouth 2 (two) times daily for afib   atorvastatin  40 MG tablet Commonly known as: LIPITOR TAKE 1 TABLET BY MOUTH DAILY   Fish Oil 1000 MG Caps Take 1 capsule by mouth daily.   fluticasone  50 MCG/ACT nasal spray Commonly known as: FLONASE  Place 1 spray into both nostrils 2 (two) times daily as needed for allergies or rhinitis.   furosemide  40 MG tablet Commonly known as: Lasix  Take 1 tablet (40 mg total) by mouth daily as needed, if gains more than 3 pounds in 1 day or 5 pounds in 1 week   gabapentin  100 MG capsule Commonly known as: NEURONTIN  TAKE 3 CAPSULES BY MOUTH AT  BEDTIME   GLUCOSAMINE CHONDR COMPLEX PO Take 1 tablet by mouth 2 (two) times daily.   Melatonin 10 MG Caps Take by mouth at bedtime and may repeat dose one time if needed.   MULTIVITAMIN ADULT PO Take 1 tablet by mouth daily.   saccharomyces boulardii 250 MG capsule Commonly known as: FLORASTOR Take 250 mg by mouth 2 (two) times daily.   TURMERIC CURCUMIN PO Take 1,500 mg by mouth 2 (two) times daily.   vitamin B-12 500 MCG tablet Commonly known as:  CYANOCOBALAMIN Take 500 mcg by mouth daily.   VITAMIN D  PO Take by mouth.          Follow-up Information      [facility]. Go in 7 day(s).   Specialty: Cardiology Why: Hospital follow up 03/04/2024 @ 11:45 am PLEASE bring a current medication list to appointment FREE valet parking, Entrance C, off [employer] [address] and [employer] entrance. Contact information: [address] [place]  [address] [phone]        Jolinda Norene HERO, DO. Schedule an appointment as soon as possible for a visit in 1 week(s).   Specialty: Family Medicine Why: post hospitalization follow up Contact information: [address] [place] [address] [phone]         [facility] Follow up.   Why: rollator will be delivered to the house Contact information: [phone]        [facility] Follow up.   Specialty: Home Health Services Why: Agency will call you to set up apt times, Contact information: [address] [place] [address] [phone]                 TOTAL DISCHARGE TIME: 35 minutes  Shuntay Everetts Verdene  [facility] Pager on [email or web address]  02/29/2024, 11:44 AM

## 2024-02-28 NOTE — Plan of Care (Signed)
 Patient discharging home today.

## 2024-02-28 NOTE — TOC Transition Note (Addendum)
 Transition of Care [facility]) - Discharge Note   Patient Details  Name: Kimberly Suarez MRN: [id number] Date of Birth: 12-05-1940  [facility] CM/SW Contact:  Waddell Barnie Rama, RN Phone Number: 02/28/2024, 11:16 AM   Clinical Narrative:    For dc today, she states she will be continuing to going to her gym.  She has no needs  Per physical therapy states she will need a rollator and HHPT,  NCM offered choice, she does not have a preference.  NCM made referral to [facility] for HHPT.  She Is able to take with soc beginning 24 to 48 hrs post dc.  [employer] to deliver rollator to patient's home. Awaiting HHPT order from MD.          Patient Goals and CMS Choice            Discharge Placement                       Discharge Plan and Services Additional resources added to the After Visit Summary for                                       Social Drivers of Health (SDOH) Interventions SDOH Screenings   Food Insecurity: No Food Insecurity (02/24/2024)  Housing: Unknown (02/26/2024)  Transportation Needs: No Transportation Needs (02/26/2024)  Utilities: Not At Risk (02/24/2024)  Alcohol Screen: Low Risk  (02/26/2024)  Depression (PHQ2-9): Low Risk  (01/29/2024)  Financial Resource Strain: Low Risk  (02/26/2024)  Physical Activity: Insufficiently Active (10/09/2018)  Social Connections: Moderately Isolated (02/24/2024)  Stress: No Stress Concern Present (10/09/2018)  Tobacco Use: Low Risk  (02/23/2024)     Readmission Risk Interventions    02/26/2024    3:47 PM  Readmission Risk Prevention Plan  Transportation Screening Complete  PCP or Specialist Appt within 5-7 Days Complete  Home Care Screening Complete  Medication Review (RN CM) Complete

## 2024-02-28 NOTE — Progress Notes (Signed)
Heart Failure Stewardship Pharmacist Progress Note   PCP: Jolinda Norene HERO, DO PCP-Cardiologist: Lynwood Schilling, MD    HPI:  83 yo F with PMH of CHF, HTN, HLD, afib, CKD, arthritis, IBS, skin cancer, neuropathy, and GERD.   Presented to the ED on 6/28 with shortness of breath, orthopnea, and chest pain. CXR with cardiomegaly, edema vs infection, and trace pleural effusions. BNP elevated 1329. ECHO 6/30 with LVEF 30-35% (was 55-60% in 06/2023), global hypokinesis, G1DD, RV mildly reduced. R/LHC on 7/1 with coronary calcification without any significant obstructive CAD. RA 7, PA 29, wedge 14, CO 4.9, CI 2.9.   Converted back to NSR. Met with patient and her daughter at bedside. No shortness of breath or LE edema. Hopeful for discharge today.  States she has taken the enalapril  for years but thinks she was only taking it once daily. She can afford her Eliquis  copay but if additional medications are started that are also $47 per month she would need assistance with this.   Agreeable to using [facility] TOC pharmacy at discharge. She normally uses [facility] mail order for her generic medications. Fills Eliquis  with [facility] since this is the only medication with a copay. Interested in switching her [facility] pharmacy to [employer] order, especially if other medications requiring a grant are started.   Current HF Medications: None  Prior to admission HF Medications: Beta blocker: metoprolol  XL 25 mg daily ACE/ARB/ARNI: enalapril  10 mg BID - patient may have been only taking this once daily  Pertinent Lab Values: Serum creatinine 1.35, BUN 27, Potassium 4.0, Sodium 129, BNP 1329.6, Magnesium 1.9   Vital Signs: Weight: 142 lbs (admission weight: 145 lbs) Blood pressure: 140/50s  Heart rate: 50-60s  I/O: net -0.7L yesterday; net -7.3L since admission  Medication Assistance / Insurance Benefits Check: Does the patient have prescription insurance?  Yes Type of insurance plan: UHC Medicare  Does the  patient qualify for medication assistance through manufacturers or grants?   Yes Eligible grants and/or patient assistance programs: [employer] grant Medication assistance applications in progress: none Medication assistance applications approved: none Approved medication assistance renewals will be completed by: pending  Outpatient Pharmacy:  Prior to admission outpatient pharmacy: [facility] Is the patient willing to use [facility] TOC pharmacy at discharge? Yes Is the patient willing to transition their outpatient pharmacy to utilize a [facility] outpatient pharmacy?   Yes    Assessment: 1. Acute on chronic systolic CHF (LVEF 30-35%), due to NICM - thought to be primarily due to afib. NYHA class II symptoms. - Holding IV lasix with creatinine bump, stable today. RHC with mildly elevated filling pressures. Does not appear fluid overloaded on exam.  - Holding BB due to bradycardia.  - Consider starting Entresto 24/26 mg BID, MRA, and SGLT2i prior to discharge vs at follow up. Creatinine remains elevated from baseline, stable today.    Plan: 1) Medication changes recommended at this time: - Consider adding PRN lasix for discharge  2) Patient assistance: - Entresto copay $47 - Farxiga/Jardiance copay $47 - Would need [employer] grant for cost assistance  3)  Education  - Patient has been educated on current HF medications and potential additions to HF medication regimen - Patient verbalizes understanding that over the next few months, these medication doses may change and more medications may be added to optimize HF regimen - Patient has been educated on basic disease state pathophysiology and goals of therapy   Duwaine Plant, PharmD, BCPS Heart Failure Engineer, building services Phone [phone])  [phone]   

## 2024-02-28 NOTE — Progress Notes (Signed)
 Physical Therapy Treatment Patient Details Name: Kimberly Suarez MRN: [id number] DOB: [birth year] Today's Date: 02/28/2024   History of Present Illness Pt is a 83 y.o female admitted 6/28 for chest pains. Dx w/ pneumonia a few weeks ago. Elevated troponins, CHF exacerbations.PMH: a-fib, HTN, HLD, cataracts, CKD    PT Comments  Pt admitted with above diagnosis. Pt continues to have LOB with challenges. Recommended rollator for pt and will need HHPT initially to reinforce safety with rollator. Pt and daughter in agreement. Issued gait belt as well.  Pt currently with functional limitations due to the deficits listed below (see PT Problem List). Pt will benefit from acute skilled PT to increase their independence and safety with mobility to allow discharge.       If plan is discharge home, recommend the following: A little help with walking and/or transfers;A little help with bathing/dressing/bathroom;Assistance with cooking/housework;Assist for transportation;Help with stairs or ramp for entrance   Can travel by private vehicle        Equipment Recommendations  Rollator (4 wheels)    Recommendations for Other Services       Precautions / Restrictions Precautions Precautions: None Recall of Precautions/Restrictions: Intact Restrictions Weight Bearing Restrictions Per Provider Order: No     Mobility  Bed Mobility Overal bed mobility: Needs Assistance Bed Mobility: Supine to Sit     Supine to sit: Independent          Transfers Overall transfer level: Needs assistance Equipment used: Rollator (4 wheels) Transfers: Sit to/from Stand, Bed to chair/wheelchair/BSC Sit to Stand: Supervision   Step pivot transfers: Supervision       General transfer comment: Cues given for rollator safety and for hand placement.    Ambulation/Gait Ambulation/Gait assistance: Supervision Gait Distance (Feet): 400 Feet Assistive device: Rollator (4 wheels) Gait Pattern/deviations:  Step-through pattern, Decreased stride length       General Gait Details: Pt with good safety with rollator without LOB. Pt educated in safety with locking and unlocking brakes.   Stairs             Wheelchair Mobility     Tilt Bed    Modified Rankin (Stroke Patients Only)       Balance Overall balance assessment: Needs assistance Sitting-balance support: No upper extremity supported, Feet supported Sitting balance-Leahy Scale: Good     Standing balance support: During functional activity, Bilateral upper extremity supported Standing balance-Leahy Scale: Fair Standing balance comment: Statically no LOB, dynamically needs UE support                            Communication Communication Communication: No apparent difficulties  Cognition Arousal: Alert Behavior During Therapy: WFL for tasks assessed/performed   PT - Cognitive impairments: No apparent impairments                       PT - Cognition Comments: WFL for session, not formally assessed Following commands: Intact      Cueing Cueing Techniques: Verbal cues  Exercises      General Comments General comments (skin integrity, edema, etc.): VSS      Pertinent Vitals/Pain Pain Assessment Pain Assessment: No/denies pain    Home Living   Living Arrangements: Alone                      Prior Function            PT Goals (current  goals can now be found in the care plan section) Acute Rehab PT Goals Patient Stated Goal: improve strength in BLE to prepare for knee surgery Progress towards PT goals: Progressing toward goals    Frequency    Min 2X/week      PT Plan      Co-evaluation              AM-PAC PT 6 Clicks Mobility   Outcome Measure  Help needed turning from your back to your side while in a flat bed without using bedrails?: None Help needed moving from lying on your back to sitting on the side of a flat bed without using bedrails?:  None Help needed moving to and from a bed to a chair (including a wheelchair)?: A Little Help needed standing up from a chair using your arms (e.g., wheelchair or bedside chair)?: A Little Help needed to walk in hospital room?: A Little Help needed climbing 3-5 steps with a railing? : A Little 6 Click Score: 20    End of Session Equipment Utilized During Treatment: Gait belt Activity Tolerance: Patient tolerated treatment well Patient left: in bed;with call bell/phone within reach;with family/visitor present Nurse Communication: Mobility status PT Visit Diagnosis: Unsteadiness on feet (R26.81);Muscle weakness (generalized) (M62.81);Pain Pain - Right/Left: Right Pain - part of body: Knee     Time: 1230-1250 PT Time Calculation (min) (ACUTE ONLY): 20 min  Charges:    $Gait Training: 8-22 mins PT General Charges $$ ACUTE PT VISIT: 1 Visit                     Greco Gastelum M,PT Acute Rehab Services [phone]    Stephane JULIANNA Bevel 02/28/2024, 1:17 PM

## 2024-03-02 ENCOUNTER — Ambulatory Visit: Payer: Self-pay | Admitting: Family Medicine

## 2024-03-03 ENCOUNTER — Telehealth: Payer: Self-pay | Admitting: *Deleted

## 2024-03-03 ENCOUNTER — Telehealth (HOSPITAL_COMMUNITY): Payer: Self-pay | Admitting: *Deleted

## 2024-03-03 NOTE — Telephone Encounter (Signed)
 Please offer this patient DOD slot at 2:05 with me on Wednesday for hospital follow-up.  Will you see if the patient that is scheduled in the 15-minute slot just behind the 2:05 will move to the 2:35 so that this patient has a full 30-minute hospital follow-up slot?

## 2024-03-03 NOTE — Telephone Encounter (Signed)
 Called to confirm/remind patient of their appointment at the [facility] on 03/04/24.        Appointment:              [x] Confirmed             [] Left mess              [] No answer/No voice mail             [] Phone not in service   Patient reminded to bring all medications and/or complete list.   Confirmed patient has transportation. Gave directions, instructed to utilize valet parking.

## 2024-03-03 NOTE — Progress Notes (Signed)
 HEART & VASCULAR TRANSITION OF CARE CONSULT NOTE     Referring Physician:   Chief Complaint: F/u for systolic heart failure   HPI: Referred to clinic by Dr. Kate for heart failure consultation.   83 y.o. female with a hx of HTN, HLD, Stage III CKD, carotid artery stenosis (dopplers in 12/2023 showed 1-39% RICA and 40-59% LICA stenoses) and paroxysmal atrial fibrillation. Recent admission 6/25 for a/c systolic heart failure in the setting of afib w/ RVR. Previous Echo 06/2023: LVEF 55-60%, G1DD, normal RV function. Echo during this admission showed drop in EF down to 30-35%, global hypokinesis, G1DD, mildly reduced RV function. Also found to have elevated Hs trop  223 > 337 > 321. Subsequent R/LHC showed non-obstructive CAD, mildly decompensated non-ischemic cardiomyopathy (LVEDP 23 mmHg, RA 7, PAP 39/17, PCW 14, PA sat 71%, CI 2.9). She was diuresed w/ IV Lasix . Converted back to NSR w/ amiodarone . Transitioned to PO Lasix  PRN. Referred to [facility] clinic.   She presents today for post hospital f/u.  EKG shows    Cardiac Testing    2D Echo   1. Left ventricular ejection fraction, by estimation, is 30 to 35%. The  left ventricle has moderately decreased function. The left ventricle  demonstrates global hypokinesis. Left ventricular diastolic parameters are  consistent with Grade I diastolic  dysfunction (impaired relaxation).   2. Right ventricular systolic function is mildly reduced. The right  ventricular size is normal. There is normal pulmonary artery systolic  pressure. The estimated right ventricular systolic pressure is 23.2 mmHg.   3. The mitral valve is normal in structure. Trivial mitral valve  regurgitation. No evidence of mitral stenosis.   4. The aortic valve is tricuspid. Aortic valve regurgitation is not  visualized. No aortic stenosis is present.     Coronary angiography 02/26/2024: LM: No significant disease LAD: Moderate calcification without any  significant stenosis Lcx: Large dominant vessel.        Moderate calcification without any significant stenosis RCA: Small non dominant vessel. No significant disease   LVEDP 23 mmHg   Right heart catheterization 02/26/2024: RA: 7 mmHg RV: 38/4 mmHg PA: 39/17 mmHg, mPAP 29 mmHg PCW: 14 mmHg   AO sats: 99% PA sats: 71%   CO: 4.9 L/min CI: 2.9 L/min/m2   Coronary calcification without any significant obstructive coronary artery disease Mildly decompensated nonischemic cardiomyopathy      Past Medical History:  Diagnosis Date   Allergy    Arthritis    ASCUS (atypical squamous cells of undetermined significance) on Pap smear 2011x2   Negative high-risk HPV, normal Pap smears 2012/2013   Atrial fibrillation (HCC)    Atypical nevus 05/02/2007   right back-moderate    Cancer (HCC)    Cataract    Diverticulosis    GERD (gastroesophageal reflux disease)    Hiatal hernia    Hyperlipemia    Hypertension    IBS (irritable bowel syndrome)    Kidney disease    Lymphocytic colitis 2007   Neuropathy    SCC (squamous cell carcinoma) 01/19/2006   bridge of nose (CX35FU)   SCC (squamous cell carcinoma) 03/08/2017   right inner lower shin (CX35FU)   SCC (squamous cell carcinoma) x 2 02/24/2002   right bridge of nose (Cx35FU),right bridge of nose   Uterine prolapse    Pessary    Current Outpatient Medications  Medication Sig Dispense Refill   acetaminophen  (TYLENOL ) 500 MG tablet Take 2 tablets (1,000 mg total) by mouth  3 (three) times daily. 180 tablet PRN   Alpha-Lipoic Acid 600 MG CAPS Take by mouth.     amiodarone  (PACERONE ) 200 MG tablet Take 1 tablet (200 mg total) by mouth 2 (two) times daily for 14 days, THEN 1 tablet (200 mg total) daily. 88 tablet 0   apixaban  (ELIQUIS ) 5 MG TABS tablet Take 1 tablet (5 mg total) by mouth 2 (two) times daily for afib 60 tablet 0   atorvastatin  (LIPITOR) 40 MG tablet TAKE 1 TABLET BY MOUTH DAILY 100 tablet 1   fluticasone  (FLONASE ) 50  MCG/ACT nasal spray Place 1 spray into both nostrils 2 (two) times daily as needed for allergies or rhinitis. 16 g 6   furosemide  (LASIX ) 40 MG tablet Take 1 tablet (40 mg total) by mouth daily as needed, if gains more than 3 pounds in 1 day or 5 pounds in 1 week 30 tablet 0   gabapentin  (NEURONTIN ) 100 MG capsule TAKE 3 CAPSULES BY MOUTH AT  BEDTIME 300 capsule 1   Glucosamine-Chondroitin (GLUCOSAMINE CHONDR COMPLEX PO) Take 1 tablet by mouth 2 (two) times daily.     Melatonin 10 MG CAPS Take by mouth at bedtime and may repeat dose one time if needed.     Multiple Vitamins-Minerals (MULTIVITAMIN ADULT PO) Take 1 tablet by mouth daily.     Omega-3 Fatty Acids (FISH OIL) 1000 MG CAPS Take 1 capsule by mouth daily.     saccharomyces boulardii (FLORASTOR) 250 MG capsule Take 250 mg by mouth 2 (two) times daily.     TURMERIC CURCUMIN PO Take 1,500 mg by mouth 2 (two) times daily.     vitamin B-12 (CYANOCOBALAMIN) 500 MCG tablet Take 500 mcg by mouth daily.     VITAMIN D  PO Take by mouth.     No current facility-administered medications for this visit.    No Known Allergies    Social History   Socioeconomic History   Marital status: Widowed    Spouse name: Not on file   Number of children: 3   Years of education: Not on file   Highest education level: 8th grade  Occupational History   Occupation: Retired   Tobacco Use   Smoking status: Never   Smokeless tobacco: Never  Vaping Use   Vaping status: Never Used  Substance and Sexual Activity   Alcohol use: Not Currently   Drug use: No   Sexual activity: Not Currently    Birth control/protection: Post-menopausal    Comment: 1st intercourse 15 yo--1 partner  Other Topics Concern   Not on file  Social History Narrative   Lives at home with husband.   Right-handed.   Caffeine use: one cup caffeine some days.   Social Drivers of Corporate investment banker Strain: Low Risk  (02/26/2024)   Overall Financial Resource Strain (CARDIA)     Difficulty of Paying Living Expenses: Not very hard  Food Insecurity: No Food Insecurity (03/03/2024)   Hunger Vital Sign    Worried About Running Out of Food in the Last Year: Never true    Ran Out of Food in the Last Year: Never true  Transportation Needs: No Transportation Needs (03/03/2024)   PRAPARE - Administrator, Civil Service (Medical): No    Lack of Transportation (Non-Medical): No  Physical Activity: Insufficiently Active (10/09/2018)   Exercise Vital Sign    Days of Exercise per Week: 2 days    Minutes of Exercise per Session: 30 min  Stress: No  Stress Concern Present (10/09/2018)   [employer] of Occupational Health - Occupational Stress Questionnaire    Feeling of Stress : Not at all  Social Connections: Moderately Isolated (02/24/2024)   Social Connection and Isolation Panel    Frequency of Communication with Friends and Family: More than three times a week    Frequency of Social Gatherings with Friends and Family: More than three times a week    Attends Religious Services: More than 4 times per year    Active Member of [employer] or Organizations: No    Attends Banker Meetings: Never    Marital Status: Widowed  Intimate Partner Violence: Not At Risk (03/03/2024)   Humiliation, Afraid, Rape, and Kick questionnaire    Fear of Current or Ex-Partner: No    Emotionally Abused: No    Physically Abused: No    Sexually Abused: No      Family History  Problem Relation Age of Onset   Heart disease Mother    COPD Father    Asthma Father    Alzheimer's disease Sister    Multiple sclerosis Sister    Heart disease Sister    Diabetes Brother        BKA   Alzheimer's disease Brother    Diabetes Brother    Early death Brother        died in war   Stomach cancer Paternal Aunt    Colon cancer Neg Hx    Kidney disease Neg Hx    Liver disease Neg Hx    Esophageal cancer Neg Hx    Rectal cancer Neg Hx     There were no vitals filed for this  visit.  PHYSICAL EXAM: General:  Well appearing. No respiratory difficulty HEENT: normal Neck: supple. no JVD. Carotids 2+ bilat; no bruits. No lymphadenopathy or thryomegaly appreciated. Cor: PMI nondisplaced. Regular rate & rhythm. No rubs, gallops or murmurs. Lungs: clear Abdomen: soft, nontender, nondistended. No hepatosplenomegaly. No bruits or masses. Good bowel sounds. Extremities: no cyanosis, clubbing, rash, edema Neuro: alert & oriented x 3, cranial nerves grossly intact. moves all 4 extremities w/o difficulty. Affect pleasant.  ECG:   ASSESSMENT & PLAN:  NYHA *** GDMT  Diuretic- BB- Ace/ARB/ARNI MRA SGLT2i    Referred to HFSW (PCP, Medications, Transportation, ETOH Abuse, Drug Abuse, Insurance, Financial ): Yes or No Refer to Pharmacy: Yes or No Refer to Home Health: Yes on No Refer to [facility]: Yes or no  Refer to General Cardiology: Yes or No  Follow up

## 2024-03-03 NOTE — Transitions of Care (Post Inpatient/ED Visit) (Signed)
 03/03/2024  Name: Kimberly Suarez MRN: [id number] DOB: 1940/09/22  Today's TOC FU Call Status: Today's TOC FU Call Status:: Successful TOC FU Call Completed TOC FU Call Complete Date: 03/03/24 Patient's Name and Date of Birth confirmed.  Transition Care Management Follow-up Telephone Call Date of Discharge: 02/28/24 Discharge Facility: [facility]) Type of Discharge: Inpatient Admission Primary Inpatient Discharge Diagnosis:: Acute Congestive Heart Failure How have you been since you were released from the hospital?: Better Any questions or concerns?: No  Items Reviewed: Did you receive and understand the discharge instructions provided?: Yes Medications obtained,verified, and reconciled?: Yes (Medications Reviewed) Any new allergies since your discharge?: No Dietary orders reviewed?: Yes Type of Diet Ordered:: Low sodium heart healthy Do you have support at home?: Yes People in Home [RPT]: alone Name of Support/Comfort Primary Source: Debra  Medications Reviewed Today: Medications Reviewed Today     Reviewed by Kennieth Cathlean DEL, RN (Case Manager) on 03/03/24 at 1056  Med List Status: <None>   Medication Order Taking? Sig Documenting Provider Last Dose Status Informant  acetaminophen  (TYLENOL ) 500 MG tablet [id number] Yes Take 2 tablets (1,000 mg total) by mouth 3 (three) times daily. Zollie Lowers, MD  Active Self, Pharmacy Records  Alpha-Lipoic Acid 600 MG CAPS [id number] Yes Take by mouth. [provider]  Active Self, Pharmacy Records  amiodarone  (PACERONE ) 200 MG tablet [id number] Yes Take 1 tablet (200 mg total) by mouth 2 (two) times daily for 14 days, THEN 1 tablet (200 mg total) daily. Krishnan, Gokul, MD  Active   apixaban  (ELIQUIS ) 5 MG TABS tablet [id number] Yes Take 1 tablet (5 mg total) by mouth 2 (two) times daily for afib Jolinda Norene HERO, DO  Active Self, Pharmacy Records  atorvastatin  (LIPITOR) 40 MG tablet [id number] Yes TAKE 1 TABLET BY MOUTH  DAILY Jolinda Norene HERO, DO  Active Self, Pharmacy Records  fluticasone  (FLONASE ) 50 MCG/ACT nasal spray [id number] Yes Place 1 spray into both nostrils 2 (two) times daily as needed for allergies or rhinitis. Lavell Bari LABOR, FNP  Active Self, Pharmacy Records  furosemide  (LASIX ) 40 MG tablet [id number] Yes Take 1 tablet (40 mg total) by mouth daily as needed, if gains more than 3 pounds in 1 day or 5 pounds in 1 week Krishnan, Gokul, MD  Active   gabapentin  (NEURONTIN ) 100 MG capsule [id number] Yes TAKE 3 CAPSULES BY MOUTH AT  BEDTIME Jolinda Norene HERO, DO  Active Self, Pharmacy Records  Glucosamine-Chondroitin (GLUCOSAMINE CHONDR COMPLEX PO) [id number] Yes Take 1 tablet by mouth 2 (two) times daily. [provider]  Active Self, Pharmacy Records           Med Note JUSTINO, [facility]   Thu Jun 01, 2016 10:14 AM)    Melatonin 10 MG CAPS [id number] Yes Take by mouth at bedtime and may repeat dose one time if needed. [provider]  Active Self, Pharmacy Records  Multiple Vitamins-Minerals (MULTIVITAMIN ADULT PO) [id number] Yes Take 1 tablet by mouth daily. [provider]  Active Self, Pharmacy Records  Omega-3 Fatty Acids (FISH OIL) 1000 MG CAPS [id number] Yes Take 1 capsule by mouth daily. [provider]  Active Self, Pharmacy Records           Med Note JUSTINO, [facility]   Thu Jun 01, 2016 10:15 AM)    saccharomyces boulardii (FLORASTOR) 250 MG capsule [id number] Yes Take 250 mg by mouth 2 (two) times daily. [provider]  Active Self, Pharmacy Records  TURMERIC CURCUMIN PO [id number]  Yes Take 1,500 mg by mouth 2 (two) times daily. [provider]  Active Self, Pharmacy Records  vitamin B-12 (CYANOCOBALAMIN) 500 MCG tablet [id number] Yes Take 500 mcg by mouth daily. [provider]  Active Self, Pharmacy Records  VITAMIN D  PO [id number] Yes Take by mouth. [provider]  Active Self, Pharmacy Records            Home Care and  Equipment/Supplies: Were Home Health Services Ordered?: Yes Name of Home Health Agency:: [facility] ([facility] given [facility] to follow up) Has Agency set up a time to come to your home?: No EMR reviewed for Home Health Orders: Orders present/patient has not received call (refer to CM for follow-up) Any new equipment or medical supplies ordered?: Yes Name of Medical supply agency?: [employer] (Rollator walker) Were you able to get the equipment/medical supplies?: Yes Do you have any questions related to the use of the equipment/supplies?: No  Functional Questionnaire: Do you need assistance with bathing/showering or dressing?: No Do you need assistance with meal preparation?: No Do you need assistance with eating?: No Do you have difficulty maintaining continence: No Do you need assistance with getting out of bed/getting out of a chair/moving?: No Do you have difficulty managing or taking your medications?: No  Follow up appointments reviewed: PCP Follow-up appointment confirmed?: Yes Date of PCP follow-up appointment?: 03/18/24 Follow-up Provider: Nena Hummer Specialist [facility] Follow-up appointment confirmed?: Yes Date of Specialist follow-up appointment?: 03/04/24 Follow-Up Specialty Provider:: Heart and Vascular specialist Do you need transportation to your follow-up appointment?: No Do you understand care options if your condition(s) worsen?: Yes-patient verbalized understanding  SDOH Interventions Today    Flowsheet Row Most Recent Value  SDOH Interventions   Food Insecurity Interventions Intervention Not Indicated  Housing Interventions Intervention Not Indicated  Transportation Interventions Intervention Not Indicated  Utilities Interventions Intervention Not Indicated    Goals Addressed             This Visit's Progress    VBCI Transitions of Care (TOC) Care Plan       Problems:  Recent Hospitalization for treatment of CHF Knowledge Deficit Related to  Congestive Heart Failure  Goal:  Over the next 30 days, the patient will not experience hospital readmission  Interventions:   Heart Failure Interventions: Basic overview and discussion of pathophysiology of Heart Failure reviewed Provided education on low sodium diet Assessed need for readable accurate scales in home Provided education about placing scale on hard, flat surface Advised patient to weigh each morning after emptying bladder Discussed importance of daily weight and advised patient to weigh and record daily Reviewed role of diuretics in prevention of fluid overload and management of heart failure; Discussed the importance of keeping all appointments with provider  Patient Self Care Activities:  Attend all scheduled provider appointments Call pharmacy for medication refills 3-7 days in advance of running out of medications Call provider office for new concerns or questions  Notify RN Care Manager of TOC call rescheduling needs Participate in Transition of Care Program/Attend TOC scheduled calls Perform all self care activities independently  Take medications as prescribed   call office if I gain more than 2 pounds in one day or 5 pounds in one week track weight in diary use salt in moderation watch for swelling in feet, ankles and legs every day weigh myself daily bring diary to all appointments  Plan:  An initial telephone outreach has been scheduled for: [phone] Next PCP appointment scheduled for: [phone] Next specialist  appointment [phone] with Congestive heart clinic Telephone follow up appointment with care management team member scheduled for:  Mliss Creed [id number] 1:30       The patient has been provided with contact information for the care management team and has been advised to call with any health-related questions or concerns. The patient verbalized understanding with current POC. The patient is directed to their insurance card regarding  availability of benefits coverage    Cathlean Headland BSN RN [facility] Health Care Management Coordinator [email or web address] Direct Dial: [phone]  Fax: [phone] Website: [email or web address]

## 2024-03-04 ENCOUNTER — Ambulatory Visit (HOSPITAL_COMMUNITY)
Admit: 2024-03-04 | Discharge: 2024-03-04 | Disposition: A | Discharge status: Home / Self Care | Attending: Cardiology | Admitting: Cardiology

## 2024-03-04 ENCOUNTER — Encounter (HOSPITAL_COMMUNITY): Payer: Self-pay

## 2024-03-04 ENCOUNTER — Ambulatory Visit (HOSPITAL_COMMUNITY): Payer: Self-pay | Admitting: Cardiology

## 2024-03-04 VITALS — BP 146/87 | HR 62 | Wt 143.0 lb

## 2024-03-04 DIAGNOSIS — I480 Paroxysmal atrial fibrillation: Secondary | ICD-10-CM | POA: Diagnosis not present

## 2024-03-04 DIAGNOSIS — E785 Hyperlipidemia, unspecified: Secondary | ICD-10-CM | POA: Insufficient documentation

## 2024-03-04 DIAGNOSIS — Z7901 Long term (current) use of anticoagulants: Secondary | ICD-10-CM | POA: Diagnosis not present

## 2024-03-04 DIAGNOSIS — N183 Chronic kidney disease, stage 3 unspecified: Secondary | ICD-10-CM | POA: Insufficient documentation

## 2024-03-04 DIAGNOSIS — Z79899 Other long term (current) drug therapy: Secondary | ICD-10-CM | POA: Diagnosis not present

## 2024-03-04 DIAGNOSIS — I5022 Chronic systolic (congestive) heart failure: Secondary | ICD-10-CM | POA: Insufficient documentation

## 2024-03-04 DIAGNOSIS — I428 Other cardiomyopathies: Secondary | ICD-10-CM | POA: Diagnosis not present

## 2024-03-04 DIAGNOSIS — I130 Hypertensive heart and chronic kidney disease with heart failure and stage 1 through stage 4 chronic kidney disease, or unspecified chronic kidney disease: Secondary | ICD-10-CM | POA: Insufficient documentation

## 2024-03-04 DIAGNOSIS — I2510 Atherosclerotic heart disease of native coronary artery without angina pectoris: Secondary | ICD-10-CM | POA: Diagnosis not present

## 2024-03-04 LAB — BASIC METABOLIC PANEL WITH GFR
Anion gap: 6 (ref 5–15)
BUN: 23 mg/dL (ref 8–23)
CO2: 30 mmol/L (ref 22–32)
Calcium: 9.3 mg/dL (ref 8.9–10.3)
Chloride: 95 mmol/L — ABNORMAL LOW (ref 98–111)
Creatinine, Ser: 1.2 mg/dL — ABNORMAL HIGH (ref 0.44–1.00)
GFR, Estimated: 45 mL/min — ABNORMAL LOW
Glucose, Bld: 99 mg/dL (ref 70–99)
Potassium: 4.7 mmol/L (ref 3.5–5.1)
Sodium: 131 mmol/L — ABNORMAL LOW (ref 135–145)

## 2024-03-04 NOTE — Telephone Encounter (Signed)
 Called patient's daughter per Laymon Shed, [place] with following lab results and instructions:  SCr improving but K upper limits of normal. Will plan to keep off enalapril  for now. Instruct patient to monitor BP at home and notify cardiology clinic if SBPs persistently > 130 mmHg  Daughter voiced understanding and will relay message to patient. They may call [phone] option 2 if any questions or concerns.

## 2024-03-04 NOTE — Patient Instructions (Addendum)
 There has been no changes to your medications   Labs done today, your results will be available in MyChart, we will contact you for abnormal readings.  Thank you for allowing us  to provider your heart failure care after your recent hospitalization. Please follow-up with Dr. Lavona in 4- 6 weeks.  At the [facility], you and your health needs are our priority. As part of our continuing mission to provide you with exceptional heart care, we have created designated Provider Care Teams. These Care Teams include your primary Cardiologist (physician) and Advanced Practice Providers (APPs- Physician Assistants and Nurse Practitioners) who all work together to provide you with the care you need, when you need it.   You may see any of the following providers on your designated Care Team at your next follow up: Dr Toribio Fuel Dr Ezra Shuck Dr. Ria Commander Dr. Morene Brownie Amy Lenetta, NP Caffie Shed, [place] [facility] Andrews, [place] Beckey Coe, NP [place] Lee, NP Ellouise Class, NP Tinnie Redman, PharmD Jaun Bash, PharmD   Please be sure to bring in all your medications bottles to every appointment.    Thank you for choosing [facility]

## 2024-03-05 DIAGNOSIS — E871 Hypo-osmolality and hyponatremia: Secondary | ICD-10-CM | POA: Diagnosis not present

## 2024-03-05 DIAGNOSIS — K219 Gastro-esophageal reflux disease without esophagitis: Secondary | ICD-10-CM | POA: Diagnosis not present

## 2024-03-05 DIAGNOSIS — G629 Polyneuropathy, unspecified: Secondary | ICD-10-CM | POA: Diagnosis not present

## 2024-03-05 DIAGNOSIS — I5041 Acute combined systolic (congestive) and diastolic (congestive) heart failure: Secondary | ICD-10-CM | POA: Diagnosis not present

## 2024-03-05 DIAGNOSIS — M1712 Unilateral primary osteoarthritis, left knee: Secondary | ICD-10-CM | POA: Diagnosis not present

## 2024-03-05 DIAGNOSIS — Z7901 Long term (current) use of anticoagulants: Secondary | ICD-10-CM | POA: Diagnosis not present

## 2024-03-05 DIAGNOSIS — I480 Paroxysmal atrial fibrillation: Secondary | ICD-10-CM | POA: Diagnosis not present

## 2024-03-05 DIAGNOSIS — E785 Hyperlipidemia, unspecified: Secondary | ICD-10-CM | POA: Diagnosis not present

## 2024-03-05 DIAGNOSIS — Z602 Problems related to living alone: Secondary | ICD-10-CM | POA: Diagnosis not present

## 2024-03-05 DIAGNOSIS — N179 Acute kidney failure, unspecified: Secondary | ICD-10-CM | POA: Diagnosis not present

## 2024-03-05 DIAGNOSIS — I214 Non-ST elevation (NSTEMI) myocardial infarction: Secondary | ICD-10-CM | POA: Diagnosis not present

## 2024-03-05 DIAGNOSIS — I130 Hypertensive heart and chronic kidney disease with heart failure and stage 1 through stage 4 chronic kidney disease, or unspecified chronic kidney disease: Secondary | ICD-10-CM | POA: Diagnosis not present

## 2024-03-05 DIAGNOSIS — N1832 Chronic kidney disease, stage 3b: Secondary | ICD-10-CM | POA: Diagnosis not present

## 2024-03-05 DIAGNOSIS — Z85828 Personal history of other malignant neoplasm of skin: Secondary | ICD-10-CM | POA: Diagnosis not present

## 2024-03-05 DIAGNOSIS — Z79899 Other long term (current) drug therapy: Secondary | ICD-10-CM | POA: Diagnosis not present

## 2024-03-05 DIAGNOSIS — K589 Irritable bowel syndrome without diarrhea: Secondary | ICD-10-CM | POA: Diagnosis not present

## 2024-03-05 DIAGNOSIS — R7989 Other specified abnormal findings of blood chemistry: Secondary | ICD-10-CM | POA: Diagnosis not present

## 2024-03-06 ENCOUNTER — Telehealth: Payer: Self-pay

## 2024-03-06 NOTE — Telephone Encounter (Signed)
 Copied from CRM [id number]. Topic: Clinical - Home Health Verbal Orders >> Mar 06, 2024  8:20 AM Powell HERO wrote: Caller/Agency: [facility] Callback Number: [phone], ok to leave vm Service Requested: Physical Therapy Frequency: 1w6 Any new concerns about the patient? No

## 2024-03-07 ENCOUNTER — Encounter: Payer: Self-pay | Admitting: Family Medicine

## 2024-03-07 ENCOUNTER — Ambulatory Visit: Admitting: Family Medicine

## 2024-03-07 VITALS — BP 119/62 | HR 72 | Temp 98.2°F | Ht 64.0 in | Wt 142.0 lb

## 2024-03-07 DIAGNOSIS — I5041 Acute combined systolic (congestive) and diastolic (congestive) heart failure: Secondary | ICD-10-CM

## 2024-03-07 DIAGNOSIS — I480 Paroxysmal atrial fibrillation: Secondary | ICD-10-CM

## 2024-03-07 DIAGNOSIS — Z09 Encounter for follow-up examination after completed treatment for conditions other than malignant neoplasm: Secondary | ICD-10-CM

## 2024-03-07 DIAGNOSIS — I214 Non-ST elevation (NSTEMI) myocardial infarction: Secondary | ICD-10-CM

## 2024-03-07 NOTE — Telephone Encounter (Signed)
 Orders provided. LS

## 2024-03-07 NOTE — Telephone Encounter (Signed)
 Yes of course

## 2024-03-07 NOTE — Progress Notes (Signed)
 Subjective: CC: Hospital discharge follow-up for CHF PCP: Jolinda Kimberly HERO, DO YEP:Kimberly Suarez is a 83 y.o. female who is accompanied to today's visit by her granddaughter.  She is presenting to clinic today for:  1.  Acute on chronic combined CHF Patient was admitted and found to have acute on chronic combined CHF with a EF reduction to 35%.  She was also found to have atrial fibrillation and was taken off of beta-blocker due to bradycardia and transitioned over to amiodarone .  Additionally, NSTEMI and ultimately had cardiac cath which demonstrated no significant CAD.  Cardiology appointment scheduled for 04/24/2024 with Dr. Lavona.  She denies any ongoing fluid retention.  She is been monitoring weights very closely and she stays around 139.6 pounds.  She will use Lasix  if she increases but has not needed this at all because she has stayed in the green zone.  She has been compliant with Eliquis  and amiodarone .  She denies any bleeding player she denies any heart palpitations but notes that she did not even feel when she was having episodes of atrial fibrillation to begin with.   ROS: Per HPI  No Known Allergies Past Medical History:  Diagnosis Date   Allergy    Arthritis    ASCUS (atypical squamous cells of undetermined significance) on Pap smear 2011x2   Negative high-risk HPV, normal Pap smears 2012/2013   Atrial fibrillation (HCC)    Atypical nevus 05/02/2007   right back-moderate    Cancer (HCC)    Cataract    Diverticulosis    GERD (gastroesophageal reflux disease)    Hiatal hernia    Hyperlipemia    Hypertension    IBS (irritable bowel syndrome)    Kidney disease    Lymphocytic colitis 2007   Neuropathy    SCC (squamous cell carcinoma) 01/19/2006   bridge of nose (CX35FU)   SCC (squamous cell carcinoma) 03/08/2017   right inner lower shin (CX35FU)   SCC (squamous cell carcinoma) x 2 02/24/2002   right bridge of nose (Cx35FU),right bridge of nose   Uterine  prolapse    Pessary    Current Outpatient Medications:    acetaminophen  (TYLENOL ) 500 MG tablet, Take 2 tablets (1,000 mg total) by mouth 3 (three) times daily., Disp: 180 tablet, Rfl: PRN   Alpha-Lipoic Acid 600 MG CAPS, Take by mouth., Disp: , Rfl:    amiodarone  (PACERONE ) 200 MG tablet, Take 1 tablet (200 mg total) by mouth 2 (two) times daily for 14 days, THEN 1 tablet (200 mg total) daily., Disp: 88 tablet, Rfl: 0   apixaban  (ELIQUIS ) 5 MG TABS tablet, Take 1 tablet (5 mg total) by mouth 2 (two) times daily for afib, Disp: 60 tablet, Rfl: 0   atorvastatin  (LIPITOR) 40 MG tablet, TAKE 1 TABLET BY MOUTH DAILY, Disp: 100 tablet, Rfl: 1   fluticasone  (FLONASE ) 50 MCG/ACT nasal spray, Place 1 spray into both nostrils 2 (two) times daily as needed for allergies or rhinitis., Disp: 16 g, Rfl: 6   furosemide  (LASIX ) 40 MG tablet, Take 1 tablet (40 mg total) by mouth daily as needed, if gains more than 3 pounds in 1 day or 5 pounds in 1 week, Disp: 30 tablet, Rfl: 0   gabapentin  (NEURONTIN ) 100 MG capsule, TAKE 3 CAPSULES BY MOUTH AT  BEDTIME, Disp: 300 capsule, Rfl: 1   Glucosamine-Chondroitin (GLUCOSAMINE CHONDR COMPLEX PO), Take 1 tablet by mouth 2 (two) times daily., Disp: , Rfl:    Melatonin 10 MG CAPS, Take  by mouth at bedtime and may repeat dose one time if needed., Disp: , Rfl:    Multiple Vitamins-Minerals (MULTIVITAMIN ADULT PO), Take 1 tablet by mouth daily., Disp: , Rfl:    Omega-3 Fatty Acids (FISH OIL) 1000 MG CAPS, Take 1 capsule by mouth daily., Disp: , Rfl:    saccharomyces boulardii (FLORASTOR) 250 MG capsule, Take 250 mg by mouth 2 (two) times daily., Disp: , Rfl:    TURMERIC CURCUMIN PO, Take 1,500 mg by mouth 2 (two) times daily., Disp: , Rfl:    vitamin B-12 (CYANOCOBALAMIN) 500 MCG tablet, Take 500 mcg by mouth daily., Disp: , Rfl:    VITAMIN D  PO, Take by mouth., Disp: , Rfl:  Social History   Socioeconomic History   Marital status: Widowed    Spouse name: Not on file    Number of children: 3   Years of education: Not on file   Highest education level: 8th grade  Occupational History   Occupation: Retired   Tobacco Use   Smoking status: Never   Smokeless tobacco: Never  Vaping Use   Vaping status: Never Used  Substance and Sexual Activity   Alcohol use: Not Currently   Drug use: No   Sexual activity: Not Currently    Birth control/protection: Post-menopausal    Comment: 1st intercourse 15 yo--1 partner  Other Topics Concern   Not on file  Social History Narrative   Lives at home with husband.   Right-handed.   Caffeine use: one cup caffeine some days.   Social Drivers of Corporate investment banker Strain: Low Risk  (02/26/2024)   Overall Financial Resource Strain (CARDIA)    Difficulty of Paying Living Expenses: Not very hard  Food Insecurity: No Food Insecurity (03/03/2024)   Hunger Vital Sign    Worried About Running Out of Food in the Last Year: Never true    Ran Out of Food in the Last Year: Never true  Transportation Needs: No Transportation Needs (03/03/2024)   PRAPARE - Administrator, Civil Service (Medical): No    Lack of Transportation (Non-Medical): No  Physical Activity: Insufficiently Active (10/09/2018)   Exercise Vital Sign    Days of Exercise per Week: 2 days    Minutes of Exercise per Session: 30 min  Stress: No Stress Concern Present (10/09/2018)   [employer] of Occupational Health - Occupational Stress Questionnaire    Feeling of Stress : Not at all  Social Connections: Moderately Isolated (02/24/2024)   Social Connection and Isolation Panel    Frequency of Communication with Friends and Family: More than three times a week    Frequency of Social Gatherings with Friends and Family: More than three times a week    Attends Religious Services: More than 4 times per year    Active Member of [employer] or Organizations: No    Attends Banker Meetings: Never    Marital Status: Widowed  Intimate Partner  Violence: Not At Risk (03/03/2024)   Humiliation, Afraid, Rape, and Kick questionnaire    Fear of Current or Ex-Partner: No    Emotionally Abused: No    Physically Abused: No    Sexually Abused: No   Family History  Problem Relation Age of Onset   Heart disease Mother    COPD Father    Asthma Father    Alzheimer's disease Sister    Multiple sclerosis Sister    Heart disease Sister    Diabetes Brother  BKA   Alzheimer's disease Brother    Diabetes Brother    Early death Brother        died in war   Stomach cancer Paternal Aunt    Colon cancer Neg Hx    Kidney disease Neg Hx    Liver disease Neg Hx    Esophageal cancer Neg Hx    Rectal cancer Neg Hx     Objective: Office vital signs reviewed. BP 119/62   Pulse 72   Temp 98.2 F (36.8 C) (Temporal)   Ht 5' 4 (1.626 m)   Wt 142 lb (64.4 kg)   SpO2 95%   BMI 24.37 kg/m   Physical Examination:  General: Awake, alert, well nourished, No acute distress HEENT: Sclera white.  Moist mucous membranes.  No JVD Cardio: regular rate and rhythm, S1S2 heard, no murmurs appreciated Pulm: clear to auscultation bilaterally, no wheezes, rhonchi or rales; normal work of breathing on room air Extremities: warm, well perfused, No edema, cyanosis or clubbing; +2 pulses bilaterally    Assessment/ Plan: 83 y.o. female   Acute combined systolic and diastolic congestive heart failure (HCC)  NSTEMI (non-ST elevated myocardial infarction) [facility])  Hospital discharge follow-up  Paroxysmal atrial fibrillation (HCC)  No evidence of ongoing fluid overload.  She seems to be pretty well informed as to how to manage and utilize her home medications.  Does not tell like she is having any issues affording Eliquis  but I did encourage her to contact me should she have any issues.  Keep follow-up with cardiologist in August.  Plan for thyroid  levels at some point given use of amiodarone   No orders of the defined types were placed in this  encounter.  No orders of the defined types were placed in this encounter.   Today's visit is for Transitional Care Management.  The patient was discharged from [facility] on 02/28/2024 with a primary diagnosis of acute congestive heart failure.   Contact with the patient and/or caregiver, by a clinical staff member, was made on 03/03/2024 and was documented as a telephone encounter within the EMR.  Through chart review and discussion with the patient I have determined that management of their condition is of moderate complexity.    Kimberly CHRISTELLA Fielding, DO [facility] [phone]

## 2024-03-10 DIAGNOSIS — E785 Hyperlipidemia, unspecified: Secondary | ICD-10-CM | POA: Diagnosis not present

## 2024-03-10 DIAGNOSIS — D472 Monoclonal gammopathy: Secondary | ICD-10-CM | POA: Diagnosis not present

## 2024-03-10 DIAGNOSIS — K219 Gastro-esophageal reflux disease without esophagitis: Secondary | ICD-10-CM | POA: Diagnosis not present

## 2024-03-10 DIAGNOSIS — I5043 Acute on chronic combined systolic (congestive) and diastolic (congestive) heart failure: Secondary | ICD-10-CM | POA: Diagnosis not present

## 2024-03-10 DIAGNOSIS — M1990 Unspecified osteoarthritis, unspecified site: Secondary | ICD-10-CM | POA: Diagnosis not present

## 2024-03-10 DIAGNOSIS — R7303 Prediabetes: Secondary | ICD-10-CM | POA: Diagnosis not present

## 2024-03-10 DIAGNOSIS — I129 Hypertensive chronic kidney disease with stage 1 through stage 4 chronic kidney disease, or unspecified chronic kidney disease: Secondary | ICD-10-CM | POA: Diagnosis not present

## 2024-03-10 DIAGNOSIS — N1831 Chronic kidney disease, stage 3a: Secondary | ICD-10-CM | POA: Diagnosis not present

## 2024-03-11 ENCOUNTER — Other Ambulatory Visit: Payer: Self-pay | Admitting: *Deleted

## 2024-03-11 NOTE — Patient Outreach (Signed)
 Transition of Care week 2  Visit Note  03/11/2024  Name: Kimberly Suarez MRN: [id number]          DOB: 10-01-1940  Situation: Patient enrolled in [facility] 30-day program. Visit completed with patient by telephone.   Background:    Past Medical History:  Diagnosis Date   Allergy    Arthritis    ASCUS (atypical squamous cells of undetermined significance) on Pap smear 2011x2   Negative high-risk HPV, normal Pap smears 2012/2013   Atrial fibrillation (HCC)    Atypical nevus 05/02/2007   right back-moderate    Cancer (HCC)    Cataract    Diverticulosis    GERD (gastroesophageal reflux disease)    Hiatal hernia    Hyperlipemia    Hypertension    IBS (irritable bowel syndrome)    Kidney disease    Lymphocytic colitis 2007   Neuropathy    SCC (squamous cell carcinoma) 01/19/2006   bridge of nose (CX35FU)   SCC (squamous cell carcinoma) 03/08/2017   right inner lower shin (CX35FU)   SCC (squamous cell carcinoma) x 2 02/24/2002   right bridge of nose (Cx35FU),right bridge of nose   Uterine prolapse    Pessary    Assessment: Patient Reported Symptoms: Cognitive Cognitive Status: No symptoms reported, Able to follow simple commands, Normal speech and language skills   Health Maintenance Behaviors: Annual physical exam Healing Pattern: Unsure Health Facilitated by: Healthy diet, Rest  Neurological Neurological Review of Symptoms: No symptoms reported Neurological Management Strategies: Medication therapy Neurological Self-Management Outcome: 4 (good) Neurological Comment: pt denies any numbness today, states gabapentin  helps  HEENT HEENT Symptoms Reported: No symptoms reported      Cardiovascular Cardiovascular Symptoms Reported: No symptoms reported Does patient have uncontrolled Hypertension?: No Cardiovascular Management Strategies: Adequate rest, Medication therapy Weight: 137 lb 9.6 oz (62.4 kg) Cardiovascular Self-Management Outcome: 4 (good) Cardiovascular Comment:  HF- reviewed HF action plan, importance of continuing daily weights  Respiratory Respiratory Symptoms Reported: No symptoms reported    Endocrine Endocrine Symptoms Reported: No symptoms reported    Gastrointestinal Gastrointestinal Symptoms Reported: No symptoms reported      Genitourinary Genitourinary Symptoms Reported: No symptoms reported    Integumentary Integumentary Symptoms Reported: No symptoms reported    Musculoskeletal Musculoskelatal Symptoms Reviewed: Unsteady gait Musculoskeletal Management Strategies: Medical device Musculoskeletal Self-Management Outcome: 4 (good) Musculoskeletal Comment: uses walker, continues to work with home health PT      Psychosocial Psychosocial Symptoms Reported: No symptoms reported         There were no vitals filed for this visit.  Medications Reviewed Today     Reviewed by Aura Mliss LABOR, RN (Registered Nurse) on 03/11/24 at 1405  Med List Status: <None>   Medication Order Taking? Sig Documenting Provider Last Dose Status Informant  acetaminophen  (TYLENOL ) 500 MG tablet [id number] Yes Take 2 tablets (1,000 mg total) by mouth 3 (three) times daily. Zollie Lowers, MD  Active Self, Pharmacy Records  Alpha-Lipoic Acid 600 MG CAPS [id number] Yes Take by mouth. [provider]  Active Self, Pharmacy Records  amiodarone  (PACERONE ) 200 MG tablet [id number] Yes Take 1 tablet (200 mg total) by mouth 2 (two) times daily for 14 days, THEN 1 tablet (200 mg total) daily. Krishnan, Gokul, MD  Active   apixaban  (ELIQUIS ) 5 MG TABS tablet [id number] Yes Take 1 tablet (5 mg total) by mouth 2 (two) times daily for afib Jolinda Norene HERO, DO  Active Self, Pharmacy Records  atorvastatin  (LIPITOR) 40 MG  tablet [id number] Yes TAKE 1 TABLET BY MOUTH DAILY Jolinda Norene HERO, DO  Active Self, Pharmacy Records  fluticasone  (FLONASE ) 50 MCG/ACT nasal spray [id number] Yes Place 1 spray into both nostrils 2 (two) times daily as needed for allergies or  rhinitis. Lavell Bari LABOR, FNP  Active Self, Pharmacy Records  furosemide  (LASIX ) 40 MG tablet [id number] Yes Take 1 tablet (40 mg total) by mouth daily as needed, if gains more than 3 pounds in 1 day or 5 pounds in 1 week Krishnan, Gokul, MD  Active   gabapentin  (NEURONTIN ) 100 MG capsule [id number] Yes TAKE 3 CAPSULES BY MOUTH AT  BEDTIME Jolinda Norene HERO, DO  Active Self, Pharmacy Records  Glucosamine-Chondroitin (GLUCOSAMINE CHONDR COMPLEX PO) [id number] Yes Take 1 tablet by mouth 2 (two) times daily. [provider]  Active Self, Pharmacy Records           Med Note JUSTINO, [facility]   Thu Jun 01, 2016 10:14 AM)    Melatonin 10 MG CAPS [id number] Yes Take by mouth at bedtime and may repeat dose one time if needed. [provider]  Active Self, Pharmacy Records  Multiple Vitamins-Minerals (MULTIVITAMIN ADULT PO) [id number] Yes Take 1 tablet by mouth daily. [provider]  Active Self, Pharmacy Records  Omega-3 Fatty Acids (FISH OIL) 1000 MG CAPS [id number] Yes Take 1 capsule by mouth daily. [provider]  Active Self, Pharmacy Records           Med Note JUSTINO, [facility]   Thu Jun 01, 2016 10:15 AM)    saccharomyces boulardii (FLORASTOR) 250 MG capsule [id number] Yes Take 250 mg by mouth 2 (two) times daily. [provider]  Active Self, Pharmacy Records  TURMERIC CURCUMIN PO [id number] Yes Take 1,500 mg by mouth 2 (two) times daily. [provider]  Active Self, Pharmacy Records  vitamin B-12 (CYANOCOBALAMIN) 500 MCG tablet [id number] Yes Take 500 mcg by mouth daily. [provider]  Active Self, Pharmacy Records  VITAMIN D  PO [id number] Yes Take by mouth. [provider]  Active Self, Pharmacy Records            Recommendation:   PCP Follow-up  Follow Up Plan:   Telephone follow-up 03/18/24 @ 2 pm  Mliss Creed [facility], BSN RN Care Manager/ Transition of Care [place]/ [facility] [phone]

## 2024-03-11 NOTE — Patient Instructions (Signed)
 Visit Information  Thank you for taking time to visit with me today. Please don't hesitate to contact me if I can be of assistance to you before our next scheduled telephone appointment.  Our next appointment is by telephone on 03/18/24 @ 2 pm  Following is a copy of your care plan:   Goals Addressed             This Visit's Progress    [facility] Transitions of Care (TOC) Care Plan       Problems:  Recent Hospitalization for treatment of CHF Knowledge Deficit Related to Congestive Heart Failure Patient reports she continues weighing daily, is working with [facility] PT Pt saw nephrologist on 03/10/24 - [facility] in [place]  Goal:  Over the next 30 days, the patient will not experience hospital readmission  Interventions:   Heart Failure Interventions: Discussed importance of daily weight and advised patient to weigh and record daily Reviewed role of diuretics in prevention of fluid overload and management of heart failure; Discussed the importance of keeping all appointments with provider Reviewed HF action plan, importance of reporting change in health status/ symptoms  Reviewed importance of taking medications as prescribed, reviewed medications and purpose Reviewed all upcoming scheduled appointments  Patient Self Care Activities:  Attend all scheduled provider appointments Call pharmacy for medication refills 3-7 days in advance of running out of medications Call provider office for new concerns or questions  Notify RN Care Manager of TOC call rescheduling needs Participate in Transition of Care Program/Attend TOC scheduled calls Perform all self care activities independently  Take medications as prescribed   call office if I gain more than 2 pounds in one day or 5 pounds in one week track weight in diary use salt in moderation watch for swelling in feet, ankles and legs every day weigh myself daily bring diary to all appointments Continue working  with home health PT  Plan:  Telephone follow up appointment with care management team member scheduled for:  Mliss Creed 03/18/24 @ 2 pm        Patient verbalizes understanding of instructions and care plan provided today and agrees to view in MyChart. Active MyChart status and patient understanding of how to access instructions and care plan via MyChart confirmed with patient.     Telephone follow up appointment with care management team member scheduled for: 03/18/24 @ 2 pm  Please call the care guide team at [phone] if you need to cancel or reschedule your appointment.   Please call the Suicide and Crisis Lifeline: 988 call the [place]  National Suicide Prevention Lifeline: (901)760-1798 or TTY: 941-015-4596 TTY (574)296-7252) to talk to a trained counselor call 1-800-273-TALK (toll free, 24 hour hotline) go to [facility] [address], [place] [phone]) call 911 if you are experiencing a Mental Health or Behavioral Health Crisis or need someone to talk to.  Mliss Creed [facility], BSN RN Care Manager/ Transition of Care [place]/ [facility] [phone]

## 2024-03-13 DIAGNOSIS — K589 Irritable bowel syndrome without diarrhea: Secondary | ICD-10-CM | POA: Diagnosis not present

## 2024-03-13 DIAGNOSIS — Z7901 Long term (current) use of anticoagulants: Secondary | ICD-10-CM | POA: Diagnosis not present

## 2024-03-13 DIAGNOSIS — I5041 Acute combined systolic (congestive) and diastolic (congestive) heart failure: Secondary | ICD-10-CM | POA: Diagnosis not present

## 2024-03-13 DIAGNOSIS — I480 Paroxysmal atrial fibrillation: Secondary | ICD-10-CM | POA: Diagnosis not present

## 2024-03-13 DIAGNOSIS — E785 Hyperlipidemia, unspecified: Secondary | ICD-10-CM | POA: Diagnosis not present

## 2024-03-13 DIAGNOSIS — Z85828 Personal history of other malignant neoplasm of skin: Secondary | ICD-10-CM | POA: Diagnosis not present

## 2024-03-13 DIAGNOSIS — Z79899 Other long term (current) drug therapy: Secondary | ICD-10-CM | POA: Diagnosis not present

## 2024-03-13 DIAGNOSIS — M1712 Unilateral primary osteoarthritis, left knee: Secondary | ICD-10-CM | POA: Diagnosis not present

## 2024-03-13 DIAGNOSIS — Z602 Problems related to living alone: Secondary | ICD-10-CM | POA: Diagnosis not present

## 2024-03-13 DIAGNOSIS — G629 Polyneuropathy, unspecified: Secondary | ICD-10-CM | POA: Diagnosis not present

## 2024-03-13 DIAGNOSIS — N179 Acute kidney failure, unspecified: Secondary | ICD-10-CM | POA: Diagnosis not present

## 2024-03-13 DIAGNOSIS — I130 Hypertensive heart and chronic kidney disease with heart failure and stage 1 through stage 4 chronic kidney disease, or unspecified chronic kidney disease: Secondary | ICD-10-CM | POA: Diagnosis not present

## 2024-03-13 DIAGNOSIS — K219 Gastro-esophageal reflux disease without esophagitis: Secondary | ICD-10-CM | POA: Diagnosis not present

## 2024-03-13 DIAGNOSIS — N1832 Chronic kidney disease, stage 3b: Secondary | ICD-10-CM | POA: Diagnosis not present

## 2024-03-13 DIAGNOSIS — I214 Non-ST elevation (NSTEMI) myocardial infarction: Secondary | ICD-10-CM | POA: Diagnosis not present

## 2024-03-13 DIAGNOSIS — E871 Hypo-osmolality and hyponatremia: Secondary | ICD-10-CM | POA: Diagnosis not present

## 2024-03-13 DIAGNOSIS — R7989 Other specified abnormal findings of blood chemistry: Secondary | ICD-10-CM | POA: Diagnosis not present

## 2024-03-18 ENCOUNTER — Inpatient Hospital Stay: Admitting: Nurse Practitioner

## 2024-03-18 ENCOUNTER — Other Ambulatory Visit: Payer: Self-pay | Admitting: *Deleted

## 2024-03-18 DIAGNOSIS — I214 Non-ST elevation (NSTEMI) myocardial infarction: Secondary | ICD-10-CM | POA: Diagnosis not present

## 2024-03-18 DIAGNOSIS — E785 Hyperlipidemia, unspecified: Secondary | ICD-10-CM | POA: Diagnosis not present

## 2024-03-18 DIAGNOSIS — E871 Hypo-osmolality and hyponatremia: Secondary | ICD-10-CM | POA: Diagnosis not present

## 2024-03-18 DIAGNOSIS — I5041 Acute combined systolic (congestive) and diastolic (congestive) heart failure: Secondary | ICD-10-CM | POA: Diagnosis not present

## 2024-03-18 DIAGNOSIS — I480 Paroxysmal atrial fibrillation: Secondary | ICD-10-CM | POA: Diagnosis not present

## 2024-03-18 DIAGNOSIS — K589 Irritable bowel syndrome without diarrhea: Secondary | ICD-10-CM | POA: Diagnosis not present

## 2024-03-18 DIAGNOSIS — M1712 Unilateral primary osteoarthritis, left knee: Secondary | ICD-10-CM | POA: Diagnosis not present

## 2024-03-18 DIAGNOSIS — I130 Hypertensive heart and chronic kidney disease with heart failure and stage 1 through stage 4 chronic kidney disease, or unspecified chronic kidney disease: Secondary | ICD-10-CM | POA: Diagnosis not present

## 2024-03-18 DIAGNOSIS — N1832 Chronic kidney disease, stage 3b: Secondary | ICD-10-CM | POA: Diagnosis not present

## 2024-03-18 DIAGNOSIS — Z79899 Other long term (current) drug therapy: Secondary | ICD-10-CM | POA: Diagnosis not present

## 2024-03-18 DIAGNOSIS — Z602 Problems related to living alone: Secondary | ICD-10-CM | POA: Diagnosis not present

## 2024-03-18 DIAGNOSIS — R7989 Other specified abnormal findings of blood chemistry: Secondary | ICD-10-CM | POA: Diagnosis not present

## 2024-03-18 DIAGNOSIS — Z85828 Personal history of other malignant neoplasm of skin: Secondary | ICD-10-CM | POA: Diagnosis not present

## 2024-03-18 DIAGNOSIS — G629 Polyneuropathy, unspecified: Secondary | ICD-10-CM | POA: Diagnosis not present

## 2024-03-18 DIAGNOSIS — Z7901 Long term (current) use of anticoagulants: Secondary | ICD-10-CM | POA: Diagnosis not present

## 2024-03-18 DIAGNOSIS — K219 Gastro-esophageal reflux disease without esophagitis: Secondary | ICD-10-CM | POA: Diagnosis not present

## 2024-03-18 DIAGNOSIS — N179 Acute kidney failure, unspecified: Secondary | ICD-10-CM | POA: Diagnosis not present

## 2024-03-18 NOTE — Patient Outreach (Signed)
 Transition of Care week 3  Visit Note  03/18/2024  Name: Kimberly Suarez MRN: [id number]          DOB: [birth year]  Situation: Patient enrolled in [facility] 30-day program. Visit completed with patient by telephone.   Background:   Past Medical History:  Diagnosis Date   Allergy    Arthritis    ASCUS (atypical squamous cells of undetermined significance) on Pap smear 2011x2   Negative high-risk HPV, normal Pap smears 2012/2013   Atrial fibrillation (HCC)    Atypical nevus 05/02/2007   right back-moderate    Cancer (HCC)    Cataract    Diverticulosis    GERD (gastroesophageal reflux disease)    Hiatal hernia    Hyperlipemia    Hypertension    IBS (irritable bowel syndrome)    Kidney disease    Lymphocytic colitis 2007   Neuropathy    SCC (squamous cell carcinoma) 01/19/2006   bridge of nose (CX35FU)   SCC (squamous cell carcinoma) 03/08/2017   right inner lower shin (CX35FU)   SCC (squamous cell carcinoma) x 2 02/24/2002   right bridge of nose (Cx35FU),right bridge of nose   Uterine prolapse    Pessary    Assessment: Patient Reported Symptoms: Cognitive Cognitive Status: No symptoms reported, Able to follow simple commands, Alert and oriented to person, place, and time      Neurological Neurological Review of Symptoms: No symptoms reported    HEENT HEENT Symptoms Reported: No symptoms reported      Cardiovascular Cardiovascular Symptoms Reported: No symptoms reported Does patient have uncontrolled Hypertension?: No Cardiovascular Management Strategies: Adequate rest, Medication therapy Weight: 137 lb (62.1 kg) Cardiovascular Self-Management Outcome: 4 (good) Cardiovascular Comment: Reinforced HF action plan  Respiratory Respiratory Symptoms Reported: No symptoms reported    Endocrine Endocrine Symptoms Reported: No symptoms reported Is patient diabetic?: No    Gastrointestinal Gastrointestinal Symptoms Reported: No symptoms reported      Genitourinary  Genitourinary Symptoms Reported: No symptoms reported    Integumentary Integumentary Symptoms Reported: No symptoms reported    Musculoskeletal Musculoskelatal Symptoms Reviewed: Unsteady gait Musculoskeletal Management Strategies: Medical device Musculoskeletal Self-Management Outcome: 4 (good) Musculoskeletal Comment: continues to work with home health PT, uses walker      Psychosocial Psychosocial Symptoms Reported: No symptoms reported         There were no vitals filed for this visit.  Medications Reviewed Today     Reviewed by Aura Mliss LABOR, RN (Registered Nurse) on 03/18/24 at 1428  Med List Status: <None>   Medication Order Taking? Sig Documenting Provider Last Dose Status Informant  acetaminophen  (TYLENOL ) 500 MG tablet [id number]  Take 2 tablets (1,000 mg total) by mouth 3 (three) times daily. Zollie Lowers, MD  Active Self, Pharmacy Records  Alpha-Lipoic Acid 600 MG CAPS [id number]  Take by mouth. [provider]  Active Self, Pharmacy Records  amiodarone  (PACERONE ) 200 MG tablet [id number]  Take 1 tablet (200 mg total) by mouth 2 (two) times daily for 14 days, THEN 1 tablet (200 mg total) daily. Krishnan, Gokul, MD  Active   apixaban  (ELIQUIS ) 5 MG TABS tablet [id number]  Take 1 tablet (5 mg total) by mouth 2 (two) times daily for afib Jolinda Norene HERO, DO  Active Self, Pharmacy Records  atorvastatin  (LIPITOR) 40 MG tablet [id number]  TAKE 1 TABLET BY MOUTH DAILY Jolinda Norene HERO, DO  Active Self, Pharmacy Records  fluticasone  (FLONASE ) 50 MCG/ACT nasal spray [id number]  Place 1 spray into both nostrils  2 (two) times daily as needed for allergies or rhinitis. Lavell Bari LABOR, FNP  Active Self, Pharmacy Records  furosemide  (LASIX ) 40 MG tablet [id number]  Take 1 tablet (40 mg total) by mouth daily as needed, if gains more than 3 pounds in 1 day or 5 pounds in 1 week Krishnan, Gokul, MD  Active   gabapentin  (NEURONTIN ) 100 MG capsule [id number]  TAKE 3 CAPSULES BY  MOUTH AT  BEDTIME Jolinda Norene HERO, DO  Active Self, Pharmacy Records  Glucosamine-Chondroitin (GLUCOSAMINE CHONDR COMPLEX PO) [id number]  Take 1 tablet by mouth 2 (two) times daily. [provider]  Active Self, Pharmacy Records           Med Note JUSTINO, [facility]   Thu Jun 01, 2016 10:14 AM)    Melatonin 10 MG CAPS [id number]  Take by mouth at bedtime and may repeat dose one time if needed. [provider]  Active Self, Pharmacy Records  Multiple Vitamins-Minerals (MULTIVITAMIN ADULT PO) [id number]  Take 1 tablet by mouth daily. [provider]  Active Self, Pharmacy Records  Omega-3 Fatty Acids (FISH OIL) 1000 MG CAPS [id number]  Take 1 capsule by mouth daily. [provider]  Active Self, Pharmacy Records           Med Note JUSTINO, [facility]   Thu Jun 01, 2016 10:15 AM)    saccharomyces boulardii (FLORASTOR) 250 MG capsule [id number]  Take 250 mg by mouth 2 (two) times daily. [provider]  Active Self, Pharmacy Records  TURMERIC CURCUMIN PO [id number]  Take 1,500 mg by mouth 2 (two) times daily. [provider]  Active Self, Pharmacy Records  vitamin B-12 (CYANOCOBALAMIN) 500 MCG tablet [id number]  Take 500 mcg by mouth daily. [provider]  Active Self, Pharmacy Records  VITAMIN D  PO [id number]  Take by mouth. [provider]  Active Self, Pharmacy Records            Goals Addressed             This Visit's Progress    VBCI Transitions of Care (TOC) Care Plan       Problems:  Recent Hospitalization for treatment of CHF Knowledge Deficit Related to Congestive Heart Failure Patient reports she continues weighing daily, is working with [facility] PT, weight today 137 pounds, no new concerns reported Goal:  Over the next 30 days, the patient will not experience hospital readmission  Interventions:  Heart Failure Interventions: Discussed importance of daily weight and advised patient to weigh and record  daily Reviewed role of diuretics in prevention of fluid overload and management of heart failure; Discussed the importance of keeping all appointments with provider Reinforced HF action plan, importance of reporting change in health status/ symptoms  Reinforced importance of taking medications as prescribed, reviewed medications and purpose Reviewed all upcoming scheduled appointments Reviewed safety precautions  Patient Self Care Activities:  Attend all scheduled provider appointments Call pharmacy for medication refills 3-7 days in advance of running out of medications Call provider office for new concerns or questions  Notify RN Care Manager of TOC call rescheduling needs Participate in Transition of Care Program/Attend TOC scheduled calls Perform all self care activities independently  Take medications as prescribed   call office if I gain more than 2 pounds in one day or 5 pounds in one week track weight in diary use salt in moderation watch for swelling in feet, ankles and legs every day weigh myself daily bring diary to all appointments  Continue working with home health PT  Plan:  Telephone follow up appointment with care management team member scheduled for:  Mliss Creed 04/02/24 @ 1015 am (will not call week of 7/28, pt is awaiting call back from dentist for appointment and not sure what day, prefers a call week of 03/31/24)         Recommendation:   PCP Follow-up Continue Current Plan of Care  Follow Up Plan:   Telephone follow-up 04/02/24 @ 1015 (will not call week of 7/28 pt preference, has dental and other appointments)  Mliss Creed [facility], BSN RN Care Manager/ Transition of Care [place]/ [facility] [phone]

## 2024-03-18 NOTE — Patient Instructions (Signed)
 Visit Information  Thank you for taking time to visit with me today. Please don't hesitate to contact me if I can be of assistance to you before our next scheduled telephone appointment.  Our next appointment is by telephone on 04/02/24 at 1015 am  Following is a copy of your care plan:   Goals Addressed             This Visit's Progress    VBCI Transitions of Care (TOC) Care Plan       Problems:  Recent Hospitalization for treatment of CHF Knowledge Deficit Related to Congestive Heart Failure Patient reports she continues weighing daily, is working with [facility] PT, weight today 137 pounds, no new concerns reported Goal:  Over the next 30 days, the patient will not experience hospital readmission  Interventions:  Heart Failure Interventions: Discussed importance of daily weight and advised patient to weigh and record daily Reviewed role of diuretics in prevention of fluid overload and management of heart failure; Discussed the importance of keeping all appointments with provider Reinforced HF action plan, importance of reporting change in health status/ symptoms  Reinforced importance of taking medications as prescribed, reviewed medications and purpose Reviewed all upcoming scheduled appointments Reviewed safety precautions  Patient Self Care Activities:  Attend all scheduled provider appointments Call pharmacy for medication refills 3-7 days in advance of running out of medications Call provider office for new concerns or questions  Notify RN Care Manager of TOC call rescheduling needs Participate in Transition of Care Program/Attend TOC scheduled calls Perform all self care activities independently  Take medications as prescribed   call office if I gain more than 2 pounds in one day or 5 pounds in one week track weight in diary use salt in moderation watch for swelling in feet, ankles and legs every day weigh myself daily bring diary to all  appointments Continue working with home health PT  Plan:  Telephone follow up appointment with care management team member scheduled for:  Mliss Creed 04/02/24 @ 1015 am (will not call week of 7/28, pt is awaiting call back from dentist for appointment and not sure what day, prefers a call week of 03/31/24)        Patient verbalizes understanding of instructions and care plan provided today and agrees to view in MyChart. Active MyChart status and patient understanding of how to access instructions and care plan via MyChart confirmed with patient.     Telephone follow up appointment with care management team member scheduled for: 04/02/24 @ 1015 am  Please call the care guide team at [phone] if you need to cancel or reschedule your appointment.   Please call the Suicide and Crisis Lifeline: 988 call the [place]  National Suicide Prevention Lifeline: (646) 837-0114 or TTY: (518) 015-9148 TTY (470)715-6975) to talk to a trained counselor call 1-800-273-TALK (toll free, 24 hour hotline) go to [facility] [address] [place] [phone]) call 911 if you are experiencing a Mental Health or Behavioral Health Crisis or need someone to talk to.  Mckynleigh Mussell RNC, BSN RN Care Manager/ Transition of Care [place]/ [facility] [phone]    Visit Information  Thank you for taking time to visit with me today. Please don't hesitate to contact me if I can be of assistance to you before our next scheduled telephone appointment.  Our next appointment is by telephone on 04/02/24 @ 1015 am  Following is a copy of your care plan:   Goals  Addressed             This Visit's Progress    VBCI Transitions of Care (TOC) Care Plan       Problems:  Recent Hospitalization for treatment of CHF Knowledge Deficit Related to Congestive Heart Failure Patient reports she continues weighing daily, is working with [facility] PT, weight today  137 pounds, no new concerns reported Goal:  Over the next 30 days, the patient will not experience hospital readmission  Interventions:  Heart Failure Interventions: Discussed importance of daily weight and advised patient to weigh and record daily Reviewed role of diuretics in prevention of fluid overload and management of heart failure; Discussed the importance of keeping all appointments with provider Reinforced HF action plan, importance of reporting change in health status/ symptoms  Reinforced importance of taking medications as prescribed, reviewed medications and purpose Reviewed all upcoming scheduled appointments Reviewed safety precautions  Patient Self Care Activities:  Attend all scheduled provider appointments Call pharmacy for medication refills 3-7 days in advance of running out of medications Call provider office for new concerns or questions  Notify RN Care Manager of TOC call rescheduling needs Participate in Transition of Care Program/Attend TOC scheduled calls Perform all self care activities independently  Take medications as prescribed   call office if I gain more than 2 pounds in one day or 5 pounds in one week track weight in diary use salt in moderation watch for swelling in feet, ankles and legs every day weigh myself daily bring diary to all appointments Continue working with home health PT  Plan:  Telephone follow up appointment with care management team member scheduled for:  Mliss Creed 04/02/24 @ 1015 am (will not call week of 7/28, pt is awaiting call back from dentist for appointment and not sure what day, prefers a call week of 03/31/24)        Patient verbalizes understanding of instructions and care plan provided today and agrees to view in MyChart. Active MyChart status and patient understanding of how to access instructions and care plan via MyChart confirmed with patient.     Telephone follow up appointment with care management team member  scheduled for:  Please call the care guide team at [phone] if you need to cancel or reschedule your appointment.   Please call the Suicide and Crisis Lifeline: 988 call the [place]  National Suicide Prevention Lifeline: 361-386-7546 or TTY: 414-167-3035 TTY (475)346-6665) to talk to a trained counselor call 1-800-273-TALK (toll free, 24 hour hotline) go to [facility] [address], [place] [phone]) call 911 if you are experiencing a Mental Health or Behavioral Health Crisis or need someone to talk to.  Mliss Creed [facility], BSN RN Care Manager/ Transition of Care [place]/ [facility] [phone]

## 2024-03-19 ENCOUNTER — Ambulatory Visit

## 2024-03-19 ENCOUNTER — Encounter: Payer: Self-pay | Admitting: Pharmacist

## 2024-03-19 ENCOUNTER — Other Ambulatory Visit (HOSPITAL_COMMUNITY): Payer: Self-pay

## 2024-03-19 ENCOUNTER — Other Ambulatory Visit: Payer: Self-pay

## 2024-03-19 DIAGNOSIS — G629 Polyneuropathy, unspecified: Secondary | ICD-10-CM

## 2024-03-19 DIAGNOSIS — I214 Non-ST elevation (NSTEMI) myocardial infarction: Secondary | ICD-10-CM

## 2024-03-19 DIAGNOSIS — I480 Paroxysmal atrial fibrillation: Secondary | ICD-10-CM | POA: Diagnosis not present

## 2024-03-19 DIAGNOSIS — E871 Hypo-osmolality and hyponatremia: Secondary | ICD-10-CM | POA: Diagnosis not present

## 2024-03-19 DIAGNOSIS — M1712 Unilateral primary osteoarthritis, left knee: Secondary | ICD-10-CM

## 2024-03-19 DIAGNOSIS — E785 Hyperlipidemia, unspecified: Secondary | ICD-10-CM

## 2024-03-19 DIAGNOSIS — K589 Irritable bowel syndrome without diarrhea: Secondary | ICD-10-CM | POA: Diagnosis not present

## 2024-03-19 DIAGNOSIS — I130 Hypertensive heart and chronic kidney disease with heart failure and stage 1 through stage 4 chronic kidney disease, or unspecified chronic kidney disease: Secondary | ICD-10-CM

## 2024-03-19 DIAGNOSIS — N179 Acute kidney failure, unspecified: Secondary | ICD-10-CM

## 2024-03-19 DIAGNOSIS — N1832 Chronic kidney disease, stage 3b: Secondary | ICD-10-CM | POA: Diagnosis not present

## 2024-03-19 DIAGNOSIS — I5041 Acute combined systolic (congestive) and diastolic (congestive) heart failure: Secondary | ICD-10-CM

## 2024-03-19 DIAGNOSIS — R7989 Other specified abnormal findings of blood chemistry: Secondary | ICD-10-CM | POA: Diagnosis not present

## 2024-03-20 ENCOUNTER — Other Ambulatory Visit: Payer: Self-pay

## 2024-03-24 ENCOUNTER — Other Ambulatory Visit (HOSPITAL_COMMUNITY): Payer: Self-pay

## 2024-03-24 ENCOUNTER — Telehealth: Payer: Self-pay | Admitting: Cardiology

## 2024-03-24 MED ORDER — FUROSEMIDE 40 MG PO TABS
40.0000 mg | ORAL_TABLET | ORAL | 0 refills | Status: DC | PRN
  Filled 2024-03-24: qty 90, 90d supply, fill #0

## 2024-03-24 NOTE — Telephone Encounter (Signed)
*  STAT* If patient is at the pharmacy, call can be transferred to refill team.   1. Which medications need to be refilled? (please list name of each medication and dose if known)    amiodarone  (PACERONE ) 200 MG tablet Take 1 tablet (200 mg total) by mouth 2 (two) times daily for 14 days, THEN 1 tablet (200 mg total) daily.    furosemide  (LASIX ) 40 MG tablet Take 1 tablet (40 mg total) by mouth daily as needed, if gains more than 3 pounds in 1 day or 5 pounds in 1 week      4. Which pharmacy/location (including street and city if local pharmacy) is medication to be sent to?  [facility] [place], [place] - [address] [place] Phone: [phone]  Fax: [phone]       5. Do they need a 30 day or 90 day supply? 90

## 2024-03-27 DIAGNOSIS — I480 Paroxysmal atrial fibrillation: Secondary | ICD-10-CM | POA: Diagnosis not present

## 2024-03-27 DIAGNOSIS — R7989 Other specified abnormal findings of blood chemistry: Secondary | ICD-10-CM | POA: Diagnosis not present

## 2024-03-27 DIAGNOSIS — I130 Hypertensive heart and chronic kidney disease with heart failure and stage 1 through stage 4 chronic kidney disease, or unspecified chronic kidney disease: Secondary | ICD-10-CM | POA: Diagnosis not present

## 2024-03-27 DIAGNOSIS — Z85828 Personal history of other malignant neoplasm of skin: Secondary | ICD-10-CM | POA: Diagnosis not present

## 2024-03-27 DIAGNOSIS — N1832 Chronic kidney disease, stage 3b: Secondary | ICD-10-CM | POA: Diagnosis not present

## 2024-03-27 DIAGNOSIS — M1712 Unilateral primary osteoarthritis, left knee: Secondary | ICD-10-CM | POA: Diagnosis not present

## 2024-03-27 DIAGNOSIS — Z79899 Other long term (current) drug therapy: Secondary | ICD-10-CM | POA: Diagnosis not present

## 2024-03-27 DIAGNOSIS — K589 Irritable bowel syndrome without diarrhea: Secondary | ICD-10-CM | POA: Diagnosis not present

## 2024-03-27 DIAGNOSIS — G629 Polyneuropathy, unspecified: Secondary | ICD-10-CM | POA: Diagnosis not present

## 2024-03-27 DIAGNOSIS — N179 Acute kidney failure, unspecified: Secondary | ICD-10-CM | POA: Diagnosis not present

## 2024-03-27 DIAGNOSIS — I5041 Acute combined systolic (congestive) and diastolic (congestive) heart failure: Secondary | ICD-10-CM | POA: Diagnosis not present

## 2024-03-27 DIAGNOSIS — K219 Gastro-esophageal reflux disease without esophagitis: Secondary | ICD-10-CM | POA: Diagnosis not present

## 2024-03-27 DIAGNOSIS — Z7901 Long term (current) use of anticoagulants: Secondary | ICD-10-CM | POA: Diagnosis not present

## 2024-03-27 DIAGNOSIS — Z602 Problems related to living alone: Secondary | ICD-10-CM | POA: Diagnosis not present

## 2024-03-27 DIAGNOSIS — E871 Hypo-osmolality and hyponatremia: Secondary | ICD-10-CM | POA: Diagnosis not present

## 2024-03-27 DIAGNOSIS — E785 Hyperlipidemia, unspecified: Secondary | ICD-10-CM | POA: Diagnosis not present

## 2024-03-27 DIAGNOSIS — I214 Non-ST elevation (NSTEMI) myocardial infarction: Secondary | ICD-10-CM | POA: Diagnosis not present

## 2024-03-28 ENCOUNTER — Other Ambulatory Visit: Payer: Self-pay | Admitting: *Deleted

## 2024-03-28 ENCOUNTER — Other Ambulatory Visit (HOSPITAL_COMMUNITY): Payer: Self-pay

## 2024-03-28 DIAGNOSIS — I4891 Unspecified atrial fibrillation: Secondary | ICD-10-CM

## 2024-03-28 MED ORDER — APIXABAN 5 MG PO TABS: 5.0000 mg | ORAL_TABLET | Freq: Two times a day (BID) | ORAL | 0 refills | Status: DC

## 2024-03-31 ENCOUNTER — Other Ambulatory Visit: Payer: Self-pay

## 2024-03-31 MED ORDER — FUROSEMIDE 40 MG PO TABS: 40.0000 mg | ORAL_TABLET | ORAL | 2 refills | Status: AC | PRN

## 2024-04-01 ENCOUNTER — Other Ambulatory Visit (HOSPITAL_COMMUNITY): Payer: Self-pay

## 2024-04-02 ENCOUNTER — Other Ambulatory Visit: Payer: Self-pay | Admitting: *Deleted

## 2024-04-02 ENCOUNTER — Other Ambulatory Visit: Payer: Self-pay | Admitting: Cardiology

## 2024-04-02 NOTE — Patient Outreach (Signed)
 Transition of Care week 4  Visit Note  04/02/2024  Name: Kimberly Suarez MRN: [id number]          DOB: 1941-03-21  Situation: Patient enrolled in [facility] 30-day program. Visit completed with patient by telephone.   Background:     Past Medical History:  Diagnosis Date   Allergy    Arthritis    ASCUS (atypical squamous cells of undetermined significance) on Pap smear 2011x2   Negative high-risk HPV, normal Pap smears 2012/2013   Atrial fibrillation (HCC)    Atypical nevus 05/02/2007   right back-moderate    Cancer (HCC)    Cataract    Diverticulosis    GERD (gastroesophageal reflux disease)    Hiatal hernia    Hyperlipemia    Hypertension    IBS (irritable bowel syndrome)    Kidney disease    Lymphocytic colitis 2007   Neuropathy    SCC (squamous cell carcinoma) 01/19/2006   bridge of nose (CX35FU)   SCC (squamous cell carcinoma) 03/08/2017   right inner lower shin (CX35FU)   SCC (squamous cell carcinoma) x 2 02/24/2002   right bridge of nose (Cx35FU),right bridge of nose   Uterine prolapse    Pessary    Assessment: Patient Reported Symptoms: Cognitive Cognitive Status: No symptoms reported, Able to follow simple commands, Alert and oriented to person, place, and time, Normal speech and language skills      Neurological Neurological Review of Symptoms: No symptoms reported Neurological Management Strategies: Medication therapy Neurological Self-Management Outcome: 4 (good) Neurological Comment: pt states she is no numbness today, reports gabapentin  helps with this  HEENT HEENT Symptoms Reported: No symptoms reported      Cardiovascular Cardiovascular Symptoms Reported: No symptoms reported Does patient have uncontrolled Hypertension?: No Cardiovascular Management Strategies: Adequate rest, Medication therapy Cardiovascular Self-Management Outcome: 4 (good) Cardiovascular Comment: Reviewed HF action plan  Respiratory Respiratory Symptoms Reported: No symptoms  reported    Endocrine Endocrine Symptoms Reported: No symptoms reported Is patient diabetic?: No    Gastrointestinal Gastrointestinal Symptoms Reported: No symptoms reported      Genitourinary Genitourinary Symptoms Reported: No symptoms reported    Integumentary Integumentary Symptoms Reported: No symptoms reported    Musculoskeletal Musculoskelatal Symptoms Reviewed: Unsteady gait Musculoskeletal Management Strategies: Medical device Musculoskeletal Self-Management Outcome: 4 (good) Musculoskeletal Comment: continues to work with home health PT, using walker as needed      Psychosocial Psychosocial Symptoms Reported: No symptoms reported         There were no vitals filed for this visit.  Medications Reviewed Today     Reviewed by Aura Mliss LABOR, RN (Registered Nurse) on 04/02/24 at 1035  Med List Status: <None>   Medication Order Taking? Sig Documenting Provider Last Dose Status Informant  acetaminophen  (TYLENOL ) 500 MG tablet [id number]  Take 2 tablets (1,000 mg total) by mouth 3 (three) times daily. Zollie Lowers, MD  Active Self, Pharmacy Records  Alpha-Lipoic Acid 600 MG CAPS [id number]  Take by mouth. [provider]  Active Self, Pharmacy Records  amiodarone  (PACERONE ) 200 MG tablet [id number]  Take 1 tablet (200 mg total) by mouth 2 (two) times daily for 14 days, THEN 1 tablet (200 mg total) daily. Krishnan, Gokul, MD  Active   apixaban  (ELIQUIS ) 5 MG TABS tablet [id number]  Take 1 tablet (5 mg total) by mouth 2 (two) times daily for afib Jolinda Potter M, DO  Active   atorvastatin  (LIPITOR) 40 MG tablet [id number]  TAKE 1 TABLET BY MOUTH DAILY  Jolinda Norene HERO, DO  Active Self, Pharmacy Records  fluticasone  (FLONASE ) 50 MCG/ACT nasal spray [id number]  Place 1 spray into both nostrils 2 (two) times daily as needed for allergies or rhinitis. Lavell Bari LABOR, FNP  Active Self, Pharmacy Records  furosemide  (LASIX ) 40 MG tablet [id number]  Take 1 tablet (40 mg  total) by mouth daily as needed, if gains more than 3 pounds in 1 day or 5 pounds in 1 week Lavona Agent, MD  Active   gabapentin  (NEURONTIN ) 100 MG capsule [id number]  TAKE 3 CAPSULES BY MOUTH AT  BEDTIME Jolinda Norene HERO, DO  Active Self, Pharmacy Records  Glucosamine-Chondroitin (GLUCOSAMINE CHONDR COMPLEX PO) [id number]  Take 1 tablet by mouth 2 (two) times daily. [provider]  Active Self, Pharmacy Records           Med Note JUSTINO, [facility]   Thu Jun 01, 2016 10:14 AM)    Melatonin 10 MG CAPS [id number]  Take by mouth at bedtime and may repeat dose one time if needed. [provider]  Active Self, Pharmacy Records  Multiple Vitamins-Minerals (MULTIVITAMIN ADULT PO) [id number]  Take 1 tablet by mouth daily. [provider]  Active Self, Pharmacy Records  Omega-3 Fatty Acids (FISH OIL) 1000 MG CAPS [id number]  Take 1 capsule by mouth daily. [provider]  Active Self, Pharmacy Records           Med Note JUSTINO, [facility]   Thu Jun 01, 2016 10:15 AM)    saccharomyces boulardii (FLORASTOR) 250 MG capsule [id number]  Take 250 mg by mouth 2 (two) times daily. [provider]  Active Self, Pharmacy Records  TURMERIC CURCUMIN PO [id number]  Take 1,500 mg by mouth 2 (two) times daily. [provider]  Active Self, Pharmacy Records  vitamin B-12 (CYANOCOBALAMIN) 500 MCG tablet [id number]  Take 500 mcg by mouth daily. [provider]  Active Self, Pharmacy Records  VITAMIN D  PO [id number]  Take by mouth. [provider]  Active Self, Pharmacy Records            Recommendation:   PCP Follow-up  Follow Up Plan:   Closing From:  Transitions of Care Program  Mliss Creed [facility], BSN RN Care Manager/ Transition of Care [place]/ [facility] [phone]

## 2024-04-03 DIAGNOSIS — I480 Paroxysmal atrial fibrillation: Secondary | ICD-10-CM | POA: Diagnosis not present

## 2024-04-03 DIAGNOSIS — Z602 Problems related to living alone: Secondary | ICD-10-CM | POA: Diagnosis not present

## 2024-04-03 DIAGNOSIS — G629 Polyneuropathy, unspecified: Secondary | ICD-10-CM | POA: Diagnosis not present

## 2024-04-03 DIAGNOSIS — R7989 Other specified abnormal findings of blood chemistry: Secondary | ICD-10-CM | POA: Diagnosis not present

## 2024-04-03 DIAGNOSIS — E871 Hypo-osmolality and hyponatremia: Secondary | ICD-10-CM | POA: Diagnosis not present

## 2024-04-03 DIAGNOSIS — Z79899 Other long term (current) drug therapy: Secondary | ICD-10-CM | POA: Diagnosis not present

## 2024-04-03 DIAGNOSIS — M1712 Unilateral primary osteoarthritis, left knee: Secondary | ICD-10-CM | POA: Diagnosis not present

## 2024-04-03 DIAGNOSIS — K219 Gastro-esophageal reflux disease without esophagitis: Secondary | ICD-10-CM | POA: Diagnosis not present

## 2024-04-03 DIAGNOSIS — I5041 Acute combined systolic (congestive) and diastolic (congestive) heart failure: Secondary | ICD-10-CM | POA: Diagnosis not present

## 2024-04-03 DIAGNOSIS — K589 Irritable bowel syndrome without diarrhea: Secondary | ICD-10-CM | POA: Diagnosis not present

## 2024-04-03 DIAGNOSIS — N1832 Chronic kidney disease, stage 3b: Secondary | ICD-10-CM | POA: Diagnosis not present

## 2024-04-03 DIAGNOSIS — E785 Hyperlipidemia, unspecified: Secondary | ICD-10-CM | POA: Diagnosis not present

## 2024-04-03 DIAGNOSIS — N179 Acute kidney failure, unspecified: Secondary | ICD-10-CM | POA: Diagnosis not present

## 2024-04-03 DIAGNOSIS — Z85828 Personal history of other malignant neoplasm of skin: Secondary | ICD-10-CM | POA: Diagnosis not present

## 2024-04-03 DIAGNOSIS — I214 Non-ST elevation (NSTEMI) myocardial infarction: Secondary | ICD-10-CM | POA: Diagnosis not present

## 2024-04-03 DIAGNOSIS — I130 Hypertensive heart and chronic kidney disease with heart failure and stage 1 through stage 4 chronic kidney disease, or unspecified chronic kidney disease: Secondary | ICD-10-CM | POA: Diagnosis not present

## 2024-04-03 DIAGNOSIS — Z7901 Long term (current) use of anticoagulants: Secondary | ICD-10-CM | POA: Diagnosis not present

## 2024-04-03 NOTE — Telephone Encounter (Signed)
 Pt of Dr. Lavona. RX was prescribed in the hospital. Does Dr. Lavona want to refill? Please advise.

## 2024-04-07 ENCOUNTER — Encounter (INDEPENDENT_AMBULATORY_CARE_PROVIDER_SITE_OTHER): Admitting: Family Medicine

## 2024-04-07 DIAGNOSIS — R0989 Other specified symptoms and signs involving the circulatory and respiratory systems: Secondary | ICD-10-CM

## 2024-04-07 DIAGNOSIS — I480 Paroxysmal atrial fibrillation: Secondary | ICD-10-CM

## 2024-04-07 DIAGNOSIS — I5041 Acute combined systolic (congestive) and diastolic (congestive) heart failure: Secondary | ICD-10-CM

## 2024-04-07 DIAGNOSIS — I214 Non-ST elevation (NSTEMI) myocardial infarction: Secondary | ICD-10-CM

## 2024-04-07 DIAGNOSIS — N1831 Chronic kidney disease, stage 3a: Secondary | ICD-10-CM

## 2024-04-07 MED ORDER — AMIODARONE HCL 200 MG PO TABS: 200.0000 mg | ORAL_TABLET | Freq: Every day | ORAL | 3 refills | Status: AC

## 2024-04-07 NOTE — Telephone Encounter (Signed)
 Appointment scheduled.

## 2024-04-07 NOTE — Telephone Encounter (Signed)

## 2024-04-09 DIAGNOSIS — E785 Hyperlipidemia, unspecified: Secondary | ICD-10-CM | POA: Diagnosis not present

## 2024-04-09 DIAGNOSIS — I5041 Acute combined systolic (congestive) and diastolic (congestive) heart failure: Secondary | ICD-10-CM | POA: Diagnosis not present

## 2024-04-09 DIAGNOSIS — I130 Hypertensive heart and chronic kidney disease with heart failure and stage 1 through stage 4 chronic kidney disease, or unspecified chronic kidney disease: Secondary | ICD-10-CM | POA: Diagnosis not present

## 2024-04-09 DIAGNOSIS — N1832 Chronic kidney disease, stage 3b: Secondary | ICD-10-CM | POA: Diagnosis not present

## 2024-04-09 DIAGNOSIS — I480 Paroxysmal atrial fibrillation: Secondary | ICD-10-CM | POA: Diagnosis not present

## 2024-04-09 DIAGNOSIS — I214 Non-ST elevation (NSTEMI) myocardial infarction: Secondary | ICD-10-CM | POA: Diagnosis not present

## 2024-04-09 DIAGNOSIS — E871 Hypo-osmolality and hyponatremia: Secondary | ICD-10-CM | POA: Diagnosis not present

## 2024-04-09 DIAGNOSIS — Z85828 Personal history of other malignant neoplasm of skin: Secondary | ICD-10-CM | POA: Diagnosis not present

## 2024-04-09 DIAGNOSIS — Z7901 Long term (current) use of anticoagulants: Secondary | ICD-10-CM | POA: Diagnosis not present

## 2024-04-09 DIAGNOSIS — N179 Acute kidney failure, unspecified: Secondary | ICD-10-CM | POA: Diagnosis not present

## 2024-04-09 DIAGNOSIS — M1712 Unilateral primary osteoarthritis, left knee: Secondary | ICD-10-CM | POA: Diagnosis not present

## 2024-04-09 DIAGNOSIS — R7989 Other specified abnormal findings of blood chemistry: Secondary | ICD-10-CM | POA: Diagnosis not present

## 2024-04-09 DIAGNOSIS — Z602 Problems related to living alone: Secondary | ICD-10-CM | POA: Diagnosis not present

## 2024-04-09 DIAGNOSIS — K219 Gastro-esophageal reflux disease without esophagitis: Secondary | ICD-10-CM | POA: Diagnosis not present

## 2024-04-09 DIAGNOSIS — K589 Irritable bowel syndrome without diarrhea: Secondary | ICD-10-CM | POA: Diagnosis not present

## 2024-04-09 DIAGNOSIS — Z79899 Other long term (current) drug therapy: Secondary | ICD-10-CM | POA: Diagnosis not present

## 2024-04-09 DIAGNOSIS — G629 Polyneuropathy, unspecified: Secondary | ICD-10-CM | POA: Diagnosis not present

## 2024-04-10 ENCOUNTER — Ambulatory Visit: Admitting: *Deleted

## 2024-04-10 VITALS — BP 142/72 | HR 90 | Temp 97.6°F | Ht 63.0 in | Wt 162.4 lb

## 2024-04-10 DIAGNOSIS — R0989 Other specified symptoms and signs involving the circulatory and respiratory systems: Secondary | ICD-10-CM

## 2024-04-10 NOTE — Progress Notes (Signed)
 Pt came in today for manual BP reading which was 142/72 in left arm. She then used her BP machine which gave a reading of 138/86 in the left arm. Pt states she has a follow up with you on 8/26 to further discuss.

## 2024-04-17 ENCOUNTER — Encounter (HOSPITAL_BASED_OUTPATIENT_CLINIC_OR_DEPARTMENT_OTHER): Payer: Self-pay | Admitting: Family

## 2024-04-17 ENCOUNTER — Ambulatory Visit (HOSPITAL_BASED_OUTPATIENT_CLINIC_OR_DEPARTMENT_OTHER): Admitting: Family

## 2024-04-17 VITALS — BP 142/70 | HR 69 | Ht 64.0 in | Wt 140.0 lb

## 2024-04-17 DIAGNOSIS — I10 Essential (primary) hypertension: Secondary | ICD-10-CM

## 2024-04-17 DIAGNOSIS — I480 Paroxysmal atrial fibrillation: Secondary | ICD-10-CM

## 2024-04-17 DIAGNOSIS — D6859 Other primary thrombophilia: Secondary | ICD-10-CM | POA: Diagnosis not present

## 2024-04-17 DIAGNOSIS — I5020 Unspecified systolic (congestive) heart failure: Secondary | ICD-10-CM

## 2024-04-17 MED ORDER — HYDRALAZINE HCL 10 MG PO TABS: ORAL_TABLET | ORAL | 1 refills | Status: DC

## 2024-04-17 NOTE — Patient Instructions (Signed)
 Medication Instructions:   START Hydralazine  one (1) tablet by mouth ( 10 mg) daily as needed for BP 180 or above.    *If you need a refill on your cardiac medications before your next appointment, please call your pharmacy*  Lab Work:  TODAY!!!!!! CMET/TSH/CATECHOLAMINES/METANEPHRINES/RENAL ALDOSTERONE/DIRECT LDL  If you have labs (blood work) drawn today and your tests are completely normal, you will receive your results only by: MyChart Message (if you have MyChart) OR A paper copy in the mail If you have any lab test that is abnormal or we need to change your treatment, we will call you to review the results.  Testing/Procedures:   None ordered.   Follow-Up: At [facility], you and your health needs are our priority.  As part of our continuing mission to provide you with exceptional heart care, our providers are all part of one team.  This team includes your primary Cardiologist (physician) and Advanced Practice Providers or APPs (Physician Assistants and Nurse Practitioners) who all work together to provide you with the care you need, when you need it.  Your next appointment:   2 month(s)  Provider:   Lynwood Schilling, MD than Annabella Scarce, MD    We recommend signing up for the patient portal called MyChart.  Sign up information is provided on this After Visit Summary.  MyChart is used to connect with patients for Virtual Visits (Telemedicine).  Patients are able to view lab/test results, encounter notes, upcoming appointments, etc.  Non-urgent messages can be sent to your provider as well.   To learn more about what you can do with MyChart, go to [email or web address].

## 2024-04-18 ENCOUNTER — Ambulatory Visit (HOSPITAL_BASED_OUTPATIENT_CLINIC_OR_DEPARTMENT_OTHER): Payer: Self-pay | Admitting: Family

## 2024-04-18 LAB — LDL CHOLESTEROL, DIRECT: LDL Direct: 75 mg/dL (ref 0–99)

## 2024-04-22 ENCOUNTER — Ambulatory Visit (INDEPENDENT_AMBULATORY_CARE_PROVIDER_SITE_OTHER)

## 2024-04-22 ENCOUNTER — Encounter: Payer: Self-pay | Admitting: Family Medicine

## 2024-04-22 ENCOUNTER — Ambulatory Visit: Admitting: Family Medicine

## 2024-04-22 VITALS — BP 150/77 | HR 71 | Temp 97.1°F | Ht 64.0 in | Wt 145.2 lb

## 2024-04-22 DIAGNOSIS — W19XXXA Unspecified fall, initial encounter: Secondary | ICD-10-CM

## 2024-04-22 DIAGNOSIS — M170 Bilateral primary osteoarthritis of knee: Secondary | ICD-10-CM | POA: Diagnosis not present

## 2024-04-22 DIAGNOSIS — N3000 Acute cystitis without hematuria: Secondary | ICD-10-CM

## 2024-04-22 DIAGNOSIS — N1831 Chronic kidney disease, stage 3a: Secondary | ICD-10-CM | POA: Diagnosis not present

## 2024-04-22 DIAGNOSIS — M25461 Effusion, right knee: Secondary | ICD-10-CM | POA: Diagnosis not present

## 2024-04-22 DIAGNOSIS — I480 Paroxysmal atrial fibrillation: Secondary | ICD-10-CM | POA: Diagnosis not present

## 2024-04-22 DIAGNOSIS — M1711 Unilateral primary osteoarthritis, right knee: Secondary | ICD-10-CM | POA: Diagnosis not present

## 2024-04-22 DIAGNOSIS — R82998 Other abnormal findings in urine: Secondary | ICD-10-CM | POA: Diagnosis not present

## 2024-04-22 DIAGNOSIS — M25561 Pain in right knee: Secondary | ICD-10-CM | POA: Diagnosis not present

## 2024-04-22 DIAGNOSIS — R300 Dysuria: Secondary | ICD-10-CM | POA: Diagnosis not present

## 2024-04-22 DIAGNOSIS — R0989 Other specified symptoms and signs involving the circulatory and respiratory systems: Secondary | ICD-10-CM | POA: Diagnosis not present

## 2024-04-22 DIAGNOSIS — I6522 Occlusion and stenosis of left carotid artery: Secondary | ICD-10-CM | POA: Insufficient documentation

## 2024-04-22 LAB — MICROSCOPIC EXAMINATION

## 2024-04-22 LAB — URINALYSIS, ROUTINE W REFLEX MICROSCOPIC
Bilirubin, UA: NEGATIVE
Glucose, UA: NEGATIVE
Ketones, UA: NEGATIVE
Nitrite, UA: NEGATIVE
Protein,UA: NEGATIVE
Specific Gravity, UA: 1.015 (ref 1.005–1.030)
Urobilinogen, Ur: 0.2 mg/dL (ref 0.2–1.0)
pH, UA: 6 (ref 5.0–7.5)

## 2024-04-22 MED ORDER — FLUCONAZOLE 150 MG PO TABS: 150.0000 mg | ORAL_TABLET | Freq: Once | ORAL | 0 refills | Status: AC

## 2024-04-22 MED ORDER — CEPHALEXIN 500 MG PO CAPS: 500.0000 mg | ORAL_CAPSULE | Freq: Two times a day (BID) | ORAL | 0 refills | Status: AC

## 2024-04-22 NOTE — Progress Notes (Signed)
 Cardiology Office Note:   Date:  04/23/2024  ID:  Suarez, Kimberly 30-Oct-1940, MRN [id number] PCP: Jolinda Norene HERO, DO  [facility] Providers Cardiologist:  Lynwood Schilling, MD {  History of Present Illness:   Kimberly Suarez is a 83 y.o. female who saw Dr. Sheena for PAF.  She has CKD 3.  She has HTN.  She wore a monitor in 2024 and had PAF 2%.  She had symptoms associated with the atrial fib.   Echo in 2024 demonstrated a NL EF.  In July she had cardiac cath to evaluate acute SOB with a reduced EF in June 2025.   Her EF on echo  was 30 - 35%.     Right heart pressures were normal.  Coronary angiography demonstrated no significant stenosis.    She returns for follow up.   She has been seen for work up of HTN.  Metanephrines, catecholamines and aldosterone were ordered.  The etiology of her cardiomyopathy was not entirely clear.  She was sent home on amiodarone  because it was thought possibly to be atrial fibrillation.  She had paroxysmal atrial fibrillation with rapid rate in the hospital.  Since she got home she has had none of the acute shortness of breath that prompted her hospitalization.  She has been watching her salt.  She has had some lower extremity swelling but she does not take her Lasix  and when she is swelling and she did not take it today.  She is trying to watch her salt a little bit.  She has not been really limiting her fluid.  She is complaining of a little abdominal discomfort.  She denies any PND or orthopnea.  She really does not feel the atrial fibrillation except she might be a little weak occasionally.  She has not had any presyncope or syncope.  She is not having any chest discomfort.  ROS: As stated in the HPI and negative for all other systems.  Studies Reviewed:    EKG:     NA  Risk Assessment/Calculations:    CHA2DS2-VASc Score = 5   This indicates a 7.2% annual risk of stroke. The patient's score is based upon: CHF History: 0 HTN History:  1 Diabetes History: 0 Stroke History: 0 Vascular Disease History: 1 Age Score: 2 Gender Score: 1   Physical Exam:   VS:  BP 130/70   Pulse 64   Ht 5' 4 (1.626 m)   Wt 146 lb (66.2 kg)   BMI 25.06 kg/m    Wt Readings from Last 3 Encounters:  04/23/24 146 lb (66.2 kg)  04/22/24 145 lb 4 oz (65.9 kg)  04/17/24 140 lb (63.5 kg)     GEN: Well nourished, well developed in no acute distress NECK: No JVD; No carotid bruits CARDIAC: RRR, no murmurs, rubs, gallops RESPIRATORY:  Clear to auscultation without rales, wheezing or rhonchi  ABDOMEN: Soft, non-tender, non-distended EXTREMITIES: Mild ankle edema; No deformity   ASSESSMENT AND PLAN:   PAF: She tolerates anticoagulation.  She does not really feel the fibrillation.  I am going to get her to get a Fitbit to monitor her heart rate to see if we can quantify her fibrillation.  As stated above it was not a significant burden previously.  She remains on the amiodarone  and will continue this and I will follow with appropriate labs in the future.    Anticoagulation: She tolerates anticoagulation.  No change in therapy.   Carotid stenosis:  The left  carotid had 40 - 59% left sided plaque in May 2025.  She will have follow up in 2026.    HTN: Her blood pressure is actually controlled on the meds as listed.  She has as needed hydralazine  if she has any spiking.  Cardiomyopathy:   Etiology is not clear but hemodynamics are okay.  We are somewhat limited and that she has had significant renal insufficiency so I am not wanting to start ARNI or ARB.  She has had bradycardia so beta-blockers are more difficult.  I could consider Farxiga in the future.  If her ejection fraction remains low I will titrate hydralazine  nitrates and consider spironolactone.  I am going to follow-up with an echocardiogram in 3 months.       Follow up with me after the echocardiogram in 3 months  Signed, Lynwood Schilling, MD

## 2024-04-22 NOTE — Progress Notes (Signed)
 Subjective: CC: general follow up PCP: Jolinda Norene HERO, DO YEP:Kimberly Suarez is a 83 y.o. female presenting to clinic today for:  Discussed the use of AI scribe software for clinical note transcription with the patient, who gave verbal consent to proceed.  History of Present Illness   Kimberly Suarez is an 83 year old female with hypertension and knee osteoarthritis who presents with increased knee pain and urinary symptoms following a fall.  She fell two weeks ago, resulting in a cut on her elbow and increased pain in her right knee, which is affected by osteoarthritis. The pain has worsened since the fall, especially when getting up from a seated position and walking. She has an appointment with her knee doctor on September 25th and is scheduled to receive a steroid injection. She currently uses Voltaren gel and a Tylenol  rub for joint pain relief.  She has a history of hypertension and recently had her blood pressure medication changed from an ACE inhibitor to hydralazine  due to concerns about kidney function. She monitors her blood pressure twice daily and notes that it has not been as high as before. She received hydralazine  in the mail yesterday and takes it once daily, with the option to increase to twice daily if her blood pressure exceeds 180.  She experiences urinary urgency, having to get up five times during the night, and has noticed 'little white bubbles' in her urine. No burning during urination but these changes have been observed since Sunday night. She recalls a previous episode six months ago when she was advised not to take an antibiotic for a suspected urinary tract infection.  She is on amiodarone  for atrial fibrillation and monitors her thyroid  function due to this medication. She has a history of heart failure and is vigilant about monitoring her weight and salt intake to prevent fluid retention.      ROS: Per HPI  No Known Allergies Past Medical History:   Diagnosis Date   Allergy    Arthritis    ASCUS (atypical squamous cells of undetermined significance) on Pap smear 2011x2   Negative high-risk HPV, normal Pap smears 2012/2013   Atrial fibrillation (HCC)    Atypical nevus 05/02/2007   right back-moderate    Cancer (HCC)    Cataract    Diverticulosis    GERD (gastroesophageal reflux disease)    Hiatal hernia    Hyperlipemia    Hypertension    IBS (irritable bowel syndrome)    Kidney disease    Lymphocytic colitis 2007   Neuropathy    SCC (squamous cell carcinoma) 01/19/2006   bridge of nose (CX35FU)   SCC (squamous cell carcinoma) 03/08/2017   right inner lower shin (CX35FU)   SCC (squamous cell carcinoma) x 2 02/24/2002   right bridge of nose (Cx35FU),right bridge of nose   Uterine prolapse    Pessary    Current Outpatient Medications:    acetaminophen  (TYLENOL ) 500 MG tablet, Take 2 tablets (1,000 mg total) by mouth 3 (three) times daily., Disp: 180 tablet, Rfl: PRN   Alpha-Lipoic Acid 600 MG CAPS, Take by mouth., Disp: , Rfl:    amiodarone  (PACERONE ) 200 MG tablet, Take 1 tablet (200 mg total) by mouth daily., Disp: 90 tablet, Rfl: 3   apixaban  (ELIQUIS ) 5 MG TABS tablet, Take 1 tablet (5 mg total) by mouth 2 (two) times daily for afib, Disp: 180 tablet, Rfl: 0   atorvastatin  (LIPITOR) 40 MG tablet, TAKE 1 TABLET BY MOUTH DAILY, Disp:  100 tablet, Rfl: 1   cephALEXin  (KEFLEX ) 500 MG capsule, Take 1 capsule (500 mg total) by mouth 2 (two) times daily for 7 days. For UTI, Disp: 14 capsule, Rfl: 0   fluconazole  (DIFLUCAN ) 150 MG tablet, Take 1 tablet (150 mg total) by mouth once for 1 dose. ONLY if needed for yeast infection, Disp: 1 tablet, Rfl: 0   fluticasone  (FLONASE ) 50 MCG/ACT nasal spray, Place 1 spray into both nostrils 2 (two) times daily as needed for allergies or rhinitis., Disp: 16 g, Rfl: 6   furosemide  (LASIX ) 40 MG tablet, Take 1 tablet (40 mg total) by mouth daily as needed, if gains more than 3 pounds in 1 day  or 5 pounds in 1 week, Disp: 90 tablet, Rfl: 2   gabapentin  (NEURONTIN ) 100 MG capsule, TAKE 3 CAPSULES BY MOUTH AT  BEDTIME, Disp: 300 capsule, Rfl: 1   Glucosamine-Chondroitin (GLUCOSAMINE CHONDR COMPLEX PO), Take 1 tablet by mouth 2 (two) times daily., Disp: , Rfl:    hydrALAZINE  (APRESOLINE ) 10 MG tablet, Take one (1) tablet ( 10 mg) daily as needed for BP greater than 180., Disp: 30 tablet, Rfl: 1   Melatonin 10 MG CAPS, Take by mouth at bedtime and may repeat dose one time if needed., Disp: , Rfl:    Multiple Vitamins-Minerals (MULTIVITAMIN ADULT PO), Take 1 tablet by mouth daily., Disp: , Rfl:    Omega-3 Fatty Acids (FISH OIL) 1000 MG CAPS, Take 1 capsule by mouth daily., Disp: , Rfl:    saccharomyces boulardii (FLORASTOR) 250 MG capsule, Take 250 mg by mouth 2 (two) times daily., Disp: , Rfl:    TURMERIC CURCUMIN PO, Take 1,500 mg by mouth 2 (two) times daily., Disp: , Rfl:    vitamin B-12 (CYANOCOBALAMIN) 500 MCG tablet, Take 500 mcg by mouth daily., Disp: , Rfl:    VITAMIN D  PO, Take by mouth., Disp: , Rfl:  Social History   Socioeconomic History   Marital status: Widowed    Spouse name: Not on file   Number of children: 3   Years of education: Not on file   Highest education level: 8th grade  Occupational History   Occupation: Retired   Tobacco Use   Smoking status: Never   Smokeless tobacco: Never  Vaping Use   Vaping status: Never Used  Substance and Sexual Activity   Alcohol use: Not Currently   Drug use: No   Sexual activity: Not Currently    Birth control/protection: Post-menopausal    Comment: 1st intercourse 15 yo--1 partner  Other Topics Concern   Not on file  Social History Narrative   Lives at home with husband.   Right-handed.   Caffeine use: one cup caffeine some days.   Social Drivers of Corporate investment banker Strain: Low Risk  (02/26/2024)   Overall Financial Resource Strain (CARDIA)    Difficulty of Paying Living Expenses: Not very hard  Food  Insecurity: No Food Insecurity (03/03/2024)   Hunger Vital Sign    Worried About Running Out of Food in the Last Year: Never true    Ran Out of Food in the Last Year: Never true  Transportation Needs: No Transportation Needs (03/03/2024)   PRAPARE - Administrator, Civil Service (Medical): No    Lack of Transportation (Non-Medical): No  Physical Activity: Insufficiently Active (10/09/2018)   Exercise Vital Sign    Days of Exercise per Week: 2 days    Minutes of Exercise per Session: 30 min  Stress: No Stress Concern Present (10/09/2018)   [employer] of Occupational Health - Occupational Stress Questionnaire    Feeling of Stress : Not at all  Social Connections: Moderately Isolated (02/24/2024)   Social Connection and Isolation Panel    Frequency of Communication with Friends and Family: More than three times a week    Frequency of Social Gatherings with Friends and Family: More than three times a week    Attends Religious Services: More than 4 times per year    Active Member of [employer] or Organizations: No    Attends Banker Meetings: Never    Marital Status: Widowed  Intimate Partner Violence: Not At Risk (03/03/2024)   Humiliation, Afraid, Rape, and Kick questionnaire    Fear of Current or Ex-Partner: No    Emotionally Abused: No    Physically Abused: No    Sexually Abused: No   Family History  Problem Relation Age of Onset   Heart disease Mother    COPD Father    Asthma Father    Alzheimer's disease Sister    Multiple sclerosis Sister    Heart disease Sister    Diabetes Brother        BKA   Alzheimer's disease Brother    Diabetes Brother    Early death Brother        died in war   Stomach cancer Paternal Aunt    Colon cancer Neg Hx    Kidney disease Neg Hx    Liver disease Neg Hx    Esophageal cancer Neg Hx    Rectal cancer Neg Hx     Objective: Office vital signs reviewed. BP (!) 144/72   Pulse 71   Temp (!) 97.1 F (36.2 C)   Ht 5' 4  (1.626 m)   Wt 145 lb 4 oz (65.9 kg)   SpO2 98%   BMI 24.93 kg/m   Physical Examination:  General: Awake, alert, well nourished, No acute distress HEENT: sclera white, MMM Cardio: bradycardic with regular rhythm, S1S2 heard, no murmurs appreciated Pulm: clear to auscultation bilaterally, no wheezes, rhonchi or rales; normal work of breathing on room air GU: no suprapubic TTP Extremities: warm, well perfused, No edema, cyanosis or clubbing; +2 pulses bilaterally MSK: stiff, independent gait. Pain in right knee with thessaly maneuver. No tenderness palpation to the joint, joint line, posterior popliteal fossa, quads tendon.  No gross effusion, erythema.  Assessment/ Plan: 83 y.o. female   Acute cystitis without hematuria - Plan: Urinalysis, Routine w reflex microscopic, cephALEXin  (KEFLEX ) 500 MG capsule, fluconazole  (DIFLUCAN ) 150 MG tablet  Paroxysmal atrial fibrillation (HCC) - Plan: TSH  Foamy urine - Plan: Basic Metabolic Panel  Primary osteoarthritis of right knee - Plan: DG Knee 1-2 Views Right  Fall, initial encounter - Plan: DG Knee 1-2 Views Right  Stage 3a chronic kidney disease (HCC)  Labile blood pressure  Assessment and Plan    Right knee osteoarthritis with pain, post-fall Increased pain post-fall suggests possible ligament strain or meniscus involvement. Advanced osteoarthritis with bone-on-bone contact noted. - Order x-ray of the right knee. - Instruct to apply Voltaren gel three to four times daily.  Urinary tract infection Urinalysis confirmed infection with bacteria and white blood cells. White bubbles in urine may indicate proteinuria or infection. - Send urine specimen for analysis. - Prescribe antibiotic. - Consider antifungal if yeast infection symptoms develop. - Reorder kidney function tests.  Hypertension w/ [id number] Switched to hydralazine  due to kidney concerns. To be used  if blood pressure exceeds 180 mmHg. Currently at goal for age - Instruct  to monitor blood pressure daily. - Use hydralazine  as needed if blood pressure exceeds 180 mmHg. - Recent dc of ACE-I. Proteinuria is a concern. - Reorder kidney function tests.   Atrial fibrillation Advised to monitor weight and salt intake to prevent fluid retention and pulmonary congestion. - Instruct to monitor weight and salt intake. - No current arrhythmia, indicating effective amiodarone  therapy. - Continue amiodarone  therapy. - Order thyroid  function tests.   Norene CHRISTELLA Fielding, DO [facility] [phone]

## 2024-04-23 ENCOUNTER — Ambulatory Visit (INDEPENDENT_AMBULATORY_CARE_PROVIDER_SITE_OTHER): Admitting: Cardiology

## 2024-04-23 ENCOUNTER — Ambulatory Visit: Payer: Self-pay | Admitting: Family Medicine

## 2024-04-23 ENCOUNTER — Encounter: Payer: Self-pay | Admitting: Cardiology

## 2024-04-23 VITALS — BP 130/70 | HR 64 | Ht 64.0 in | Wt 146.0 lb

## 2024-04-23 DIAGNOSIS — I6522 Occlusion and stenosis of left carotid artery: Secondary | ICD-10-CM | POA: Diagnosis not present

## 2024-04-23 DIAGNOSIS — I429 Cardiomyopathy, unspecified: Secondary | ICD-10-CM | POA: Diagnosis not present

## 2024-04-23 DIAGNOSIS — I480 Paroxysmal atrial fibrillation: Secondary | ICD-10-CM

## 2024-04-23 LAB — BASIC METABOLIC PANEL WITH GFR
BUN/Creatinine Ratio: 23 (ref 12–28)
BUN: 35 mg/dL — ABNORMAL HIGH (ref 8–27)
CO2: 25 mmol/L (ref 20–29)
Calcium: 9.4 mg/dL (ref 8.7–10.3)
Chloride: 95 mmol/L — ABNORMAL LOW (ref 96–106)
Creatinine, Ser: 1.55 mg/dL — ABNORMAL HIGH (ref 0.57–1.00)
Glucose: 138 mg/dL — ABNORMAL HIGH (ref 70–99)
Potassium: 4.2 mmol/L (ref 3.5–5.2)
Sodium: 135 mmol/L (ref 134–144)
eGFR: 33 mL/min/{1.73_m2} — ABNORMAL LOW

## 2024-04-23 LAB — TSH: TSH: 1.37 u[IU]/mL (ref 0.450–4.500)

## 2024-04-23 NOTE — Patient Instructions (Addendum)
 Medication Instructions:   Continue all current medications.   Labwork:  none  Testing/Procedures:  Your physician has requested that you have an echocardiogram. Echocardiography is a painless test that uses sound waves to create images of your heart. It provides your doctor with information about the size and shape of your heart and how well your heart's chambers and valves are working. This procedure takes approximately one hour. There are no restrictions for this procedure. Please do NOT wear cologne, perfume, aftershave, or lotions (deodorant is allowed). Please arrive 15 minutes prior to your appointment time.  Please note: We ask at that you not bring children with you during ultrasound (echo/ vascular) testing. Due to room size and safety concerns, children are not allowed in the ultrasound rooms during exams. Our front office staff cannot provide observation of children in our lobby area while testing is being conducted. An adult accompanying a patient to their appointment will only be allowed in the ultrasound room at the discretion of the ultrasound technician under special circumstances. We apologize for any inconvenience. DUE IN 3 MONTHS   Follow-Up:  Follow up after echo   Any Other Special Instructions Will Be Listed Below (If Applicable).  Fit Bit   If you need a refill on your cardiac medications before your next appointment, please call your pharmacy.

## 2024-04-24 ENCOUNTER — Ambulatory Visit: Admitting: Cardiology

## 2024-04-24 LAB — COMPREHENSIVE METABOLIC PANEL WITH GFR
ALT: 19 [IU]/L (ref 0–32)
AST: 23 [IU]/L (ref 0–40)
Albumin: 4.1 g/dL (ref 3.7–4.7)
Alkaline Phosphatase: 48 [IU]/L (ref 44–121)
BUN/Creatinine Ratio: 16 (ref 12–28)
BUN: 22 mg/dL (ref 8–27)
Bilirubin Total: 0.2 mg/dL (ref 0.0–1.2)
CO2: 27 mmol/L (ref 20–29)
Calcium: 9.4 mg/dL (ref 8.7–10.3)
Chloride: 96 mmol/L (ref 96–106)
Creatinine, Ser: 1.34 mg/dL — ABNORMAL HIGH (ref 0.57–1.00)
Globulin, Total: 2.6 g/dL (ref 1.5–4.5)
Glucose: 122 mg/dL — ABNORMAL HIGH (ref 70–99)
Potassium: 4.2 mmol/L (ref 3.5–5.2)
Sodium: 135 mmol/L (ref 134–144)
Total Protein: 6.7 g/dL (ref 6.0–8.5)
eGFR: 39 mL/min/{1.73_m2} — ABNORMAL LOW

## 2024-04-24 LAB — ALDOSTERONE + RENIN ACTIVITY W/ RATIO
Aldos/Renin Ratio: 5.1 (ref 0.0–30.0)
Aldosterone: 4 ng/dL (ref 0.0–30.0)
Renin Activity, Plasma: 0.787 ng/mL/h (ref 0.167–5.380)

## 2024-04-24 LAB — TSH: TSH: 1.36 u[IU]/mL (ref 0.450–4.500)

## 2024-04-24 LAB — METANEPHRINES, PLASMA
Metanephrine, Free: <25 pg/mL (ref 0.0–88.0)
Normetanephrine, Free: 69.4 pg/mL (ref 0.0–297.2)

## 2024-04-24 LAB — CATECHOLAMINES, FRACTIONATED, PLASMA
Dopamine: 15.6 pg/mL (ref 0.0–36.7)
Epinephrine: 11.1 pg/mL (ref 0.0–55.4)
Norepinephrine: 755 pg/mL — ABNORMAL HIGH (ref 115–524)

## 2024-04-25 ENCOUNTER — Ambulatory Visit: Payer: Medicare Other | Admitting: Family Medicine

## 2024-04-29 ENCOUNTER — Other Ambulatory Visit

## 2024-04-29 ENCOUNTER — Telehealth: Payer: Self-pay

## 2024-04-29 DIAGNOSIS — N289 Disorder of kidney and ureter, unspecified: Secondary | ICD-10-CM | POA: Diagnosis not present

## 2024-04-29 NOTE — Telephone Encounter (Signed)
 Patient is in office today asking if she should take Cosamin for her bones, she is concerned about the amount of sodium in it. The package says serving size 3 daily and theres 70mg  of sodium per serving. Patient would like to know your opinion on this.

## 2024-04-30 ENCOUNTER — Ambulatory Visit: Payer: Self-pay | Admitting: Family Medicine

## 2024-04-30 LAB — BASIC METABOLIC PANEL WITH GFR
BUN/Creatinine Ratio: 24 (ref 12–28)
BUN: 32 mg/dL — ABNORMAL HIGH (ref 8–27)
CO2: 25 mmol/L (ref 20–29)
Calcium: 9.5 mg/dL (ref 8.7–10.3)
Chloride: 93 mmol/L — ABNORMAL LOW (ref 96–106)
Creatinine, Ser: 1.36 mg/dL — ABNORMAL HIGH (ref 0.57–1.00)
Glucose: 81 mg/dL (ref 70–99)
Potassium: 4.4 mmol/L (ref 3.5–5.2)
Sodium: 134 mmol/L (ref 134–144)
eGFR: 39 mL/min/{1.73_m2} — ABNORMAL LOW

## 2024-05-20 ENCOUNTER — Other Ambulatory Visit: Payer: Self-pay | Admitting: Family Medicine

## 2024-05-20 DIAGNOSIS — I4891 Unspecified atrial fibrillation: Secondary | ICD-10-CM

## 2024-05-22 DIAGNOSIS — M170 Bilateral primary osteoarthritis of knee: Secondary | ICD-10-CM | POA: Diagnosis not present

## 2024-05-28 ENCOUNTER — Other Ambulatory Visit (HOSPITAL_BASED_OUTPATIENT_CLINIC_OR_DEPARTMENT_OTHER): Payer: Self-pay | Admitting: Family

## 2024-06-04 ENCOUNTER — Telehealth: Payer: Self-pay | Admitting: Cardiology

## 2024-06-04 ENCOUNTER — Ambulatory Visit: Admitting: Cardiology

## 2024-06-04 NOTE — Telephone Encounter (Signed)
 Called pt left a detailed message with MD recommendation ok per DPR.  Advised pt to call the office with questions or concerns.

## 2024-06-04 NOTE — Telephone Encounter (Signed)
 Called pt who reports has a fitbit but is unable to connect to her phone.  Reports granddaughter tried to help but was unable to connect.   I attempted to help pt she reports doesn't know anything about phone or fitbit and doesn't feel can follow instructions.  Pt expresses will see if grandson can help her.  Pt doesn't want to continue waiting would like to see if MD wants her to wear a heart monitor.  Pt reports doesn't feel funny heart beats or palpitations.  Only reports has wt gain some days and takes PRN furosemide  as needed.  Advised pt will send concern to MD to advise.

## 2024-06-04 NOTE — Telephone Encounter (Signed)
 Patient calling to see if the dr wants her to wear a heart monitor again. She states that she is unable to link her fit bit to her phone. Please advise

## 2024-06-12 ENCOUNTER — Telehealth: Payer: Self-pay | Admitting: Cardiology

## 2024-06-12 DIAGNOSIS — I480 Paroxysmal atrial fibrillation: Secondary | ICD-10-CM

## 2024-06-12 NOTE — Telephone Encounter (Signed)
 Looks like the monitor was not ordered, by mistake. Ordered a 2 week Zio today.  Informed her of this information- monitor will be coming in about a week. Apologized for the delay and she was understanding.  She reports that she has been taking lasix  every other day lately because she ends up gaining 3 pounds overnight about every other night. She denies any shortness of breath. Asked her to let us  know if she ends up having to take this daily and/or if she develops shortness of breath. She verbalized understanding.    Lavona Agent, MD to Randy Hamp SAILOR, RN     06/04/24  1:29 PM Yes.  I thought 21 days but OK.  2 weeks Randy Hamp SAILOR, RN to Lavona Agent, MD     06/04/24  1:22 PM Zio only goes up to 2 weeks will this be okay? Lavona Agent, MD to Randy Hamp SAILOR, RN     06/04/24 12:49 PM No .  Lets do a 3 week zio.  thanks Randy Hamp SAILOR, RN to Lavona Agent, MD     06/04/24 11:28 AM I want to make sure I order the correct monitor.  Do you want a live 30 day monitor?   Hamp, RN  Lavona Agent, MD to Randy Hamp SAILOR, RN     06/04/24  9:39 AM We could do a four week monitor for atrial fib

## 2024-06-12 NOTE — Telephone Encounter (Signed)
 Pt states she still has not received heart monitor in the mail per conversation with [facility] 10/8. She would like to know if one has been mailed yet. Please advise.

## 2024-06-13 ENCOUNTER — Ambulatory Visit: Attending: Cardiology

## 2024-06-13 DIAGNOSIS — I480 Paroxysmal atrial fibrillation: Secondary | ICD-10-CM

## 2024-06-13 NOTE — Progress Notes (Unsigned)
 Enrolled patient for a 14 day Zio XT  monitor to be mailed to patients home

## 2024-06-18 ENCOUNTER — Ambulatory Visit

## 2024-06-20 ENCOUNTER — Ambulatory Visit (INDEPENDENT_AMBULATORY_CARE_PROVIDER_SITE_OTHER): Admitting: Family Medicine

## 2024-06-20 ENCOUNTER — Encounter: Payer: Self-pay | Admitting: Family Medicine

## 2024-06-20 VITALS — BP 121/62 | HR 81 | Temp 98.1°F | Ht 64.0 in | Wt 148.0 lb

## 2024-06-20 DIAGNOSIS — J329 Chronic sinusitis, unspecified: Secondary | ICD-10-CM

## 2024-06-20 MED ORDER — DESLORATADINE 5 MG PO TABS: 5.0000 mg | ORAL_TABLET | ORAL | 3 refills | Status: AC | PRN

## 2024-06-20 MED ORDER — BENZONATATE 100 MG PO CAPS: 100.0000 mg | ORAL_CAPSULE | Freq: Three times a day (TID) | ORAL | 0 refills | Status: AC | PRN

## 2024-06-20 NOTE — Progress Notes (Signed)
 Subjective: CC: Rhinosinusitis PCP: Jolinda Kimberly HERO, DO YEP:Gjwpz Kimberly Suarez is a 83 y.o. female presenting to clinic today for:  She reports that she has been having some drainage and mucus production that is causing cough.  Denies any hemoptysis or brown sputum.  No purulence from nares.  No fevers.  No shortness of breath or wheezing.  She was not sure if maybe she had a sinus infection.  Symptoms been ongoing for about a week.  No known sick contacts.  Has not used anything except for her Flonase  because she did not know what was safe with her kidney disease and cardiac disease.  Also asking if she can get some help placing a Zio patch that was mailed to her by her cardiologist recently   ROS: Per HPI  No Known Allergies Past Medical History:  Diagnosis Date   Allergy    Arthritis    ASCUS (atypical squamous cells of undetermined significance) on Pap smear 2011x2   Negative high-risk HPV, normal Pap smears 2012/2013   Atrial fibrillation (HCC)    Atypical nevus 05/02/2007   right back-moderate    Cancer (HCC)    Cataract    Diverticulosis    GERD (gastroesophageal reflux disease)    Hiatal hernia    Hyperlipemia    Hypertension    IBS (irritable bowel syndrome)    Kidney disease    Lymphocytic colitis 2007   Neuropathy    SCC (squamous cell carcinoma) 01/19/2006   bridge of nose (CX35FU)   SCC (squamous cell carcinoma) 03/08/2017   right inner lower shin (CX35FU)   SCC (squamous cell carcinoma) x 2 02/24/2002   right bridge of nose (Cx35FU),right bridge of nose   Uterine prolapse    Pessary    Current Outpatient Medications:    acetaminophen  (TYLENOL ) 500 MG tablet, Take 2 tablets (1,000 mg total) by mouth 3 (three) times daily., Disp: 180 tablet, Rfl: PRN   Alpha-Lipoic Acid 600 MG CAPS, Take by mouth., Disp: , Rfl:    amiodarone  (PACERONE ) 200 MG tablet, Take 1 tablet (200 mg total) by mouth daily., Disp: 90 tablet, Rfl: 3   apixaban  (ELIQUIS ) 5 MG TABS  tablet, TAKE 1 TABLET BY MOUTH TWICE  DAILY FOR AFIB, Disp: 200 tablet, Rfl: 1   atorvastatin  (LIPITOR) 40 MG tablet, TAKE 1 TABLET BY MOUTH DAILY, Disp: 100 tablet, Rfl: 1   fluticasone  (FLONASE ) 50 MCG/ACT nasal spray, Place 1 spray into both nostrils 2 (two) times daily as needed for allergies or rhinitis., Disp: 16 g, Rfl: 6   furosemide  (LASIX ) 40 MG tablet, Take 1 tablet (40 mg total) by mouth daily as needed, if gains more than 3 pounds in 1 day or 5 pounds in 1 week, Disp: 90 tablet, Rfl: 2   gabapentin  (NEURONTIN ) 100 MG capsule, TAKE 3 CAPSULES BY MOUTH AT  BEDTIME, Disp: 300 capsule, Rfl: 1   Glucosamine-Chondroitin (GLUCOSAMINE CHONDR COMPLEX PO), Take 1 tablet by mouth 2 (two) times daily., Disp: , Rfl:    hydrALAZINE  (APRESOLINE ) 10 MG tablet, TAKE 1 TABLET BY MOUTH DAILY AS  NEEDED FOR BP GREATER THAN 180, Disp: 90 tablet, Rfl: 3   Melatonin 10 MG CAPS, Take by mouth at bedtime and may repeat dose one time if needed., Disp: , Rfl:    Multiple Vitamins-Minerals (MULTIVITAMIN ADULT PO), Take 1 tablet by mouth daily., Disp: , Rfl:    Omega-3 Fatty Acids (FISH OIL) 1000 MG CAPS, Take 1 capsule by mouth daily., Disp: ,  Rfl:    saccharomyces boulardii (FLORASTOR) 250 MG capsule, Take 250 mg by mouth 2 (two) times daily., Disp: , Rfl:    TURMERIC CURCUMIN PO, Take 1,500 mg by mouth 2 (two) times daily., Disp: , Rfl:    vitamin B-12 (CYANOCOBALAMIN) 500 MCG tablet, Take 500 mcg by mouth daily., Disp: , Rfl:    VITAMIN D  PO, Take by mouth., Disp: , Rfl:  Social History   Socioeconomic History   Marital status: Widowed    Spouse name: Not on file   Number of children: 3   Years of education: Not on file   Highest education level: 8th grade  Occupational History   Occupation: Retired   Tobacco Use   Smoking status: Never   Smokeless tobacco: Never  Vaping Use   Vaping status: Never Used  Substance and Sexual Activity   Alcohol use: Not Currently   Drug use: No   Sexual activity:  Not Currently    Birth control/protection: Post-menopausal    Comment: 1st intercourse 15 yo--1 partner  Other Topics Concern   Not on file  Social History Narrative   Lives at home with husband.   Right-handed.   Caffeine use: one cup caffeine some days.   Social Drivers of Corporate investment banker Strain: Low Risk  (02/26/2024)   Overall Financial Resource Strain (CARDIA)    Difficulty of Paying Living Expenses: Not very hard  Food Insecurity: No Food Insecurity (03/03/2024)   Hunger Vital Sign    Worried About Running Out of Food in the Last Year: Never true    Ran Out of Food in the Last Year: Never true  Transportation Needs: No Transportation Needs (03/03/2024)   PRAPARE - Administrator, Civil Service (Medical): No    Lack of Transportation (Non-Medical): No  Physical Activity: Insufficiently Active (10/09/2018)   Exercise Vital Sign    Days of Exercise per Week: 2 days    Minutes of Exercise per Session: 30 min  Stress: No Stress Concern Present (10/09/2018)   [employer] of Occupational Health - Occupational Stress Questionnaire    Feeling of Stress : Not at all  Social Connections: Moderately Isolated (02/24/2024)   Social Connection and Isolation Panel    Frequency of Communication with Friends and Family: More than three times a week    Frequency of Social Gatherings with Friends and Family: More than three times a week    Attends Religious Services: More than 4 times per year    Active Member of [employer] or Organizations: No    Attends Banker Meetings: Never    Marital Status: Widowed  Intimate Partner Violence: Not At Risk (03/03/2024)   Humiliation, Afraid, Rape, and Kick questionnaire    Fear of Current or Ex-Partner: No    Emotionally Abused: No    Physically Abused: No    Sexually Abused: No   Family History  Problem Relation Age of Onset   Heart disease Mother    COPD Father    Asthma Father    Alzheimer's disease Sister     Multiple sclerosis Sister    Heart disease Sister    Diabetes Brother        BKA   Alzheimer's disease Brother    Diabetes Brother    Early death Brother        died in war   Stomach cancer Paternal Aunt    Colon cancer Neg Hx    Kidney disease Neg Hx  Liver disease Neg Hx    Esophageal cancer Neg Hx    Rectal cancer Neg Hx     Objective: Office vital signs reviewed. BP 121/62   Pulse 81   Temp 98.1 F (36.7 C)   Ht 5' 4 (1.626 m)   Wt 148 lb (67.1 kg)   SpO2 92%   BMI 25.40 kg/m   Physical Examination:  General: Awake, alert, well appearing female, No acute distress HEENT: Normal    Neck: No masses palpated. No lymphadenopathy    Ears: Tympanic membranes intact, normal light reflex, no erythema, no bulging    Eyes: PERRLA, extraocular membranes intact, sclera white    Nose: nasal turbinates moist, clear nasal discharge    Throat: moist mucus membranes, no erythema, no tonsillar exudate.  Airway is patent Cardio: regular rate and rhythm, S1S2 heard, no murmurs appreciated Pulm: clear to auscultation bilaterally, no wheezes, rhonchi or rales; normal work of breathing on room air    Assessment/ Plan: 83 y.o. female   Rhinosinusitis - Plan: Veritor Flu A/B Waived, Novel Coronavirus, NAA ([facility]), desloratadine (CLARINEX) 5 MG tablet, benzonatate (TESSALON PERLES) 100 MG capsule  No evidence of bacterial infection. Allergic vs viral. Negative flu.  COVID sent.  Meds to help with drainage sent.  Home care instructions reviewed and reasons for reevaluation discussed.  Zio placed. Patient to register as per cardio instructions   Kimberly CHRISTELLA Fielding, DO [facility] [phone]

## 2024-06-20 NOTE — Patient Instructions (Signed)

## 2024-07-09 ENCOUNTER — Other Ambulatory Visit: Payer: Self-pay | Admitting: Family Medicine

## 2024-07-09 DIAGNOSIS — I4891 Unspecified atrial fibrillation: Secondary | ICD-10-CM

## 2024-07-09 NOTE — Telephone Encounter (Signed)
 Copied from CRM [id number]. Topic: Clinical - Medication Refill >> Jul 09, 2024  9:23 AM Susanna ORN wrote: Medication: apixaban  (ELIQUIS ) 5 MG TABS tablet  Has the patient contacted their pharmacy? No (Agent: If no, request that the patient contact the pharmacy for the refill. If patient does not wish to contact the pharmacy document the reason why and proceed with request.) (Agent: If yes, when and what did the pharmacy advise?)  This is the patient's preferred pharmacy:   [facility] - [place], [place] - [address] [place] [address] Phone: [phone] Fax: [phone]  Is this the correct pharmacy for this prescription? Yes If no, delete pharmacy and type the correct one.   Has the prescription been filled recently? Yes  Is the patient out of the medication? No, states she may have enough to last her until it's delivered.   Has the patient been seen for an appointment in the last year OR does the patient have an upcoming appointment? Yes  Can we respond through MyChart? Yes  Agent: Please be advised that Rx refills may take up to 3 business days. We ask that you follow-up with your pharmacy.

## 2024-07-11 DIAGNOSIS — I480 Paroxysmal atrial fibrillation: Secondary | ICD-10-CM | POA: Diagnosis not present

## 2024-07-11 MED ORDER — APIXABAN 5 MG PO TABS: 5.0000 mg | ORAL_TABLET | Freq: Two times a day (BID) | ORAL | 1 refills | Status: AC

## 2024-07-13 ENCOUNTER — Ambulatory Visit: Payer: Self-pay | Admitting: Cardiology

## 2024-07-27 DIAGNOSIS — I5022 Chronic systolic (congestive) heart failure: Secondary | ICD-10-CM | POA: Insufficient documentation

## 2024-07-27 NOTE — Progress Notes (Unsigned)
 Cardiology Office Note:   Date:  07/30/2024  ID:  Kimberly, Suarez Jul 16, 1941, MRN [id number] PCP: Kimberly Norene HERO, DO  [facility] Providers Cardiologist:  Lynwood Schilling, MD {  History of Present Illness:   Kimberly Suarez is a 83 y.o. female who saw Dr. Sheena for PAF.  She has CKD 3.  She has HTN.  She wore a monitor in 2024 and had PAF 2%.  She had symptoms associated with the atrial fib.   Echo in 2024 demonstrated a NL EF.  In July she had cardiac cath to evaluate acute SOB with a reduced EF in June 2025.   Her EF on echo  was 30 - 35%.     Right heart pressures were normal.  Coronary angiography demonstrated no significant stenosis.     She returns for follow up.   She was seen in the Hypertension Clinic recently.  She is having workup for labile hypertension that has been unrevealing for any secondary causes and ultimately is only on as needed hydralazine  now with well-controlled blood pressures.  She had a monitor in November that demonstrated 7% atrial fibrillation and so I did continue amiodarone  and anticoagulation.  She had an echocardiogram done and the results are back today demonstrating her EF to be up to 45 to 50%.  She is breathing okay.  She was to pick up a prescription for Farxiga  and wanted to talk about this.  She is taking her Lasix  rarely.  She is not using any hydralazine  because she is not having any hypertensive urgency.  She denies any chest pressure, neck or arm discomfort.  She has had no weight gain or edema.  ROS: Positive for cough.  Otherwise as stated in the HPI and negative for all other systems.  Studies Reviewed:    EKG:     NA  Risk Assessment/Calculations:    CHA2DS2-VASc Score = 5   This indicates a 7.2% annual risk of stroke. The patient's score is based upon: CHF History: 0 HTN History: 1 Diabetes History: 0 Stroke History: 0 Vascular Disease History: 1 Age Score: 2 Gender Score: 1   Physical Exam:   VS:  BP 118/68    Pulse 64   Ht 5' 4 (1.626 m)   Wt 144 lb (65.3 kg)   BMI 24.72 kg/m    Wt Readings from Last 3 Encounters:  07/30/24 144 lb (65.3 kg)  07/28/24 143 lb (64.9 kg)  06/20/24 148 lb (67.1 kg)     GEN: Well nourished, well developed in no acute distress NECK: No JVD; No carotid bruits CARDIAC: RRR, no murmurs, rubs, gallops RESPIRATORY: Few scattered coarse crackles and rhonchi ABDOMEN: Soft, non-tender, non-distended EXTREMITIES:  No edema; No deformity   ASSESSMENT AND PLAN:   PAF: The patient does not really feel her atrial fibrillation.  She tolerates anticoagulation.  I am going to continue her amiodarone  and her anticoagulation.  No change in therapy.   Anticoagulation: She tolerates anticoagulation.  No change in therapy.   Carotid stenosis:  The left carotid had 40 - 59% left sided plaque in May 2025.     HTN: Her blood pressure is at target.  No change in therapy.  It does not seem to be as labile as previous.   Cardiomyopathy:   Her ejection fraction has improved.  She has had significant renal insufficiency so I am staying away from ARNI or ARB.  She has had bradycardia so I am avoiding beta-blockers.  She will start Farxiga.     Follow up with me in 4 months.  Signed, Lynwood Schilling, MD

## 2024-07-28 ENCOUNTER — Ambulatory Visit: Payer: Self-pay | Admitting: Cardiology

## 2024-07-28 ENCOUNTER — Encounter (HOSPITAL_BASED_OUTPATIENT_CLINIC_OR_DEPARTMENT_OTHER): Payer: Self-pay | Admitting: Cardiovascular Disease

## 2024-07-28 ENCOUNTER — Ambulatory Visit (HOSPITAL_BASED_OUTPATIENT_CLINIC_OR_DEPARTMENT_OTHER): Admitting: Cardiovascular Disease

## 2024-07-28 ENCOUNTER — Ambulatory Visit (HOSPITAL_COMMUNITY)
Admission: RE | Admit: 2024-07-28 | Discharge: 2024-07-28 | Disposition: A | Discharge status: Home / Self Care | Source: Ambulatory Visit | Attending: Cardiovascular Disease | Admitting: Cardiovascular Disease

## 2024-07-28 VITALS — BP 120/60 | HR 67 | Ht 64.0 in | Wt 143.0 lb

## 2024-07-28 DIAGNOSIS — I252 Old myocardial infarction: Secondary | ICD-10-CM | POA: Diagnosis not present

## 2024-07-28 DIAGNOSIS — I214 Non-ST elevation (NSTEMI) myocardial infarction: Secondary | ICD-10-CM | POA: Diagnosis not present

## 2024-07-28 DIAGNOSIS — I429 Cardiomyopathy, unspecified: Secondary | ICD-10-CM | POA: Diagnosis present

## 2024-07-28 DIAGNOSIS — I358 Other nonrheumatic aortic valve disorders: Secondary | ICD-10-CM | POA: Diagnosis not present

## 2024-07-28 DIAGNOSIS — Z8249 Family history of ischemic heart disease and other diseases of the circulatory system: Secondary | ICD-10-CM | POA: Insufficient documentation

## 2024-07-28 DIAGNOSIS — I5020 Unspecified systolic (congestive) heart failure: Secondary | ICD-10-CM | POA: Insufficient documentation

## 2024-07-28 DIAGNOSIS — E785 Hyperlipidemia, unspecified: Secondary | ICD-10-CM | POA: Insufficient documentation

## 2024-07-28 DIAGNOSIS — I480 Paroxysmal atrial fibrillation: Secondary | ICD-10-CM

## 2024-07-28 DIAGNOSIS — I10 Essential (primary) hypertension: Secondary | ICD-10-CM | POA: Diagnosis not present

## 2024-07-28 DIAGNOSIS — I2510 Atherosclerotic heart disease of native coronary artery without angina pectoris: Secondary | ICD-10-CM | POA: Insufficient documentation

## 2024-07-28 DIAGNOSIS — I4891 Unspecified atrial fibrillation: Secondary | ICD-10-CM | POA: Insufficient documentation

## 2024-07-28 DIAGNOSIS — I361 Nonrheumatic tricuspid (valve) insufficiency: Secondary | ICD-10-CM | POA: Diagnosis not present

## 2024-07-28 DIAGNOSIS — E782 Mixed hyperlipidemia: Secondary | ICD-10-CM | POA: Diagnosis not present

## 2024-07-28 LAB — ECHOCARDIOGRAM COMPLETE
Area-P 1/2: 3.14 cm2
Height: 64 in
S' Lateral: 1.93 cm
Weight: 2288 [oz_av]

## 2024-07-28 MED ORDER — DAPAGLIFLOZIN PROPANEDIOL 10 MG PO TABS: 10.0000 mg | ORAL_TABLET | Freq: Every day | ORAL | 5 refills | Status: AC

## 2024-07-28 NOTE — Progress Notes (Signed)
 Advanced Hypertension Clinic Initial Assessment:    Date:  07/28/2024   ID:  Kimberly Suarez, Kimberly Suarez Dec 15, 1940, MRN [id number]  PCP:  Kimberly Norene HERO, DO  Cardiologist:  Kimberly Schilling, MD  Nephrologist:  Referring MD: Kimberly Norene HERO, DO   CC: Hypertension  History of Present Illness:    Kimberly Suarez is a 83 y.o. female with HFrEF, CKD 3a, hypertension and PAF here to establish care in the Advanced Hypertension Clinic. She initially saw Dr. Sheena for new onset atrial fibrillation.  Echo in 2024 revealed LVEF 60-65%.  She presented with shortness of breath 01/2024 and LVEF was 30-35%.  She underwent LHC/RHC and found to have minimal nonobstructive disease.  She was noted to be in atrial fibrillation.  She wore an ambulatory monitor that showed a 2% atrial fibrillation burden and was symptomatic when in atrial fibrillation.  She was admitted 02/2024 in A-fib with RVR.  She was unable to tolerate metoprolol  due to bradycardia and was started on amiodarone .  Enalapril  was held due to AKI.  She was seen in the advanced heart failure clinic and doing well.  She followed up with Kimberly Finder, NP 03/2024 at which time her blood pressure remained above goal.  Labs were negative for hyperaldosteronism and pheochromocytoma.  She saw Dr. Schilling later that month and blood pressure was better controlled.  She was instructed to take hydralazine  only as needed.  She has had issues with hypotension when taking her blood pressure medicine every day.  Discussed the use of AI scribe software for clinical note transcription with the patient, who gave verbal consent to proceed.  History of Present Illness Ms. Kimberly Suarez notes that her blood pressure has been more stable recently. She brought her blood pressure readings to the appointment and notes that her blood pressure tends to rise in the evenings.  In the morning it averages in the 110s to 120s and then in the evenings it is typically around the 130s to  140s.  She currently takes hydralazine  as needed for blood pressure control and had not needed to take it lately.  She has a history of atrial fibrillation and congestive heart failure. She experiences swelling, particularly in the abdomen and takes Lasix  as needed to manage this. She feels better after taking Lasix .  She denies orthopnea or PND.  She has been experiencing a persistent cough for six weeks, which she describes as deep and productive, with thick, slimy, and sometimes yellowish sputum. She has tried Delsym and Clarinex , but these have not been very effective. She uses Flonase  nasal spray occasionally.  She exercises on Tuesday mornings, primarily doing chair exercises due to knee pain. She receives knee injections every three months, although the last one was not very effective. She has difficulty getting up from a chair and using stairs due to knee pain.  She reports shortness of breath only with strenuous activities. No recent urinary tract infections, although she had one in the past without typical symptoms. She reports that she is kind of on that borderline for diabetic.  Previous antihypertensives:   Past Medical History:  Diagnosis Date   Allergy    Arthritis    ASCUS (atypical squamous cells of undetermined significance) on Pap smear 2011x2   Negative high-risk HPV, normal Pap smears 2012/2013   Atrial fibrillation (HCC)    Atypical nevus 05/02/2007   right back-moderate    Cancer (HCC)    Cataract    Diverticulosis    GERD (gastroesophageal  reflux disease)    Hiatal hernia    Hyperlipemia    Hypertension    IBS (irritable bowel syndrome)    Kidney disease    Lymphocytic colitis 2007   Neuropathy    SCC (squamous cell carcinoma) 01/19/2006   bridge of nose (CX35FU)   SCC (squamous cell carcinoma) 03/08/2017   right inner lower shin (CX35FU)   SCC (squamous cell carcinoma) x 2 02/24/2002   right bridge of nose (Cx35FU),right bridge of nose   Uterine  prolapse    Pessary    Past Surgical History:  Procedure Laterality Date   BREAST BIOPSY     unsure which breast   CATARACT EXTRACTION W/ INTRAOCULAR LENS IMPLANT Bilateral    COLONOSCOPY     RIGHT/LEFT HEART CATH AND CORONARY ANGIOGRAPHY N/A 02/26/2024   Procedure: RIGHT/LEFT HEART CATH AND CORONARY ANGIOGRAPHY;  Surgeon: Kimberly Newman PARAS, MD;  Location: [facility];  Service: Cardiovascular;  Laterality: N/A;    Current Medications: Current Meds  Medication Sig   acetaminophen  (TYLENOL ) 500 MG tablet Take 2 tablets (1,000 mg total) by mouth 3 (three) times daily.   Alpha-Lipoic Acid 600 MG CAPS Take by mouth.   amiodarone  (PACERONE ) 200 MG tablet Take 1 tablet (200 mg total) by mouth daily.   apixaban  (ELIQUIS ) 5 MG TABS tablet Take 1 tablet (5 mg total) by mouth 2 (two) times daily.   atorvastatin  (LIPITOR) 40 MG tablet TAKE 1 TABLET BY MOUTH DAILY   benzonatate  (TESSALON  PERLES) 100 MG capsule Take 1 capsule (100 mg total) by mouth 3 (three) times daily as needed.   dapagliflozin propanediol (FARXIGA) 10 MG TABS tablet Take 1 tablet (10 mg total) by mouth daily before breakfast.   desloratadine  (CLARINEX ) 5 MG tablet Take 1 tablet (5 mg total) by mouth every other day as needed (runny nose/ allergies; renally dosed).   fluticasone  (FLONASE ) 50 MCG/ACT nasal spray Place 1 spray into both nostrils 2 (two) times daily as needed for allergies or rhinitis.   furosemide  (LASIX ) 40 MG tablet Take 1 tablet (40 mg total) by mouth daily as needed, if gains more than 3 pounds in 1 day or 5 pounds in 1 week   gabapentin  (NEURONTIN ) 100 MG capsule TAKE 3 CAPSULES BY MOUTH AT  BEDTIME   Glucosamine-Chondroitin (GLUCOSAMINE CHONDR COMPLEX PO) Take 1 tablet by mouth 2 (two) times daily.   hydrALAZINE  (APRESOLINE ) 10 MG tablet TAKE 1 TABLET BY MOUTH DAILY AS  NEEDED FOR BP GREATER THAN 180   Melatonin 10 MG CAPS Take by mouth at bedtime and may repeat dose one time if needed.   Multiple  Vitamins-Minerals (MULTIVITAMIN ADULT PO) Take 1 tablet by mouth daily.   Omega-3 Fatty Acids (FISH OIL) 1000 MG CAPS Take 1 capsule by mouth daily.   saccharomyces boulardii (FLORASTOR) 250 MG capsule Take 250 mg by mouth 2 (two) times daily.   TURMERIC CURCUMIN PO Take 1,500 mg by mouth 2 (two) times daily.   vitamin B-12 (CYANOCOBALAMIN) 500 MCG tablet Take 500 mcg by mouth daily.   VITAMIN D  PO Take by mouth.     Allergies:   Patient has no known allergies.   Social History   Socioeconomic History   Marital status: Widowed    Spouse name: Not on file   Number of children: 3   Years of education: Not on file   Highest education level: 8th grade  Occupational History   Occupation: Retired   Tobacco Use   Smoking status:  Never   Smokeless tobacco: Never  Vaping Use   Vaping status: Never Used  Substance and Sexual Activity   Alcohol use: Not Currently   Drug use: No   Sexual activity: Not Currently    Birth control/protection: Post-menopausal    Comment: 1st intercourse 43 yo--1 partner  Other Topics Concern   Not on file  Social History Narrative   Lives at home with husband.   Right-handed.   Caffeine use: one cup caffeine some days.   Social Drivers of Corporate Investment Banker Strain: Low Risk  (02/26/2024)   Overall Financial Resource Strain (CARDIA)    Difficulty of Paying Living Expenses: Not very hard  Food Insecurity: No Food Insecurity (03/03/2024)   Hunger Vital Sign    Worried About Running Out of Food in the Last Year: Never true    Ran Out of Food in the Last Year: Never true  Transportation Needs: No Transportation Needs (03/03/2024)   PRAPARE - Administrator, Civil Service (Medical): No    Lack of Transportation (Non-Medical): No  Physical Activity: Insufficiently Active (10/09/2018)   Exercise Vital Sign    Days of Exercise per Week: 2 days    Minutes of Exercise per Session: 30 min  Stress: No Stress Concern Present (10/09/2018)    [employer] of Occupational Health - Occupational Stress Questionnaire    Feeling of Stress : Not at all  Social Connections: Moderately Isolated (02/24/2024)   Social Connection and Isolation Panel    Frequency of Communication with Friends and Family: More than three times a week    Frequency of Social Gatherings with Friends and Family: More than three times a week    Attends Religious Services: More than 4 times per year    Active Member of [employer] or Organizations: No    Attends Banker Meetings: Never    Marital Status: Widowed     Family History: The patient's family history includes Alzheimer's disease in her brother and sister; Asthma in her father; COPD in her father; Diabetes in her brother and brother; Early death in her brother; Heart disease in her mother and sister; Multiple sclerosis in her sister; Stomach cancer in her paternal aunt. There is no history of Colon cancer, Kidney disease, Liver disease, Esophageal cancer, or Rectal cancer.  ROS:   Please see the history of present illness.     All other systems reviewed and are negative.  EKGs/Labs/Other Studies Reviewed:    EKG:  EKG is not ordered today.    Coronary angiography 02/26/2024: LM: No significant disease LAD: Moderate calcification without any significant stenosis Lcx: Large dominant vessel.        Moderate calcification without any significant stenosis RCA: Small non dominant vessel. No significant disease   LVEDP 23 mmHg   Right heart catheterization 02/26/2024: RA: 7 mmHg RV: 38/4 mmHg PA: 39/17 mmHg, mPAP 29 mmHg PCW: 14 mmHg   AO sats: 99% PA sats: 71%   CO: 4.9 L/min CI: 2.9 L/min/m2   Coronary calcification without any significant obstructive coronary artery disease Mildly decompensated nonischemic cardiomyopathy  Recent Labs: 02/23/2024: B Natriuretic Peptide 1,329.6 02/26/2024: Magnesium 1.9 02/27/2024: Hemoglobin 13.0; Platelets 264 04/17/2024: ALT 19 04/22/2024: TSH  1.370 04/29/2024: BUN 32; Creatinine, Ser 1.36; Potassium 4.4; Sodium 134   Recent Lipid Panel    Component Value Date/Time   CHOL 146 10/26/2023 1106   CHOL 138 12/02/2012 0857   TRIG 127 10/26/2023 1106   TRIG 139  10/30/2016 1653   TRIG 102 12/02/2012 0857   HDL 43 10/26/2023 1106   HDL 43 10/30/2016 1653   HDL 50 12/02/2012 0857   CHOLHDL 3.4 10/26/2023 1106   LDLCALC 80 10/26/2023 1106   LDLCALC 69 05/18/2014 0816   LDLCALC 68 12/02/2012 0857   LDLDIRECT 75 04/17/2024 1409    Physical Exam:   VS:  BP 120/60 (BP Location: Right Arm, Patient Position: Sitting, Cuff Size: Normal)   Pulse 67   Ht 5' 4 (1.626 m)   Wt 143 lb (64.9 kg)   SpO2 94%   BMI 24.55 kg/m  , BMI Body mass index is 24.55 kg/m. GENERAL:  Well appearing HEENT: Pupils equal round and reactive, fundi not visualized, oral mucosa unremarkable NECK:  No jugular venous distention, waveform within normal limits, carotid upstroke brisk and symmetric, no bruits, no thyromegaly LUNGS:  Clear to auscultation bilaterally HEART:  RRR.  PMI not displaced or sustained,S1 and S2 within normal limits, no S3, no S4, no clicks, no rubs, no murmurs ABD:  Flat, positive bowel sounds normal in frequency in pitch, no bruits, no rebound, no guarding, no midline pulsatile mass, no hepatomegaly, no splenomegaly EXT:  2 plus pulses throughout, no edema, no cyanosis no clubbing SKIN:  No rashes no nodules NEURO:  Cranial nerves II through XII grossly intact, motor grossly intact throughout PSYCH:  Cognitively intact, oriented to person place and time   ASSESSMENT/PLAN:    Assessment & Plan # Heart failure with reduced ejection fraction (EF 30-35%) Heart failure with reduced EF 30-35%. Occasional swelling managed with Lasix . Echocardiogram scheduled to assess heart function. - Continue Lasix  as needed for fluid management. - She is going for an echocardiogram today to re-assess EF - Provided samples of Farxiga for heart failure  management and kidney protection.  # Paroxysmal atrial fibrillation Managed with amiodarone  and Eliquis . Recent monitoring showed 7% AFib, indicating good control. Discussed potential switch to generic Pradaxa for cost savings. - Continue amiodarone  and Eliquis  for rhythm control and stroke prevention.  Amiodarone  monitorng per primary cardiologist. -She would like to switch to generic Pradaxa for cost savings.  150mg  bid.   #  Primary hypertension Hypertension managed with hydralazine  as needed. Blood pressure rises in the evening but are low to normal in the AM., . Discussed benefits of regular exercise and potential use of Farxiga for blood pressure control. - Continue hydralazine  as needed for elevated blood pressure. - Encouraged regular exercise, including chair exercises and potential water exercises. - Discussed potential use of Farxiga for blood pressure control.  # Chronic kidney disease stage 3a Chronic kidney disease stage 3. Discussed potential benefits of Farxiga for kidney protection. - Start Farxiga for kidney protection.  # Chronic cough Persisting for six weeks with thick, yellowish sputum. Possible sinus drainage contributing to symptoms. - Encouraged regular use of Flonase  nasal spray for sinus drainage. - Continue Delsym for cough relief.  # Osteoarthritis of the knee Osteoarthritis causing significant discomfort, especially when getting up from a chair or using steps. Previous injections provided limited relief. - Encouraged continuation of chair exercises. - Discussed potential benefits of water exercises to reduce joint stress. - Consider use of a pedal exerciser for home exercise.   Screening for Secondary Hypertension:     Relevant Labs/Studies:    Latest Ref Rng & Units 04/29/2024    1:47 PM 04/22/2024    2:17 PM 04/17/2024    2:08 PM  Basic Labs  Sodium 134 - 144 mmol/L  134  135  135   Potassium 3.5 - 5.2 mmol/L 4.4  4.2  4.2   Creatinine 0.57 - 1.00  mg/dL 8.63  8.44  8.65        Latest Ref Rng & Units 04/22/2024    2:17 PM 04/17/2024    2:08 PM  Thyroid    TSH 0.450 - 4.500 uIU/mL 1.370  1.360        Latest Ref Rng & Units 04/17/2024    2:08 PM  Renin/Aldosterone   Aldosterone 0.0 - 30.0 ng/dL 4.0   Aldos/Renin Ratio 0.0 - 30.0 5.1        Latest Ref Rng & Units 04/17/2024    2:08 PM  Metanephrines/Catecholamines   Epinephrine 0.0 - 55.4 pg/mL 11.1   Norepinephrine 115 - 524 pg/mL 755   Dopamine 0.0 - 36.7 pg/mL 15.6   Metanephrines 0.0 - 88.0 pg/mL <25.0   Normetanephrines  0.0 - 297.2 pg/mL 69.4       Disposition:    FU with Dr. Lavona as scheduled.  Me as needed.   Medication Adjustments/Labs and Tests Ordered: Current medicines are reviewed at length with the patient today.  Concerns regarding medicines are outlined above.  No orders of the defined types were placed in this encounter.  Meds ordered this encounter  Medications   dapagliflozin propanediol (FARXIGA) 10 MG TABS tablet    Sig: Take 1 tablet (10 mg total) by mouth daily before breakfast.    Dispense:  30 tablet    Refill:  5     Signed, Annabella Scarce, MD  07/28/2024 3:31 PM    [facility]

## 2024-07-28 NOTE — Patient Instructions (Addendum)
 Medication Instructions:  START FARXIGA  10 MG DAILY   WHEN YOU ARE DOWN TO YOUR LAST 2 TO 3 WEEKS OF ELIQUIS  SEND A MYCHART MESSAGE REQUESTING THE PRADAXA (GENERIC) 150 MG TWICE A DAY BE SENT TO YOUR PHARMACY   Labwork: NONE  Testing/Procedures: NONE  Follow-Up: DR Leeper AS NEEDED

## 2024-07-30 ENCOUNTER — Encounter: Payer: Self-pay | Admitting: Cardiology

## 2024-07-30 ENCOUNTER — Ambulatory Visit: Admitting: Cardiology

## 2024-07-30 ENCOUNTER — Telehealth: Payer: Self-pay | Admitting: Family Medicine

## 2024-07-30 VITALS — BP 118/68 | HR 64 | Ht 64.0 in | Wt 144.0 lb

## 2024-07-30 DIAGNOSIS — I480 Paroxysmal atrial fibrillation: Secondary | ICD-10-CM

## 2024-07-30 DIAGNOSIS — I5022 Chronic systolic (congestive) heart failure: Secondary | ICD-10-CM | POA: Diagnosis not present

## 2024-07-30 DIAGNOSIS — I10 Essential (primary) hypertension: Secondary | ICD-10-CM

## 2024-07-30 DIAGNOSIS — I779 Disorder of arteries and arterioles, unspecified: Secondary | ICD-10-CM | POA: Diagnosis not present

## 2024-07-30 NOTE — Telephone Encounter (Signed)
 Left message to schedule appointment

## 2024-07-30 NOTE — Patient Instructions (Addendum)
 Medication Instructions:   Your physician recommends that you continue on your current medications as directed. Please refer to the Current Medication list given to you today.  Labwork:  none  Testing/Procedures:  none  Follow-Up:  Your physician recommends that you schedule a follow-up appointment in: 4 months.   Any Other Special Instructions Will Be Listed Below (If Applicable).  If you need a refill on your cardiac medications before your next appointment, please call your pharmacy.

## 2024-07-30 NOTE — Telephone Encounter (Unsigned)
 Copied from CRM [id number]. Topic: Clinical - Medication Question >> Jul 30, 2024 12:39 PM Kimberly Suarez wrote: Reason for CRM: patient has been coughing and having congestion and also wheezing for the last 6 weeks and would like to ask if the provider could suggest a medication to help with this concern   Pt num [phone] (H)  [id number] - [place], [place] - [address] [place] [address] Phone: [phone] Fax: [phone]

## 2024-07-31 ENCOUNTER — Ambulatory Visit: Payer: Self-pay

## 2024-07-31 NOTE — Telephone Encounter (Signed)
Noted  -LS

## 2024-07-31 NOTE — Telephone Encounter (Signed)
 FYI Only or Action Required?: FYI only for provider: refused appt today due to plans. Was previously scheduled for 12.10 wants to keep that appt.  Patient was last seen in primary care on 06/20/2024 by Jolinda Norene HERO, DO.  Called Nurse Triage reporting Cough.  Symptoms began several months ago.  Interventions attempted: Nothing.  Symptoms are: unchanged.  Triage Disposition: See HCP Within 4 Hours (Or PCP Triage)  Patient/caregiver understands and will follow disposition?: No, refuses disposition  Copied from CRM #[id number]. Topic: Clinical - Red Word Triage >> Jul 31, 2024 11:15 AM Wess RAMAN wrote: Red Word that prompted transfer to Nurse Triage: Wheezing and coughing for 6 weeks.  Would like sooner appt than 12/10 but can't come in tomorrow due to the weather. Reason for Disposition  [1] MILD difficulty breathing (e.g., minimal/no SOB at rest, SOB with walking, pulse < 100) AND [2] still present when not coughing  Answer Assessment - Initial Assessment Questions Pt saw Cardiologist yesterday who said she needed a follow up with Dr. KANDICE for her cough. She was scheduled for the 10th as of yesterday. Pt states cough x6 weeks, thick yellow mucus, but when she blows her nose it's clear. She states cards doctor told her yesterday he heard some wheezing. She states she also has some gunk in her urine and would like that checked. Denies other urinary symptoms.   RN offered appt today with [facility] Doc [facility] Day- pt declined stating that she has plans this afternoon and would rather see Dr. JUDITHANN Rubin tomorrow due to weather. RN advised would send this message to Dr. KANDICE.   Pt states if Dr. KANDICE needs anything she can call her, she is busy today so she can leave a detailed VM.     1. ONSET: When did the cough begin?      6 weeks ago 2. SEVERITY: How bad is the cough today?      moderate 3. SPUTUM: Describe the color of your sputum (e.g., none, dry cough; clear, white, yellow, green)      Thick yellow 4. DIFFICULTY BREATHING: Are you having difficulty breathing? If Yes, ask: How bad is it? (e.g., mild, moderate, severe)      denies 5. FEVER: Do you have a fever? If Yes, ask: What is your temperature, how was it measured, and when did it start?     denies 6. CARDIAC HISTORY: Do you have any history of heart disease? (e.g., heart attack, congestive heart failure)      chf 7 LUNG HISTORY: Do you have any history of lung disease?  (e.g., pulmonary embolus, asthma, emphysema)     no 8 OTHER SYMPTOMS: Do you have any other symptoms? (e.g., runny nose, wheezing, chest pain)       Cards said he heard some slight wheezing.  Protocols used: Cough - Acute Productive-A-AH

## 2024-08-06 ENCOUNTER — Ambulatory Visit: Payer: Self-pay | Admitting: Family Medicine

## 2024-08-06 ENCOUNTER — Ambulatory Visit (INDEPENDENT_AMBULATORY_CARE_PROVIDER_SITE_OTHER): Admitting: Family Medicine

## 2024-08-06 ENCOUNTER — Encounter: Payer: Self-pay | Admitting: Family Medicine

## 2024-08-06 ENCOUNTER — Ambulatory Visit (INDEPENDENT_AMBULATORY_CARE_PROVIDER_SITE_OTHER)

## 2024-08-06 VITALS — BP 124/57 | HR 71 | Temp 97.9°F | Ht 64.0 in | Wt 144.2 lb

## 2024-08-06 DIAGNOSIS — R051 Acute cough: Secondary | ICD-10-CM

## 2024-08-06 DIAGNOSIS — R052 Subacute cough: Secondary | ICD-10-CM

## 2024-08-06 DIAGNOSIS — Z23 Encounter for immunization: Secondary | ICD-10-CM

## 2024-08-06 DIAGNOSIS — J9801 Acute bronchospasm: Secondary | ICD-10-CM | POA: Diagnosis not present

## 2024-08-06 DIAGNOSIS — N3000 Acute cystitis without hematuria: Secondary | ICD-10-CM | POA: Diagnosis not present

## 2024-08-06 LAB — MICROSCOPIC EXAMINATION: Yeast, UA: NONE SEEN

## 2024-08-06 LAB — URINALYSIS, ROUTINE W REFLEX MICROSCOPIC
Bilirubin, UA: NEGATIVE
Nitrite, UA: NEGATIVE
RBC, UA: NEGATIVE
Specific Gravity, UA: 1.01 (ref 1.005–1.030)
Urobilinogen, Ur: 0.2 mg/dL (ref 0.2–1.0)
pH, UA: 5.5 (ref 5.0–7.5)

## 2024-08-06 MED ORDER — CEPHALEXIN 500 MG PO CAPS: 500.0000 mg | ORAL_CAPSULE | Freq: Two times a day (BID) | ORAL | Status: AC

## 2024-08-06 NOTE — Patient Instructions (Signed)

## 2024-08-06 NOTE — Progress Notes (Signed)
 Subjective: CC: Cough PCP: Jolinda Kimberly HERO, DO Kimberly Suarez is a 83 y.o. female presenting to clinic today for:  Patient here for interval follow-up on cough.  She saw her cardiologist a couple of days ago and they had concerned about bronchospasm and recommended follow-up with PCP.  She reports no hemoptysis.  No postnasal drainage or nighttime cough.  She has used a couple of boxes of Coricidin and does report improvement just not resolution.  No chest pain, shortness of breath, wheezing that she has noticed or fevers reported  She does report that she looked at her bedside urinal and saw some film on top of it and was worried maybe she had a UTI but does not report any dysuria, hematuria, flank pain or fevers.  She has Keflex  that she never had filled from August as well as the Diflucan  and will take it if it is found that she has a UTI today.   ROS: Per HPI  No Known Allergies Past Medical History:  Diagnosis Date   Allergy    Arthritis    ASCUS (atypical squamous cells of undetermined significance) on Pap smear 2011x2   Negative high-risk HPV, normal Pap smears 2012/2013   Atrial fibrillation (HCC)    Atypical nevus 05/02/2007   right back-moderate    Cancer (HCC)    Cataract    Diverticulosis    GERD (gastroesophageal reflux disease)    HFrEF (heart failure with reduced ejection fraction) (HCC) 07/28/2024   Hiatal hernia    Hyperlipemia    Hypertension    IBS (irritable bowel syndrome)    Kidney disease    Lymphocytic colitis 2007   Neuropathy    NSTEMI (non-ST elevated myocardial infarction) (HCC) 02/24/2024   PAF (paroxysmal atrial fibrillation) (HCC) 02/27/2024   SCC (squamous cell carcinoma) 01/19/2006   bridge of nose (CX35FU)   SCC (squamous cell carcinoma) 03/08/2017   right inner lower shin (CX35FU)   SCC (squamous cell carcinoma) x 2 02/24/2002   right bridge of nose (Cx35FU),right bridge of nose   Uterine prolapse    Pessary    Current  Outpatient Medications:    acetaminophen  (TYLENOL ) 500 MG tablet, Take 2 tablets (1,000 mg total) by mouth 3 (three) times daily., Disp: 180 tablet, Rfl: PRN   Alpha-Lipoic Acid 600 MG CAPS, Take by mouth., Disp: , Rfl:    amiodarone  (PACERONE ) 200 MG tablet, Take 1 tablet (200 mg total) by mouth daily., Disp: 90 tablet, Rfl: 3   apixaban  (ELIQUIS ) 5 MG TABS tablet, Take 1 tablet (5 mg total) by mouth 2 (two) times daily., Disp: 100 tablet, Rfl: 1   atorvastatin  (LIPITOR) 40 MG tablet, TAKE 1 TABLET BY MOUTH DAILY, Disp: 100 tablet, Rfl: 1   benzonatate  (TESSALON  PERLES) 100 MG capsule, Take 1 capsule (100 mg total) by mouth 3 (three) times daily as needed., Disp: 20 capsule, Rfl: 0   dapagliflozin  propanediol (FARXIGA ) 10 MG TABS tablet, Take 1 tablet (10 mg total) by mouth daily before breakfast., Disp: 30 tablet, Rfl: 5   desloratadine  (CLARINEX ) 5 MG tablet, Take 1 tablet (5 mg total) by mouth every other day as needed (runny nose/ allergies; renally dosed)., Disp: 45 tablet, Rfl: 3   fluticasone  (FLONASE ) 50 MCG/ACT nasal spray, Place 1 spray into both nostrils 2 (two) times daily as needed for allergies or rhinitis., Disp: 16 g, Rfl: 6   furosemide  (LASIX ) 40 MG tablet, Take 1 tablet (40 mg total) by mouth daily as needed, if  gains more than 3 pounds in 1 day or 5 pounds in 1 week, Disp: 90 tablet, Rfl: 2   gabapentin  (NEURONTIN ) 100 MG capsule, TAKE 3 CAPSULES BY MOUTH AT  BEDTIME, Disp: 300 capsule, Rfl: 1   Glucosamine-Chondroitin (GLUCOSAMINE CHONDR COMPLEX PO), Take 1 tablet by mouth 2 (two) times daily., Disp: , Rfl:    hydrALAZINE  (APRESOLINE ) 10 MG tablet, TAKE 1 TABLET BY MOUTH DAILY AS  NEEDED FOR BP GREATER THAN 180, Disp: 90 tablet, Rfl: 3   Melatonin 10 MG CAPS, Take by mouth at bedtime and may repeat dose one time if needed., Disp: , Rfl:    Multiple Vitamins-Minerals (MULTIVITAMIN ADULT PO), Take 1 tablet by mouth daily., Disp: , Rfl:    Omega-3 Fatty Acids (FISH OIL) 1000 MG  CAPS, Take 1 capsule by mouth daily., Disp: , Rfl:    saccharomyces boulardii (FLORASTOR) 250 MG capsule, Take 250 mg by mouth 2 (two) times daily., Disp: , Rfl:    TURMERIC CURCUMIN PO, Take 1,500 mg by mouth 2 (two) times daily., Disp: , Rfl:    vitamin B-12 (CYANOCOBALAMIN) 500 MCG tablet, Take 500 mcg by mouth daily., Disp: , Rfl:    VITAMIN D  PO, Take by mouth., Disp: , Rfl:  Social History   Socioeconomic History   Marital status: Widowed    Spouse name: Not on file   Number of children: 3   Years of education: Not on file   Highest education level: 8th grade  Occupational History   Occupation: Retired   Tobacco Use   Smoking status: Never   Smokeless tobacco: Never  Vaping Use   Vaping status: Never Used  Substance and Sexual Activity   Alcohol use: Not Currently   Drug use: No   Sexual activity: Not Currently    Birth control/protection: Post-menopausal    Comment: 1st intercourse 15 yo--1 partner  Other Topics Concern   Not on file  Social History Narrative   Lives at home with husband.   Right-handed.   Caffeine use: one cup caffeine some days.   Social Drivers of Corporate Investment Banker Strain: Low Risk  (02/26/2024)   Overall Financial Resource Strain (CARDIA)    Difficulty of Paying Living Expenses: Not very hard  Food Insecurity: No Food Insecurity (03/03/2024)   Hunger Vital Sign    Worried About Running Out of Food in the Last Year: Never true    Ran Out of Food in the Last Year: Never true  Transportation Needs: No Transportation Needs (03/03/2024)   PRAPARE - Administrator, Civil Service (Medical): No    Lack of Transportation (Non-Medical): No  Physical Activity: Insufficiently Active (10/09/2018)   Exercise Vital Sign    Days of Exercise per Week: 2 days    Minutes of Exercise per Session: 30 min  Stress: No Stress Concern Present (10/09/2018)   [employer] of Occupational Health - Occupational Stress Questionnaire    Feeling of  Stress : Not at all  Social Connections: Moderately Isolated (02/24/2024)   Social Connection and Isolation Panel    Frequency of Communication with Friends and Family: More than three times a week    Frequency of Social Gatherings with Friends and Family: More than three times a week    Attends Religious Services: More than 4 times per year    Active Member of [employer] or Organizations: No    Attends Banker Meetings: Never    Marital Status: Widowed  Intimate  Partner Violence: Not At Risk (03/03/2024)   Humiliation, Afraid, Rape, and Kick questionnaire    Fear of Current or Ex-Partner: No    Emotionally Abused: No    Physically Abused: No    Sexually Abused: No   Family History  Problem Relation Age of Onset   Heart disease Mother    COPD Father    Asthma Father    Alzheimer's disease Sister    Multiple sclerosis Sister    Heart disease Sister    Diabetes Brother        BKA   Alzheimer's disease Brother    Diabetes Brother    Early death Brother        died in war   Stomach cancer Paternal Aunt    Colon cancer Neg Hx    Kidney disease Neg Hx    Liver disease Neg Hx    Esophageal cancer Neg Hx    Rectal cancer Neg Hx     Objective: Office vital signs reviewed. BP (!) 124/57   Pulse 71   Temp 97.9 F (36.6 C)   Ht 5' 4 (1.626 m)   Wt 144 lb 4 oz (65.4 kg)   SpO2 93%   BMI 24.76 kg/m   Physical Examination:  General: Awake, alert, well nourished, No acute distress HEENT: sclera white, MMM Cardio: regular rate and rhythm, S1S2 heard, no murmurs appreciated Pulm: clear to auscultation bilaterally, no wheezes, rhonchi or rales; normal work of breathing on room air GU: No CVA tenderness palpation  No results found.   Assessment/ Plan: 83 y.o. female   Subacute cough - Plan: DG Chest 2 View, cephALEXin  (KEFLEX ) 500 MG capsule  Acute cystitis without hematuria - Plan: Urinalysis, Routine w reflex microscopic, cephALEXin  (KEFLEX ) 500 MG capsule   I  cannot appreciate any wheezes or abnormalities on her lung exam today.  Perhaps it cleared with cough?  Chest x-ray obtained given duration of symptoms and upon personal view I did not appreciate any abnormalities.  Awaiting formal review by radiology   however, she does have bacteria on her urinalysis so we will treat with Keflex .  She had this leftover as a 7-day supply from previous evaluation that required change in therapy.  Encourage p.o. hydration.  Follow-up as needed   Kimberly CHRISTELLA Fielding, DO [facility] [phone]

## 2024-08-15 ENCOUNTER — Telehealth (HOSPITAL_BASED_OUTPATIENT_CLINIC_OR_DEPARTMENT_OTHER): Payer: Self-pay | Admitting: *Deleted

## 2024-08-15 NOTE — Telephone Encounter (Signed)
"  ° °  Pre-operative Risk Assessment    Patient Name: Kimberly Suarez  DOB: May 28, 1941 MRN: [id number]   Date of last office visit: 07/30/24 DR. HOCHREIN Date of next office visit: NONE   Request for Surgical Clearance    Procedure:  RIGHT TOTAL KNEE ARTHROPLASTY  Date of Surgery:  Clearance 12/02/24                                Surgeon:  DR. MATTHEW OLIN Surgeon's Group or Practice Name:  [facility] Phone number:  [phone] JOEN [facility] Fax number:  [phone]   Type of Clearance Requested:   - Medical  - Pharmacy:  Hold Apixaban  (Eliquis )     Type of Anesthesia:  Spinal   Additional requests/questions:    [name]   08/15/2024, 12:32 PM   "

## 2024-08-18 NOTE — Telephone Encounter (Signed)
 Patient with diagnosis of A Fib on Eliquis  for anticoagulation.    Procedure: RIGHT TOTAL KNEE ARTHROPLASTY  Date of procedure: 12/02/24   CHA2DS2-VASc Score = 6  This indicates a 9.7% annual risk of stroke. The patient's score is based upon: CHF History: 1 HTN History: 1 Diabetes History: 0 Stroke History: 0 Vascular Disease History: 1 Age Score: 2 Gender Score: 1   CrCl 32 ml/min Platelet count 264K  Patient has not had an Afib/aflutter ablation in the last 3 months, DCCV within the last 4 weeks or a watchman implanted in the last 45 days    Per office protocol, patient can hold Eliquis  for 3 days prior to procedure.     **This guidance is not considered finalized until pre-operative APP has relayed final recommendations.**

## 2024-08-18 NOTE — Telephone Encounter (Signed)
 OV preop clearance appt now scheduled. Pt preferred to see Dr. Lavona in [place] however there were no slots available. Pt agreed to see him here at [facility] in March.

## 2024-08-18 NOTE — Telephone Encounter (Signed)
 Procedure is not until April. Dr. Lavona recently saw this patient in clinic and requested a 4 month follow up. His schedule for March is currently open. Can you please schedule this patient an in-office visit for follow up with Dr. Lavona and put PRE-OP in the notes section. Thanks!

## 2024-09-17 ENCOUNTER — Ambulatory Visit: Payer: Self-pay

## 2024-09-17 NOTE — Telephone Encounter (Signed)
 FYI Only or Action Required?: FYI only for provider: appointment scheduled on 09/24/24.  Patient was last seen in primary care on 08/06/2024 by Jolinda Norene HERO, DO.  Called Nurse Triage reporting Extremity Weakness.  Symptoms began several years ago.  Interventions attempted: Rest, hydration, or home remedies.  Symptoms are: stable.  Triage Disposition: See PCP Within 2 Weeks  Patient/caregiver understands and will follow disposition?: Yes   Reason for Disposition  [1] MILD pain (e.g., does not interfere with normal activities) AND [2] present > 7 days  Answer Assessment - Initial Assessment Questions Pt getting right knee replacement 12/02/24, reports weakness in both legs, trouble getting up from a sitting position and doing stairs. Pt wants to get this evaluated to see if there is any exercises she can be doing to strengthen the muscles before her surgery.   1. ONSET: When did the pain start?      Years  2. LOCATION: Where is the pain located?      Both knees and legs  3. PAIN: How bad is the pain?    (Scale 1-10; or mild, moderate, severe)     Mild, not much pain only with pressure on it  4. CAUSE: What do you think is causing the leg pain?     Unsure  5. OTHER SYMPTOMS: Do you have any other symptoms? (e.g., chest pain, back pain, breathing difficulty, swelling, rash, fever, numbness, weakness)     Weakness  Protocols used: Leg Pain-A-AH  Message from Mia F sent at 09/17/2024 11:31 AM EST  Reason for Triage: Weakness in both legs. She says she has a hard time getting up. She says the weakness has been getting worse. She is asking for a MRI or a scan to see if she lost any muscle.

## 2024-09-17 NOTE — Telephone Encounter (Signed)
 Noted. Patient reports mild leg pain when pressure is put on her legs. Onset for years. Patient scheduled to see PCP for this on 1/28.

## 2024-09-24 ENCOUNTER — Encounter: Payer: Self-pay | Admitting: Family Medicine

## 2024-09-24 ENCOUNTER — Other Ambulatory Visit

## 2024-09-24 ENCOUNTER — Ambulatory Visit (INDEPENDENT_AMBULATORY_CARE_PROVIDER_SITE_OTHER): Admitting: Family Medicine

## 2024-09-24 VITALS — BP 152/80 | HR 105 | Temp 97.8°F | Ht 64.0 in | Wt 146.4 lb

## 2024-09-24 DIAGNOSIS — R8290 Unspecified abnormal findings in urine: Secondary | ICD-10-CM | POA: Diagnosis not present

## 2024-09-24 DIAGNOSIS — M170 Bilateral primary osteoarthritis of knee: Secondary | ICD-10-CM

## 2024-09-24 DIAGNOSIS — N1831 Chronic kidney disease, stage 3a: Secondary | ICD-10-CM | POA: Diagnosis not present

## 2024-09-24 DIAGNOSIS — H6122 Impacted cerumen, left ear: Secondary | ICD-10-CM | POA: Diagnosis not present

## 2024-09-24 DIAGNOSIS — I480 Paroxysmal atrial fibrillation: Secondary | ICD-10-CM

## 2024-09-24 LAB — URINALYSIS, ROUTINE W REFLEX MICROSCOPIC
Bilirubin, UA: NEGATIVE
Ketones, UA: NEGATIVE
Nitrite, UA: NEGATIVE
Protein,UA: NEGATIVE
Urobilinogen, Ur: 0.2 mg/dL (ref 0.2–1.0)
pH, UA: 5.5 (ref 5.0–7.5)

## 2024-09-24 LAB — MICROSCOPIC EXAMINATION
Bacteria, UA: NONE SEEN
Renal Epithel, UA: NONE SEEN /HPF
Yeast, UA: NONE SEEN

## 2024-09-24 NOTE — Progress Notes (Signed)
 "  Subjective: CC: Leg weakness PCP: Jolinda Norene HERO, DO Kimberly Suarez is a 84 y.o. female who is accompanied today's visit by her son.  She is presenting to clinic today for:  Patient reports of bilateral leg weakness and she attributes these to bone-on-bone knee arthritis.  She is wondering if there is any way to measure how much muscle she has lost over the last 3 years as she does not ambulate as much as she used to and spends a lot of time sitting.  When she does ambulate utilizes cane or has assistance.  She does not report any falls.  She is scheduled to have a right sided knee replacement in April but notes that she is somewhat nervous to undergo this.  Regarding her atrial fibrillation and CKD, she is compliant with medications and denies any vaginitis, bleeding etc.  She does report that the medications are quite costly at over $160 after the new year.  However, after that they may go down to $47 apiece.  She wonders if maybe there is an alternative to Eliquis .  She read that Plavix had a generic and wants to know if this is something that she could use instead.  Additionally would like to have her ear looked at because she feels like she has something in the left side.   ROS: Per HPI  Allergies[1] Past Medical History:  Diagnosis Date   Allergy    Arthritis    ASCUS (atypical squamous cells of undetermined significance) on Pap smear 2011x2   Negative high-risk HPV, normal Pap smears 2012/2013   Atrial fibrillation (HCC)    Atypical nevus 05/02/2007   right back-moderate    Cancer (HCC)    Cataract    Diverticulosis    GERD (gastroesophageal reflux disease)    HFrEF (heart failure with reduced ejection fraction) (HCC) 07/28/2024   Hiatal hernia    Hyperlipemia    Hypertension    IBS (irritable bowel syndrome)    Kidney disease    Lymphocytic colitis 2007   Neuropathy    NSTEMI (non-ST elevated myocardial infarction) (HCC) 02/24/2024   PAF (paroxysmal atrial  fibrillation) (HCC) 02/27/2024   SCC (squamous cell carcinoma) 01/19/2006   bridge of nose (CX35FU)   SCC (squamous cell carcinoma) 03/08/2017   right inner lower shin (CX35FU)   SCC (squamous cell carcinoma) x 2 02/24/2002   right bridge of nose (Cx35FU),right bridge of nose   Uterine prolapse    Pessary   Current Medications[2] Social History   Socioeconomic History   Marital status: Widowed    Spouse name: Not on file   Number of children: 3   Years of education: Not on file   Highest education level: 8th grade  Occupational History   Occupation: Retired   Tobacco Use   Smoking status: Never   Smokeless tobacco: Never  Vaping Use   Vaping status: Never Used  Substance and Sexual Activity   Alcohol use: Not Currently   Drug use: No   Sexual activity: Not Currently    Birth control/protection: Post-menopausal    Comment: 1st intercourse 15 yo--1 partner  Other Topics Concern   Not on file  Social History Narrative   Lives at home with husband.   Right-handed.   Caffeine use: one cup caffeine some days.   Social Drivers of Health   Tobacco Use: Low Risk (09/24/2024)   Patient History    Smoking Tobacco Use: Never    Smokeless Tobacco Use: Never  Passive Exposure: Not on file  Financial Resource Strain: Low Risk (02/26/2024)   Overall Financial Resource Strain (CARDIA)    Difficulty of Paying Living Expenses: Not very hard  Food Insecurity: No Food Insecurity (03/03/2024)   Epic    Worried About Programme Researcher, Broadcasting/film/video in the Last Year: Never true    Ran Out of Food in the Last Year: Never true  Transportation Needs: No Transportation Needs (03/03/2024)   Epic    Lack of Transportation (Medical): No    Lack of Transportation (Non-Medical): No  Physical Activity: Not on file  Stress: Not on file  Social Connections: Moderately Isolated (02/24/2024)   Social Connection and Isolation Panel    Frequency of Communication with Friends and Family: More than three times a  week    Frequency of Social Gatherings with Friends and Family: More than three times a week    Attends Religious Services: More than 4 times per year    Active Member of [employer] or Organizations: No    Attends Banker Meetings: Never    Marital Status: Widowed  Intimate Partner Violence: Not At Risk (03/03/2024)   Epic    Fear of Current or Ex-Partner: No    Emotionally Abused: No    Physically Abused: No    Sexually Abused: No  Depression (PHQ2-9): Low Risk (09/24/2024)   Depression (PHQ2-9)    PHQ-2 Score: 4  Alcohol Screen: Low Risk (02/26/2024)   Alcohol Screen    Last Alcohol Screening Score (AUDIT): 0  Housing: Low Risk (03/03/2024)   Epic    Unable to Pay for Housing in the Last Year: No    Number of Times Moved in the Last Year: 0    Homeless in the Last Year: No  Utilities: Not At Risk (03/03/2024)   Epic    Threatened with loss of utilities: No  Health Literacy: Not on file   Family History  Problem Relation Age of Onset   Heart disease Mother    COPD Father    Asthma Father    Alzheimer's disease Sister    Multiple sclerosis Sister    Heart disease Sister    Diabetes Brother        BKA   Alzheimer's disease Brother    Diabetes Brother    Early death Brother        died in war   Stomach cancer Paternal Aunt    Colon cancer Neg Hx    Kidney disease Neg Hx    Liver disease Neg Hx    Esophageal cancer Neg Hx    Rectal cancer Neg Hx     Objective: Office vital signs reviewed. BP (!) 152/80   Pulse (!) 105   Temp 97.8 F (36.6 C)   Ht 5' 4 (1.626 m)   Wt 146 lb 6 oz (66.4 kg)   SpO2 96%   BMI 25.13 kg/m   Physical Examination:  General: Awake, alert, nontoxic-appearing female, No acute distress HEENT: Left TM obscured by cerumen. Cardio: Regular rate and regular rhythm  Pulm:   normal work of breathing on room air MSK: Bilateral osteoarthritic changes in the knees.  Ambulates with use of assistive device.  Has to use bilateral upper  extremities to propel herself out of a seated position  Assessment/ Plan: 84 y.o. female   Bilateral primary osteoarthritis of knee  Impacted cerumen of left ear  Cloudy urine - Plan: Urinalysis, Routine w reflex microscopic, Urinalysis, Routine w reflex microscopic  Stage 3a chronic kidney disease (HCC) - Plan: AMB Referral [facility] Care [facility]  Paroxysmal atrial fibrillation (HCC)   Discussed the risk versus benefits of undergoing surgery.  We discussed that continued strengthening will be essential because she is lead a sedentary lifestyle for the last several years in the setting of uncontrolled pain in her knees.  Her cerumen impaction was irrigated today  Urinalysis without any evidence of UTI.  I have placed a referral to [facility] for assistance with Farxiga   We discussed the risk versus benefit of Coumadin and for now she wants to continue the Eliquis    Kimberly Suarez CHRISTELLA Fielding, DO [facility] [phone]     [1] No Known Allergies [2]  Current Outpatient Medications:    acetaminophen  (TYLENOL ) 500 MG tablet, Take 2 tablets (1,000 mg total) by mouth 3 (three) times daily., Disp: 180 tablet, Rfl: PRN   Alpha-Lipoic Acid 600 MG CAPS, Take by mouth., Disp: , Rfl:    amiodarone  (PACERONE ) 200 MG tablet, Take 1 tablet (200 mg total) by mouth daily., Disp: 90 tablet, Rfl: 3   apixaban  (ELIQUIS ) 5 MG TABS tablet, Take 1 tablet (5 mg total) by mouth 2 (two) times daily., Disp: 100 tablet, Rfl: 1   atorvastatin  (LIPITOR) 40 MG tablet, TAKE 1 TABLET BY MOUTH DAILY, Disp: 100 tablet, Rfl: 1   dapagliflozin  propanediol (FARXIGA ) 10 MG TABS tablet, Take 1 tablet (10 mg total) by mouth daily before breakfast., Disp: 30 tablet, Rfl: 5   desloratadine  (CLARINEX ) 5 MG tablet, Take 1 tablet (5 mg total) by mouth every other day as needed (runny nose/ allergies; renally dosed)., Disp: 45 tablet, Rfl: 3   fluticasone  (FLONASE ) 50 MCG/ACT nasal spray, Place 1 spray into  both nostrils 2 (two) times daily as needed for allergies or rhinitis., Disp: 16 g, Rfl: 6   furosemide  (LASIX ) 40 MG tablet, Take 1 tablet (40 mg total) by mouth daily as needed, if gains more than 3 pounds in 1 day or 5 pounds in 1 week, Disp: 90 tablet, Rfl: 2   gabapentin  (NEURONTIN ) 100 MG capsule, TAKE 3 CAPSULES BY MOUTH AT  BEDTIME, Disp: 300 capsule, Rfl: 1   Glucosamine-Chondroitin (GLUCOSAMINE CHONDR COMPLEX PO), Take 1 tablet by mouth 2 (two) times daily., Disp: , Rfl:    hydrALAZINE  (APRESOLINE ) 10 MG tablet, TAKE 1 TABLET BY MOUTH DAILY AS  NEEDED FOR BP GREATER THAN 180, Disp: 90 tablet, Rfl: 3   Multiple Vitamins-Minerals (MULTIVITAMIN ADULT PO), Take 1 tablet by mouth daily., Disp: , Rfl:    Omega-3 Fatty Acids (FISH OIL) 1000 MG CAPS, Take 1 capsule by mouth daily., Disp: , Rfl:    saccharomyces boulardii (FLORASTOR) 250 MG capsule, Take 250 mg by mouth 2 (two) times daily., Disp: , Rfl:    TURMERIC CURCUMIN PO, Take 1,500 mg by mouth 2 (two) times daily., Disp: , Rfl:    vitamin B-12 (CYANOCOBALAMIN) 500 MCG tablet, Take 500 mcg by mouth daily., Disp: , Rfl:    VITAMIN D  PO, Take by mouth., Disp: , Rfl:    benzonatate  (TESSALON  PERLES) 100 MG capsule, Take 1 capsule (100 mg total) by mouth 3 (three) times daily as needed. (Patient not taking: Reported on 09/24/2024), Disp: 20 capsule, Rfl: 0   Melatonin 10 MG CAPS, Take by mouth at bedtime and may repeat dose one time if needed. (Patient not taking: Reported on 09/24/2024), Disp: , Rfl:   "

## 2024-09-25 ENCOUNTER — Telehealth: Payer: Self-pay

## 2024-09-25 ENCOUNTER — Ambulatory Visit: Payer: Self-pay | Admitting: Family Medicine

## 2024-09-25 NOTE — Progress Notes (Signed)
 Care Guide Pharmacy Note  09/25/2024 Name: Kimberly Suarez MRN: [id number] DOB: July 01, 1941  Referred By: Jolinda Norene HERO, DO Reason for referral: Complex Care Management (Outreach to schedule with Pharm d )   Kimberly Suarez is a 84 y.o. year old female who is a primary care patient of Jolinda Norene HERO, DO.  Kimberly Suarez was referred to the pharmacist for assistance related to: CKD Stage 3  Successful contact was made with the patient to discuss pharmacy services including being ready for the pharmacist to call at least 5 minutes before the scheduled appointment time and to have medication bottles and any blood pressure readings ready for review. The patient agreed to meet with the pharmacist via telephone visit on (date/time).10/30/2024  Jeoffrey Buffalo , RMA     [place]  [facility] Guide  Direct Dial: [phone]  Website: [email or web address]

## 2024-09-30 ENCOUNTER — Ambulatory Visit

## 2024-10-22 ENCOUNTER — Ambulatory Visit

## 2024-10-30 ENCOUNTER — Other Ambulatory Visit

## 2024-11-13 ENCOUNTER — Ambulatory Visit: Payer: Self-pay | Admitting: Cardiology

## 2024-12-02 ENCOUNTER — Ambulatory Visit (HOSPITAL_COMMUNITY): Admit: 2024-12-02 | Payer: Self-pay | Admitting: Orthopedic Surgery

## 2024-12-16 ENCOUNTER — Encounter: Payer: Self-pay | Admitting: Family Medicine

## 2025-04-09 ENCOUNTER — Ambulatory Visit
